# Patient Record
Sex: Male | Born: 1955
Health system: Southern US, Community
[De-identification: ages and names within clinical notes are randomized; demographics above are authoritative.]

## PROBLEM LIST (undated history)

## (undated) DIAGNOSIS — C679 Malignant neoplasm of bladder, unspecified: Secondary | ICD-10-CM

## (undated) DIAGNOSIS — Z87898 Personal history of other specified conditions: Secondary | ICD-10-CM

## (undated) DIAGNOSIS — I77810 Thoracic aortic ectasia: Secondary | ICD-10-CM

## (undated) DIAGNOSIS — I255 Ischemic cardiomyopathy: Secondary | ICD-10-CM

## (undated) DIAGNOSIS — I456 Pre-excitation syndrome: Secondary | ICD-10-CM

## (undated) DIAGNOSIS — T4145XA Adverse effect of unspecified anesthetic, initial encounter: Secondary | ICD-10-CM

## (undated) DIAGNOSIS — T7840XA Allergy, unspecified, initial encounter: Secondary | ICD-10-CM

## (undated) DIAGNOSIS — R972 Elevated prostate specific antigen [PSA]: Secondary | ICD-10-CM

## (undated) DIAGNOSIS — Z8709 Personal history of other diseases of the respiratory system: Secondary | ICD-10-CM

## (undated) DIAGNOSIS — R51 Headache: Secondary | ICD-10-CM

## (undated) DIAGNOSIS — N400 Enlarged prostate without lower urinary tract symptoms: Secondary | ICD-10-CM

## (undated) DIAGNOSIS — I219 Acute myocardial infarction, unspecified: Secondary | ICD-10-CM

## (undated) DIAGNOSIS — K219 Gastro-esophageal reflux disease without esophagitis: Secondary | ICD-10-CM

## (undated) DIAGNOSIS — E785 Hyperlipidemia, unspecified: Secondary | ICD-10-CM

## (undated) DIAGNOSIS — N390 Urinary tract infection, site not specified: Secondary | ICD-10-CM

## (undated) DIAGNOSIS — I2510 Atherosclerotic heart disease of native coronary artery without angina pectoris: Secondary | ICD-10-CM

## (undated) DIAGNOSIS — I82409 Acute embolism and thrombosis of unspecified deep veins of unspecified lower extremity: Secondary | ICD-10-CM

## (undated) DIAGNOSIS — E119 Type 2 diabetes mellitus without complications: Secondary | ICD-10-CM

## (undated) DIAGNOSIS — M1990 Unspecified osteoarthritis, unspecified site: Secondary | ICD-10-CM

## (undated) DIAGNOSIS — Z720 Tobacco use: Secondary | ICD-10-CM

## (undated) DIAGNOSIS — D696 Thrombocytopenia, unspecified: Secondary | ICD-10-CM

## (undated) DIAGNOSIS — I10 Essential (primary) hypertension: Secondary | ICD-10-CM

## (undated) HISTORY — PX: VASECTOMY: SHX75

## (undated) HISTORY — DX: Thrombocytopenia, unspecified: D69.6

## (undated) HISTORY — DX: Allergy, unspecified, initial encounter: T78.40XA

## (undated) HISTORY — PX: BLADDER TUMOR EXCISION: SHX238

## (undated) HISTORY — PX: TRANSTHORACIC ECHOCARDIOGRAM: SHX275

## (undated) HISTORY — PX: LUMBAR SPINE SURGERY: SHX701

## (undated) HISTORY — DX: Tobacco use: Z72.0

## (undated) HISTORY — PX: COLONOSCOPY: SHX174

## (undated) HISTORY — DX: Benign prostatic hyperplasia without lower urinary tract symptoms: N40.0

## (undated) HISTORY — DX: Elevated prostate specific antigen (PSA): R97.20

## (undated) HISTORY — DX: Pre-excitation syndrome: I45.6

## (undated) HISTORY — DX: Malignant neoplasm of bladder, unspecified: C67.9

## (undated) HISTORY — DX: Type 2 diabetes mellitus without complications: E11.9

## (undated) HISTORY — DX: Essential (primary) hypertension: I10

## (undated) HISTORY — DX: Acute embolism and thrombosis of unspecified deep veins of unspecified lower extremity: I82.409

## (undated) HISTORY — DX: Hyperlipidemia, unspecified: E78.5

## (undated) HISTORY — DX: Atherosclerotic heart disease of native coronary artery without angina pectoris: I25.10

---

## 1999-08-09 ENCOUNTER — Inpatient Hospital Stay (HOSPITAL_COMMUNITY): Admission: EM | Admit: 1999-08-09 | Discharge: 1999-08-10 | Payer: Self-pay | Admitting: Emergency Medicine

## 1999-08-09 ENCOUNTER — Encounter: Payer: Self-pay | Admitting: *Deleted

## 1999-08-10 ENCOUNTER — Encounter: Payer: Self-pay | Admitting: Cardiology

## 2002-04-02 ENCOUNTER — Encounter: Payer: Self-pay | Admitting: Orthopedic Surgery

## 2002-04-02 ENCOUNTER — Encounter: Admission: RE | Admit: 2002-04-02 | Discharge: 2002-04-02 | Payer: Self-pay | Admitting: Orthopedic Surgery

## 2003-07-26 HISTORY — PX: CARDIAC CATHETERIZATION: SHX172

## 2004-02-28 ENCOUNTER — Inpatient Hospital Stay (HOSPITAL_COMMUNITY): Admission: EM | Admit: 2004-02-28 | Discharge: 2004-03-01 | Payer: Self-pay | Admitting: Emergency Medicine

## 2004-06-02 ENCOUNTER — Ambulatory Visit: Payer: Self-pay | Admitting: Cardiology

## 2004-06-10 ENCOUNTER — Ambulatory Visit: Payer: Self-pay

## 2004-09-28 ENCOUNTER — Ambulatory Visit: Payer: Self-pay | Admitting: Cardiology

## 2005-02-24 ENCOUNTER — Ambulatory Visit: Payer: Self-pay | Admitting: Cardiology

## 2005-03-01 ENCOUNTER — Ambulatory Visit: Payer: Self-pay | Admitting: Cardiology

## 2005-03-08 ENCOUNTER — Ambulatory Visit: Payer: Self-pay

## 2005-05-11 ENCOUNTER — Ambulatory Visit: Payer: Self-pay | Admitting: Cardiology

## 2005-10-26 ENCOUNTER — Ambulatory Visit: Payer: Self-pay | Admitting: Cardiology

## 2005-11-04 ENCOUNTER — Ambulatory Visit: Payer: Self-pay

## 2006-08-21 ENCOUNTER — Ambulatory Visit: Payer: Self-pay | Admitting: Cardiology

## 2006-08-21 LAB — CONVERTED CEMR LAB
ALT: 34 units/L (ref 0–40)
AST: 26 units/L (ref 0–37)
Albumin: 4.3 g/dL (ref 3.5–5.2)
Alkaline Phosphatase: 47 units/L (ref 39–117)
BUN: 20 mg/dL (ref 6–23)
CO2: 27 meq/L (ref 19–32)
Calcium: 9.4 mg/dL (ref 8.4–10.5)
Chloride: 106 meq/L (ref 96–112)
Cholesterol: 106 mg/dL (ref 0–200)
Creatinine, Ser: 1.2 mg/dL (ref 0.4–1.5)
GFR calc Af Amer: 82 mL/min
GFR calc non Af Amer: 68 mL/min
Glucose, Bld: 129 mg/dL — ABNORMAL HIGH (ref 70–99)
HDL: 26.8 mg/dL — ABNORMAL LOW (ref >39.0)
LDL Cholesterol: 50 mg/dL (ref 0–99)
Potassium: 4.1 meq/L (ref 3.5–5.1)
Sodium: 138 meq/L (ref 135–145)
Total Bilirubin: 0.6 mg/dL (ref 0.3–1.2)
Total CHOL/HDL Ratio: 4
Total Protein: 7 g/dL (ref 6.0–8.3)
Triglycerides: 147 mg/dL (ref 0–149)
VLDL: 29 mg/dL (ref 0–40)

## 2007-07-26 HISTORY — PX: OTHER SURGICAL HISTORY: SHX169

## 2007-09-04 ENCOUNTER — Ambulatory Visit: Payer: Self-pay | Admitting: Cardiology

## 2007-09-12 ENCOUNTER — Ambulatory Visit: Payer: Self-pay

## 2007-09-12 LAB — CONVERTED CEMR LAB
ALT: 39 units/L (ref 0–53)
AST: 27 units/L (ref 0–37)
Albumin: 4.2 g/dL (ref 3.5–5.2)
Alkaline Phosphatase: 47 units/L (ref 39–117)
BUN: 16 mg/dL (ref 6–23)
Bilirubin, Direct: 0.2 mg/dL (ref 0.0–0.3)
CO2: 25 meq/L (ref 19–32)
Calcium: 9.7 mg/dL (ref 8.4–10.5)
Chloride: 103 meq/L (ref 96–112)
Cholesterol: 117 mg/dL (ref 0–200)
Creatinine, Ser: 1.1 mg/dL (ref 0.4–1.5)
GFR calc Af Amer: 90 mL/min
GFR calc non Af Amer: 75 mL/min
Glucose, Bld: 156 mg/dL — ABNORMAL HIGH (ref 70–99)
HDL: 25.8 mg/dL — ABNORMAL LOW (ref >39.0)
LDL Cholesterol: 52 mg/dL (ref 0–99)
Potassium: 4.2 meq/L (ref 3.5–5.1)
Sodium: 137 meq/L (ref 135–145)
Total Bilirubin: 1.7 mg/dL — ABNORMAL HIGH (ref 0.3–1.2)
Total CHOL/HDL Ratio: 4.5
Total Protein: 6.9 g/dL (ref 6.0–8.3)
Triglycerides: 196 mg/dL — ABNORMAL HIGH (ref 0–149)
VLDL: 39 mg/dL (ref 0–40)

## 2008-07-25 HISTORY — PX: COLONOSCOPY: SHX174

## 2008-09-08 ENCOUNTER — Ambulatory Visit: Payer: Self-pay | Admitting: Cardiology

## 2008-09-08 LAB — CONVERTED CEMR LAB
ALT: 22 units/L (ref 0–53)
AST: 17 units/L (ref 0–37)
Albumin: 4 g/dL (ref 3.5–5.2)
Alkaline Phosphatase: 46 units/L (ref 39–117)
BUN: 16 mg/dL (ref 6–23)
Bilirubin, Direct: 0.1 mg/dL (ref 0.0–0.3)
CO2: 26 meq/L (ref 19–32)
Calcium: 10 mg/dL (ref 8.4–10.5)
Chloride: 97 meq/L (ref 96–112)
Cholesterol: 113 mg/dL (ref 0–200)
Creatinine, Ser: 1 mg/dL (ref 0.4–1.5)
GFR calc Af Amer: 101 mL/min
GFR calc non Af Amer: 83 mL/min
Glucose, Bld: 293 mg/dL — ABNORMAL HIGH (ref 70–99)
HDL: 27.1 mg/dL — ABNORMAL LOW (ref >39.0)
LDL Cholesterol: 63 mg/dL (ref 0–99)
Potassium: 4.6 meq/L (ref 3.5–5.1)
Sodium: 135 meq/L (ref 135–145)
Total Bilirubin: 0.7 mg/dL (ref 0.3–1.2)
Total CHOL/HDL Ratio: 4.2
Total Protein: 7.3 g/dL (ref 6.0–8.3)
Triglycerides: 114 mg/dL (ref 0–149)
VLDL: 23 mg/dL (ref 0–40)

## 2008-09-11 DIAGNOSIS — I2510 Atherosclerotic heart disease of native coronary artery without angina pectoris: Secondary | ICD-10-CM | POA: Insufficient documentation

## 2008-09-11 DIAGNOSIS — E785 Hyperlipidemia, unspecified: Secondary | ICD-10-CM | POA: Insufficient documentation

## 2008-09-11 DIAGNOSIS — I10 Essential (primary) hypertension: Secondary | ICD-10-CM | POA: Insufficient documentation

## 2008-09-11 DIAGNOSIS — F17200 Nicotine dependence, unspecified, uncomplicated: Secondary | ICD-10-CM | POA: Insufficient documentation

## 2008-09-11 DIAGNOSIS — E7800 Pure hypercholesterolemia, unspecified: Secondary | ICD-10-CM | POA: Insufficient documentation

## 2009-07-21 ENCOUNTER — Encounter: Payer: Self-pay | Admitting: Cardiology

## 2009-10-07 ENCOUNTER — Ambulatory Visit: Payer: Self-pay | Admitting: Cardiology

## 2010-02-01 ENCOUNTER — Encounter (INDEPENDENT_AMBULATORY_CARE_PROVIDER_SITE_OTHER): Payer: Self-pay | Admitting: *Deleted

## 2010-02-15 ENCOUNTER — Telehealth: Payer: Self-pay | Admitting: Cardiology

## 2010-02-18 ENCOUNTER — Ambulatory Visit: Payer: Self-pay | Admitting: Cardiology

## 2010-02-19 ENCOUNTER — Encounter (INDEPENDENT_AMBULATORY_CARE_PROVIDER_SITE_OTHER): Payer: Self-pay | Admitting: *Deleted

## 2010-02-19 LAB — CONVERTED CEMR LAB
ALT: 26 units/L (ref 0–53)
AST: 23 units/L (ref 0–37)
Albumin: 4.4 g/dL (ref 3.5–5.2)
Alkaline Phosphatase: 53 units/L (ref 39–117)
BUN: 18 mg/dL (ref 6–23)
Bilirubin, Direct: 0.2 mg/dL (ref 0.0–0.3)
CO2: 23 meq/L (ref 19–32)
Calcium: 8.9 mg/dL (ref 8.4–10.5)
Chloride: 100 meq/L (ref 96–112)
Cholesterol: 139 mg/dL (ref 0–200)
Creatinine, Ser: 1 mg/dL (ref 0.4–1.5)
GFR calc non Af Amer: 81.6 mL/min (ref >60)
Glucose, Bld: 125 mg/dL — ABNORMAL HIGH (ref 70–99)
HDL: 28.9 mg/dL — ABNORMAL LOW (ref >39.00)
LDL Cholesterol: 78 mg/dL (ref 0–99)
Potassium: 4.2 meq/L (ref 3.5–5.1)
Sodium: 138 meq/L (ref 135–145)
Total Bilirubin: 1 mg/dL (ref 0.3–1.2)
Total CHOL/HDL Ratio: 5
Total Protein: 6.9 g/dL (ref 6.0–8.3)
Triglycerides: 160 mg/dL — ABNORMAL HIGH (ref 0.0–149.0)
VLDL: 32 mg/dL (ref 0.0–40.0)

## 2010-06-05 ENCOUNTER — Encounter: Payer: Self-pay | Admitting: Cardiology

## 2010-07-25 DIAGNOSIS — C679 Malignant neoplasm of bladder, unspecified: Secondary | ICD-10-CM

## 2010-07-25 HISTORY — DX: Malignant neoplasm of bladder, unspecified: C67.9

## 2010-07-25 HISTORY — PX: OTHER SURGICAL HISTORY: SHX169

## 2010-08-16 ENCOUNTER — Encounter: Payer: Self-pay | Admitting: Cardiology

## 2010-08-24 NOTE — Progress Notes (Signed)
Summary: calling regarding labs   Phone Note Call from Patient Call back at Work Phone [phone]   Caller: Patient Summary of Call: Pt calling regarding labs Initial call taken by: Judie Grieve,  February 15, 2010 11:18 AM  Follow-up for Phone Call        spoke with pt, he will have done this week Deliah Goody, RN  February 15, 2010 11:55 AM

## 2010-08-24 NOTE — Letter (Signed)
Summary: Custom - Lipid  [facility], Main Office  [address]   [place], [place] [address]   Phone: [phone]  Fax: [phone]     February 19, 2010 MRN: [id number]   Mark Blankenship [address] [place], [place]  [address]   Dear Mr. Howdeshell,  We have reviewed your cholesterol results.  They are as follows:     Total Cholesterol:    139 (Desirable: less than 200)       HDL  Cholesterol:     28.90  (Desirable: greater than 40 for men and 50 for women)       LDL Cholesterol:       78  (Desirable: less than 100 for low risk and less than 70 for moderate to high risk)       Triglycerides:       160.0  (Desirable: less than 150)  Our recommendations include:These numbers look good. Continue on the same medicine. Sodium, potassium, kidney and Liver function is normal. Take care, Dr. Darel Hong.THANK YOU SO MUCH FOR THE VEGETABLES, THEY WERE GREAT!    Call our office at the number listed above if you have any questions.  Lowering your LDL cholesterol is important, but it is only one of a large number of "risk factors" that may indicate that you are at risk for heart disease, stroke or other complications of hardening of the arteries.  Other risk factors include:   A.  Cigarette Smoking* B.  High Blood Pressure* C.  Obesity* D.   Low HDL Cholesterol (see yours above)* E.   Diabetes Mellitus (higher risk if your is uncontrolled) F.  Family history of premature heart disease G.  Previous history of stroke or cardiovascular disease    *These are risk factors YOU HAVE CONTROL OVER.  For more information, visit .  There is now evidence that lowering the TOTAL CHOLESTEROL AND LDL CHOLESTEROL can reduce the risk of heart disease.  The American Heart Association recommends the following guidelines for the treatment of elevated cholesterol:  1.  If there is now current heart disease and less than two risk factors, TOTAL CHOLESTEROL should be less than 200 and  LDL CHOLESTEROL should be less than 100. 2.  If there is current heart disease or two or more risk factors, TOTAL CHOLESTEROL should be less than 200 and LDL CHOLESTEROL should be less than 70.  A diet low in cholesterol, saturated fat, and calories is the cornerstone of treatment for elevated cholesterol.  Cessation of smoking and exercise are also important in the management of elevated cholesterol and preventing vascular disease.  Studies have shown that 30 to 60 minutes of physical activity most days can help lower blood pressure, lower cholesterol, and keep your weight at a healthy level.  Drug therapy is used when cholesterol levels do not respond to therapeutic lifestyle changes (smoking cessation, diet, and exercise) and remains unacceptably high.  If medication is started, it is important to have you levels checked periodically to evaluate the need for further treatment options.  Thank you,    [facility] Team

## 2010-08-24 NOTE — Letter (Signed)
Summary: Lab Test Reminder  [facility], Main Office  [address]   [place], [place] [address]   Phone: [phone]  Fax: [phone]     February 01, 2010 MRN: [id number]   Mark Blankenship [address] [place], [place]  [address]   Dear [name],  During your last appointment, Dr.  [name]  requested you have some follow-up blood work.  Our records indicate you have not had this done.  Please have this blood work done as soon as possible.  It is important that patients and their doctor work together in the management/treatment of their health care.   If you have already had your blood work drawn, please call and let me know so I can get the results.  Thanks, Debra,RN [facility]

## 2010-08-24 NOTE — Assessment & Plan Note (Signed)
Summary: yearly/sl  Medications Added METFORMIN HCL 1000 MG TABS (METFORMIN HCL) 1 tab by mouth two times a day TAMSULOSIN HCL 0.4 MG CAPS (TAMSULOSIN HCL) 1 tab op once daily SIMCOR 1000-20 MG XR24H-TAB (NIACIN-SIMVASTATIN) Take one tablet by mouth daily at bedtime SIMVASTATIN 40 MG TABS (SIMVASTATIN) Take one tablet by mouth daily at bedtime        CC:  yearly visit.  History of Present Illness: Mark Blankenship is a pleasant gentleman who has a history of coronary disease status post drug-eluting stent to the circumflex in August 2005. His last Myoview was performed on September 12, 2007.  The patient's ejection fraction was 52%.  There is mild inferior thinning, but no sign of scar or ischemia. I last saw him in Feb of 2010. Since then the patient denies any dyspnea on exertion, orthopnea, PND, pedal edema, palpitations, syncope or chest pain.   Current Medications (verified): 1)  Aspirin 81 Mg Tbec (Aspirin) .... Take One Tablet By Mouth Daily 2)  Lisinopril 20 Mg Tabs (Lisinopril) .... Once Daily 3)  Metformin Hcl 1000 Mg Tabs (Metformin Hcl) .Marland Kitchen.. 1 Tab By Mouth Two Times A Day 4)  Plavix 75 Mg Tabs (Clopidogrel Bisulfate) .... Once Daily 5)  Niaspan 750 Mg Cr-Tabs (Niacin (Antihyperlipidemic)) .... Once Daily 6)  Atenolol 50 Mg Tabs (Atenolol) .... Once Daily 7)  Tamsulosin Hcl 0.4 Mg Caps (Tamsulosin Hcl) .Marland Kitchen.. 1 Tab Op Once Daily 8)  Vytorin 10-40 Mg Tabs (Ezetimibe-Simvastatin) .Marland Kitchen.. 1 Tab By Mouth Once Daily  Past History:  Past Medical History: Reviewed history from 09/11/2008 and no changes required. CAD Diabetes Type 2 Hyperlipidemia Hypertension tobacco abuse arthritis H/O intolerance to lipitor  Past Surgical History: Reviewed history from 09/11/2008 and no changes required. Back surgery  Social History: Reviewed history from 09/11/2008 and no changes required. Tobacco Use  Alcohol Use - no  Review of Systems       Some arthralgias but no fevers or chills,  productive cough, hemoptysis, dysphasia, odynophagia, melena, hematochezia, dysuria, hematuria, rash, seizure activity, orthopnea, PND, pedal edema, claudication. Remaining systems are negative.   Vital Signs:  Patient profile:   55 year old male Height:      67 inches Weight:      190 pounds BMI:     29.87 Pulse rate:   62 / minute Resp:     12 per minute BP sitting:   115 / 78  (left arm)  Vitals Entered By: Kem Parkinson (October 07, 2009 9:11 AM)  Physical Exam  General:  Well-developed well-nourished in no acute distress.  Skin is warm and dry.  HEENT is normal.  Neck is supple. No thyromegaly.  Chest is clear to auscultation with normal expansion.  Cardiovascular exam is regular rate and rhythm.  Abdominal exam nontender or distended. No masses palpated. Extremities show no edema. neuro grossly intact    EKG  Procedure date:  10/07/2009  Findings:      Sinus rhythm at a rate of 62. No ST changes.  Impression & Recommendations:  Problem # 1:  TOBACCO ABUSE (ICD-305.1) Patient counseled on discontinuing between 3-10 minutes.  Problem # 2:  CAD, NATIVE VESSEL (ICD-414.01) Continue aspirin but discontinued Plavix. Continue ACE inhibitor, beta blocker and statin. The following medications were removed from the medication list:    Plavix 75 Mg Tabs (Clopidogrel bisulfate) ..... Once daily His updated medication list for this problem includes:    Aspirin 81 Mg Tbec (Aspirin) .Marland Kitchen... Take one tablet by mouth  daily    Lisinopril 20 Mg Tabs (Lisinopril) ..... Once daily    Atenolol 50 Mg Tabs (Atenolol) ..... Once daily  Problem # 3:  HYPERTENSION, BENIGN (ICD-401.1) Blood pressure controlled on present medications. Will continue. Check bmet. His updated medication list for this problem includes:    Aspirin 81 Mg Tbec (Aspirin) .Marland Kitchen... Take one tablet by mouth daily    Lisinopril 20 Mg Tabs (Lisinopril) ..... Once daily    Atenolol 50 Mg Tabs (Atenolol) ..... Once  daily  Problem # 4:  HYPERCHOLESTEROLEMIA, PURE (ICD-272.0) Discontinue Vytorin secondary to expense. Begin Zocor 40 mg p.o. daily. Check lipids and liver 6 weeks later. His updated medication list for this problem includes:    Niaspan 750 Mg Cr-tabs (Niacin (antihyperlipidemic)) ..... Once daily    Simvastatin 40 Mg Tabs (Simvastatin) .Marland Kitchen... Take one tablet by mouth daily at bedtime  His updated medication list for this problem includes:    Niaspan 750 Mg Cr-tabs (Niacin (antihyperlipidemic)) ..... Once daily simvastatin 40 mg p.o. daily  Problem # 5:  DIABETES MELLITUS, TYPE II, CONTROLLED (ICD-250.00)  His updated medication list for this problem includes:    Aspirin 81 Mg Tbec (Aspirin) .Marland Kitchen... Take one tablet by mouth daily    Lisinopril 20 Mg Tabs (Lisinopril) ..... Once daily    Metformin Hcl 1000 Mg Tabs (Metformin hcl) .Marland Kitchen... 1 tab by mouth two times a day  His updated medication list for this problem includes:    Aspirin 81 Mg Tbec (Aspirin) .Marland Kitchen... Take one tablet by mouth daily    Lisinopril 20 Mg Tabs (Lisinopril) ..... Once daily    Metformin Hcl 1000 Mg Tabs (Metformin hcl) .Marland Kitchen... 1 tab by mouth two times a day  Patient Instructions: 1)  Your physician recommends that you schedule a follow-up appointment in: ONE YEAR 2)  Your physician recommends that you return for lab work in: 6 WEEKS 3)  Your physician has recommended you make the following change in your medication: STOP PLAVIX 4)  START SIMVASTATIN 40MG  ONCE DAILY Prescriptions: SIMVASTATIN 40 MG TABS (SIMVASTATIN) Take one tablet by mouth daily at bedtime  #30 x 12   Entered by:   Deliah Goody, RN   Authorized by:   Ferman Hamming, MD, [facility]   Signed by:   Deliah Goody, RN on 10/07/2009   Method used:   Faxed to ...       [facility] (retail)       [address]       [place], [place]  [address]       Ph: [phone]       Fax: [phone]   RxID:   [id number] SIMCOR 1000-20 MG XR24H-TAB  (NIACIN-SIMVASTATIN) Take one tablet by mouth daily at bedtime  #30 x 12   Entered by:   Deliah Goody, RN   Authorized by:   Ferman Hamming, MD, [facility]   Signed by:   Deliah Goody, RN on 10/07/2009   Method used:   Faxed to ...       [facility] (retail)       [address]       [place], [place]  [address]       Ph: [phone]       Fax: [phone]   RxID:   [id number]

## 2010-08-25 ENCOUNTER — Encounter: Payer: Self-pay | Admitting: Cardiology

## 2010-08-25 ENCOUNTER — Ambulatory Visit (HOSPITAL_COMMUNITY)
Admission: RE | Admit: 2010-08-25 | Discharge: 2010-08-25 | Disposition: A | Discharge status: Home / Self Care | Payer: Managed Care, Other (non HMO) | Source: Ambulatory Visit | Attending: Urology | Admitting: Urology

## 2010-08-25 ENCOUNTER — Ambulatory Visit (HOSPITAL_COMMUNITY): Payer: Managed Care, Other (non HMO)

## 2010-08-25 ENCOUNTER — Other Ambulatory Visit: Payer: Self-pay | Admitting: Urology

## 2010-08-25 DIAGNOSIS — J984 Other disorders of lung: Secondary | ICD-10-CM | POA: Insufficient documentation

## 2010-08-25 DIAGNOSIS — J449 Chronic obstructive pulmonary disease, unspecified: Secondary | ICD-10-CM | POA: Insufficient documentation

## 2010-08-25 DIAGNOSIS — Z01818 Encounter for other preprocedural examination: Secondary | ICD-10-CM

## 2010-08-25 DIAGNOSIS — I10 Essential (primary) hypertension: Secondary | ICD-10-CM | POA: Insufficient documentation

## 2010-08-25 DIAGNOSIS — F17200 Nicotine dependence, unspecified, uncomplicated: Secondary | ICD-10-CM | POA: Insufficient documentation

## 2010-08-25 DIAGNOSIS — J4489 Other specified chronic obstructive pulmonary disease: Secondary | ICD-10-CM | POA: Insufficient documentation

## 2010-08-25 LAB — BASIC METABOLIC PANEL
CO2: 26 mEq/L (ref 19–32)
Chloride: 104 mEq/L (ref 96–112)
GFR calc Af Amer: >60 mL/min (ref >60)
Sodium: 138 mEq/L (ref 135–145)

## 2010-08-25 LAB — CBC
Hemoglobin: 17.5 g/dL — ABNORMAL HIGH (ref 13.0–17.0)
MCH: 31.6 pg (ref 26.0–34.0)
RBC: 5.54 MIL/uL (ref 4.22–5.81)

## 2010-08-25 LAB — SURGICAL PCR SCREEN: MRSA, PCR: NEGATIVE

## 2010-08-27 ENCOUNTER — Ambulatory Visit (HOSPITAL_COMMUNITY)
Admission: RE | Admit: 2010-08-27 | Discharge: 2010-08-27 | Disposition: A | Discharge status: Home / Self Care | Payer: Managed Care, Other (non HMO) | Attending: Urology | Admitting: Urology

## 2010-08-27 ENCOUNTER — Encounter: Payer: Self-pay | Admitting: Cardiology

## 2010-08-27 DIAGNOSIS — R319 Hematuria, unspecified: Secondary | ICD-10-CM | POA: Insufficient documentation

## 2010-09-03 ENCOUNTER — Encounter: Payer: Self-pay | Admitting: Cardiology

## 2010-09-03 ENCOUNTER — Ambulatory Visit (INDEPENDENT_AMBULATORY_CARE_PROVIDER_SITE_OTHER): Payer: Managed Care, Other (non HMO) | Admitting: Cardiology

## 2010-09-03 ENCOUNTER — Other Ambulatory Visit: Payer: Self-pay | Admitting: Cardiology

## 2010-09-03 DIAGNOSIS — E785 Hyperlipidemia, unspecified: Secondary | ICD-10-CM

## 2010-09-03 DIAGNOSIS — E7800 Pure hypercholesterolemia, unspecified: Secondary | ICD-10-CM

## 2010-09-03 DIAGNOSIS — I2510 Atherosclerotic heart disease of native coronary artery without angina pectoris: Secondary | ICD-10-CM

## 2010-09-03 DIAGNOSIS — I10 Essential (primary) hypertension: Secondary | ICD-10-CM

## 2010-09-03 DIAGNOSIS — Z01810 Encounter for preprocedural cardiovascular examination: Secondary | ICD-10-CM

## 2010-09-03 LAB — LIPID PANEL
HDL: 26.6 mg/dL — ABNORMAL LOW (ref >39.00)
Triglycerides: 231 mg/dL — ABNORMAL HIGH (ref 0.0–149.0)
VLDL: 46.2 mg/dL — ABNORMAL HIGH (ref 0.0–40.0)

## 2010-09-03 LAB — BASIC METABOLIC PANEL
CO2: 29 mEq/L (ref 19–32)
Glucose, Bld: 139 mg/dL — ABNORMAL HIGH (ref 70–99)
Potassium: 4.3 mEq/L (ref 3.5–5.1)
Sodium: 137 mEq/L (ref 135–145)

## 2010-09-03 LAB — HEPATIC FUNCTION PANEL
ALT: 32 U/L (ref 0–53)
Bilirubin, Direct: 0.2 mg/dL (ref 0.0–0.3)
Total Bilirubin: 1.1 mg/dL (ref 0.3–1.2)
Total Protein: 7.2 g/dL (ref 6.0–8.3)

## 2010-09-03 LAB — LDL CHOLESTEROL, DIRECT: Direct LDL: 87.2 mg/dL

## 2010-09-03 LAB — CK: Total CK: 64 U/L (ref 7–232)

## 2010-09-09 ENCOUNTER — Ambulatory Visit: Payer: Self-pay | Admitting: Cardiology

## 2010-09-09 NOTE — Assessment & Plan Note (Signed)
Summary: F1Y/DM  Medications Added DOXYCYCLINE HYCLATE 100 MG TABS (DOXYCYCLINE HYCLATE) 2 daily FLONASE 50 MCG/ACT SUSP (FLUTICASONE PROPIONATE) as needed        CC:  surgical clearnce.  History of Present Illness: Mark Blankenship is a pleasant gentleman who has a history of coronary disease status post drug-eluting stent to the circumflex in August 2005. His last Myoview was performed on September 12, 2007.  The patient's ejection fraction was 52%.  There is mild inferior thinning, but no sign of scar or ischemia. I last saw him in March of 2011. Since then the patient denies any dyspnea on exertion, orthopnea, PND, pedal edema, palpitations, syncope. The patient had brief pain in the lateral aspect of his chest approximately 15 minutes ago. The pain was described as throbbing. It resolved spontaneously. It did not radiate and there were no associated symptoms. He has not had chest pain since then and has not had exertional chest pain.Note, he is scheduled for urological surgery.  Current Medications (verified): 1)  Aspirin 81 Mg Tbec (Aspirin) .... Take One Tablet By Mouth Daily 2)  Lisinopril 20 Mg Tabs (Lisinopril) .... Once Daily 3)  Metformin Hcl 1000 Mg Tabs (Metformin Hcl) .Marland Kitchen.. 1 Tab By Mouth Two Times A Day 4)  Niaspan 750 Mg Cr-Tabs (Niacin (Antihyperlipidemic)) .... Once Daily 5)  Atenolol 50 Mg Tabs (Atenolol) .... Once Daily 6)  Tamsulosin Hcl 0.4 Mg Caps (Tamsulosin Hcl) .Marland Kitchen.. 1 Tab Op Once Daily 7)  Simvastatin 40 Mg Tabs (Simvastatin) .... Take One Tablet By Mouth Daily At Bedtime 8)  Doxycycline Hyclate 100 Mg Tabs (Doxycycline Hyclate) .... 2 Daily 9)  Flonase 50 Mcg/act Susp (Fluticasone Propionate) .... As Needed  Past History:  Past Medical History: CAD Diabetes Type 2 Hyperlipidemia Hypertension arthritis H/O intolerance to lipitor  Past Surgical History: Reviewed history from 09/11/2008 and no changes required. Back surgery  Social History: Reviewed history  from 10/07/2009 and no changes required. Tobacco Use yes Alcohol Use - no  Review of Systems       no fevers or chills, productive cough, hemoptysis, dysphasia, odynophagia, melena, hematochezia, dysuria, hematuria, rash, seizure activity, orthopnea, PND, pedal edema, claudication. Remaining systems are negative.   Vital Signs:  Patient profile:   55 year old male Height:      67 inches Weight:      192 pounds BMI:     30.18 Pulse rate:   68 / minute Resp:     12 per minute BP sitting:   140 / 80  (left arm)  Vitals Entered By: Kem Parkinson (September 03, 2010 11:03 AM)  Physical Exam  General:  Well-developed well-nourished in no acute distress.  Skin is warm and dry.  HEENT is normal.  Neck is supple. No thyromegaly.  Chest is clear to auscultation with normal expansion.  Cardiovascular exam is regular rate and rhythm.  Abdominal exam nontender or distended. No masses palpated. Extremities show no edema. neuro grossly intact    EKG  Procedure date:  08/25/2010  Findings:      Sus rhythm with no ST changes.  Impression & Recommendations:  Problem # 1:  PREOPERATIVE EXAMINATION (ICD-V72.84) Schedule Myoview for risk stratification.  Problem # 2:  TOBACCO ABUSE (ICD-305.1) Patient counseled on discontinuing.  Problem # 3:  DIABETES MELLITUS, TYPE II, CONTROLLED (ICD-250.00)  His updated medication list for this problem includes:    Aspirin 81 Mg Tbec (Aspirin) .Marland Kitchen... Take one tablet by mouth daily    Lisinopril 20 Mg  Tabs (Lisinopril) ..... Once daily    Metformin Hcl 1000 Mg Tabs (Metformin hcl) .Marland Kitchen... 1 tab by mouth two times a day  Problem # 4:  HYPERCHOLESTEROLEMIA, PURE (ICD-272.0) Continue present medications. Check lipids, liver and CK. His updated medication list for this problem includes:    Niaspan 750 Mg Cr-tabs (Niacin (antihyperlipidemic)) ..... Once daily    Simvastatin 40 Mg Tabs (Simvastatin) .Marland Kitchen... Take one tablet by mouth daily at  bedtime  Orders: TLB-Lipid Panel (80061-LIPID) TLB-Hepatic/Liver Function Pnl (80076-HEPATIC) TLB-CK Total Only(Creatine Kinase/CPK) (82550-CK)  Problem # 5:  HYPERTENSION, BENIGN (ICD-401.1)  Blood pressure controlled. Continue present medications. Check bmet. His updated medication list for this problem includes:    Aspirin 81 Mg Tbec (Aspirin) .Marland Kitchen... Take one tablet by mouth daily    Lisinopril 20 Mg Tabs (Lisinopril) ..... Once daily    Atenolol 50 Mg Tabs (Atenolol) ..... Once daily  Orders: TLB-BMP (Basic Metabolic Panel-BMET) (80048-METABOL)  His updated medication list for this problem includes:    Aspirin 81 Mg Tbec (Aspirin) .Marland Kitchen... Take one tablet by mouth daily    Lisinopril 20 Mg Tabs (Lisinopril) ..... Once daily    Atenolol 50 Mg Tabs (Atenolol) ..... Once daily  Problem # 6:  CAD, NATIVE VESSEL (ICD-414.01) Continue aspirin, ACE inhibitor, beta blocker and statin. His updated medication list for this problem includes:    Aspirin 81 Mg Tbec (Aspirin) .Marland Kitchen... Take one tablet by mouth daily    Lisinopril 20 Mg Tabs (Lisinopril) ..... Once daily    Atenolol 50 Mg Tabs (Atenolol) ..... Once daily  Orders: Nuclear Stress Test (Nuc Stress Test)  Patient Instructions: 1)  Your physician wants you to follow-up in: ONE YEAR  You will receive a reminder letter in the mail two months in advance. If you don't receive a letter, please call our office to schedule the follow-up appointment. 2)  Your physician has requested that you have an LEXISCAN stress myoview.  For further information please visit [email or web address]  Please follow instruction sheet, as given.

## 2010-09-13 ENCOUNTER — Telehealth (INDEPENDENT_AMBULATORY_CARE_PROVIDER_SITE_OTHER): Payer: Self-pay | Admitting: *Deleted

## 2010-09-14 ENCOUNTER — Ambulatory Visit (HOSPITAL_COMMUNITY): Payer: Managed Care, Other (non HMO) | Attending: Cardiology

## 2010-09-14 ENCOUNTER — Encounter (INDEPENDENT_AMBULATORY_CARE_PROVIDER_SITE_OTHER): Payer: Self-pay | Admitting: *Deleted

## 2010-09-14 ENCOUNTER — Encounter: Payer: Self-pay | Admitting: Cardiology

## 2010-09-14 ENCOUNTER — Encounter: Payer: Self-pay | Admitting: *Deleted

## 2010-09-14 DIAGNOSIS — R0602 Shortness of breath: Secondary | ICD-10-CM

## 2010-09-14 DIAGNOSIS — R0789 Other chest pain: Secondary | ICD-10-CM

## 2010-09-14 DIAGNOSIS — I2510 Atherosclerotic heart disease of native coronary artery without angina pectoris: Secondary | ICD-10-CM

## 2010-09-14 DIAGNOSIS — I10 Essential (primary) hypertension: Secondary | ICD-10-CM | POA: Insufficient documentation

## 2010-09-14 DIAGNOSIS — R079 Chest pain, unspecified: Secondary | ICD-10-CM

## 2010-09-14 DIAGNOSIS — E7800 Pure hypercholesterolemia, unspecified: Secondary | ICD-10-CM | POA: Insufficient documentation

## 2010-09-14 DIAGNOSIS — E119 Type 2 diabetes mellitus without complications: Secondary | ICD-10-CM

## 2010-09-15 ENCOUNTER — Encounter (INDEPENDENT_AMBULATORY_CARE_PROVIDER_SITE_OTHER): Payer: Self-pay | Admitting: *Deleted

## 2010-09-21 NOTE — Assessment & Plan Note (Signed)
Summary: Cardiology Nuclear Testing  Nuclear Med Background Indications for Stress Test: Evaluation for Ischemia, Surgical Clearance, Stent Patency  Indications Comments: Pending urological surgery;not scheduled with Dr. Laverle Patter  History: Heart Catheterization, Myocardial Infarction, Myocardial Perfusion Study, Stents  History Comments: '05 MI > CFx stent '09 MPS: (-) ischemia, EF= 52% Hx of WPW  Symptoms: Chest Pain, Chest Pressure, Chest Tightness, Dizziness, Fatigue  Symptoms Comments: Last episode of chest pressure,pain and tightness 3days ago   Nuclear Pre-Procedure Cardiac Risk Factors: Hypertension, Lipids, NIDDM, Smoker Caffeine/Decaff Intake: none NPO After: 10:00 PM Lungs: clear IV 0.9% NS with Angio Cath: 20g     IV Site: R Hand IV Started by: Cathlyn Parsons, RN Chest Size (in) 44     Height (in): 67 Weight (lb): 191 BMI: 30.02 Tech Comments: Atenolol held x 19hrs. BS at 0730 was 131 with no Metformin or insulin.  Nuclear Med Study 1 or 2 day study:  1 day     Stress Test Type:  Lexiscan Reading MD:  Cassell Clement, MD     Referring MD:  Ripley Fraise Resting Radionuclide:  Technetium 60m Tetrofosmin     Resting Radionuclide Dose:  11 mCi  Stress Radionuclide:  Technetium 60m Tetrofosmin     Stress Radionuclide Dose:  33 mCi   Stress Protocol      Max HR:  4 bpm Max Systolic BP: 122 mm HgRate Pressure Product:  488  Lexiscan: 0.4 mg   Stress Test Technologist:  Cathlyn Parsons, RN     Nuclear Technologist:  Doyne Keel, CNMT  Rest Procedure  Myocardial perfusion imaging was performed at rest 45 minutes following the intravenous administration of Technetium 66m Tetrofosmin.  Stress Procedure  The patient received IV Lexiscan 0.4 mg over 15-seconds.  Technetium 78m Tetrofosmin injected at 30-seconds. Patient had some SOB,nausea and fatigue.  Patient developed some upper back pain in recovery and only for . No ectopy noted. There were no  significant changes with infusion.  Quantitative spect images were obtained after a 45 minute delay.  QPS Raw Data Images:  Normal; no motion artifact; normal heart/lung ratio. Stress Images:  Normal homogeneous uptake in all areas of the myocardium. Rest Images:  Normal homogeneous uptake in all areas of the myocardium. Subtraction (SDS):  No evidence of ischemia. Transient Ischemic Dilatation:  1.05  (Normal <1.22)  Lung/Heart Ratio:  .36  (Normal <0.45)  Quantitative Gated Spect Images QGS EDV:  116 ml QGS ESV:  52 ml QGS EF:  55 % QGS cine images:  No wall motion abnormalities.  Findings Normal nuclear study      Overall Impression  Exercise Capacity: Lexiscan with no exercise. BP Response: Normal blood pressure response. Clinical Symptoms: No chest pain ECG Impression: No significant ST segment change suggestive of ischemia. Overall Impression: Normal stress nuclear study.  Appended Document: Cardiology Nuclear Testing ok  Appended Document: Cardiology Nuclear Testing pt aware of results    Appended Document: Cardiology Nuclear Testing pt aware of results

## 2010-09-21 NOTE — Progress Notes (Signed)
Summary: Nuclear Pre-Procedure  Phone Note Outgoing Call Call back at [facility] Phone [phone]   Call placed by: Stanton Kidney, EMT-P,  September 13, 2010 11:16 AM Call placed to: Patient Action Taken: Phone Call Completed Summary of Call: Reviewed information on Myoview Information Sheet (see scanned document for further details).  Spoke with the patient. Stanton Kidney, EMT-P  September 13, 2010 11:16 AM     Nuclear Med Background Indications for Stress Test: Evaluation for Ischemia, Surgical Clearance, Stent Patency  Indications Comments: Pending surgery  History: Heart Catheterization, Myocardial Infarction, Myocardial Perfusion Study, Stents  History Comments: '05 MI > CFx stent '09 MPS: (-) ischemia, EF= 52%     Nuclear Pre-Procedure Cardiac Risk Factors: Hypertension, Lipids, NIDDM, Smoker Height (in): 67

## 2010-09-21 NOTE — Letter (Signed)
Summary: Clearance Letter  [facility], Main Office  [address]   [place], [place] [address]   Phone: [phone]  Fax: [phone]    September 15, 2010  Re:     Mark Blankenship Address:   [address]     [place], [place]  [address] DOB:     1956/01/12 MRN:     [id number]   Dear Dr Laverle Patter,       Mr Maclellan is low risk cardiac wise for surgery. Please contact us with any questions or concerns.    Sincerely,  Deliah Goody, RN/Dr Olga Millers

## 2010-09-21 NOTE — Letter (Signed)
Summary: [facility]   [facility]   Imported By: Roderic Ovens 09/15/2010 12:30:16  _____________________________________________________________________  External Attachment:    Type:   Image     Comment:   External Document

## 2010-09-28 ENCOUNTER — Other Ambulatory Visit: Payer: Self-pay | Admitting: Urology

## 2010-09-28 ENCOUNTER — Encounter (HOSPITAL_COMMUNITY): Payer: Managed Care, Other (non HMO) | Attending: Urology

## 2010-09-28 DIAGNOSIS — Z01812 Encounter for preprocedural laboratory examination: Secondary | ICD-10-CM | POA: Insufficient documentation

## 2010-09-28 LAB — BASIC METABOLIC PANEL
Calcium: 9.9 mg/dL (ref 8.4–10.5)
GFR calc Af Amer: >60 mL/min (ref >60)
GFR calc non Af Amer: >60 mL/min (ref >60)
Glucose, Bld: 148 mg/dL — ABNORMAL HIGH (ref 70–99)
Potassium: 4.3 mEq/L (ref 3.5–5.1)
Sodium: 135 mEq/L (ref 135–145)

## 2010-09-28 LAB — CBC
HCT: 47.8 % (ref 39.0–52.0)
MCHC: 35.1 g/dL (ref 30.0–36.0)
RDW: 12.9 % (ref 11.5–15.5)
WBC: 7.5 10*3/uL (ref 4.0–10.5)

## 2010-09-28 LAB — SURGICAL PCR SCREEN
MRSA, PCR: NEGATIVE
Staphylococcus aureus: POSITIVE — AB

## 2010-10-04 ENCOUNTER — Other Ambulatory Visit: Payer: Self-pay | Admitting: Urology

## 2010-10-04 ENCOUNTER — Ambulatory Visit (HOSPITAL_COMMUNITY)
Admission: RE | Admit: 2010-10-04 | Discharge: 2010-10-04 | Disposition: A | Discharge status: Home / Self Care | Payer: Managed Care, Other (non HMO) | Source: Ambulatory Visit | Attending: Urology | Admitting: Urology

## 2010-10-04 DIAGNOSIS — I10 Essential (primary) hypertension: Secondary | ICD-10-CM | POA: Insufficient documentation

## 2010-10-04 DIAGNOSIS — D090 Carcinoma in situ of bladder: Secondary | ICD-10-CM | POA: Insufficient documentation

## 2010-10-04 DIAGNOSIS — Z87891 Personal history of nicotine dependence: Secondary | ICD-10-CM | POA: Insufficient documentation

## 2010-10-04 DIAGNOSIS — I2510 Atherosclerotic heart disease of native coronary artery without angina pectoris: Secondary | ICD-10-CM | POA: Insufficient documentation

## 2010-10-04 LAB — COMPREHENSIVE METABOLIC PANEL
AST: 27 U/L (ref 0–37)
Albumin: 3.8 g/dL (ref 3.5–5.2)
BUN: 17 mg/dL (ref 6–23)
CO2: 23 mEq/L (ref 19–32)
Calcium: 8.6 mg/dL (ref 8.4–10.5)
Creatinine, Ser: 1.2 mg/dL (ref 0.4–1.5)
GFR calc Af Amer: >60 mL/min (ref >60)
GFR calc non Af Amer: >60 mL/min (ref >60)

## 2010-10-04 LAB — BLOOD GAS, ARTERIAL
Drawn by: 101881
FIO2: 0.5 %
O2 Content: 10 L/min
pCO2 arterial: 47.3 mmHg — ABNORMAL HIGH (ref 35.0–45.0)
pH, Arterial: 7.28 — ABNORMAL LOW (ref 7.350–7.450)

## 2010-10-05 LAB — GLUCOSE, CAPILLARY: Glucose-Capillary: 135 mg/dL — ABNORMAL HIGH (ref 70–99)

## 2010-10-05 NOTE — Letter (Signed)
Summary: [facility] Office Visit Note   [facility] Office Visit Note   Imported By: Roderic Ovens 09/27/2010 15:27:02  _____________________________________________________________________  External Attachment:    Type:   Image     Comment:   External Document

## 2010-10-10 NOTE — Op Note (Signed)
NAME:  Mark Blankenship NO.:  [id number]  MEDICAL RECORD NO.:  [id number]           PATIENT TYPE:  O  LOCATION:  DAYL                         FACILITY:  [facility]  PHYSICIAN:  Heloise Purpura, MD      DATE OF BIRTH:  1955/10/14  DATE OF PROCEDURE: DATE OF DISCHARGE:                              OPERATIVE REPORT   PREOPERATIVE DIAGNOSIS:  Microscopic hematuria and bladder tumor.  POSTOPERATIVE DIAGNOSIS:  Microscopic hematuria and bladder tumor.  PROCEDURE: 1. Cystoscopy. 2. Transurethral resection of large bladder tumor (greater than 5 cm     in total). 3. Pelvic exam under anesthesia.  ANESTHESIA:  General.  COMPLICATIONS:  None.  ESTIMATED BLOOD LOSS:  Minimal.  INDICATIONS:  [name] is a 55 year old gentleman with a history of coronary artery disease, who presented with irritative voiding symptoms and microscopic hematuria.  He underwent further evaluation which revealed findings concerning for a possible bladder tumor versus carcinoma in situ.  He underwent office cystoscopy which also included bladder washing for cytology which was positive.  A CT scan of the abdomen and pelvis also revealed a questionable irregularity of the right femur as well as the eleventh rib on the left side which potentially will require further evaluation.  No evidence of regional lymphadenopathy or metastases or upper tract disease was identified. After discussing these findings with the patient, it was recommended that he proceed with cystoscopy and transurethral resection for further evaluation and diagnosis.  However, he developed chest pain prior to his planned procedure initially and therefore required a cardiac evaluation prior to undergoing further diagnostic evaluation for his urological condition.  After receiving cardiac clearance, he now presents today for further evaluation.  The potential risks, complications, and alternative treatment options associated with  the above procedure were discussed in detail and informed consent was obtained.  DESCRIPTION OF PROCEDURE:  The patient was taken to the operating room and a general anesthetic was administered.  He was given preoperative antibiotics, placed in the dorsal lithotomy position, and prepped and draped in the usual sterile fashion.  Next, a preoperative time-out was performed.  Cystourethroscopy was then performed which revealed a normal anterior and posterior urethra.  The prostatic urethra was noted be slightly friable but otherwise without abnormalities.  A systematic exam was then performed of the bladder with both the 12 and 70 degree lenses. His upper tracts had been previously found to be free of abnormalities on CT imaging.  Inspection of the bladder revealed a slightly raised but somewhat nodular tumor just beyond the right ureteral orifice as well as a flat sessile lesion that was erythematous just beyond the left ureteral orifice.  In addition, there was noted be an erythematous and edematous area all along the posterior bladder tumor.  Together, these areas were representative of approximately 50% of the bladder urothelium and were in excess of 5 cm in total.  The patient was administered a paralytic agent by anesthesia and subsequently underwent transurethral resection after a 28-French resectoscope was placed into the bladder. He did require urethral dilation with Sissy Hoff sounds up to 30-French. Using loop resection, the previously mentioned abnormalities  were then carefully resected.  Although the left and right bladder tumors were close to the ureteral orifice but did not involve the ureteral orifice and he did not require ureteral stent placement.  Once the visible tumor was resected, additional deeper biopsies were taken from the base of the bladder tumors at the right, left, and posterior sites with cold cup biopsy forceps and sent as separate specimens.  Hemostasis was  then achieved with the loop cautery.  The bladder was then emptied and reinspected and hemostasis appeared to be excellent.  All bladder tumor tissue had been removed from the bladder and the initial resections were sent as were labeled as bladder tumor with again the deep biopsies of the base of the tumor sent separately.  Therefore, the patient's bladder was emptied, and a pelvic exam under anesthesia was performed with no 3- dimensional pelvic masses noted.  The patient's prostate was noted to be smooth without nodularity or induration.  An 18-French catheter was left indwelling due to the large amount of urothelium the require resection and B & O suppository was administered.  The patient tolerated the procedure well without complications.  He was able to be awakened and transferred to recovery unit in satisfactory condition.     Heloise Purpura, MD     LB/MEDQ  D:  10/04/2010  T:  10/04/2010  Job:  [id number]  Electronically Signed by Heloise Purpura MD on 10/10/2010 04:41:43 PM

## 2010-10-11 ENCOUNTER — Ambulatory Visit: Payer: Self-pay | Admitting: Cardiology

## 2010-10-12 NOTE — Letter (Signed)
Summary: [facility] Surgical Clearance   [facility] Surgical Clearance   Imported By: Roderic Ovens 10/04/2010 10:29:08  _____________________________________________________________________  External Attachment:    Type:   Image     Comment:   External Document

## 2010-10-20 ENCOUNTER — Other Ambulatory Visit (HOSPITAL_COMMUNITY): Payer: Self-pay | Admitting: Urology

## 2010-10-20 DIAGNOSIS — C679 Malignant neoplasm of bladder, unspecified: Secondary | ICD-10-CM

## 2010-10-22 ENCOUNTER — Encounter (HOSPITAL_COMMUNITY): Payer: Self-pay

## 2010-10-22 ENCOUNTER — Ambulatory Visit (HOSPITAL_COMMUNITY)
Admission: RE | Admit: 2010-10-22 | Discharge: 2010-10-22 | Disposition: A | Discharge status: Home / Self Care | Payer: Managed Care, Other (non HMO) | Source: Ambulatory Visit | Attending: Urology | Admitting: Urology

## 2010-10-22 ENCOUNTER — Telehealth: Payer: Self-pay | Admitting: Cardiology

## 2010-10-22 ENCOUNTER — Encounter (HOSPITAL_COMMUNITY)
Admission: RE | Admit: 2010-10-22 | Discharge: 2010-10-22 | Disposition: A | Discharge status: Home / Self Care | Payer: Managed Care, Other (non HMO) | Source: Ambulatory Visit | Attending: Urology | Admitting: Urology

## 2010-10-22 DIAGNOSIS — C679 Malignant neoplasm of bladder, unspecified: Secondary | ICD-10-CM | POA: Insufficient documentation

## 2010-10-22 DIAGNOSIS — M1710 Unilateral primary osteoarthritis, unspecified knee: Secondary | ICD-10-CM | POA: Insufficient documentation

## 2010-10-22 MED ORDER — TECHNETIUM TC 99M MEDRONATE IV KIT
25.0000 | PACK | Freq: Once | INTRAVENOUS | Status: AC | PRN
  Administered 2010-10-22: 25 via INTRAVENOUS

## 2010-10-22 NOTE — Telephone Encounter (Signed)
Percell Belt faxed to [facility] @ [facility] @[phone] 10/22/10/KM

## 2010-10-26 ENCOUNTER — Other Ambulatory Visit: Payer: Self-pay | Admitting: Urology

## 2010-10-26 ENCOUNTER — Other Ambulatory Visit (HOSPITAL_COMMUNITY): Payer: Managed Care, Other (non HMO)

## 2010-10-26 ENCOUNTER — Encounter (HOSPITAL_COMMUNITY): Payer: Managed Care, Other (non HMO) | Attending: Urology

## 2010-10-26 DIAGNOSIS — I252 Old myocardial infarction: Secondary | ICD-10-CM | POA: Insufficient documentation

## 2010-10-26 DIAGNOSIS — Z79899 Other long term (current) drug therapy: Secondary | ICD-10-CM | POA: Insufficient documentation

## 2010-10-26 DIAGNOSIS — Z01812 Encounter for preprocedural laboratory examination: Secondary | ICD-10-CM | POA: Insufficient documentation

## 2010-10-26 DIAGNOSIS — C679 Malignant neoplasm of bladder, unspecified: Secondary | ICD-10-CM | POA: Insufficient documentation

## 2010-10-26 DIAGNOSIS — E119 Type 2 diabetes mellitus without complications: Secondary | ICD-10-CM | POA: Insufficient documentation

## 2010-10-26 DIAGNOSIS — E7800 Pure hypercholesterolemia, unspecified: Secondary | ICD-10-CM | POA: Insufficient documentation

## 2010-10-26 DIAGNOSIS — I10 Essential (primary) hypertension: Secondary | ICD-10-CM | POA: Insufficient documentation

## 2010-10-26 DIAGNOSIS — Z7982 Long term (current) use of aspirin: Secondary | ICD-10-CM | POA: Insufficient documentation

## 2010-10-26 LAB — CBC
MCV: 89.8 fL (ref 78.0–100.0)
Platelets: 165 10*3/uL (ref 150–400)
RBC: 5.12 MIL/uL (ref 4.22–5.81)
WBC: 8.2 10*3/uL (ref 4.0–10.5)

## 2010-10-26 LAB — BASIC METABOLIC PANEL
BUN: 15 mg/dL (ref 6–23)
Chloride: 106 mEq/L (ref 96–112)
Potassium: 4.6 mEq/L (ref 3.5–5.1)
Sodium: 139 mEq/L (ref 135–145)

## 2010-10-26 LAB — SURGICAL PCR SCREEN
MRSA, PCR: NEGATIVE
Staphylococcus aureus: NEGATIVE

## 2010-11-08 ENCOUNTER — Other Ambulatory Visit: Payer: Self-pay | Admitting: Urology

## 2010-11-08 ENCOUNTER — Ambulatory Visit (HOSPITAL_COMMUNITY)
Admission: RE | Admit: 2010-11-08 | Discharge: 2010-11-08 | Disposition: A | Discharge status: Home / Self Care | Payer: Managed Care, Other (non HMO) | Source: Ambulatory Visit | Attending: Urology | Admitting: Urology

## 2010-11-08 DIAGNOSIS — Z7982 Long term (current) use of aspirin: Secondary | ICD-10-CM | POA: Insufficient documentation

## 2010-11-08 DIAGNOSIS — I252 Old myocardial infarction: Secondary | ICD-10-CM | POA: Insufficient documentation

## 2010-11-08 DIAGNOSIS — E7800 Pure hypercholesterolemia, unspecified: Secondary | ICD-10-CM | POA: Insufficient documentation

## 2010-11-08 DIAGNOSIS — C679 Malignant neoplasm of bladder, unspecified: Secondary | ICD-10-CM | POA: Insufficient documentation

## 2010-11-08 DIAGNOSIS — I10 Essential (primary) hypertension: Secondary | ICD-10-CM | POA: Insufficient documentation

## 2010-11-08 DIAGNOSIS — Z79899 Other long term (current) drug therapy: Secondary | ICD-10-CM | POA: Insufficient documentation

## 2010-11-08 DIAGNOSIS — E119 Type 2 diabetes mellitus without complications: Secondary | ICD-10-CM | POA: Insufficient documentation

## 2010-11-08 LAB — GLUCOSE, CAPILLARY: Glucose-Capillary: 140 mg/dL — ABNORMAL HIGH (ref 70–99)

## 2010-11-09 LAB — GLUCOSE, CAPILLARY: Glucose-Capillary: 160 mg/dL — ABNORMAL HIGH (ref 70–99)

## 2010-11-11 NOTE — Op Note (Signed)
  NAME:  Mark Blankenship, Mark Blankenship NO.:  [id number]  MEDICAL RECORD NO.:  [id number]           PATIENT TYPE:  O  LOCATION:  DAYL                         FACILITY:  [facility]  PHYSICIAN:  Heloise Purpura, MD      DATE OF BIRTH:  [birth year]  DATE OF PROCEDURE:  11/08/2010 DATE OF DISCHARGE:                              OPERATIVE REPORT   PREOPERATIVE DIAGNOSIS:  Urothelial carcinoma of the bladder.  POSTOPERATIVE DIAGNOSIS:  Urothelial carcinoma of the bladder.  PROCEDURES: 1. Cystoscopy. 2. Transurethral resection of bladder tumor.  SURGEON:  Heloise Purpura, MD  ANESTHESIA:  General.  COMPLICATIONS:  None.  ESTIMATED BLOOD LOSS:  Minimal.  SPECIMEN:  Bladder tumor.  DISPOSITION OF SPECIMEN:  To pathology.  INDICATIONS:  [name] is a 55 year old gentleman who recently underwent transurethral resection of bladder tumor which revealed a high- grade T1 urothelial carcinoma of the bladder.  He returns today for a restaging transurethral resection.  The potential risks, complications, and alternative treatment options have been discussed in detail and informed consent obtained.  DESCRIPTION OF PROCEDURE:  The patient was taken to the operating room and a general anesthetic was administered.  He was given preoperative antibiotics, placed in the dorsal lithotomy position, and prepped and draped in the usual sterile fashion.  Next, preoperative time-out was performed.  Cystourethroscopy was then performed, which revealed a normal anterior and posterior urethra.  Inspection of the bladder revealed evidence of necrotic healing bladder mucosa consistent with his prior resection.  No definite tumor recurrence was noted.  Surrounding mucosal edema was identified.  The ureteral orifices were identified in their normal anatomic position and appeared to be effluxing clear urine. The cystoscope was then removed after a full examination with both 12- and 7-degree lenses.  The  28-French resectoscope sheath was unable to be passed into the distal urethra and therefore [employer] sounds were used to serially dilate the urethra from 20-French to 30-French.  A 28-French resectoscope sheath was then placed without difficulty.  The prior resection sites were then examined and representative cold cup biopsies were taken of the deep tissue including muscularis propria.  The previous resection sites were then resected using loupe cutting resection and these areas were then fulgurated to achieve hemostasis. The bladder was emptied and then reinspected and again hemostasis was ensured.  It was not felt that a Foley catheter was necessary at the end of the procedure.  The patient tolerated the procedure well without complications.  He was able to be awakened and transferred to the recovery unit in satisfactory condition.     Heloise Purpura, MD     LB/MEDQ  D:  11/08/2010  T:  11/08/2010  Job:  [id number]  Electronically Signed by Heloise Purpura MD on 11/11/2010 06:05:39 AM

## 2010-12-07 NOTE — Assessment & Plan Note (Signed)
[facility]                            CARDIOLOGY OFFICE NOTE   ELIH, MOONEY                   MRN:          [id number]  DATE:09/08/2008                            DOB:          1955/11/04    [name] is a pleasant gentleman who has a history of coronary  disease status post drug-eluting stent to the circumflex in August 2005.  His last Myoview was performed on September 12, 2007.  The patient's  ejection fraction was 52%.  There is mild inferior thinning, but no sign  of scar or ischemia.  Since that time, he denies any increased dyspnea,  chest pain, palpitations, or syncope.  There is no pedal edema.  He has  discontinued his tobacco use for approximately 3 months.   MEDICATIONS:  1. Plavix 75 mg p.o. daily.  2. Aspirin 81 mg p.o. daily.  3. Lisinopril 20 mg p.o. daily.  4. Vytorin 10/40 nightly.  5. Metformin 500 mg p.o. b.i.d.  6. Atenolol 50 mg p.o. daily.  7. Augmentin.  8. Niaspan 750 mg p.o. daily.   PHYSICAL EXAMINATION:  VITAL SIGNS:  Blood pressure of 120/80 and his  pulse is 72.  He weighs 201 pounds.  HEENT:  Normal.  NECK:  Supple with no bruits.  CHEST:  Clear.  CARDIOVASCULAR:  Regular rate and rhythm.  ABDOMINAL:  No tenderness.  EXTREMITIES:  No edema.   His electrocardiogram shows a sinus rhythm at a rate of 72.  There are  no ST changes noted.   DIAGNOSES:  1. Coronary artery disease - he will continue with his aspirin,      Plavix, statin, ACE inhibitor, and beta-blocker.  His most recent      Myoview was normal.  2. Hypertension - his blood pressure is adequately controlled on his      present medications.  We will check with BMET and follow potassium      and renal function.  3. Diabetes mellitus.  4. Tobacco abuse - he has discontinued this for the past 3 months.  5. Hyperlipidemia - he will continue on his Vytorin.  We will check      lipids and liver and adjust as indicated.  6. History of  arthritis.  7. History of intolerance to Lipitor.   I will see him back in 12 months.     [name] [name], MD, [facility]  Electronically Signed    BSC/MedQ  DD: 09/08/2008  DT: 09/09/2008  Job #: [id number]

## 2010-12-07 NOTE — Assessment & Plan Note (Signed)
[facility]                            CARDIOLOGY OFFICE NOTE   ZERICK, PREVETTE                   MRN:          [id number]  DATE:09/04/2007                            DOB:          1956-03-02    HISTORY OF PRESENT ILLNESS:  Mr. Bougie is a pleasant gentleman who  has a history of coronary disease status post drug-eluting stent to the  circumflex in August 2005.  Since I last saw him, he is doing reasonable  well.  He was having dyspnea on exertion, but he discontinued his  tobacco use 6 weeks ago and states this is much improved.  There is no  orthopnea, PND, pedal edema, palpitations or syncope.  He does  occasionally have pain in his chest.  However, this typically occurs at  rest and lasts for seconds.  He also occasionally has a pain in his back  which he wonders whether it may be similar to the pain he had with his  infarct.  He does not have exertional chest pain.  There is no  associated nausea, vomiting, shortness of breath or diaphoresis.  Note,  he is not exercising routinely, nor does he appear to be following a  strict diet.  He discontinued his tobacco use 6 weeks ago.   MEDICATIONS:  1. Plavix 75 mg daily.  2. Aspirin 81 mg daily.  3. Niaspan 750 mg daily.  4. Lisinopril 20 mg daily.  5. Toprol 50 mg daily.  6. Vytorin 10/40 day.  7. Metformin 500 mg p.o. b.i.d.   PHYSICAL EXAMINATION:  VITAL SIGNS:  Blood pressure 128/91.  His pulse  is 66.  HEENT:  Normal.  NECK:  Supple with no bruits.  CHEST:  Clear.  CARDIOVASCULAR:  Regular rate.  ABDOMEN:  Shows no tenderness.  There is no pulsatile masses.  No  bruits.  EXTREMITIES:  Show no edema.   STUDIES:  Electrocardiogram shows sinus rhythm at a rate of 66.  There  are no ST changes noted.   DIAGNOSES:  1. Chest pain - his symptoms are somewhat atypical.  However, it has      been almost two years since his previous Myoview.  We will risk      stratify with stress  Myoview.  If it shows no ischemia, then we      will continue with his medical therapy.  2. Coronary disease - we will continue his aspirin, Plavix, statin,      Niaspan, ACE inhibitor and beta blocker.  3. Hypertension - his blood pressure is mildly elevated today.      However, he states it runs in the 120/80 range at home.  He will      continue with his medications, and I will check a B-met to follow      his potassium and renal function.  I will also change his Toprol to      atenolol if there is a problem at the plant in getting the Toprol.  4. Diabetes mellitus - per his primary care physician.  5. Tobacco abuse - I encouraged him to stay off  of cigarettes.  6. History of intolerance to Lipitor.  7. History of arthritis.  8. Hyperlipidemia - check lipids and liver and adjust with goal LDL      less than 70.   We discussed importance of diet, exercise.  I will see him back in 12  months.     [name] [name], MD, [facility]  Electronically Signed    BSC/MedQ  DD: 09/04/2007  DT: 09/06/2007  Job #: [id number]

## 2010-12-10 NOTE — Cardiovascular Report (Signed)
NAME:  Mark Blankenship, Mark Blankenship                      ACCOUNT NO.:  [id number]   MEDICAL RECORD NO.:  [id number]                   PATIENT TYPE:  INP   LOCATION:  2023                                 FACILITY:  [facility]   PHYSICIAN:  Charlies Constable, M.D. [facility]              DATE OF BIRTH:  July 26, 1955   DATE OF PROCEDURE:  02/28/2004  DATE OF DISCHARGE:                              CARDIAC CATHETERIZATION   CLINICAL HISTORY:  Mr. Tomas is 55 years old and has documented  nonobstructive coronary disease at cath in 2001.  He also has diabetes,  hypertension, hyperlipidemia.  He developed chest pain last night, came to  the emergency room today with continued chest pain, but normal ECG.  His CK-  MBs were positive and he had persistent pain.  He was seen by Dr. [name]  and because of consistent pain arrangements were made for him to be taken to  the cath lab urgently.   PROCEDURE:  The procedure was performed via the right femoral artery using  arterial sheath and 6 French preformed coronary catheters. A front wall  arterial puncture was performed and Omnipaque contrast was used.  After  completion of diagnostic study we made a decision to proceed with  intervention on the totally occluded circumflex artery.   The patient was given additional heparin to perform an ACT of greater than  200 seconds and was on an integrilin infusion on arrival to the lab.  We  used a CLS-4 6 [place] guiding catheter with side holes and a soft Asahi  wire.  We crossed the totally obstructed occlusion of the mid circumflex  with the wire without too much difficulty.  We first treated the lesion with  the export catheter performing two aspirations.  This established TIMI-3  flow.  We then dilated into two distal branches with a 2.0 x 20-mm Maverick  performing one inflation of 8 atmospheres for 30 seconds in each branch.  We  then elected to stent the circumflex before the bifurcation into the two  posterior lateral  branches.  We used a 2.5 x 18-mm Cypher stent and deployed  this with one inflation of 12 atmospheres for 30 seconds.  The proximal  portion of the stent was undersized for the proximal reference vessel so we  went back in with a 3.25 x 15-mm Quantum Maverick performing one inflation  up to 16 atmospheres for 30 seconds.  Repeat diagnostic study was then  performed through the guiding catheter.  The patient tolerated the procedure  well and left the laboratory in satisfactory condition.   RESULTS:  The aortic pressure was 101/71 with a mean of 86 and left  ventricular pressure was 101/20.   Left main coronary artery:  The left main coronary was free of significant  disease.   Left anterior descending artery:  The left anterior descending artery gave  rise to a large diagonal branch, two septal perforators and a  second  diagonal branch.  The LAD was diffusely irregular and there was 30 and 50%  narrowing in the mid portion.   Circumflex artery:  The circumflex artery gave rise to a very small ramus  branch, atrial branch and marginal branch and then was completely occluded  at its mid portion.  There were no collaterals seen distally.   Right coronary artery:  The right coronary artery was a moderately large  vessel that gave rise to a conus branch, two right ventricular branches ,  posterior descending branch, and a posterior lateral branch.  There were  irregularities throughout the right coronary artery and there was 70%  narrowing in the distal right coronary artery after the posterior descending  branch.   LEFT VENTRICULOGRAM:  The left ventriculogram performed in the RAO  projection showed good wall motion with no areas of hypokinesis.  The  estimated ejection fraction was 60%.   The left ventriculogram performed in the LAO projection showed good wall  motion with no areas of hypokinesis.   Following stenting of the lesion in the mid circumflex artery, the stenosis   improved from 100% to 0% and the flow improved from TIMI-0 to TIMI-3 flow.   CONCLUSIONS:  1. Acute non-ST-elevation myocardial infarction with persistent pain with     total occlusion of the mid circumflex artery, 50% narrowing in the mid     left anterior descending, 70% narrowing in the distal right coronary     artery and normal left ventricular function.  2. Successful stenting of the lesion in the mid circumflex artery with a     drug-eluting stent with improvement in stent renarrowing from 100% to 0%     and improvement in flow from TIMI-0 to TIMI-3 flow.   DISPOSITION:  The patient was returned to the postangioplasty unit for  further observation.                                               Charlies Constable, M.D. [facility]    BB/MEDQ  D:  02/28/2004  T:  02/29/2004  Job:  [id number]   cc:   Teena Irani. Arlyce Dice, M.D.  [address]  [place]  [place] [address]  Fax: [phone]

## 2010-12-10 NOTE — Discharge Summary (Signed)
NAME:  Mark Blankenship, Mark Blankenship                      ACCOUNT NO.:  [id number]   MEDICAL RECORD NO.:  [id number]                   PATIENT TYPE:  INP   LOCATION:  2023                                 FACILITY:  [facility]   PHYSICIAN:  Olga Millers, M.D. [facility]            DATE OF BIRTH:  1955-11-11   DATE OF ADMISSION:  02/28/2004  DATE OF DISCHARGE:  03/01/2004                                 DISCHARGE SUMMARY   BRIEF HISTORY:  This is a 55 year old male with a history of diabetes  mellitus, hypertension and hyperlipidemia, who presented to [facility] on February 28, 2004 for evaluation of chest pain.  The patient had a  previous cardiac catheterization in 2001 which showed noncritical coronary  disease.  The patient was admitted for further evaluation.   PAST MEDICAL HISTORY:  As noted, the patient has a history of:  1. Diabetes mellitus, diet-controlled.  2. Hypertension.  3. Hyperlipidemia.  4. Previous back surgery.  5. He had a catheterization in 2001 that showed noncritical coronary artery     disease.   ALLERGIES:  No known drug allergies.   MEDICATIONS PRIOR TO ADMISSION:  Medications prior to admission included  verapamil, hydrochlorothiazide, Zestril, Mobic and Zetia.   HOSPITAL COURSE:  As noted, this patient was admitted to [facility]  for further evaluation of chest pain.  He was taken to the cath lab on the  day of admission by Dr. Charlies Constable.  He was found to have a 30% proximal  LAD, a 50% mid LAD, totally occluded mid-circumflex.  The RCA had a 70%  distal lesion.  The left ventricle was normal.  The circumflex lesion was  stented from 100% occlusion to 0%.  TIMI flow went from 0 to 3.  The left  ventricle was normal with an ejection fraction of 60%.   The patient ruled in for a non-ST-elevation MI.  He was monitored in the  hospital for several days, his medications were adjusted and arrangements  were made to discharge the patient in stable and  improved condition on  March 01, 2004.   LABORATORY DATA:  On the day prior to discharge, hemoglobin was 14.8,  hematocrit 41.9, WBC was 8.700, platelets 147,000.  A chemistry profile on  the 7th revealed BUN of 14, creatinine 1.2, potassium 3.8, glucose 89.  His  enzymes peaked on the 7th with a CK of 1110, MB 183; troponin was 32.8.  A  cholesterol level was performed on February 29, 2004; it was 121; a complete  panel was not performed.  TSH was 0.515.  A BNP was 32.   A chest x-ray shows low lung volumes but no active disease.   DISCHARGE MEDICATIONS:  1. Plavix 75 mg daily.  2. Coated aspirin 81 mg daily.  3. Toprol-XL 25 mg daily.  4. Zocor 40 mg daily.  5. Nitroglycerin p.r.n. for chest pain.  6. He was to take  Tylenol as needed for pain.   The patient had been intolerant to Lipitor in the past.  He was told not to  take his previous home medications but to bring all of his medications to  his next office visit.   ACTIVITY:  The patient was told to avoid any strenuous activity or driving  for at least 1 week.   DIET:  He was to be on a low-salt, low-fat diet with no sweets.   SPECIAL DISCHARGE INSTRUCTIONS:  He was told to call the office if he had  any increased pain, swelling or bleeding from his groin.   FOLLOWUP:  He was to follow up with Dr. Lewayne Bunting physician assistant  on March 15, 2004 at 3 p.m.  He was to follow up with [facility]; he was told to call for an appointment.  The patient should  probably have a lipid profile and liver function tests performed in  approximately 6-8 weeks.  As noted, he had previously been on Zetia; he had  been intolerant to Lipitor in the past.   PROBLEM LIST AT TIME OF DISCHARGE:  1. Myocardial infarction, non-ST-elevation.  2. Cardiac catheterization, February 28, 2004, with stenting of the circumflex.  3. Normal ejection fraction at time of catheterization.  4. History of diabetes, diet-controlled.  5.  History of hypertension.  6. History of hyperlipidemia, intolerant to Lipitor previously, treated with     Zetia, switched to Zocor, this admission.  7. History of back surgery.  8. Strong family history of coronary artery disease.  9. Remote tobacco.      Delton See, P.A. [facility]                  Olga Millers, M.D. [facility]    DR/MEDQ  D:  03/01/2004  T:  03/01/2004  Job:  [id number]   cc:   [facility]

## 2010-12-10 NOTE — Discharge Summary (Signed)
[place]. [facility]  Patient:    Mark Blankenship                    MRN: [id number] Adm. Date:  [id number] Disc. Date: 08/10/99 Attending:  Learta Codding Dictator:   Leonides Cave, P.A. CC:         Dr. Arlyce Dice, family physician, at [facility]             Lewayne Bunting, M.D. [facility], [facility] Cardiology office in [facility]                  Referring Physician Discharge [place]  DATE OF BIRTH:  Apr 26, 1956  DISCHARGE DIAGNOSES: 1. Noncritical coronary artery disease. 2. Hypercholesterolemia. 3. Diabetes. 4. Ongoing tobacco abuse. 5. Hypertension. 6. Positive family history for early coronary artery disease.  HISTORY:  A 55 year old white male with no prior cardiac disease was seen in Dr. Nita Sells office for left arm pain and chest pain, and general malaise. Patient was referred to the cardiologist for admission and possible catheterization due to cardiac risk factors and symptoms.  Patient had a brief episode of left arm pain three days prior to admission.  On the day of admission, had left arm pain and chest pressure and left lower extremity pain, as well.  Patient did have associated shortness of breath but denies nausea, vomiting, and diaphoresis.  Patient was admitted to [facility] for cardiac catheterization.  HOSPITAL COURSE:  Patient was taken to the catheterization laboratory on August 10, 1999 by Dr. Charlton Haws.  Please see catheterization report for full details. In Dr. Ricki Miller progress notes, patient had three-vessel mild coronary artery disease.  Patient did not have any lesions of greater than 50%.  Those 50% lesions were in the patients first obtuse marginal branch.  The patient had a normal ejection fraction of 60%.  Patient was discharged home on the same evening as his catheterization, August 10, 1999.  MEDICATIONS:  Patient will continue the same medications he came in on: 1. Glucotrol XL mg q.d. 2. Mavik  q.d. 3. He was given a prescription for Baycol 0.4 mg q.h.s. 4. He was instructed to take a coated aspirin q.d.  ACTIVITY:  He was instructed to undergo no heavy lifting, driving, or sexual activity for two days.  DIET:  He was told to adhere to a strict diabetic diet.  SITE CARE:  Patient was told that if bleeding or swelling occurred at the catheterization site, he should call the [facility] Cardiology office immediately.  SPECIAL INSTRUCTIONS:  He was strongly urged to discontinue smoking.  FOLLOW-UP:  He can follow up with his primary care physician, Dr. Arlyce Dice, in two to three weeks, and he will need to call for that appointment.  He can follow up with Dr. Andee Lineman in the [facility] Cardiology team on a p.r.n. basis. DD:  08/10/99 TD:  08/10/99 Job: [id number]

## 2010-12-10 NOTE — Cardiovascular Report (Signed)
[place]. [facility]  Patient:    Mark Blankenship                    MRN: [id number] Proc. Date: 08/10/99 Adm. Date:  [id number] Attending:  Learta Codding CC:         Teena Irani. Arlyce Dice, M.D.             EP Lab                        Cardiac Catheterization  PROCEDURE:  Coronary arteriography.  INDICATIONS:  Multiple coronary risk factors with chest pain.  FINDINGS: 1. Left Main Coronary Artery:  Had a 20% discrete stenosis. 2. Left Anterior Descending Artery:  A medium-sized vessel. The proximal portion    had 20% multiple, discrete lesions.  The mid portion had multiple 20%    discrete lesions.  The distal vessel was normal sized, disease.  The first    diagonal branch had 20% multiple, discrete lesions. 3. Circumflex Coronary Artery:  Also a medium-sized vessel. There was a large    intermediate branch with 20% multiple discrete lesions.  The proximal circumflex    had 30% multiple, discrete lesions.   The first obtuse marginal branch was    small, with a 50% tubular and a 50% mid vessel lesion.  The distal circumflex    had 30% multiple, discrete lesions. 4. Right Coronary Artery:  Dominant.  The distal right coronary artery had 20%    multiple, discrete lesions.  The posterolateral branch had a 40% discrete    lesion.  VENTRICULOGRAPHY:  RAO ventriculography was normal.  Ejection fraction was in the 60% range.  There was no gradient across the aortic valve and no mitral regurgitation.  HEMODYNAMIC DATA: 1. Aortic pressure:  125/68. 2. Left ventricular pressure:  125/5.  IMPRESSION:  Mr. Brining does not have any critical disease.  He does need continued risk factor modification.  The obtuse marginal branch is small and only has 50% lesions.  Apparently he is allergic to a cholesterol medication, which I believe to be Lipitor.  He will be discharged on Pravachol 20 mg a day, as well as diabetes medication.   He will follow up with Dr.  Arlyce Dice (who I spoke with) in two weeks.  The patient would benefit from yearly Cardiolite study to monitor progression of his disease as well as hemoglobin A1C monitoring.  The right femoral artery was closed using the Perclose device at the end of the  case, with excellent hemostasis.  Because the patient is a diabetic, he was given a gram of Ancef.DD:  08/10/99 TD:  08/10/99 Job: [id number]

## 2010-12-10 NOTE — H&P (Signed)
NAME:  Mark Blankenship, Mark Blankenship NO.:  [id number]   MEDICAL RECORD NO.:  [id number]                   PATIENT TYPE:  EMS   LOCATION:  [facility]                                 FACILITY:  [facility]   PHYSICIAN:  Olga Millers, M.D. [facility]            DATE OF BIRTH:  1956-01-03   DATE OF ADMISSION:  02/28/2004  DATE OF DISCHARGE:                                HISTORY & PHYSICAL   The patient is a 55 year old male with a past medical history of diabetes  mellitus, hypertension, and hyperlipidemia who presents with chest pain.  The patient has had a previous cardiac catheterization in 2001.  At that  time, he was found to have noncritical coronary disease.  The patient  developed subscapular back pain at approximately 3 p.m. yesterday.  It  radiated to the chest and left upper extremity.  It was described as a  pressure.  There was associated nausea but there was no shortness of  breath or diaphoresis.  The pain has been persistent since that time.  He  was seen at the [facility] today and sent to the emergency  room.  His cardiac markers were found to be elevated and cardiology was  asked to evaluate.  Of note, the patient still complains of pain at the time  of this evaluation but it is improving.   PAST MEDICAL HISTORY:  1. Diabetes mellitus, diet-controlled.  2. Hypertension.  3. Hyperlipidemia.  4. He has had previous back surgery.   FAMILY HISTORY:  Strongly positive for coronary artery disease and he had a  brother who died of a myocardial infarction at age 12.   SOCIAL HISTORY:  He does have a history of tobacco use but denies any  ongoing tobacco use.  He does not consume alcohol.   MEDICATIONS AT HOME:  Verapamil, HCTZ, Zestril, Mobic, and Zetia.   ALLERGIES:  He has no known drug allergies.   REVIEW OF SYSTEMS:  He denies any headaches, fevers or chills.  There is no  productive cough or hemoptysis.  There is no dysphagia or odynophagia,  melena, hematochezia.  There is no dysuria, hematuria.  There is no rash or  seizure activity.  There is no orthopnea, PND, or pedal edema.  There is no  claudication.  He does have some pain in his knees when he ambulates.  The  remaining systems are negative.   PHYSICAL EXAMINATION:  VITAL SIGNS:  Show a blood pressure of 95/60 and his  pulse was 52.  GENERAL:  He is well developed and well nourished, no acute distress.  He  does not appear to be depressed.  SKIN:  Warm and dry.  There is no peripheral clubbing.  HEENT:  Unremarkable with no mileage.  NECK:  Supple with a normal motion bilaterally and there are no bruits  noted.  There is no jugular venous distention and no thyromegaly noted.  CHEST:  Clear to auscultation.  __________  .  CARDIOVASCULAR:  Bradycardic rate but a regular rhythm.  There are no  murmurs, rubs or gallops noted.  ABDOMEN:  No tenderness.  Positive bowel sounds.  No hepatosplenomegaly.  No  masses appreciated.  There is no abdominal bruit.  He has 2+ femoral pulses  bilaterally.  No bruits.  EXTREMITIES:  Show no edema that I can palpate.  No cords.  He has 2+  dorsalis pedis pulses bilaterally.  NEUROLOGIC:  Grossly intact.   His electrocardiogram shows a normal sinus rhythm with no acute ST changes.   His initial enzymes show a CK-MB of 31.7 and a troponin I was 1.09.  His  initial BUN and creatinine are 21 and 1.4 with a hemoglobin of 16.3 and a  hematocrit of 48.  The remaining laboratories and chest x-ray are pending at  the time of this dictation.   DIAGNOSES:  1. Subendocardial myocardial infarction.  2. Diet-controlled diabetes mellitus.  3. Hypertension.  4. Hyperlipidemia.  5. Positive family history.   PLAN:  Mark Blankenship presents with chest pain and positive cardiac markers.  We will treat him with aspirin, heparin, Integrilin, and Lipitor.  I will  also add low-dose beta-blockade as tolerated by blood pressure and pulse.  The patient  does need a cardiac catheterization on Monday or sooner if we  are unable to make him pain free.  The risks and benefits have been  discussed with the patient, and he agrees to proceed.  We will admit him to  the CCU for monitoring.  He will also need risk factor modification.  We  will check lipids tomorrow morning.                                                Olga Millers, M.D. [facility]    BC/MEDQ  D:  02/28/2004  T:  02/28/2004  Job:  [id number]

## 2010-12-10 NOTE — Assessment & Plan Note (Signed)
[facility]                            CARDIOLOGY OFFICE NOTE   Mark Blankenship, Mark Blankenship                   MRN:          [id number]  DATE:08/21/2006                            DOB:          28-May-1956    Mark Blankenship is a pleasant gentleman who has a history of coronary  disease and drug-eluting stents of the circumflex.  His most recent  nuclear study was performed in April 2007.  His ejection fraction at  that time was 53%.  There was inferior thinning consistent with possible  scar and soft tissue attenuation but no significant ischemia.  Since I  last saw him, he denies any dyspnea, pedal edema, or syncope.  He has  not had any exertional chest pain.  He did state about a week ago he had  some pain in his neck and a brief, sharp pain in his chest that did not  sound cardiac.  His medications include:  1. Plavix 75 mg p.o. daily.  2. Aspirin 81 mg p.o. daily.  3. Niaspan 750 mg p.o. nightly.  4. Lisinopril 20 mg p.o. daily.  5. Toprol 50 mg p.o. daily.  6. Vytorin 10/40 nightly.  7. Metformin.   PHYSICAL EXAM:  Blood pressure 122/82 and his pulse was 64.  He weighs  205 pounds.  His neck is supple and there are no bruits.  CHEST:  Clear.  CARDIOVASCULAR:  Regular rate and rhythm.  EXTREMITIES:  No edema.  ABDOMEN:  No pulsatile masses and no bruits.   DIAGNOSES:  1. Coronary artery disease.  2. History of Wolfe-Parkinson-White.  3. Diabetes mellitus.  4. Hypertension.  5. Tobacco abuse.  6. History of intolerance to Lipitor.  7. History of arthritis.   PLAN:  Mark Blankenship is doing well from a symptomatic standpoint.  We  will continue with his present medications.  We have discussed risk  factor modification and I, again, urged him to discontinue his tobacco  use.  We also discussed diet and exercise.  I will check a CMET and  lipids today.  We will see him back in 1 year.     [name] [name], MD, [facility]  Electronically Signed   BSC/MedQ  DD: 08/21/2006  DT: 08/21/2006  Job #: [id number]

## 2011-08-14 ENCOUNTER — Encounter: Payer: Self-pay | Admitting: Family Medicine

## 2011-08-15 ENCOUNTER — Encounter: Payer: Self-pay | Admitting: Family Medicine

## 2011-08-15 ENCOUNTER — Telehealth: Payer: Self-pay | Admitting: Family Medicine

## 2011-08-15 ENCOUNTER — Ambulatory Visit (INDEPENDENT_AMBULATORY_CARE_PROVIDER_SITE_OTHER): Payer: Managed Care, Other (non HMO) | Admitting: Family Medicine

## 2011-08-15 DIAGNOSIS — I10 Essential (primary) hypertension: Secondary | ICD-10-CM

## 2011-08-15 DIAGNOSIS — I2510 Atherosclerotic heart disease of native coronary artery without angina pectoris: Secondary | ICD-10-CM

## 2011-08-15 DIAGNOSIS — E119 Type 2 diabetes mellitus without complications: Secondary | ICD-10-CM

## 2011-08-15 DIAGNOSIS — Z8551 Personal history of malignant neoplasm of bladder: Secondary | ICD-10-CM

## 2011-08-15 DIAGNOSIS — F17200 Nicotine dependence, unspecified, uncomplicated: Secondary | ICD-10-CM

## 2011-08-15 NOTE — Assessment & Plan Note (Signed)
Problem stable.  Continue current medications and diet appropriate for this condition.  We have reviewed our general long term plan for this problem and also reviewed symptoms and signs that should prompt the patient to call or return to the office. Keep f/u with Dr. [name].  Pt says Dr. [name] is monitoring/managing his hyperlipidemia.

## 2011-08-15 NOTE — Assessment & Plan Note (Signed)
Ongoing routine f/u with Dr. [name].  Continue current meds. Stress testing normal 2012 prior to urologic surgery.

## 2011-08-15 NOTE — Assessment & Plan Note (Addendum)
Pt recalls last HbA1c "about 7", and says labs done by Mark Blankenship about 04/2011. Will continue current meds and wait on any further labs/HbA1c until next f/u in 22mo for CPE. He admits his winter diet is not very good, also admits to poor glucose monitoring at home.  Encouraged him to improve in both areas. He'll try to get diab rtpthy screen ASAP but money is the issue here, so we'll see. Obtain old primary care records.

## 2011-08-15 NOTE — Progress Notes (Signed)
Office Note 08/15/2011  CC:  Chief Complaint  Patient presents with  . Establish Care    new patient    HPI:  Mark Blankenship is a 56 y.o. White male who is here to establish care. Patient's most recent primary MD: Silas Sacramento, FNP. Old records in [facility] were reviewed prior to or during today's visit.  Here to establish b/c prior [facility] practitioner was within a different health system than his specialists (heart and urology).  Denies acute complaint. Long time smoker, cutting back and is down to 2-3 cigs/day, says he has no chronic lung issues. No cough.   Routine cardiology f/u with Dr. [name] coming up in a month or two.  Pt stopped his niaspan b/c of financial issues, says "something had to give".  Has spent a lot of time and money in the last year or so on treatment for bladder cancer, so he has fallen behind on some things like diabetic retinopathy screening exams.   Says he rarely checks his glucose and has no glucose numbers to offer me today.  Says he monitors bp at home and finds it usually below 140/90.  Past Medical History  Diagnosis Date  . History of bladder cancer 2012    Mass removed + gets ongoing BCG bladder infusion treatments with urologist Laverle Patter).  Marland Kitchen CAD (coronary artery disease)      '05 MI > DES to circumflex  . HTN (hypertension)   . Hyperlipidemia   . Diabetes mellitus type 2, controlled   . Wolf-Parkinson-White syndrome   . Tobacco abuse     Past Surgical History  Procedure Date  . Bladder tumor excision 11/08/10    uroepithelial carcinoma (Dr. Laverle Patter).  . Myocardial perfusion study 2009    Neg ischemia, EF 52%  . Lexiscan 2012    Normal/negative  . Lumbar spine surgery 1989 & 1991    Neurosurgeon in [place] ([facility])-no trouble since last surgery  . Colonoscopy w/ polypectomy 2008    ?hyperplastic per pt report repeat 10 yrs (need old records)    Family History  Problem Relation Age of Onset  . Hypertension Mother   . Cancer Father     colon and prostate, mets to lungs.  . Hypertension Father   . Heart disease Brother     d. 42 MI  . Multiple sclerosis Brother   . Diabetes Brother   . Heart disease Paternal Uncle     History   Social History  . Marital Status: Married    Spouse Name: N/A    Number of Children: N/A  . Years of Education: N/A   Occupational History  . Not on file.   Social History Main Topics  . Smoking status: Current Everyday Smoker -- 30 years    Types: Cigarettes  . Smokeless tobacco: Never Used   Comment: 2-3 cigs a day  . Alcohol Use: No  . Drug Use: No  . Sexually Active: Not on file   Other Topics Concern  . 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    Outpatient Encounter Prescriptions as of 08/15/2011  Medication Sig Dispense Refill  . acetaminophen (TYLENOL) 500 MG tablet Take 1,000 mg by  mouth as needed.      Marland Kitchen aspirin 81 MG tablet Take 160 mg by mouth daily.      Marland Kitchen atenolol (TENORMIN) 50 MG tablet Take 50 mg by mouth daily.      . fluticasone (FLONASE) 50 MCG/ACT nasal spray Place 2 sprays into the nose daily.      Marland Kitchen glipiZIDE (GLUCOTROL XL) 2.5 MG 24 hr tablet Take 2.5 mg by mouth daily.      Marland Kitchen lisinopril (PRINIVIL,ZESTRIL) 20 MG tablet Take 20 mg by mouth daily.      . metFORMIN (GLUCOPHAGE) 500 MG tablet Take 500 mg by mouth 2 (two) times daily with a meal.      . phenazopyridine (PYRIDIUM) 200 MG tablet Take 200 mg by mouth every 8 (eight) hours as needed.      . simvastatin (ZOCOR) 40 MG tablet Take 40 mg by mouth every evening.      . Tamsulosin HCl (FLOMAX) 0.4 MG CAPS Take 0.4 mg by mouth daily.      . traMADol (ULTRAM) 50 MG tablet Take 50 mg  by mouth every 6 (six) hours as needed.        No Known Allergies  ROS Review of Systems  Constitutional: Negative for fever, chills, appetite change and fatigue.  HENT: Positive for dental problem (all teeth in disrepair, many teeth missing). Negative for ear pain, congestion, sore throat and neck stiffness.   Eyes: Negative for discharge, redness and visual disturbance.  Respiratory: Negative for cough, chest tightness, shortness of breath and wheezing.   Cardiovascular: Negative for chest pain, palpitations and leg swelling.  Gastrointestinal: Negative for nausea, vomiting, abdominal pain, diarrhea and blood in stool.  Genitourinary: Negative for dysuria, urgency, frequency, hematuria, flank pain and difficulty urinating.  Musculoskeletal: Negative for myalgias, back pain, joint swelling and arthralgias.  Skin: Negative for pallor and rash.  Neurological: Negative for dizziness, speech difficulty, weakness and headaches.  Hematological: Negative for adenopathy. Does not bruise/bleed easily.  Psychiatric/Behavioral: Negative for confusion and sleep disturbance. The patient is not nervous/anxious.     PE; Blood pressure 130/80, pulse 64, temperature 98.1 F (36.7 C), temperature source Temporal, height 5\' 7"  (1.702 m), weight 197 lb 6.4 oz (89.54 kg), SpO2 95.00%. Gen: Alert, well appearing.  Patient is oriented to person, place, time, and situation. ENT: Ears: EACs clear, normal epithelium.  TMs with good light reflex and landmarks bilaterally.  Eyes: no injection, icteris, swelling, or exudate.  EOMI, PERRLA. Nose: no drainage or turbinate edema/swelling.  No injection or focal lesion.  Mouth: lips without lesion/swelling.  Oral mucosa pink and moist.  Dentition in extensive disrepair, many teeth missing or chipped, no gingival swelling.  Oropharynx without erythema, exudate, or swelling.  Neck - No masses or thyromegaly or limitation in range of motion CV: RRR, no m/r/g.   LUNGS: CTA  bilat, nonlabored resps, good aeration in all lung fields. EXT: no clubbing, cyanosis, or edema.   Pertinent labs:  None today  ASSESSMENT AND PLAN:   New patient: obtain old records  DIABETES MELLITUS, TYPE II, CONTROLLED Pt recalls last HbA1c "about 7", and says labs done by Silas Sacramento about 04/2011. Will continue current meds and wait on any further labs/HbA1c until next f/u in 57mo for CPE. He admits his winter diet is not very good, also admits to poor glucose monitoring at home.  Encouraged him to improve in both areas. He'll try to get diab rtpthy screen ASAP but money is the issue here,  so we'll see. Obtain old primary care records.  HYPERTENSION, BENIGN Problem stable.  Continue current medications and diet appropriate for this condition.  We have reviewed our general long term plan for this problem and also reviewed symptoms and signs that should prompt the patient to call or return to the office. Keep f/u with Dr. [name].  Pt says Dr. [name] is monitoring/managing his hyperlipidemia.  TOBACCO ABUSE Encouraged complete cessation. Briefly discussed options to help him quit but he is aware of these and siimply says "I need to decide to just do it myself".  History of bladder cancer Continue appropriate f/u with urologist, Dr. Laverle Patter. Pt reports that Dr. Laverle Patter also follows his PSA's. Obtain records.  CAD, NATIVE VESSEL Ongoing routine f/u with Dr. [name].  Continue current meds. Stress testing normal 2012 prior to urologic surgery.      Return in about 3 months (around 11/13/2011) for CPE.

## 2011-08-15 NOTE — Assessment & Plan Note (Signed)
Encouraged complete cessation. Briefly discussed options to help him quit but he is aware of these and siimply says "I need to decide to just do it myself".

## 2011-08-15 NOTE — Telephone Encounter (Signed)
Faxed request

## 2011-08-15 NOTE — Assessment & Plan Note (Signed)
Continue appropriate f/u with urologist, Dr. Laverle Patter. Pt reports that Dr. Laverle Patter also follows his PSA's. Obtain records.

## 2011-08-15 NOTE — Telephone Encounter (Signed)
Pls request records from Dr. Laverle Patter with [facility] and from Silas Sacramento, FNP --thx.

## 2011-08-17 ENCOUNTER — Encounter: Payer: Self-pay | Admitting: Family Medicine

## 2011-08-26 ENCOUNTER — Other Ambulatory Visit: Payer: Self-pay | Admitting: *Deleted

## 2011-08-26 MED ORDER — ATENOLOL 50 MG PO TABS: 50.0000 mg | ORAL_TABLET | Freq: Every day | ORAL | Status: DC

## 2011-08-26 NOTE — Telephone Encounter (Signed)
Last seen 08/15/11, CPE needed in 10/2011. 90 day supply sent. 

## 2011-08-30 ENCOUNTER — Other Ambulatory Visit: Payer: Self-pay | Admitting: Family Medicine

## 2011-08-31 MED ORDER — GLIPIZIDE ER 2.5 MG PO TB24: 2.5000 mg | ORAL_TABLET | Freq: Every day | ORAL | Status: DC

## 2011-08-31 MED ORDER — SIMVASTATIN 40 MG PO TABS: 40.0000 mg | ORAL_TABLET | Freq: Every evening | ORAL | Status: DC

## 2011-08-31 NOTE — Telephone Encounter (Signed)
Last seen 08/15/11, CPE needed in 10/2011. 90 day supply sent.

## 2011-09-23 ENCOUNTER — Other Ambulatory Visit: Payer: Self-pay | Admitting: Family Medicine

## 2011-09-23 DIAGNOSIS — R972 Elevated prostate specific antigen [PSA]: Secondary | ICD-10-CM

## 2011-09-23 HISTORY — DX: Elevated prostate specific antigen (PSA): R97.20

## 2011-09-23 MED ORDER — METFORMIN HCL 500 MG PO TABS: 500.0000 mg | ORAL_TABLET | Freq: Two times a day (BID) | ORAL | Status: DC

## 2011-09-23 MED ORDER — LISINOPRIL 20 MG PO TABS: 20.0000 mg | ORAL_TABLET | Freq: Every day | ORAL | Status: DC

## 2011-09-23 NOTE — Telephone Encounter (Signed)
Requests 90 day refill.  Last seen 08/15/11, follow up 11/14/11 (CPE).  90 day RX's sent to pharmacy.

## 2011-09-26 ENCOUNTER — Ambulatory Visit (INDEPENDENT_AMBULATORY_CARE_PROVIDER_SITE_OTHER): Payer: Managed Care, Other (non HMO) | Admitting: Cardiology

## 2011-09-26 ENCOUNTER — Encounter: Payer: Self-pay | Admitting: Cardiology

## 2011-09-26 VITALS — BP 116/77 | HR 68 | Wt 195.0 lb

## 2011-09-26 DIAGNOSIS — E7800 Pure hypercholesterolemia, unspecified: Secondary | ICD-10-CM

## 2011-09-26 LAB — BASIC METABOLIC PANEL
BUN: 16 mg/dL (ref 6–23)
Calcium: 9.4 mg/dL (ref 8.4–10.5)
Creatinine, Ser: 1.1 mg/dL (ref 0.4–1.5)
GFR: 73.52 mL/min (ref >60.00)
Glucose, Bld: 175 mg/dL — ABNORMAL HIGH (ref 70–99)

## 2011-09-26 LAB — LIPID PANEL
Cholesterol: 137 mg/dL (ref 0–200)
VLDL: 29.6 mg/dL (ref 0.0–40.0)

## 2011-09-26 LAB — HEPATIC FUNCTION PANEL
ALT: 27 U/L (ref 0–53)
AST: 24 U/L (ref 0–37)
Alkaline Phosphatase: 60 U/L (ref 39–117)
Bilirubin, Direct: 0.1 mg/dL (ref 0.0–0.3)
Total Bilirubin: 0.9 mg/dL (ref 0.3–1.2)
Total Protein: 7.3 g/dL (ref 6.0–8.3)

## 2011-09-26 NOTE — Assessment & Plan Note (Signed)
Patient discontinued his tobacco abuse.

## 2011-09-26 NOTE — Assessment & Plan Note (Signed)
Continue statin. Check lipids and liver. 

## 2011-09-26 NOTE — Assessment & Plan Note (Signed)
Continue aspirin and statin. Last Myoview was normal. Continue risk factor modification.

## 2011-09-26 NOTE — Progress Notes (Signed)
Addended by: Tonita Phoenix on: 09/26/2011 09:46 AM   Modules accepted: Orders

## 2011-09-26 NOTE — Patient Instructions (Signed)
Your physician recommends that you have lab today lipids,liver,bmet

## 2011-09-26 NOTE — Progress Notes (Signed)
HPI: Mr. Colley is a pleasant gentleman who has a history of coronary disease status post drug-eluting stent to the circumflex in August 2005. His last Myoview was performed in Feb 2012.  The patient's ejection fraction was 55%.  There was no sign of scar or ischemia. I last saw him in March of 2012. Since then the patient denies any dyspnea on exertion, orthopnea, PND, pedal edema, palpitations, syncope or chest pain.   Current Outpatient Prescriptions  Medication Sig Dispense Refill  . acetaminophen (TYLENOL) 500 MG tablet Take 1,000 mg by mouth as needed.      Marland Kitchen aspirin 81 MG tablet Take 81 mg by mouth daily.       Marland Kitchen atenolol (TENORMIN) 50 MG tablet Take 1 tablet (50 mg total) by mouth daily.  90 tablet  0  . fluticasone (FLONASE) 50 MCG/ACT nasal spray Place 2 sprays into the nose daily.      Marland Kitchen glipiZIDE (GLUCOTROL XL) 2.5 MG 24 hr tablet Take 1 tablet (2.5 mg total) by mouth daily.  90 tablet  0  . lisinopril (PRINIVIL,ZESTRIL) 20 MG tablet Take 1 tablet (20 mg total) by mouth daily.  90 tablet  0  . metFORMIN (GLUCOPHAGE) 500 MG tablet Take 1 tablet (500 mg total) by mouth 2 (two) times daily with a meal.  180 tablet  0  . phenazopyridine (PYRIDIUM) 200 MG tablet Take 200 mg by mouth every 8 (eight) hours as needed.      . simvastatin (ZOCOR) 40 MG tablet Take 1 tablet (40 mg total) by mouth every evening.  90 tablet  0  . Tamsulosin HCl (FLOMAX) 0.4 MG CAPS Take 0.4 mg by mouth daily.      . traMADol (ULTRAM) 50 MG tablet Take 50 mg by mouth every 6 (six) hours as needed.         Past Medical History  Diagnosis Date  . History of bladder cancer 2012    Mass removed + gets ongoing BCG bladder infusion treatments with urologist Laverle Patter).  Marland Kitchen CAD (coronary artery disease)      '05 MI > DES to circumflex  . HTN (hypertension)   . Hyperlipidemia   . Diabetes mellitus type 2, controlled   . Wolf-Parkinson-White syndrome   . Tobacco abuse   . BPH (benign prostatic hyperplasia)    with hx of acute prostatitis    Past Surgical History  Procedure Date  . Bladder tumor excision 11/08/10    urothelial carcinoma (Dr. Laverle Patter).  . Myocardial perfusion study 2009    Neg ischemia, EF 52%  . Lexiscan 2012    Normal/negative  . Lumbar spine surgery 1989 & 1991    Neurosurgeon in [place] ([facility])-no trouble since last surgery  . Colonoscopy w/ polypectomy 2008    ?hyperplastic per pt report repeat 10 yrs (need old records)    History   Social History  . Marital Status: Married    Spouse Name: N/A    Number of Children: N/A  . Years of Education: N/A   Occupational History  . Not on file.   Social History Main Topics  . Smoking status: Current Everyday Smoker -- 30 years    Types: Cigarettes  . Smokeless tobacco: Never Used   Comment: 2-3 cigs a day  . Alcohol Use: No  . Drug Use: No  . Sexually Active: Not on file   Other Topics Concern  . 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    ROS: Recent URI/congestion but no fevers or chills,  hemoptysis, dysphasia, odynophagia, melena, hematochezia, dysuria, hematuria, rash, seizure activity, orthopnea, PND, pedal edema, claudication. Remaining systems are negative.  Physical Exam: Well-developed well-nourished in no acute distress.  Skin is warm and dry.  HEENT is normal.  Neck is supple. No thyromegaly.  Chest is clear to auscultation with normal expansion.  Cardiovascular exam is regular rate and rhythm.  Abdominal exam nontender or distended. No masses palpated. Extremities show no edema. neuro grossly intact  ECG sinus rhythm at a rate of 68. No ST changes.

## 2011-09-26 NOTE — Assessment & Plan Note (Signed)
Blood pressure controlled. Continue present medications. Check potassium and renal function. 

## 2011-11-14 ENCOUNTER — Encounter: Payer: Managed Care, Other (non HMO) | Admitting: Family Medicine

## 2011-11-23 ENCOUNTER — Other Ambulatory Visit: Payer: Self-pay

## 2011-11-23 MED ORDER — ATENOLOL 50 MG PO TABS: 50.0000 mg | ORAL_TABLET | Freq: Every day | ORAL | Status: DC

## 2011-11-28 ENCOUNTER — Other Ambulatory Visit: Payer: Self-pay | Admitting: Family Medicine

## 2011-11-28 ENCOUNTER — Ambulatory Visit (INDEPENDENT_AMBULATORY_CARE_PROVIDER_SITE_OTHER): Payer: Managed Care, Other (non HMO) | Admitting: Family Medicine

## 2011-11-28 ENCOUNTER — Encounter: Payer: Self-pay | Admitting: Family Medicine

## 2011-11-28 ENCOUNTER — Other Ambulatory Visit: Payer: Self-pay | Admitting: *Deleted

## 2011-11-28 VITALS — BP 108/71 | HR 62 | Temp 97.4°F | Ht 66.5 in | Wt 190.0 lb

## 2011-11-28 DIAGNOSIS — Z87891 Personal history of nicotine dependence: Secondary | ICD-10-CM

## 2011-11-28 DIAGNOSIS — I10 Essential (primary) hypertension: Secondary | ICD-10-CM

## 2011-11-28 DIAGNOSIS — N402 Nodular prostate without lower urinary tract symptoms: Secondary | ICD-10-CM | POA: Insufficient documentation

## 2011-11-28 DIAGNOSIS — Z0000 Encounter for general adult medical examination without abnormal findings: Secondary | ICD-10-CM | POA: Insufficient documentation

## 2011-11-28 DIAGNOSIS — E119 Type 2 diabetes mellitus without complications: Secondary | ICD-10-CM

## 2011-11-28 DIAGNOSIS — R972 Elevated prostate specific antigen [PSA]: Secondary | ICD-10-CM

## 2011-11-28 DIAGNOSIS — F17201 Nicotine dependence, unspecified, in remission: Secondary | ICD-10-CM | POA: Insufficient documentation

## 2011-11-28 DIAGNOSIS — Z8551 Personal history of malignant neoplasm of bladder: Secondary | ICD-10-CM

## 2011-11-28 LAB — MICROALBUMIN / CREATININE URINE RATIO
Creatinine,U: 195 mg/dL
Microalb Creat Ratio: 2.9 mg/g (ref 0.0–30.0)

## 2011-11-28 LAB — HEMOGLOBIN A1C: Hgb A1c MFr Bld: 7.3 % — ABNORMAL HIGH (ref 4.6–6.5)

## 2011-11-28 MED ORDER — SIMVASTATIN 40 MG PO TABS: 40.0000 mg | ORAL_TABLET | Freq: Every evening | ORAL | Status: DC

## 2011-11-28 MED ORDER — METFORMIN HCL 1000 MG PO TABS: 1000.0000 mg | ORAL_TABLET | Freq: Two times a day (BID) | ORAL | Status: DC

## 2011-11-28 NOTE — Progress Notes (Signed)
Office Note 11/28/2011  CC:  Chief Complaint  Patient presents with  . Annual Exam    no complaints    HPI:  Mark Blankenship is a 56 y.o. White male who is here for annual CPE. He recently saw his cardiologist and this report was good:  FLP, CMET normal.  No med changes or testing recommended. His urologist checks his PSA's and does DREs, last one done 09/2011 showed new prostate nodule and his PSA had risen from 0.71 07/2010 to 4.6.  Biopsy was done and pt reports this was "all clear".  No record available yet.   He is still not monitoring his glucoses at home, says he is trying to eat better. Denies pain, numbness, or tingling in extremities. He has still not made appt for his annual diab retinopthy screen. Has not made appt with dentist yet.  Past Medical History  Diagnosis Date  . History of bladder cancer 2012    Mass removed + gets ongoing BCG bladder infusion treatments with urologist Laverle Patter).  Marland Kitchen CAD (coronary artery disease)      '05 MI > DES to circumflex  . HTN (hypertension)   . Hyperlipidemia   . Diabetes mellitus type 2, controlled   . Wolf-Parkinson-White syndrome   . Tobacco abuse   . BPH (benign prostatic hyperplasia)     with hx of acute prostatitis    Past Surgical History  Procedure Date  . Bladder tumor excision 11/08/10    urothelial carcinoma (Dr. Laverle Patter).  . Myocardial perfusion study 2009    Neg ischemia, EF 52%  . Lexiscan 2012    Normal/negative  . Lumbar spine surgery 1989 & 1991    Neurosurgeon in [place] ([facility])-no trouble since last surgery  . Colonoscopy w/ polypectomy 2008    ?hyperplastic per pt report repeat 10 yrs (need old records)    Family History  Problem Relation Age of Onset  . Hypertension Mother   . Cancer Father     colon and prostate, mets to lungs.  . Hypertension Father   . Heart disease Brother     d. 61 MI  . Multiple sclerosis Brother   . Diabetes Brother   . Heart disease Paternal Uncle     History    Social History  . Marital Status: Married    Spouse Name: N/A    Number of Children: N/A  . Years of Education: N/A   Occupational History  . Not on file.   Social History Main Topics  . Smoking status: Former Smoker -- 30 years    Types: Cigarettes  . Smokeless tobacco: Never Used   Comment: 2-3 cigs a day  . Alcohol Use: No  . Drug Use: No  . Sexually Active: Not on file   Other Topics Concern  . 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    Outpatient Prescriptions Prior to Visit  Medication Sig Dispense Refill  . acetaminophen (TYLENOL) 500 MG tablet Take 1,000 mg by mouth as needed.      Marland Kitchen aspirin 81 MG tablet Take 81 mg by mouth daily.       Marland Kitchen atenolol (TENORMIN) 50 MG tablet Take 1 tablet (50 mg total) by  mouth daily.  90 tablet  0  . fluticasone (FLONASE) 50 MCG/ACT nasal spray Place 2 sprays into the nose daily.      Marland Kitchen glipiZIDE (GLUCOTROL XL) 2.5 MG 24 hr tablet Take 1 tablet (2.5 mg total) by mouth daily.  90 tablet  0  . lisinopril (PRINIVIL,ZESTRIL) 20 MG tablet Take 1 tablet (20 mg total) by mouth daily.  90 tablet  0  . metFORMIN (GLUCOPHAGE) 500 MG tablet Take 1 tablet (500 mg total) by mouth 2 (two) times daily with a meal.  180 tablet  0  . phenazopyridine (PYRIDIUM) 200 MG tablet Take 200 mg by mouth every 8 (eight) hours as needed.      . simvastatin (ZOCOR) 40 MG tablet Take 1 tablet (40 mg total) by mouth every evening.  90 tablet  0  . Tamsulosin HCl (FLOMAX) 0.4 MG CAPS Take 0.4 mg by mouth daily.      . traMADol (ULTRAM) 50 MG tablet Take 50 mg by mouth every 6 (six) hours as needed.        No Known Allergies  ROS Review of Systems  Constitutional:  Negative for fever, chills, appetite change and fatigue.  HENT: Negative for ear pain, congestion, sore throat, neck stiffness and dental problem.   Eyes: Negative for discharge, redness and visual disturbance.  Respiratory: Negative for cough, chest tightness, shortness of breath and wheezing.   Cardiovascular: Negative for chest pain, palpitations and leg swelling.  Gastrointestinal: Negative for nausea, vomiting, abdominal pain, diarrhea and blood in stool.  Genitourinary: Negative for dysuria, urgency, frequency, hematuria, flank pain and difficulty urinating.  Musculoskeletal: Negative for myalgias, back pain, joint swelling and arthralgias.  Skin: Negative for pallor and rash.  Neurological: Negative for dizziness, speech difficulty, weakness and headaches.  Hematological: Negative for adenopathy. Does not bruise/bleed easily.  Psychiatric/Behavioral: Negative for confusion and sleep disturbance. The patient is not nervous/anxious.     PE; Blood pressure 108/71, pulse 62, temperature 97.4 F (36.3 C), height 5' 6.5" (1.689 m), weight 190 lb (86.183 kg). Gen: Alert, well appearing.  Patient is oriented to person, place, time, and situation. ENT: Ears: EACs clear, normal epithelium.  TMs with good light reflex and landmarks bilaterally.  Eyes: no injection, icteris, swelling, or exudate.  EOMI, PERRLA. Nose: no drainage or turbinate edema/swelling.  No injection or focal lesion.  Mouth: lips without lesion/swelling.  Oral mucosa pink and moist.  Dentition in extensive disrepair/chipped/eroded/discolored, many missing. Oropharynx without erythema, exudate, or swelling.  Neck: supple/nontender.  No LAD, mass, or TM.  Carotid pulses 2+ bilaterally, without bruits. CV: RRR, no m/r/g.   LUNGS: CTA bilat, nonlabored resps, good aeration in all lung fields. ABD: soft, NT, ND, BS normal.  No hepatospenomegaly or mass.  No bruits. EXT: no clubbing, cyanosis, or edema.   Full diabetic foot exam: no  skin breakdown or calluses, color pink, cap refill brisk, DP and PT pulses 2+, sensation intact in all areas with monofilament testing today.   Skin - no sores or suspicious lesions or rashes or color changes.  Left posterior shoulder region with small pink papule (barely palpable) with no tenderness and no surrounding erythema, no streaking.   Pertinent labs:  None today  ASSESSMENT AND PLAN:   Health maintenance examination Reviewed age and gender appropriate health maintenance issues (prudent diet, regular exercise, health risks of tobacco and excessive alcohol, use of seatbelts, fire alarms in home, use of sunscreen).  Also reviewed age and gender appropriate health screening  as well as vaccine recommendations. Encouraged pt to make dental health a priority: needs all teeth removed.  Tobacco dependence in remission Congratulated pt on this accomplishment.  DIABETES MELLITUS, TYPE II, CONTROLLED Noncompliant with monitoring.  Compliant with meds, fairly compliant with dieting.  Exercise is minimal. Check HbA1c today as well as urine microal/cr.   Reminded pt to keep annual diab retinopthy eye screening on his list of medical priorities, although he admittedly has a lot going on lately and it has been tough to fit this in.   Elevated PSA With prostate nodule: recent prostate biopsy negative per pt report, need to obtain records.  He reports that their plan is to follow up PSA at the end of this month.  History of bladder cancer Most recent urine cytology negative (2013). Plans are in place, per pt, to get surveillance CT of kidneys and ureters plus repeat cystoscopy in the next couple of months.  He'll then restart BCG bladder treatments.     FOLLOW UP:  Return in about 4 months (around 03/30/2012) for f/u DM 2.

## 2011-11-28 NOTE — Assessment & Plan Note (Signed)
Congratulated pt on this accomplishment.

## 2011-11-28 NOTE — Assessment & Plan Note (Addendum)
Reviewed age and gender appropriate health maintenance issues (prudent diet, regular exercise, health risks of tobacco and excessive alcohol, use of seatbelts, fire alarms in home, use of sunscreen).  Also reviewed age and gender appropriate health screening as well as vaccine recommendations. Encouraged pt to make dental health a priority: needs all teeth removed.

## 2011-11-28 NOTE — Patient Instructions (Signed)
Health Maintenance, Males A healthy lifestyle and preventative care can promote health and wellness.  Maintain regular health, dental, and eye exams.   Eat a healthy diet. Foods like vegetables, fruits, whole grains, low-fat dairy products, and lean protein foods contain the nutrients you need without too many calories. Decrease your intake of foods high in solid fats, added sugars, and salt. Get information about a proper diet from your caregiver, if necessary.   Regular physical exercise is one of the most important things you can do for your health. Most adults should get at least 150 minutes of moderate-intensity exercise (any activity that increases your heart rate and causes you to sweat) each week. In addition, most adults need muscle-strengthening exercises on 2 or more days a week.    Maintain a healthy weight. The body mass index (BMI) is a screening tool to identify possible weight problems. It provides an estimate of body fat based on height and weight. Your caregiver can help determine your BMI, and can help you achieve or maintain a healthy weight. For adults 20 years and older:   A BMI below 18.5 is considered underweight.   A BMI of 18.5 to 24.9 is normal.   A BMI of 25 to 29.9 is considered overweight.   A BMI of 30 and above is considered obese.   Maintain normal blood lipids and cholesterol by exercising and minimizing your intake of saturated fat. Eat a balanced diet with plenty of fruits and vegetables. Blood tests for lipids and cholesterol should begin at age 20 and be repeated every 5 years. If your lipid or cholesterol levels are high, you are over 50, or you are a high risk for heart disease, you may need your cholesterol levels checked more frequently.Ongoing high lipid and cholesterol levels should be treated with medicines, if diet and exercise are not effective.   If you smoke, find out from your caregiver how to quit. If you do not use tobacco, do not start.    If you choose to drink alcohol, do not exceed 2 drinks per day. One drink is considered to be 12 ounces (355 mL) of beer, 5 ounces (148 mL) of wine, or 1.5 ounces (44 mL) of liquor.   Avoid use of street drugs. Do not share needles with anyone. Ask for help if you need support or instructions about stopping the use of drugs.   High blood pressure causes heart disease and increases the risk of stroke. Blood pressure should be checked at least every 1 to 2 years. Ongoing high blood pressure should be treated with medicines if weight loss and exercise are not effective.   If you are 45 to 57 years old, ask your caregiver if you should take aspirin to prevent heart disease.   Diabetes screening involves taking a blood sample to check your fasting blood sugar level. This should be done once every 3 years, after age 45, if you are within normal weight and without risk factors for diabetes. Testing should be considered at a younger age or be carried out more frequently if you are overweight and have at least 1 risk factor for diabetes.   Colorectal cancer can be detected and often prevented. Most routine colorectal cancer screening begins at the age of 50 and continues through age 75. However, your caregiver may recommend screening at an earlier age if you have risk factors for colon cancer. On a yearly basis, your caregiver may provide home test kits to check for hidden   blood in the stool. Use of a small camera at the end of a tube, to directly examine the colon (sigmoidoscopy or colonoscopy), can detect the earliest forms of colorectal cancer. Talk to your caregiver about this at age 50, when routine screening begins. Direct examination of the colon should be repeated every 5 to 10 years through age 75, unless early forms of pre-cancerous polyps or small growths are found.   Hepatitis C blood testing is recommended for all people born from 1945 through 1965 and any individual with known risks for  hepatitis C.   Healthy men should no longer receive prostate-specific antigen (PSA) blood tests as part of routine cancer screening. Consult with your caregiver about prostate cancer screening.   Testicular cancer screening is not recommended for adolescents or adult males who have no symptoms. Screening includes self-exam, caregiver exam, and other screening tests. Consult with your caregiver about any symptoms you have or any concerns you have about testicular cancer.   Practice safe sex. Use condoms and avoid high-risk sexual practices to reduce the spread of sexually transmitted infections (STIs).   Use sunscreen with a sun protection factor (SPF) of 30 or greater. Apply sunscreen liberally and repeatedly throughout the day. You should seek shade when your shadow is shorter than you. Protect yourself by wearing long sleeves, pants, a wide-brimmed hat, and sunglasses year round, whenever you are outdoors.   Notify your caregiver of new moles or changes in moles, especially if there is a change in shape or color. Also notify your caregiver if a mole is larger than the size of a pencil eraser.   A one-time screening for abdominal aortic aneurysm (AAA) and surgical repair of large AAAs by sound wave imaging (ultrasonography) is recommended for ages 65 to 75 years who are current or former smokers.   Stay current with your immunizations.  Document Released: 01/07/2008 Document Revised: 06/30/2011 Document Reviewed: 12/06/2010 [employer] Patient Information 2012 [employer]. 

## 2011-11-28 NOTE — Telephone Encounter (Signed)
Faxed refill request received from pharmacy for Simvastatin Last filled by MD on 08/31/11 Last seen on 11/28/11 Follow up 03/2012 RX sent.  Faxed refill request received from pharmacy for Glipizide Last filled by MD on 08/31/11 Last seen on 11/28/11 Follow up 03/2012 Labs drawn today, will await results to fill in case dose changes needed.

## 2011-11-28 NOTE — Assessment & Plan Note (Signed)
With prostate nodule: recent prostate biopsy negative per pt report, need to obtain records.  He reports that their plan is to follow up PSA at the end of this month.

## 2011-11-28 NOTE — Assessment & Plan Note (Signed)
Noncompliant with monitoring.  Compliant with meds, fairly compliant with dieting.  Exercise is minimal. Check HbA1c today as well as urine microal/cr.   Reminded pt to keep annual diab retinopthy eye screening on his list of medical priorities, although he admittedly has a lot going on lately and it has been tough to fit this in.

## 2011-11-28 NOTE — Assessment & Plan Note (Signed)
Most recent urine cytology negative (2013). Plans are in place, per pt, to get surveillance CT of kidneys and ureters plus repeat cystoscopy in the next couple of months.  He'll then restart BCG bladder treatments.

## 2011-11-29 MED ORDER — GLIPIZIDE ER 2.5 MG PO TB24: 2.5000 mg | ORAL_TABLET | Freq: Every day | ORAL | Status: DC

## 2011-11-29 NOTE — Telephone Encounter (Signed)
Labs returned.  No changes to glipizide RX.  RX sent.

## 2011-12-23 ENCOUNTER — Other Ambulatory Visit: Payer: Self-pay | Admitting: *Deleted

## 2011-12-23 MED ORDER — LISINOPRIL 20 MG PO TABS: 20.0000 mg | ORAL_TABLET | Freq: Every day | ORAL | Status: DC

## 2011-12-23 NOTE — Telephone Encounter (Signed)
Faxed refill request received from pharmacy for lisinopril Last filled by MD on 09/23/11, #90 x 0 Last seen on 11/28/11 Follow up 03/2012 RX sent.

## 2012-01-03 NOTE — Progress Notes (Signed)
Addended by: Reine Just on: 01/03/2012 12:21 PM   Modules accepted: Orders

## 2012-01-16 ENCOUNTER — Encounter: Payer: Self-pay | Admitting: Family Medicine

## 2012-01-16 ENCOUNTER — Ambulatory Visit (INDEPENDENT_AMBULATORY_CARE_PROVIDER_SITE_OTHER): Payer: Managed Care, Other (non HMO) | Admitting: Family Medicine

## 2012-01-16 VITALS — BP 146/92 | HR 71 | Ht 66.5 in | Wt 188.0 lb

## 2012-01-16 DIAGNOSIS — K089 Disorder of teeth and supporting structures, unspecified: Secondary | ICD-10-CM

## 2012-01-16 DIAGNOSIS — K0889 Other specified disorders of teeth and supporting structures: Secondary | ICD-10-CM | POA: Insufficient documentation

## 2012-01-16 DIAGNOSIS — J019 Acute sinusitis, unspecified: Secondary | ICD-10-CM | POA: Insufficient documentation

## 2012-01-16 MED ORDER — CEFTRIAXONE SODIUM 1 G IJ SOLR
1.0000 g | Freq: Once | INTRAMUSCULAR | Status: AC
  Administered 2012-01-16: 1 g via INTRAMUSCULAR

## 2012-01-16 MED ORDER — CEFDINIR 300 MG PO CAPS: 300.0000 mg | ORAL_CAPSULE | Freq: Two times a day (BID) | ORAL | Status: AC

## 2012-01-16 NOTE — Assessment & Plan Note (Addendum)
With the state of his dentition, I worry about significant tooth abscess/orofacial space infection (canine space) in the same region--central incisor region. I'll treat agressively with rocephin 1 g IM today and start augmentin 875/125 bid x 10d tonight.  Since he has no sign of systemic illness will not do any imaging, blood testing, or referral at this time.   F/u in office in 3 days.

## 2012-01-16 NOTE — Progress Notes (Addendum)
OFFICE NOTE  01/16/2012  CC:  Chief Complaint  Patient presents with  . Facial Swelling    sinuses and behind nose     HPI: Patient is a 56 y.o. Caucasian male who is here for "sinuses". Pt presents complaining of respiratory symptoms for 3-4 days.  Primary symptoms are: nasal congestion, PND, sneezing, mild ST.  Worst symptoms seems to be the pressure and pain behind nasal area and upper lip region that improved this AM when lots of blood tinged drainage came out.  Lately the symptoms seem to be improving (today since this morning).    Pertinent negatives: No fevers, no wheezing, and no SOB.  No significant HA.  ST mild at most.   Symptoms made worse by air conditioning.  Symptoms improved by release of pressure (by use of flonase the last 2d?). Smoker? quit Recent sick contact? unknown Muscle or joint aches? no  Additional ROS: no n/v/d or abdominal pain.  No rash.  No neck stiffness.   +Mild fatigue.  +Mild appetite loss. His glucoses were improving up until he ran out of test strips, so he doesn't know what they have been during this illness.  Of note, he is set to get maintenance BCG bladder infusion tomorrow at his urologist's office for his history of bladder cancer.   Pertinent PMH:  Past Medical History  Diagnosis Date  . History of bladder cancer 2012    Mass removed, plus gets ongoing BCG bladder infusions by urologist (Dr. Laverle Patter).  Marland Kitchen CAD (coronary artery disease)      '05 MI > DES to circumflex  . HTN (hypertension)   . Hyperlipidemia   . Diabetes mellitus type 2, controlled     HbA1c 02/2011 was 6.6%  . Wolf-Parkinson-White syndrome   . Tobacco abuse   . BPH (benign prostatic hyperplasia)     with hx of acute prostatitis  . Elevated PSA 09/2011    with prostate nodule.  Prostate bx ALL BENIGN 10/10/11 (Dr. Laverle Patter)    MEDS:  Outpatient Prescriptions Prior to Visit  Medication Sig Dispense Refill  . acetaminophen (TYLENOL) 500 MG tablet Take 1,000 mg by mouth  as needed.      Marland Kitchen aspirin 81 MG tablet Take 81 mg by mouth daily.       Marland Kitchen atenolol (TENORMIN) 50 MG tablet Take 1 tablet (50 mg total) by mouth daily.  90 tablet  0  . fluticasone (FLONASE) 50 MCG/ACT nasal spray Place 2 sprays into the nose daily.      Marland Kitchen glipiZIDE (GLUCOTROL XL) 2.5 MG 24 hr tablet Take 1 tablet (2.5 mg total) by mouth daily.  90 tablet  1  . lisinopril (PRINIVIL,ZESTRIL) 20 MG tablet Take 1 tablet (20 mg total) by mouth daily.  90 tablet  1  . metFORMIN (GLUCOPHAGE) 1000 MG tablet Take 1 tablet (1,000 mg total) by mouth 2 (two) times daily with a meal.  60 tablet  4  . phenazopyridine (PYRIDIUM) 200 MG tablet Take 200 mg by mouth every 8 (eight) hours as needed.      . simvastatin (ZOCOR) 40 MG tablet Take 1 tablet (40 mg total) by mouth every evening.  90 tablet  1  . Tamsulosin HCl (FLOMAX) 0.4 MG CAPS Take 0.4 mg by mouth daily.      . traMADol (ULTRAM) 50 MG tablet Take 50 mg by mouth every 6 (six) hours as needed.       No facility-administered medications prior to visit.  PE: Blood pressure 146/92, pulse 71, height 5' 6.5" (1.689 m), weight 188 lb (85.276 kg). Gen: Alert, well appearing.  Patient is oriented to person, place, time, and situation. ENT: Ears: EACs clear, normal epithelium.  TMs with good light reflex and landmarks bilaterally.  Eyes: no injection, icteris, swelling, or exudate.  EOMI, PERRLA. Nose: no drainage but mild turbinate edema/swelling/injection is noted in both nares.  No blood or focal lesion in nose.  The paranasal sinus regions are mildly tender, without swelling.  He has more prominent tenderness and mild swelling in the philtrum region.  On the underside of the upper lip I can see a hematoma in the gingiva and a bit of fresh blood seeping from this.  His hard and soft palate and pharynx all appear normal.  His dentition is very bad: he has many missing teeth and the teeth he does have are extensively discolored and eroded.     LAB:   Lab  Results  Component Value Date   HGBA1C 7.3* 11/28/2011    IMPRESSION AND PLAN:  Sinusitis acute With the state of his dentition, I worry about significant tooth abscess/orofacial space infection (canine space) in the same region--central incisor region. I'll treat agressively with rocephin 1 g IM today and start augmentin 875/125 bid x 10d tonight.  Since he has no sign of systemic illness will not do any imaging, blood testing, or referral at this time.   F/u in office in 3 days.  Continue flonase, add saline nasal spray several times per day, and apply ice pack to upper lip region for 20 min several times per day.   FOLLOW UP: 3d

## 2012-01-19 ENCOUNTER — Encounter: Payer: Self-pay | Admitting: Family Medicine

## 2012-01-19 ENCOUNTER — Ambulatory Visit (INDEPENDENT_AMBULATORY_CARE_PROVIDER_SITE_OTHER): Payer: Managed Care, Other (non HMO) | Admitting: Family Medicine

## 2012-01-19 VITALS — BP 130/90 | HR 68 | Ht 66.5 in | Wt 187.0 lb

## 2012-01-19 DIAGNOSIS — J019 Acute sinusitis, unspecified: Secondary | ICD-10-CM

## 2012-01-19 NOTE — Progress Notes (Signed)
OFFICE NOTE  01/19/2012  CC:  Chief Complaint  Patient presents with  . Follow-up    sinus and dental infection; feeling better; swelling decreased     HPI: Patient is a 55 y.o. Caucasian male who is here for 3 day f/u sinusitis/dental abscess. Feeling 75% better.  Pain in nasal region and upper lip have gone away.  No fevers.   Pertinent PMH:  Poor dentition DM 2 MEDS:  Outpatient Prescriptions Prior to Visit  Medication Sig Dispense Refill  . acetaminophen (TYLENOL) 500 MG tablet Take 1,000 mg by mouth as needed.      Marland Kitchen aspirin 81 MG tablet Take 81 mg by mouth daily.       Marland Kitchen atenolol (TENORMIN) 50 MG tablet Take 1 tablet (50 mg total) by mouth daily.  90 tablet  0  . cefdinir (OMNICEF) 300 MG capsule Take 1 capsule (300 mg total) by mouth 2 (two) times daily.  20 capsule  0  . fluticasone (FLONASE) 50 MCG/ACT nasal spray Place 2 sprays into the nose daily.      Marland Kitchen glipiZIDE (GLUCOTROL XL) 2.5 MG 24 hr tablet Take 1 tablet (2.5 mg total) by mouth daily.  90 tablet  1  . lisinopril (PRINIVIL,ZESTRIL) 20 MG tablet Take 1 tablet (20 mg total) by mouth daily.  90 tablet  1  . metFORMIN (GLUCOPHAGE) 1000 MG tablet Take 1 tablet (1,000 mg total) by mouth 2 (two) times daily with a meal.  60 tablet  4  . phenazopyridine (PYRIDIUM) 200 MG tablet Take 200 mg by mouth every 8 (eight) hours as needed.      . simvastatin (ZOCOR) 40 MG tablet Take 1 tablet (40 mg total) by mouth every evening.  90 tablet  1  . Tamsulosin HCl (FLOMAX) 0.4 MG CAPS Take 0.4 mg by mouth daily.      . traMADol (ULTRAM) 50 MG tablet Take 50 mg by mouth every 6 (six) hours as needed.        PE: Blood pressure 130/90, pulse 68, height 5' 6.5" (1.689 m), weight 187 lb (84.823 kg). Gen: Alert, well appearing.  Patient is oriented to person, place, time, and situation. ENT: nose with mild turbinate edema, no active drainage.  No paranasal sinus tenderness or swelling.  Philtrum is not swollen or tender.  Under the  upper lip there is no longer swelling or a hematoma. Extensive poor/eroded dentition unchanged.  IMPRESSION AND PLAN:  Sinusitis acute Greatly improved. Continue/finish augmentin. Go to dentist as soon as financially able.     FOLLOW UP: prn

## 2012-01-19 NOTE — Assessment & Plan Note (Signed)
Greatly improved. Continue/finish augmentin. Go to dentist as soon as financially able.

## 2012-02-22 ENCOUNTER — Other Ambulatory Visit: Payer: Self-pay | Admitting: *Deleted

## 2012-02-22 MED ORDER — ATENOLOL 50 MG PO TABS: 50.0000 mg | ORAL_TABLET | Freq: Every day | ORAL | Status: DC

## 2012-02-22 NOTE — Telephone Encounter (Signed)
Faxed refill request received from pharmacy for ATENOLOL Last filled by MD on 11/23/11, #90 X 0 Last seen on 11/28/11 Follow up IN 4 MONTHS RX SENT.

## 2012-04-03 ENCOUNTER — Encounter: Payer: Self-pay | Admitting: Family Medicine

## 2012-04-03 ENCOUNTER — Ambulatory Visit (INDEPENDENT_AMBULATORY_CARE_PROVIDER_SITE_OTHER): Payer: Managed Care, Other (non HMO) | Admitting: Family Medicine

## 2012-04-03 VITALS — BP 127/84 | HR 65 | Ht 66.5 in | Wt 188.0 lb

## 2012-04-03 DIAGNOSIS — E119 Type 2 diabetes mellitus without complications: Secondary | ICD-10-CM

## 2012-04-03 DIAGNOSIS — E7800 Pure hypercholesterolemia, unspecified: Secondary | ICD-10-CM

## 2012-04-03 DIAGNOSIS — J309 Allergic rhinitis, unspecified: Secondary | ICD-10-CM

## 2012-04-03 DIAGNOSIS — I10 Essential (primary) hypertension: Secondary | ICD-10-CM

## 2012-04-03 LAB — BASIC METABOLIC PANEL
CO2: 25 mEq/L (ref 19–32)
Chloride: 104 mEq/L (ref 96–112)
Glucose, Bld: 133 mg/dL — ABNORMAL HIGH (ref 70–99)
Potassium: 4.5 mEq/L (ref 3.5–5.1)
Sodium: 138 mEq/L (ref 135–145)

## 2012-04-03 MED ORDER — METFORMIN HCL 1000 MG PO TABS: 1000.0000 mg | ORAL_TABLET | Freq: Two times a day (BID) | ORAL | Status: DC

## 2012-04-03 NOTE — Assessment & Plan Note (Signed)
Lab Results  Component Value Date   CHOL 137 09/26/2011   HDL 32.60* 09/26/2011   LDLCALC 75 09/26/2011   LDLDIRECT 87.2 09/03/2010   TRIG 148.0 09/26/2011   CHOLHDL 4 09/26/2011   Problem stable.  Continue current medications and diet appropriate for this condition.  We have reviewed our general long term plan for this problem and also reviewed symptoms and signs that should prompt the patient to call or return to the office.

## 2012-04-03 NOTE — Assessment & Plan Note (Signed)
HOme monitoring good lately, diet improving. Check HbA1c today and continue current meds for now. Check cr/lytes today.

## 2012-04-03 NOTE — Assessment & Plan Note (Signed)
No sign of acute sinusitis today. Continue flonase qd, add OTC nonsedating antihistamine prn.

## 2012-04-03 NOTE — Progress Notes (Signed)
OFFICE NOTE  04/03/2012  CC:  Chief Complaint  Patient presents with  . Follow-up    DM, needs 90 day metformin RX     HPI: Patient is a 56 y.o. Caucasian male who is here for routine 4 mo f/u DM 2, HTN, hyperlipidemia.   Hasn't made it to eye MD or dentist yet. He reports morning glucoses 110-130 --he made some dietary changes prior to this glucose pattern.   Feeling pretty good lately except hay fever lately, uses flonase and it helps.  Was on zyrtec for years and stopped it b/c everything had resolved for a while.  Some coughing.  Lots of watery eyes and nasal congestion in mornings, sinus pressure/HA on and off.   Pertinent PMH:  Past Medical History  Diagnosis Date  . History of bladder cancer 2012    Mass removed, plus gets ongoing BCG bladder infusions by urologist (Dr. Laverle Patter).  Marland Kitchen CAD (coronary artery disease)      '05 MI > DES to circumflex  . HTN (hypertension)   . Hyperlipidemia   . Diabetes mellitus type 2, controlled     HbA1c 02/2011 was 6.6%  . Wolf-Parkinson-White syndrome   . Tobacco abuse   . BPH (benign prostatic hyperplasia)     with hx of acute prostatitis  . Elevated PSA 09/2011    with prostate nodule.  Prostate bx ALL BENIGN 10/10/11 (Dr. Laverle Patter)    MEDS:  Outpatient Prescriptions Prior to Visit  Medication Sig Dispense Refill  . acetaminophen (TYLENOL) 500 MG tablet Take 1,000 mg by mouth as needed.      Marland Kitchen aspirin 81 MG tablet Take 81 mg by mouth daily.       Marland Kitchen atenolol (TENORMIN) 50 MG tablet Take 1 tablet (50 mg total) by mouth daily.  90 tablet  1  . fluticasone (FLONASE) 50 MCG/ACT nasal spray Place 2 sprays into the nose daily.      Marland Kitchen glipiZIDE (GLUCOTROL XL) 2.5 MG 24 hr tablet Take 1 tablet (2.5 mg total) by mouth daily.  90 tablet  1  . lisinopril (PRINIVIL,ZESTRIL) 20 MG tablet Take 1 tablet (20 mg total) by mouth daily.  90 tablet  1  . metFORMIN (GLUCOPHAGE) 1000 MG tablet Take 1 tablet (1,000 mg total) by mouth 2 (two) times daily with a  meal.  60 tablet  4  . simvastatin (ZOCOR) 40 MG tablet Take 1 tablet (40 mg total) by mouth every evening.  90 tablet  1  . Tamsulosin HCl (FLOMAX) 0.4 MG CAPS Take 0.4 mg by mouth daily.      . traMADol (ULTRAM) 50 MG tablet Take 50 mg by mouth every 6 (six) hours as needed.      . phenazopyridine (PYRIDIUM) 200 MG tablet Take 200 mg by mouth every 8 (eight) hours as needed.        PE: Blood pressure 127/84, pulse 65, height 5' 6.5" (1.689 m), weight 188 lb (85.276 kg). ENT: Ears: EACs clear, normal epithelium.  TMs with good light reflex and landmarks bilaterally.  Eyes: no injection, icteris, swelling, or exudate.  EOMI, PERRLA. Nose: no drainage.  Mild edema and injection of nasal mucosa/turbinates. No injection or focal lesion.  Mouth: lips without lesion/swelling.  Oral mucosa pink and moist.   Oropharynx without erythema, exudate, or swelling.    IMPRESSION AND PLAN:  DIABETES MELLITUS, TYPE II, CONTROLLED HOme monitoring good lately, diet improving. Check HbA1c today and continue current meds for now. Check cr/lytes today.  HYPERTENSION, BENIGN Problem stable.  Continue current medications and diet appropriate for this condition.  We have reviewed our general long term plan for this problem and also reviewed symptoms and signs that should prompt the patient to call or return to the office.   HYPERCHOLESTEROLEMIA, PURE Lab Results  Component Value Date   CHOL 137 09/26/2011   HDL 32.60* 09/26/2011   LDLCALC 75 09/26/2011   LDLDIRECT 87.2 09/03/2010   TRIG 148.0 09/26/2011   CHOLHDL 4 09/26/2011   Problem stable.  Continue current medications and diet appropriate for this condition.  We have reviewed our general long term plan for this problem and also reviewed symptoms and signs that should prompt the patient to call or return to the office.   Allergic rhinitis No sign of acute sinusitis today. Continue flonase qd, add OTC nonsedating antihistamine prn.   Reminded him of  importance of getting in to see the dentist and the eye MD.  FOLLOW UP: 95mo

## 2012-04-03 NOTE — Assessment & Plan Note (Signed)
Problem stable.  Continue current medications and diet appropriate for this condition.  We have reviewed our general long term plan for this problem and also reviewed symptoms and signs that should prompt the patient to call or return to the office.  

## 2012-05-07 ENCOUNTER — Ambulatory Visit (INDEPENDENT_AMBULATORY_CARE_PROVIDER_SITE_OTHER): Payer: Managed Care, Other (non HMO)

## 2012-05-07 DIAGNOSIS — Z23 Encounter for immunization: Secondary | ICD-10-CM

## 2012-06-01 ENCOUNTER — Other Ambulatory Visit: Payer: Self-pay | Admitting: Family Medicine

## 2012-06-01 DIAGNOSIS — E119 Type 2 diabetes mellitus without complications: Secondary | ICD-10-CM

## 2012-06-01 MED ORDER — TAMSULOSIN HCL 0.4 MG PO CAPS: 0.4000 mg | ORAL_CAPSULE | Freq: Every day | ORAL | Status: DC

## 2012-06-01 MED ORDER — GLIPIZIDE ER 2.5 MG PO TB24: 2.5000 mg | ORAL_TABLET | Freq: Every day | ORAL | Status: DC

## 2012-06-01 MED ORDER — LISINOPRIL 20 MG PO TABS: 20.0000 mg | ORAL_TABLET | Freq: Every day | ORAL | Status: DC

## 2012-06-01 MED ORDER — SIMVASTATIN 40 MG PO TABS: 40.0000 mg | ORAL_TABLET | Freq: Every evening | ORAL | Status: DC

## 2012-06-01 MED ORDER — FLUTICASONE PROPIONATE 50 MCG/ACT NA SUSP: 2.0000 | Freq: Every day | NASAL | Status: DC

## 2012-06-01 MED ORDER — ATENOLOL 50 MG PO TABS: 50.0000 mg | ORAL_TABLET | Freq: Every day | ORAL | Status: DC

## 2012-06-01 MED ORDER — METFORMIN HCL 1000 MG PO TABS: 1000.0000 mg | ORAL_TABLET | Freq: Two times a day (BID) | ORAL | Status: DC

## 2012-06-01 NOTE — Telephone Encounter (Signed)
Patient needs a refill on Glipizide and simvastatin sent to [facility]

## 2012-06-01 NOTE — Addendum Note (Signed)
Addended by: Luisa Dago on: 06/01/2012 04:43 PM   Modules accepted: Orders

## 2012-06-01 NOTE — Telephone Encounter (Signed)
Refill request for GLIPIZIDE Last filled- 11/29/11, #90 X 1  Refill request for SIMVASTATIN Last filled- 11/28/11, #90 X 1 Last seen- 04/03/12 Follow up - 4 MONTHS, 08/02/12 RX SENT

## 2012-06-01 NOTE — Telephone Encounter (Signed)
Faxed refill request received from pharmacy for LISINIOPRIL Last filled- 11/28/11, #90 X 1  Last seen- 04/03/12  Follow up - 4 MONTHS, 08/02/12  RX SENT

## 2012-06-01 NOTE — Addendum Note (Signed)
Addended by: Francee Piccolo C on: 06/01/2012 03:00 PM   Modules accepted: Orders

## 2012-07-14 ENCOUNTER — Other Ambulatory Visit: Payer: Self-pay | Admitting: Family Medicine

## 2012-07-23 IMAGING — CR DG CHEST 2V
2 series · 2 of 2 positions shown · non-contrast
Comparison: Portable chest x-ray 02/28/2004.

CLINICAL DATA: Preoperative respiratory evaluation prior to a
urologic procedure.  Smoker with history of hypertension and
diabetes.

CHEST - 2 VIEW 08/25/2010:

[view not recorded (1 of 2)]
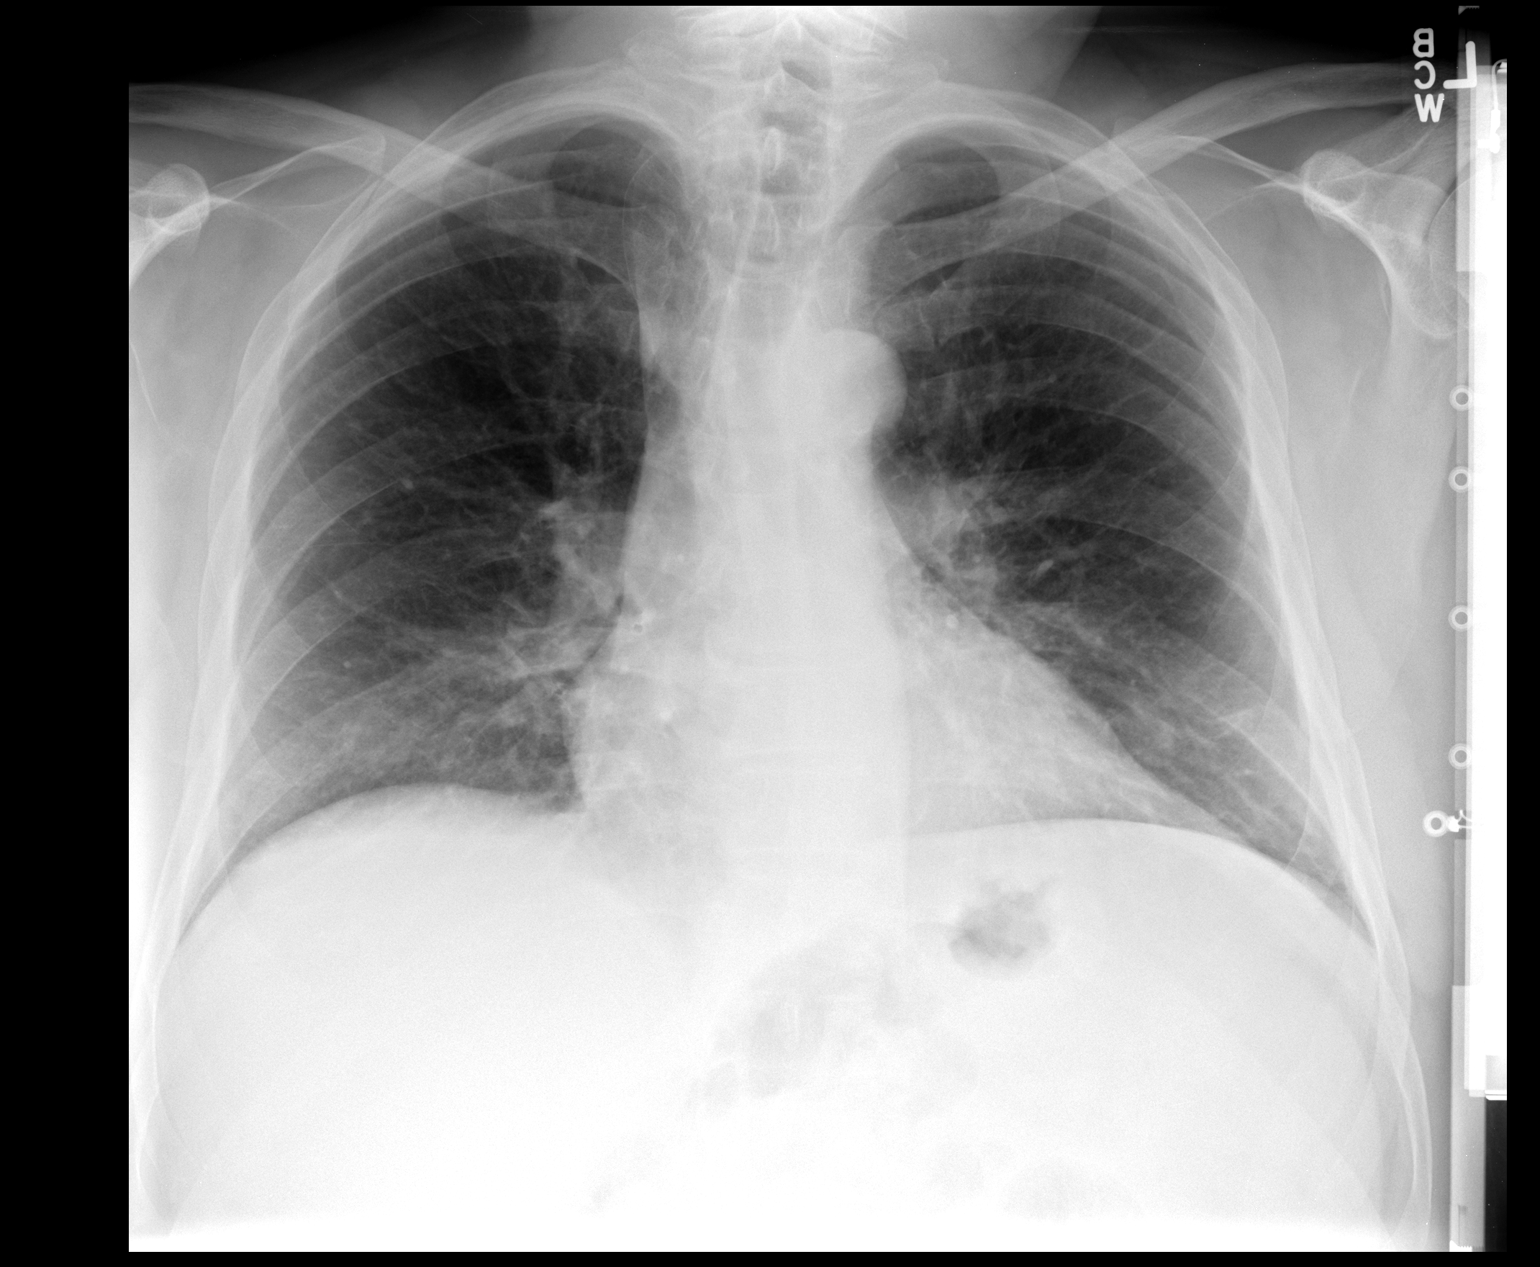

[view not recorded (2 of 2)]
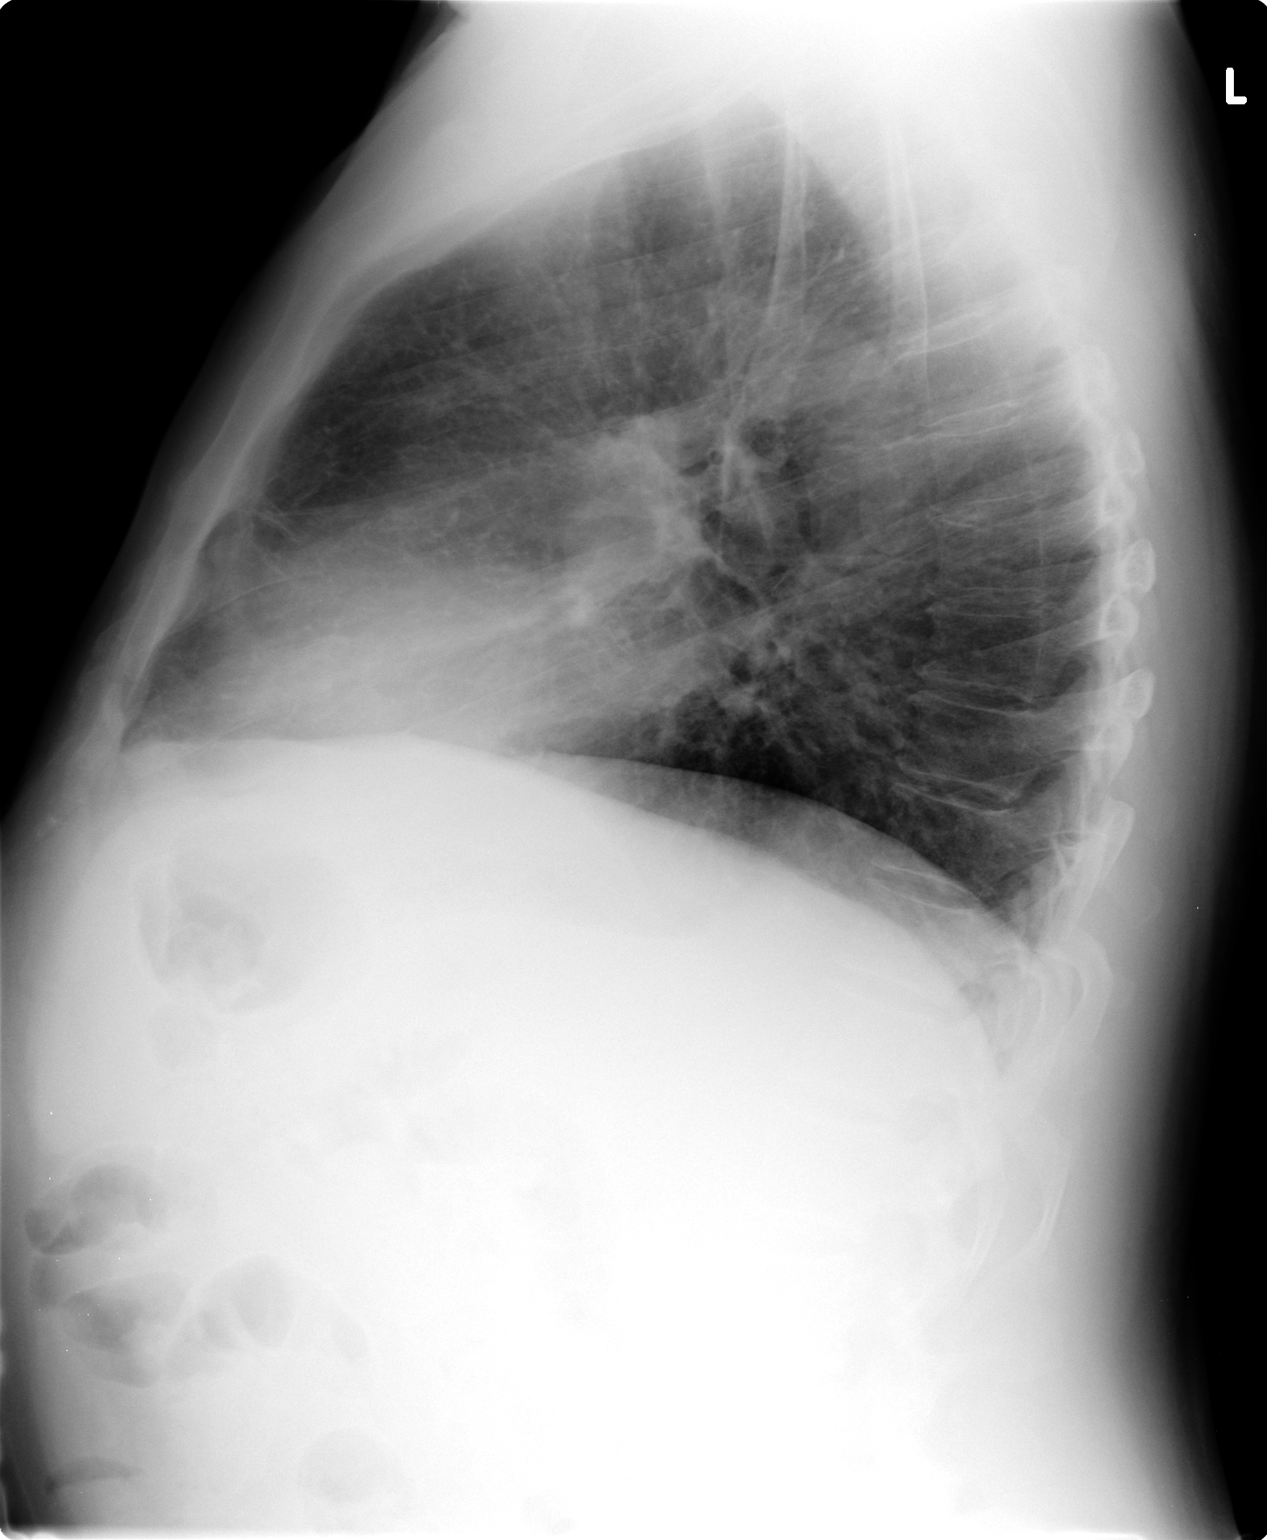

[2 of 2 positions shown; findings below may reference images not displayed]

FINDINGS: Cardiac silhouette upper normal in size, unchanged.
Stable linear scarring in the lingula.  Suboptimal inspiration
accounts for crowded bronchovascular markings at the bases; taking
this into account, lungs clear.  Prominent bronchovascular markings
diffusely and mild central peribronchial thickening, unchanged.
Thoracic aorta mildly tortuous, unchanged.  Hilar and mediastinal
contours otherwise unremarkable.  No pleural effusions.  Mild
degenerative changes involving the thoracic spine.
IMPRESSION: COPD (chronic bronchitis).  Linear scarring in the lingula.
Suboptimal inspiration.  No acute cardiopulmonary disease.

## 2012-08-02 ENCOUNTER — Ambulatory Visit (INDEPENDENT_AMBULATORY_CARE_PROVIDER_SITE_OTHER): Payer: Managed Care, Other (non HMO) | Admitting: Family Medicine

## 2012-08-02 ENCOUNTER — Encounter: Payer: Self-pay | Admitting: Family Medicine

## 2012-08-02 VITALS — BP 142/78 | HR 68 | Resp 16 | Ht 66.5 in | Wt 194.0 lb

## 2012-08-02 DIAGNOSIS — E119 Type 2 diabetes mellitus without complications: Secondary | ICD-10-CM

## 2012-08-02 DIAGNOSIS — I10 Essential (primary) hypertension: Secondary | ICD-10-CM

## 2012-08-02 NOTE — Assessment & Plan Note (Signed)
Problem stable.  Continue current medications and diet appropriate for this condition.  We have reviewed our general long term plan for this problem and also reviewed symptoms and signs that should prompt the patient to call or return to the office. Encouraged at least periodic home bp monitoring, reviewed goal bp of <130/80.

## 2012-08-02 NOTE — Assessment & Plan Note (Signed)
Historically his control has been fine. He'll get back on better diet/exercise routine now that holidays are over. No med changes today. HbA1c next f/u in 4 mo.

## 2012-08-02 NOTE — Progress Notes (Signed)
OFFICE NOTE  08/02/2012  CC:  Chief Complaint  Patient presents with  . Follow-up    DM, HTN, Lipids; doing well     HPI: Patient is a 57 y.o. Caucasian male who is here for 4 mo f/u DM 2, HTN, hyperlipidemia.   Doing well, no complaints.  Monitoring glucoses bid usually.  Fasting avg 140-150, 2H PP "up to 200 at times". Cites poor eating over the holidays as reason for poor control lately (plus relative inactivity due to cold weather and holiday activities).  No home bPs to report.  Denies CP, SOB, palpitations, dizzines, or HAs.  Pertinent PMH:  Past Medical History  Diagnosis Date  . History of bladder cancer 2012    Mass removed, plus gets ongoing BCG bladder infusions by urologist (Dr. Laverle Patter).  Marland Kitchen CAD (coronary artery disease)      '05 MI > DES to circumflex  . HTN (hypertension)   . Hyperlipidemia   . Diabetes mellitus type 2, controlled     HbA1c 02/2011 was 6.6%  . Wolf-Parkinson-White syndrome   . Tobacco abuse   . BPH (benign prostatic hyperplasia)     with hx of acute prostatitis  . Elevated PSA 09/2011    with prostate nodule.  Prostate bx ALL BENIGN 10/10/11 (Dr. Laverle Patter)    MEDS:  Outpatient Prescriptions Prior to Visit  Medication Sig Dispense Refill  . acetaminophen (TYLENOL) 500 MG tablet Take 1,000 mg by mouth as needed.      Marland Kitchen aspirin 81 MG tablet Take 81 mg by mouth daily.       Marland Kitchen atenolol (TENORMIN) 50 MG tablet Take 1 tablet (50 mg total) by mouth daily.  90 tablet  1  . fluticasone (FLONASE) 50 MCG/ACT nasal spray Place 2 sprays into the nose daily.  48 g  1  . glipiZIDE (GLUCOTROL XL) 2.5 MG 24 hr tablet Take 1 tablet (2.5 mg total) by mouth daily.  90 tablet  1  . lisinopril (PRINIVIL,ZESTRIL) 20 MG tablet Take 1 tablet (20 mg total) by mouth daily.  90 tablet  1  . metFORMIN (GLUCOPHAGE) 1000 MG tablet Take 1 tablet (1,000 mg total) by mouth 2 (two) times daily with a meal.  180 tablet  1  . phenazopyridine (PYRIDIUM) 200 MG tablet Take 200 mg by  mouth every 8 (eight) hours as needed.      . simvastatin (ZOCOR) 40 MG tablet Take 1 tablet (40 mg total) by mouth every evening.  90 tablet  1  . Tamsulosin HCl (FLOMAX) 0.4 MG CAPS Take 1 capsule (0.4 mg total) by mouth daily.  90 capsule  1  . traMADol (ULTRAM) 50 MG tablet Take 50 mg by mouth every 6 (six) hours as needed.      . [DISCONTINUED] lisinopril (PRINIVIL,ZESTRIL) 20 MG tablet TAKE 1 TABLET (20 MG TOTAL) BY MOUTH DAILY.  90 tablet  1   Last reviewed on 08/02/2012  8:44 AM by Jeoffrey Massed, MD  PE: Blood pressure 142/78, pulse 68, resp. rate 16, height 5' 6.5" (1.689 m), weight 194 lb (87.998 kg). Gen: Alert, well appearing.  Patient is oriented to person, place, time, and situation. CV: RRR, no m/r/g.   LUNGS: CTA bilat, nonlabored resps, good aeration in all lung fields. Waist circumference (measured for insurance form from his employer): 41 inches  IMPRESSION AND PLAN:  DIABETES MELLITUS, TYPE II, CONTROLLED Historically his control has been fine. He'll get back on better diet/exercise routine now that holidays are over.  No med changes today. HbA1c next f/u in 4 mo.  HYPERTENSION, BENIGN Problem stable.  Continue current medications and diet appropriate for this condition.  We have reviewed our general long term plan for this problem and also reviewed symptoms and signs that should prompt the patient to call or return to the office. Encouraged at least periodic home bp monitoring, reviewed goal bp of <130/80.     An After Visit Summary was printed and given to the patient.  FOLLOW UP: 51mo

## 2012-08-17 ENCOUNTER — Other Ambulatory Visit: Payer: Self-pay | Admitting: Family Medicine

## 2012-08-17 NOTE — Telephone Encounter (Signed)
eScribe request for refill on ATENOLOL Last filled - 02/22/12, #90 X 1 Last seen on - 08/02/12 RX sent per protocol

## 2012-10-10 ENCOUNTER — Encounter: Payer: Self-pay | Admitting: Cardiology

## 2012-10-10 ENCOUNTER — Ambulatory Visit (INDEPENDENT_AMBULATORY_CARE_PROVIDER_SITE_OTHER): Payer: Managed Care, Other (non HMO) | Admitting: Cardiology

## 2012-10-10 VITALS — BP 119/76 | HR 68 | Ht 67.0 in | Wt 192.0 lb

## 2012-10-10 DIAGNOSIS — Z720 Tobacco use: Secondary | ICD-10-CM | POA: Insufficient documentation

## 2012-10-10 DIAGNOSIS — F17200 Nicotine dependence, unspecified, uncomplicated: Secondary | ICD-10-CM

## 2012-10-10 DIAGNOSIS — I2510 Atherosclerotic heart disease of native coronary artery without angina pectoris: Secondary | ICD-10-CM

## 2012-10-10 LAB — BASIC METABOLIC PANEL
BUN: 18 mg/dL (ref 6–23)
CO2: 24 mEq/L (ref 19–32)
Chloride: 105 mEq/L (ref 96–112)
Creatinine, Ser: 1.1 mg/dL (ref 0.4–1.5)
Glucose, Bld: 140 mg/dL — ABNORMAL HIGH (ref 70–99)

## 2012-10-10 LAB — LIPID PANEL
Cholesterol: 115 mg/dL (ref 0–200)
LDL Cholesterol: 66 mg/dL (ref 0–99)
Total CHOL/HDL Ratio: 4

## 2012-10-10 LAB — HEPATIC FUNCTION PANEL
Alkaline Phosphatase: 54 U/L (ref 39–117)
Bilirubin, Direct: 0.2 mg/dL (ref 0.0–0.3)
Total Bilirubin: 1 mg/dL (ref 0.3–1.2)

## 2012-10-10 NOTE — Assessment & Plan Note (Signed)
Blood pressure controlled. Continue present medications. Check potassium and renal function. 

## 2012-10-10 NOTE — Progress Notes (Signed)
HPI: [name] is a pleasant gentleman who has a history of coronary disease status post drug-eluting stent to the circumflex in August 2005. His last Myoview was performed in Feb 2012. The patient's ejection fraction was 55%. There was no sign of scar or ischemia. I last saw him in March of 2013. Since then the patient denies any dyspnea on exertion, orthopnea, PND, pedal edema, palpitations, syncope or chest pain.   Current Outpatient Prescriptions  Medication Sig Dispense Refill  . acetaminophen (TYLENOL) 500 MG tablet Take 1,000 mg by mouth as needed.      Marland Kitchen aspirin 81 MG tablet Take 81 mg by mouth daily.       Marland Kitchen atenolol (TENORMIN) 50 MG tablet Take 1 tablet (50 mg total) by mouth daily.  90 tablet  1  . fluticasone (FLONASE) 50 MCG/ACT nasal spray Place 2 sprays into the nose daily.  48 g  1  . glipiZIDE (GLUCOTROL XL) 2.5 MG 24 hr tablet Take 1 tablet (2.5 mg total) by mouth daily.  90 tablet  1  . lisinopril (PRINIVIL,ZESTRIL) 20 MG tablet Take 1 tablet (20 mg total) by mouth daily.  90 tablet  1  . metFORMIN (GLUCOPHAGE) 1000 MG tablet Take 1 tablet (1,000 mg total) by mouth 2 (two) times daily with a meal.  180 tablet  1  . phenazopyridine (PYRIDIUM) 200 MG tablet Take 200 mg by mouth every 8 (eight) hours as needed.      . simvastatin (ZOCOR) 40 MG tablet Take 1 tablet (40 mg total) by mouth every evening.  90 tablet  1  . Tamsulosin HCl (FLOMAX) 0.4 MG CAPS Take 1 capsule (0.4 mg total) by mouth daily.  90 capsule  1  . traMADol (ULTRAM) 50 MG tablet Take 50 mg by mouth every 6 (six) hours as needed.       No current facility-administered medications for this visit.     Past Medical History  Diagnosis Date  . History of bladder cancer 2012    Mass removed, plus gets ongoing BCG bladder infusions by urologist (Dr. Laverle Patter).  Marland Kitchen CAD (coronary artery disease)      '05 MI > DES to circumflex  . HTN (hypertension)   . Hyperlipidemia   . Diabetes mellitus type 2, controlled       HbA1c 02/2011 was 6.6%  . Wolf-Parkinson-White syndrome   . Tobacco abuse   . BPH (benign prostatic hyperplasia)     with hx of acute prostatitis  . Elevated PSA 09/2011    with prostate nodule.  Prostate bx ALL BENIGN 10/10/11 (Dr. Laverle Patter)    Past Surgical History  Procedure Laterality Date  . Bladder tumor excision  11/08/10    urothelial carcinoma (Dr. Laverle Patter).  . Myocardial perfusion study  2009    Neg ischemia, EF 52%  . Lexiscan  2012    Normal/negative  . Lumbar spine surgery  1989 & 1991    Neurosurgeon in [place] ([facility])-no trouble since last surgery  . Colonoscopy  2010    Dr. Gloriajean Dell, repeat 2020.  Patient wants to go to different GI when time for repeat.  . Vasectomy      History   Social History  . Marital Status: Married    Spouse Name: N/A    Number of Children: N/A  . Years of Education: N/A   Occupational History  . Not on file.   Social History Main Topics  . Smoking status: Former Smoker -- 30 years  Types: Cigarettes  . Smokeless tobacco: Never Used     Comment: 2-3 cigs a day  . Alcohol Use: No  . Drug Use: No  . Sexually Active: Not on file   Other Topics Concern  . Not on file   Social History Narrative   Married, 2 children, 4 grandchildren--[place].   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   Tob: 30 pack-yr hx, quit 08/2011.   No alcohol or drug use.   Active on farm but no formal exercise regimen.    ROS: no fevers or chills, productive cough, hemoptysis, dysphasia, odynophagia, melena, hematochezia, dysuria, hematuria, rash, seizure activity, orthopnea, PND, pedal edema, claudication. Remaining systems are negative.  Physical Exam: Well-developed well-nourished in no acute distress.  Skin is warm and dry.  HEENT is normal.  Neck is supple.  Chest is clear to auscultation with normal expansion.  Cardiovascular  exam is regular rate and rhythm.  Abdominal exam nontender or distended. No masses palpated. Extremities show no edema. neuro grossly intact  ECG normal sinus rhythm at a rate of 68. No ST changes.

## 2012-10-10 NOTE — Assessment & Plan Note (Signed)
Patient counseled on discontinuing. 

## 2012-10-10 NOTE — Patient Instructions (Addendum)
Your physician wants you to follow-up in: ONE YEAR WITH DR CRENSHAW You will receive a reminder letter in the mail two months in advance. If you don't receive a letter, please call our office to schedule the follow-up appointment.   Your physician recommends that you HAVE LAB WORK TODAY 

## 2012-10-10 NOTE — Assessment & Plan Note (Signed)
Continue statin. Check lipids and liver. 

## 2012-10-10 NOTE — Assessment & Plan Note (Signed)
Continue aspirin and statin. 

## 2012-11-21 ENCOUNTER — Telehealth: Payer: Self-pay | Admitting: *Deleted

## 2012-11-21 ENCOUNTER — Other Ambulatory Visit: Payer: Self-pay | Admitting: *Deleted

## 2012-11-21 MED ORDER — SIMVASTATIN 40 MG PO TABS: 40.0000 mg | ORAL_TABLET | Freq: Every evening | ORAL | Status: DC

## 2012-11-21 MED ORDER — GLIPIZIDE ER 2.5 MG PO TB24: 2.5000 mg | ORAL_TABLET | Freq: Every day | ORAL | Status: DC

## 2012-11-21 NOTE — Telephone Encounter (Signed)
Rx request to pharmacy/SLS  

## 2012-11-27 ENCOUNTER — Other Ambulatory Visit: Payer: Self-pay | Admitting: *Deleted

## 2012-11-27 MED ORDER — SIMVASTATIN 40 MG PO TABS: 40.0000 mg | ORAL_TABLET | Freq: Every evening | ORAL | Status: DC

## 2012-11-27 MED ORDER — GLIPIZIDE ER 2.5 MG PO TB24: 2.5000 mg | ORAL_TABLET | Freq: Every day | ORAL | Status: DC

## 2012-11-27 NOTE — Progress Notes (Signed)
Reorder to new pharmacy per fax request/SLS

## 2012-12-03 ENCOUNTER — Ambulatory Visit (INDEPENDENT_AMBULATORY_CARE_PROVIDER_SITE_OTHER): Payer: Managed Care, Other (non HMO) | Admitting: Family Medicine

## 2012-12-03 ENCOUNTER — Encounter: Payer: Self-pay | Admitting: Family Medicine

## 2012-12-03 VITALS — BP 125/84 | HR 66 | Temp 98.2°F | Resp 16 | Wt 190.5 lb

## 2012-12-03 DIAGNOSIS — IMO0001 Reserved for inherently not codable concepts without codable children: Secondary | ICD-10-CM

## 2012-12-03 DIAGNOSIS — J069 Acute upper respiratory infection, unspecified: Secondary | ICD-10-CM

## 2012-12-03 LAB — MICROALBUMIN / CREATININE URINE RATIO
Creatinine,U: 171.4 mg/dL
Microalb Creat Ratio: 4.6 mg/g (ref 0.0–30.0)
Microalb, Ur: 7.9 mg/dL — ABNORMAL HIGH (ref 0.0–1.9)

## 2012-12-03 NOTE — Progress Notes (Signed)
OFFICE NOTE  12/03/2012  CC:  Chief Complaint  Patient presents with  . Follow-up    4-mth. [DM; HTN; Hyperlipid]     HPI: Patient is a 57 y.o. Caucasian male who is here for 4 mo f/u DM 2, HTN, hyperlipidemia.   Says he's compliant with meds, fasting gluc avg 120-130, 2H PP 140 or less. No longer having nocturia.  Recent cardiology f/u went well, labs wnl, has to f/u again in 1 yr.  Had onset of stuffy nose, some PND, and slight cough last night.  Restarted flonase last night. No fever, no face pain, no ST.  Mild HA.  No malaise or body aches.  Pertinent PMH: 0-130 Past Medical History  Diagnosis Date  . History of bladder cancer 2012    Mass removed, plus gets ongoing BCG bladder infusions by urologist (Dr. Laverle Patter).  Marland Kitchen CAD (coronary artery disease)      '05 MI > DES to circumflex  . HTN (hypertension)   . Hyperlipidemia   . Diabetes mellitus type 2, controlled     HbA1c 02/2011 was 6.6%  . Wolf-Parkinson-White syndrome   . Tobacco abuse   . BPH (benign prostatic hyperplasia)     with hx of acute prostatitis  . Elevated PSA 09/2011    with prostate nodule.  Prostate bx ALL BENIGN 10/10/11 (Dr. Laverle Patter)   Past surgical, social, and family history reviewed and no changes noted since last office visit.  MEDS:  Outpatient Prescriptions Prior to Visit  Medication Sig Dispense Refill  . acetaminophen (TYLENOL) 500 MG tablet Take 1,000 mg by mouth as needed.      Marland Kitchen aspirin 81 MG tablet Take 81 mg by mouth daily.       Marland Kitchen atenolol (TENORMIN) 50 MG tablet Take 1 tablet (50 mg total) by mouth daily.  90 tablet  1  . fluticasone (FLONASE) 50 MCG/ACT nasal spray Place 2 sprays into the nose daily.  48 g  1  . glipiZIDE (GLUCOTROL XL) 2.5 MG 24 hr tablet Take 1 tablet (2.5 mg total) by mouth daily.  90 tablet  1  . lisinopril (PRINIVIL,ZESTRIL) 20 MG tablet Take 1 tablet (20 mg total) by mouth daily.  90 tablet  1  . metFORMIN (GLUCOPHAGE) 1000 MG tablet Take 1 tablet (1,000 mg  total) by mouth 2 (two) times daily with a meal.  180 tablet  1  . phenazopyridine (PYRIDIUM) 200 MG tablet Take 200 mg by mouth every 8 (eight) hours as needed.      . simvastatin (ZOCOR) 40 MG tablet Take 1 tablet (40 mg total) by mouth every evening.  90 tablet  1  . Tamsulosin HCl (FLOMAX) 0.4 MG CAPS Take 1 capsule (0.4 mg total) by mouth daily.  90 capsule  1  . traMADol (ULTRAM) 50 MG tablet Take 50 mg by mouth every 6 (six) hours as needed.       No facility-administered medications prior to visit.    PE: Blood pressure 125/84, pulse 66, temperature 98.2 F (36.8 C), temperature source Oral, resp. rate 16, weight 190 lb 8 oz (86.41 kg), SpO2 97.00%. generally ENT: Ears: EACs clear, normal epithelium.  TMs with good light reflex and landmarks bilaterally.  Eyes: no injection, icteris, swelling, or exudate.  EOMI, PERRLA. Nose: no drainage but there is mild diffuse mucosal edema.  No injection or focal lesion.  Mouth: lips without lesion/swelling.  Oral mucosa pink and moist.  Dentition in disrepair.  Oropharynx without erythema, exudate, or  swelling.  CV: RRR, no m/r/g.   LUNGS: CTA bilat, nonlabored resps, good aeration in all lung fields. EXT: no clubbing, cyanosis, or edema.   LABS:  Lab Results  Component Value Date   CHOL 115 10/10/2012   HDL 26.80* 10/10/2012   LDLCALC 66 10/10/2012   LDLDIRECT 87.2 09/03/2010   TRIG 109.0 10/10/2012   CHOLHDL 4 10/10/2012     Chemistry      Component Value Date/Time   NA 138 10/10/2012 0926   K 4.2 10/10/2012 0926   CL 105 10/10/2012 0926   CO2 24 10/10/2012 0926   BUN 18 10/10/2012 0926   CREATININE 1.1 10/10/2012 0926      Component Value Date/Time   CALCIUM 9.3 10/10/2012 0926   ALKPHOS 54 10/10/2012 0926   AST 24 10/10/2012 0926   ALT 29 10/10/2012 0926   BILITOT 1.0 10/10/2012 0926      IMPRESSION AND PLAN: 1) DM 2, decent control as per home monitoring.  Check HbA1c today.  Also check urine microalb/cr today, and at next f/u visit  I'll do his foot exam.  He is aware that he is overdue for diab retpthy screening exam and says "I'm working on it".  Has lots of "catch-up" health issues on his plate.  2) HTN.  STable.  Continue current atenolol and lisinopril.  3) Hyperlipidemia: continue statin.  Recheck lipids 09/2013.  4) Viral URI vs allergic rhinitis.   Symptomatic care discussed. Signs/symptoms to call or return for were reviewed and pt expressed understanding.  An After Visit Summary was printed and given to the patient.  FOLLOW UP: 4 mo

## 2012-12-04 ENCOUNTER — Telehealth: Payer: Self-pay | Admitting: *Deleted

## 2012-12-04 NOTE — Telephone Encounter (Signed)
Message copied by Regis Bill on Tue Dec 04, 2012  1:14 PM ------      Message from: Jeoffrey Massed      Created: Tue Dec 04, 2012  8:19 AM       Pls notify pt that I want him to increase his glucotrol XL to 5mg  qAM.  His HbA1c was 7.2%, which is close to his goal of <7%, but I think this slight increase in med dosing will be just right for him.      His urine testing was normal. -thx ------

## 2012-12-04 NOTE — Telephone Encounter (Signed)
LMOM with contact name and number for return call RE: and further provider instructions/SLS

## 2012-12-05 ENCOUNTER — Telehealth: Payer: Self-pay | Admitting: Family Medicine

## 2012-12-05 NOTE — Telephone Encounter (Signed)
New telephone note opened 05.14.14 by Blain Pais:  Results [in visit info] 'I spoke with patient and gave him the results of his labs. He would like the results and your instructions released to him in my chart." Provider noted results released.

## 2012-12-05 NOTE — Telephone Encounter (Signed)
OK.  Results released.

## 2012-12-20 ENCOUNTER — Other Ambulatory Visit: Payer: Self-pay | Admitting: *Deleted

## 2012-12-20 DIAGNOSIS — E119 Type 2 diabetes mellitus without complications: Secondary | ICD-10-CM

## 2012-12-20 MED ORDER — LISINOPRIL 20 MG PO TABS: 20.0000 mg | ORAL_TABLET | Freq: Every day | ORAL | Status: DC

## 2012-12-20 MED ORDER — METFORMIN HCL 1000 MG PO TABS: 1000.0000 mg | ORAL_TABLET | Freq: Two times a day (BID) | ORAL | Status: DC

## 2012-12-20 NOTE — Telephone Encounter (Signed)
Rx request to pharmacy/SLS  

## 2013-01-09 ENCOUNTER — Telehealth: Payer: Self-pay | Admitting: Family Medicine

## 2013-01-09 MED ORDER — GLIPIZIDE ER 10 MG PO TB24: 10.0000 mg | ORAL_TABLET | Freq: Every day | ORAL | Status: DC

## 2013-01-09 MED ORDER — GLIPIZIDE ER 5 MG PO TB24: 5.0000 mg | ORAL_TABLET | Freq: Every day | ORAL | Status: DC

## 2013-01-09 NOTE — Telephone Encounter (Signed)
New rx for glipizide XL 5mg  sent to pharmacy.

## 2013-01-09 NOTE — Telephone Encounter (Signed)
According to notes, you want pt to take 5mg  tabs but he only was prescribed 2.5mg  tabs.  Pharmacy requesting new prescription.  Please advise.  Thanks.

## 2013-01-31 ENCOUNTER — Other Ambulatory Visit: Payer: Self-pay

## 2013-02-15 ENCOUNTER — Telehealth: Payer: Self-pay | Admitting: Emergency Medicine

## 2013-02-15 MED ORDER — GLIPIZIDE ER 5 MG PO TB24: 5.0000 mg | ORAL_TABLET | Freq: Every day | ORAL | Status: DC

## 2013-02-15 NOTE — Telephone Encounter (Signed)
Patient needs 90 day supply refill sent to [facility] pharmacy.

## 2013-02-21 ENCOUNTER — Other Ambulatory Visit: Payer: Self-pay | Admitting: Family Medicine

## 2013-02-21 MED ORDER — TAMSULOSIN HCL 0.4 MG PO CAPS: 0.4000 mg | ORAL_CAPSULE | Freq: Every day | ORAL | Status: DC

## 2013-04-02 ENCOUNTER — Ambulatory Visit (INDEPENDENT_AMBULATORY_CARE_PROVIDER_SITE_OTHER): Payer: Managed Care, Other (non HMO) | Admitting: Family Medicine

## 2013-04-02 ENCOUNTER — Encounter: Payer: Self-pay | Admitting: Family Medicine

## 2013-04-02 VITALS — BP 121/77 | HR 60 | Temp 97.9°F | Resp 16 | Ht 66.5 in | Wt 188.0 lb

## 2013-04-02 DIAGNOSIS — I10 Essential (primary) hypertension: Secondary | ICD-10-CM

## 2013-04-02 DIAGNOSIS — E7800 Pure hypercholesterolemia, unspecified: Secondary | ICD-10-CM

## 2013-04-02 DIAGNOSIS — IMO0001 Reserved for inherently not codable concepts without codable children: Secondary | ICD-10-CM

## 2013-04-02 DIAGNOSIS — Z23 Encounter for immunization: Secondary | ICD-10-CM

## 2013-04-02 LAB — BASIC METABOLIC PANEL
BUN: 12 mg/dL (ref 6–23)
CO2: 27 mEq/L (ref 19–32)
Calcium: 9.5 mg/dL (ref 8.4–10.5)
Chloride: 105 mEq/L (ref 96–112)
Creatinine, Ser: 1 mg/dL (ref 0.4–1.5)
Glucose, Bld: 116 mg/dL — ABNORMAL HIGH (ref 70–99)

## 2013-04-02 LAB — HEMOGLOBIN A1C: Hgb A1c MFr Bld: 7 % — ABNORMAL HIGH (ref 4.6–6.5)

## 2013-04-02 NOTE — Progress Notes (Addendum)
OFFICE NOTE  04/02/2013  CC:  Chief Complaint  Patient presents with  . Diabetes    follow up     HPI: Patient is a 57 y.o. Caucasian male who is here for 4 mo f/u DM 2. Has been feeling real good but admits to being too busy to check glucoses or eat diabetic diet. 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  Denies any persistent burning, tingling, numbness or other feet complaints.  Pertinent PMH:  Past Medical History  Diagnosis Date  . History of bladder cancer 2012    Mass removed, plus gets ongoing BCG bladder infusions by urologist (Dr. Laverle Patter).  Marland Kitchen CAD (coronary artery disease)      '05 MI > DES to circumflex  . HTN (hypertension)   . Hyperlipidemia   . Diabetes mellitus type 2, controlled     HbA1c 02/2011 was 6.6%  . Wolf-Parkinson-White syndrome   . Tobacco abuse   . BPH (benign prostatic hyperplasia)     with hx of acute prostatitis  . Elevated PSA 09/2011    with prostate nodule.  Prostate bx ALL BENIGN 10/10/11 (Dr. Laverle Patter)   Past surgical, social, and family history reviewed and no changes noted since last office visit.  MEDS:  Outpatient Prescriptions Prior to Visit  Medication Sig Dispense Refill  . acetaminophen (TYLENOL) 500 MG tablet Take 1,000 mg by mouth as needed.      Marland Kitchen aspirin 81 MG tablet Take 81 mg by mouth daily.       Marland Kitchen atenolol (TENORMIN) 50 MG tablet Take 1 tablet (50 mg total) by mouth daily.  90 tablet  1  . glipiZIDE (GLUCOTROL XL) 5 MG 24 hr tablet Take 1 tablet (5 mg total) by mouth daily.  90 tablet  1  . lisinopril (PRINIVIL,ZESTRIL) 20 MG tablet Take 1 tablet (20 mg total) by mouth daily.  90 tablet  1  . metFORMIN (GLUCOPHAGE) 1000 MG tablet Take 1 tablet (1,000 mg total) by mouth 2 (two) times daily with a meal.  180 tablet  1  . simvastatin (ZOCOR) 40 MG tablet Take 1 tablet (40 mg total) by mouth every evening.  90 tablet  1  . tamsulosin (FLOMAX) 0.4 MG CAPS Take 1 capsule (0.4 mg total) by mouth daily.  90 capsule  0  .  fluticasone (FLONASE) 50 MCG/ACT nasal spray Place 2 sprays into the nose daily.  48 g  1  . phenazopyridine (PYRIDIUM) 200 MG tablet Take 200 mg by mouth every 8 (eight) hours as needed.      . traMADol (ULTRAM) 50 MG tablet Take 50 mg by mouth every 6 (six) hours as needed.       No facility-administered medications prior to visit.    PE: Blood pressure 121/77, pulse 60, temperature 97.9 F (36.6 C), temperature source Temporal, resp. rate 16, height 5' 6.5" (1.689 m), weight 188 lb (85.276 kg), SpO2 97.00%. Gen: Alert, well appearing.  Patient is oriented to person, place, time, and situation. AFFECT: pleasant, lucid thought and speech. Foot exam - bilateral normal; no swelling, tenderness or skin or vascular lesions. Color and temperature is normal. Sensation is intact. Peripheral pulses are palpable. Toenails are normal.   IMPRESSION AND PLAN:  Type II or unspecified type diabetes mellitus without mention of complication, uncontrolled Feeling well but noncompliant with diet and home gluc monitoring. Recheck HbA1c today. He is working on getting an eye exam-has been 5 yrs since last one. Feet: exam normal today.  HYPERTENSION, BENIGN Problem stable.  Continue current medications and diet appropriate for this condition.  We have reviewed our general long term plan for this problem and also reviewed symptoms and signs that should prompt the patient to call or return to the office. BMET today.  HYPERCHOLESTEROLEMIA, PURE Lab Results  Component Value Date   CHOL 115 10/10/2012   HDL 26.80* 10/10/2012   LDLCALC 66 10/10/2012   LDLDIRECT 87.2 09/03/2010   TRIG 109.0 10/10/2012   CHOLHDL 4 10/10/2012   Tolerating zocor, continue this.  Encouraged pt to try to follow a lower fat/lower chol/lower carb diet.   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   Flu vaccine IM today.  An After Visit Summary was printed and given to the  patient.  FOLLOW UP: 4 mo --CPE (fasting)

## 2013-04-02 NOTE — Assessment & Plan Note (Signed)
Problem stable.  Continue current medications and diet appropriate for this condition.  We have reviewed our general long term plan for this problem and also reviewed symptoms and signs that should prompt the patient to call or return to the office. BMET today.

## 2013-04-02 NOTE — Addendum Note (Signed)
Addended by: Jeoffrey Massed on: 04/02/2013 08:33 AM   Modules accepted: Orders

## 2013-04-02 NOTE — Assessment & Plan Note (Signed)
Lab Results  Component Value Date   CHOL 115 10/10/2012   HDL 26.80* 10/10/2012   LDLCALC 66 10/10/2012   LDLDIRECT 87.2 09/03/2010   TRIG 109.0 10/10/2012   CHOLHDL 4 10/10/2012   Tolerating zocor, continue this.  Encouraged pt to try to follow a lower fat/lower chol/lower carb diet.   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

## 2013-04-02 NOTE — Assessment & Plan Note (Signed)
Feeling well but noncompliant with diet and home gluc monitoring. Recheck HbA1c today. He is working on getting an eye exam-has been 5 yrs since last one. Feet: exam normal today.

## 2013-04-12 ENCOUNTER — Other Ambulatory Visit: Payer: Self-pay | Admitting: Family Medicine

## 2013-04-12 DIAGNOSIS — E119 Type 2 diabetes mellitus without complications: Secondary | ICD-10-CM

## 2013-04-12 MED ORDER — METFORMIN HCL 1000 MG PO TABS: 1000.0000 mg | ORAL_TABLET | Freq: Two times a day (BID) | ORAL | Status: DC

## 2013-05-30 ENCOUNTER — Other Ambulatory Visit: Payer: Self-pay | Admitting: Urology

## 2013-05-30 ENCOUNTER — Encounter (HOSPITAL_COMMUNITY): Payer: Self-pay | Admitting: Pharmacy Technician

## 2013-05-30 ENCOUNTER — Other Ambulatory Visit (HOSPITAL_COMMUNITY): Payer: Self-pay | Admitting: *Deleted

## 2013-05-31 ENCOUNTER — Ambulatory Visit (HOSPITAL_COMMUNITY)
Admission: RE | Admit: 2013-05-31 | Discharge: 2013-05-31 | Disposition: A | Discharge status: Home / Self Care | Payer: Managed Care, Other (non HMO) | Source: Ambulatory Visit | Attending: Urology | Admitting: Urology

## 2013-05-31 ENCOUNTER — Encounter (HOSPITAL_COMMUNITY)
Admission: RE | Admit: 2013-05-31 | Discharge: 2013-05-31 | Disposition: A | Discharge status: Home / Self Care | Payer: Managed Care, Other (non HMO) | Source: Ambulatory Visit | Attending: Urology | Admitting: Urology

## 2013-05-31 ENCOUNTER — Encounter (HOSPITAL_COMMUNITY): Payer: Self-pay

## 2013-05-31 DIAGNOSIS — D494 Neoplasm of unspecified behavior of bladder: Secondary | ICD-10-CM | POA: Insufficient documentation

## 2013-05-31 DIAGNOSIS — Z01812 Encounter for preprocedural laboratory examination: Secondary | ICD-10-CM | POA: Insufficient documentation

## 2013-05-31 DIAGNOSIS — Z01818 Encounter for other preprocedural examination: Secondary | ICD-10-CM | POA: Insufficient documentation

## 2013-05-31 HISTORY — DX: Headache: R51

## 2013-05-31 HISTORY — DX: Unspecified osteoarthritis, unspecified site: M19.90

## 2013-05-31 HISTORY — DX: Gastro-esophageal reflux disease without esophagitis: K21.9

## 2013-05-31 HISTORY — DX: Adverse effect of unspecified anesthetic, initial encounter: T41.45XA

## 2013-05-31 HISTORY — DX: Acute myocardial infarction, unspecified: I21.9

## 2013-05-31 LAB — BASIC METABOLIC PANEL
BUN: 14 mg/dL (ref 6–23)
Chloride: 102 mEq/L (ref 96–112)
Creatinine, Ser: 1.08 mg/dL (ref 0.50–1.35)
GFR calc Af Amer: 86 mL/min — ABNORMAL LOW (ref >90)
GFR calc non Af Amer: 74 mL/min — ABNORMAL LOW (ref >90)
Glucose, Bld: 210 mg/dL — ABNORMAL HIGH (ref 70–99)

## 2013-05-31 LAB — CBC
Hemoglobin: 16.3 g/dL (ref 13.0–17.0)
MCV: 87.4 fL (ref 78.0–100.0)
Platelets: 145 10*3/uL — ABNORMAL LOW (ref 150–400)
RDW: 13.3 % (ref 11.5–15.5)
WBC: 7.1 10*3/uL (ref 4.0–10.5)

## 2013-05-31 NOTE — Progress Notes (Signed)
EKG 10/10/12 on EPIC

## 2013-05-31 NOTE — Progress Notes (Signed)
05/31/13 1009  OBSTRUCTIVE SLEEP APNEA  Have you ever been diagnosed with sleep apnea through a sleep study? No  Do you snore loudly (loud enough to be heard through closed doors)?  1  Do you often feel tired, fatigued, or sleepy during the daytime? 0  Has anyone observed you stop breathing during your sleep? 0  Do you have, or are you being treated for high blood pressure? 1  BMI more than 35 kg/m2? 0  Age over 58 years old? 1  Neck circumference greater than 40 cm/18 inches? 0  Gender: 1  Obstructive Sleep Apnea Score 4  Score 4 or greater  Results sent to PCP

## 2013-05-31 NOTE — Patient Instructions (Addendum)
20 Mark Blankenship  05/31/2013   Your procedure is scheduled on: 06/06/13  Report to [facility] at 6:15 AM.  Call this number if you have problems the morning of surgery [phone]   Remember:   Do not eat food or drink liquids After Midnight.   Do not wear jewelry, make-up or nail polish.  Do not wear lotions, powders, or perfumes. You may wear deodorant.  Do not shave 48 hours prior to surgery. Men may shave face and neck.  Do not bring valuables to the hospital    Patients discharged the day of surgery will not be allowed to drive home.  Name and phone number of your driver: Zella Ball [phone]   Birdie Sons, RN  pre op nurse call if needed [phone]    FAILURE TO FOLLOW THESE INSTRUCTIONS MAY RESULT IN CANCELLATION OF YOUR SURGERY   Patient Signature: ___________________________________________

## 2013-06-05 NOTE — H&P (Signed)
History of Present Illness     Mark Blankenship is a 57 year old with the following urologic history:  1) BPH/LUTS: He presented in November 2010 with symptoms including dysuria, new unconscious incontinence, frequency, urgency, and nocturia. He was felt to most likely have acute prostatitis and was treated with ciprofloxicin and tamsulosin. His symptoms mostly resolved after antibiotic treatment except for mild nocturia and frequency improved on alpha blocker therapy. Current treatement: Tamsulosin 0.4 mg  2) Urothelial carcinoma of the bladder: He presented with voiding symptoms and microscopic hematuria in January 2012 and was found to have a bladder tumor on cystoscopy.  TURBT was performed in March 2012 (delayed due to acute chest pain and cardiac evaluation) revealed high grade T1 urothelial carcinoma with concomitant CIS of the bladder. Repeat staging TUR in April 2012 did not demonstrate further disease.  Mar 2012: TUR - High grade, T1 Apr 2012: Restaging TUR - no residual tumor May-June 2012: Induction BCG Sept-Oct 2012: Maintenance BCG Dec 2012-Jan 2013: Maintenance BCG Jun-Jul 2013: Maintenance BCG  3) Elevated PSA/ prostate nodule:  His PSA increased to 4.6 in March 2013 and he was found to have a new right mid prostate nodule prompting a prostate biopsy which was benign. Family history: Positive (father)  Mar 2013 (12 core): Benign, Vol 31 cc  Interval history:  He follows up today for further evaluation of his bladder cancer and history of an elevated PSA.  He has denied any recent gross hematuria.  His lower urinary tract symptoms remain stable on tamsulosin.     Past Medical History  Problems  1. History of Acute Myocardial Infarction (V12.59) 2. History of Arthritis (V13.4)     History of diabetes mellitus (250.00) (Z86.39)     History of hypercholesterolemia (272.0) (Z86.39)     History of hypertension (401.9) (Z86.79)   Surgical History Problems  1. History of  Back Surgery 2. History of Cystoscopy With Fulguration Large Lesion (Over 5cm) 3. History of Cystoscopy With Fulguration Small Lesion (5-71mm) 4. History of Surgery Of Male Genitalia Vasectomy  Current Meds 1. Adult Aspirin Low Strength 81 MG Oral Tablet Dispersible;  Therapy: (Recorded:12Nov2010) to Recorded 2. Atenolol TABS;  Therapy: (Recorded:12Nov2010) to Recorded 3. Fluticasone Propionate 50 MCG/ACT Nasal Suspension;  Therapy: 08May2012 to Recorded 4. GlipiZIDE XL 2.5 MG Oral Tablet Extended Release 24 Hour;  Therapy: 14May2012 to Recorded 5. Lisinopril TABS;  Therapy: (Recorded:12Nov2010) to Recorded 6. MetFORMIN HCl - 1000 MG Oral Tablet;  Therapy: (Recorded:05Jun2013) to Recorded 7. Pyridium 200 MG Oral Tablet; TAKE 1 TABLET Every 8 hours PRN;  Therapy: 28Mar2012 to (Last Rx:21Mar2014) Ordered 8. Simvastatin 10 MG Oral Tablet;  Therapy: (Recorded:28Mar2012) to Recorded 9. Tamsulosin HCl - 0.4 MG Oral Capsule; TAKE ONE CAPSULE BY MOUTH EVERY DAY;  Therapy: 12Nov2010 to (Evaluate:01May2014)  Requested for: 06May2013; Last  Rx:06May2013 Ordered  Allergies Medication  1. No Known Drug Allergies  Family History Problems  1. Family history of Diabetes Mellitus (V18.0) : Mother 2. Family history of Lung Cancer (V16.1) : Father 3. Family history of Prostate Cancer (B14.78) : Father  Social History Problems  1. Denied: History of Alcohol Use 2. Current every day smoker (305.1) 3. Marital History - Currently Married 4. Occupation:   farmer 5. Tobacco Use (V15.82)   1/2 ppdx 30 years  Review of Systems  Genitourinary: no hematuria.  Cardiovascular: no chest pain and no leg swelling.    Vitals Vital Signs [Data Includes: Last 1 Day]  Recorded: 05Nov2014 09:05AM  Blood  Pressure: 126 / 81 Temperature: 97.7 F Heart Rate: 67  Physical Exam Constitutional: Well nourished and well developed . No acute distress.  ENT:. The ears and nose are normal in appearance.   Neck: The appearance of the neck is normal and no neck mass is present.  Pulmonary: No respiratory distress, normal respiratory rhythm and effort and clear bilateral breath sounds.  Cardiovascular: Heart rate and rhythm are normal . No peripheral edema.  Abdomen: The abdomen is soft and nontender. No masses are palpated. No CVA tenderness. No hernias are palpable. No hepatosplenomegaly noted.  Rectal: Prostate size is estimated to be 45 g. The prostate has no nodularity and is not indurated.  Genitourinary: Examination of the penis demonstrates no lesions and a normal meatus.  Skin: Normal skin turgor, no visible rash and no visible skin lesions.  Neuro/Psych:. Mood and affect are appropriate.    Results/Data Urine [Data Includes: Last 1 Day]   05Nov2014  COLOR YELLOW   APPEARANCE CLEAR   SPECIFIC GRAVITY 1.020   pH 5.5   GLUCOSE 250 mg/dL  BILIRUBIN NEG   KETONE NEG mg/dL  BLOOD NEG   PROTEIN NEG mg/dL  UROBILINOGEN 0.2 mg/dL  NITRITE NEG   LEUKOCYTE ESTERASE NEG   Selected Results  PSA REFLEX TO FREE 29Oct2014 09:15AM Mark Blankenship  SPECIMEN TYPE: BLOOD   Test Name Result Flag Reference  PSA 3.07 ng/mL  <=4.00  RESULT REPEATED AND VERIFIED. TEST METHODOLOGY: ECLIA PSA (ELECTROCHEMILUMINESCENCE IMMUNOASSAY)   PSA Flowsheet [ZOXW Includes: All]   29Oct2014 23Jun2014 16Apr2014 02Oct2013 01Mar2013 10Jan2012 07Jan2011  PSA 3.07 ng/mL 4.72 ng/mL 5.69 ng/mL 3.91 ng/mL 4.60 ng/mL 0.71 ng/mL 0.84 ng/mL  Free PSA  0.58 ng/mL 0.71 ng/mL  0.68 ng/mL    % Free PSA  12 % 12 %  15 %      PVR: 76 cc  IPSS 7.  Procedure  Procedure: Cystoscopy  Chaperone Present: Herbert Seta S.  Indication: History of Urothelial Carcinoma.  Informed Consent: Risks, benefits, and potential adverse events were discussed and informed consent was obtained from the patient.  Prep: The patient was prepped with betadine.  Anesthesia:. Local anesthesia was administered intraurethrally with 2% lidocaine jelly.   Antibiotic prophylaxis: Ciprofloxacin.  Procedure Note:  Urethral meatus:. No abnormalities.  Anterior urethra: No abnormalities.  Prostatic urethra:. The lateral prostatic lobes were enlarged.  Bladder: Visulization was clear. The ureteral orifices were in the normal anatomic position bilaterally and had clear efflux of urine. Systematic examination of the bladder revealed the ureteral orifices to be in their expected anatomic location. No obvious bladder tumors were identified. However, the patient did have 2 suspicious areas for the posterior aspect of the bladder in the midline. These areas were both raised from the mucosa and were whitish in appearance. They did not appear to represent obvious malignant tumors although this could not be completely ruled out. The largest of these areas measured approximately 2.5 centimeters. A saline bladder washing was obtained and sent for cytologic analysis. The patient tolerated the procedure well.  Complications: None.    Assessment Assessed  1. Bladder Cancer 188.9 2. Benign Localized Prostatic Hyperplasia Without Urinary Obstruction With Other Lower  Urinary Tract Symptoms 600.20 3. PSA,Elevated 790.93  Discussion/Summary  1.  Bladder cancer: We discussed his cystoscopic findings today are not terribly suspicious raised enough concern that I have recommended he proceed with a biopsy and resection of these areas especially considering his high-risk disease.  He will be scheduled for cystoscopy, bladder biopsy, possible  transurethral resection of bladder tumors, and bilateral retrograde pyelography.  The potential risks, complications, and alternative options associated with this procedure have been reviewed in detail.  Informed consent was obtained.  2.  Elevated PSA: We reviewed his PSA which actually had decreased.  This will continue to be monitored.  3.  BPH/LUTS: He will continue current therapy with tamsulosin.  CC: [place], [facility]      Verified Results URINE CYTOLOGY W/REFLEX FISH1 05Nov2014 09:43AM1 Lyda Perone  SPECIMEN TYPE: OTHER   Test Name Result Flag Reference  FINAL DIAGNOSIS:1     - NO MALIGNANT CELLS IDENTIFIED. - BENIGN REACTIVE UROTHELIAL CELLS ARE PRESENT.  NUMBER OF SLIDES1     1 Container Submitted  SOURCE:1 Bladder Washing1    50CC IN CUP1, 60CC IN CUP 2 OF LIGHT YELLOW BLW RECEIVED 1 SLIDE PREPARED [id number] LW  Relevant Clinical Info1 BLADDER CANCER1    CYTOTECHNOLOGIST:1     EW, BA CT(ASCP)  PATHOLOGIST:1     REVIEWED BY VALERIE J. FIELDS, MD, FCAP (ELECTRONIC SIGNATURE ON FILE)     1. Amended By: Mark Blankenship; May 30 2013 7:27 PM EST  Signatures Electronically signed by : Mark Blankenship, M.D.; May 30 2013  7:27PM EST

## 2013-06-06 ENCOUNTER — Ambulatory Visit (HOSPITAL_COMMUNITY): Payer: Managed Care, Other (non HMO) | Admitting: Anesthesiology

## 2013-06-06 ENCOUNTER — Encounter (HOSPITAL_COMMUNITY): Payer: Self-pay | Admitting: *Deleted

## 2013-06-06 ENCOUNTER — Encounter (HOSPITAL_COMMUNITY)
Admission: RE | Disposition: A | Discharge status: Home / Self Care | Payer: Self-pay | Source: Ambulatory Visit | Attending: Urology

## 2013-06-06 ENCOUNTER — Ambulatory Visit (HOSPITAL_COMMUNITY)
Admission: RE | Admit: 2013-06-06 | Discharge: 2013-06-06 | Disposition: A | Discharge status: Home / Self Care | Payer: Managed Care, Other (non HMO) | Source: Ambulatory Visit | Attending: Urology | Admitting: Urology

## 2013-06-06 ENCOUNTER — Encounter (HOSPITAL_COMMUNITY): Payer: Managed Care, Other (non HMO) | Admitting: Anesthesiology

## 2013-06-06 DIAGNOSIS — L851 Acquired keratosis [keratoderma] palmaris et plantaris: Secondary | ICD-10-CM | POA: Insufficient documentation

## 2013-06-06 DIAGNOSIS — N402 Nodular prostate without lower urinary tract symptoms: Secondary | ICD-10-CM | POA: Insufficient documentation

## 2013-06-06 DIAGNOSIS — N3020 Other chronic cystitis without hematuria: Secondary | ICD-10-CM | POA: Insufficient documentation

## 2013-06-06 DIAGNOSIS — N400 Enlarged prostate without lower urinary tract symptoms: Secondary | ICD-10-CM | POA: Insufficient documentation

## 2013-06-06 DIAGNOSIS — Z7982 Long term (current) use of aspirin: Secondary | ICD-10-CM | POA: Insufficient documentation

## 2013-06-06 DIAGNOSIS — Z79899 Other long term (current) drug therapy: Secondary | ICD-10-CM | POA: Insufficient documentation

## 2013-06-06 DIAGNOSIS — E7800 Pure hypercholesterolemia, unspecified: Secondary | ICD-10-CM | POA: Insufficient documentation

## 2013-06-06 DIAGNOSIS — E119 Type 2 diabetes mellitus without complications: Secondary | ICD-10-CM | POA: Insufficient documentation

## 2013-06-06 DIAGNOSIS — R972 Elevated prostate specific antigen [PSA]: Secondary | ICD-10-CM | POA: Insufficient documentation

## 2013-06-06 DIAGNOSIS — N3289 Other specified disorders of bladder: Secondary | ICD-10-CM | POA: Insufficient documentation

## 2013-06-06 DIAGNOSIS — C679 Malignant neoplasm of bladder, unspecified: Secondary | ICD-10-CM | POA: Insufficient documentation

## 2013-06-06 DIAGNOSIS — K219 Gastro-esophageal reflux disease without esophagitis: Secondary | ICD-10-CM | POA: Insufficient documentation

## 2013-06-06 DIAGNOSIS — I252 Old myocardial infarction: Secondary | ICD-10-CM | POA: Insufficient documentation

## 2013-06-06 DIAGNOSIS — I10 Essential (primary) hypertension: Secondary | ICD-10-CM | POA: Insufficient documentation

## 2013-06-06 DIAGNOSIS — Z8042 Family history of malignant neoplasm of prostate: Secondary | ICD-10-CM | POA: Insufficient documentation

## 2013-06-06 HISTORY — PX: CYSTOSCOPY WITH RETROGRADE PYELOGRAM, URETEROSCOPY AND STENT PLACEMENT: SHX5789

## 2013-06-06 HISTORY — PX: TRANSURETHRAL RESECTION OF BLADDER TUMOR: SHX2575

## 2013-06-06 LAB — GLUCOSE, CAPILLARY
Glucose-Capillary: 145 mg/dL — ABNORMAL HIGH (ref 70–99)
Glucose-Capillary: 156 mg/dL — ABNORMAL HIGH (ref 70–99)
Glucose-Capillary: 156 mg/dL — ABNORMAL HIGH (ref 70–99)

## 2013-06-06 SURGERY — TURBT (TRANSURETHRAL RESECTION OF BLADDER TUMOR)
Anesthesia: General | Wound class: Clean Contaminated

## 2013-06-06 MED ORDER — 0.9 % SODIUM CHLORIDE (POUR BTL) OPTIME
TOPICAL | Status: DC | PRN
  Administered 2013-06-06: 1000 mL

## 2013-06-06 MED ORDER — PROMETHAZINE HCL 25 MG/ML IJ SOLN: 6.2500 mg | INTRAMUSCULAR | Status: DC | PRN

## 2013-06-06 MED ORDER — CIPROFLOXACIN IN D5W 400 MG/200ML IV SOLN
400.0000 mg | INTRAVENOUS | Status: AC
  Administered 2013-06-06: 400 mg via INTRAVENOUS

## 2013-06-06 MED ORDER — FENTANYL CITRATE 0.05 MG/ML IJ SOLN: 25.0000 ug | INTRAMUSCULAR | Status: DC | PRN

## 2013-06-06 MED ORDER — LIDOCAINE HCL 1 % IJ SOLN
INTRAMUSCULAR | Status: DC | PRN
  Administered 2013-06-06: 60 mg via INTRADERMAL

## 2013-06-06 MED ORDER — HYDROCODONE-ACETAMINOPHEN 5-325 MG PO TABS: 1.0000 | ORAL_TABLET | Freq: Four times a day (QID) | ORAL | Status: DC | PRN

## 2013-06-06 MED ORDER — PROPOFOL 10 MG/ML IV BOLUS
INTRAVENOUS | Status: DC | PRN
  Administered 2013-06-06: 200 mg via INTRAVENOUS

## 2013-06-06 MED ORDER — LIDOCAINE HCL 2 % EX GEL
CUTANEOUS | Status: AC
  Filled 2013-06-06: qty 10

## 2013-06-06 MED ORDER — ONDANSETRON HCL 4 MG/2ML IJ SOLN
INTRAMUSCULAR | Status: DC | PRN
  Administered 2013-06-06: 4 mg via INTRAVENOUS

## 2013-06-06 MED ORDER — IOHEXOL 300 MG/ML  SOLN
INTRAMUSCULAR | Status: DC | PRN
  Administered 2013-06-06: 10 mL

## 2013-06-06 MED ORDER — FENTANYL CITRATE 0.05 MG/ML IJ SOLN
INTRAMUSCULAR | Status: DC | PRN
  Administered 2013-06-06: 25 ug via INTRAVENOUS
  Administered 2013-06-06: 50 ug via INTRAVENOUS
  Administered 2013-06-06 (×2): 25 ug via INTRAVENOUS

## 2013-06-06 MED ORDER — METHYLENE BLUE 1 % INJ SOLN
INTRAMUSCULAR | Status: AC
  Filled 2013-06-06: qty 10

## 2013-06-06 MED ORDER — CIPROFLOXACIN HCL 250 MG PO TABS: 250.0000 mg | ORAL_TABLET | Freq: Two times a day (BID) | ORAL | Status: DC

## 2013-06-06 MED ORDER — SODIUM CHLORIDE 0.9 % IR SOLN
Status: DC | PRN
  Administered 2013-06-06: 4000 mL

## 2013-06-06 MED ORDER — CIPROFLOXACIN IN D5W 400 MG/200ML IV SOLN
INTRAVENOUS | Status: AC
  Filled 2013-06-06: qty 200

## 2013-06-06 MED ORDER — LACTATED RINGERS IV SOLN
INTRAVENOUS | Status: DC
  Administered 2013-06-06: 1000 mL via INTRAVENOUS

## 2013-06-06 SURGICAL SUPPLY — 21 items
BAG URINE DRAINAGE (UROLOGICAL SUPPLIES) IMPLANT
BAG URO CATCHER STRL LF (DRAPE) ×2 IMPLANT
BASKET ZERO TIP NITINOL 2.4FR (BASKET) IMPLANT
BSKT STON RTRVL ZERO TP 2.4FR (BASKET)
CATH INTERMIT  6FR 70CM (CATHETERS) ×2 IMPLANT
CLOTH BEACON ORANGE TIMEOUT ST (SAFETY) ×2 IMPLANT
DRAPE CAMERA CLOSED 9X96 (DRAPES) ×2 IMPLANT
ELECT LOOP MED HF 24F 12D CBL (CLIP) ×2 IMPLANT
ELECT REM PT RETURN 9FT ADLT (ELECTROSURGICAL)
ELECTRODE REM PT RTRN 9FT ADLT (ELECTROSURGICAL) IMPLANT
EVACUATOR MICROVAS BLADDER (UROLOGICAL SUPPLIES) IMPLANT
GLOVE BIOGEL M STRL SZ7.5 (GLOVE) ×2 IMPLANT
GOWN PREVENTION PLUS LG XLONG (DISPOSABLE) ×4 IMPLANT
GUIDEWIRE ANG ZIPWIRE 038X150 (WIRE) IMPLANT
GUIDEWIRE STR DUAL SENSOR (WIRE) IMPLANT
KIT ASPIRATION TUBING (SET/KITS/TRAYS/PACK) IMPLANT
LOOPS RESECTOSCOPE DISP (ELECTROSURGICAL) IMPLANT
MANIFOLD NEPTUNE II (INSTRUMENTS) ×2 IMPLANT
PACK CYSTO (CUSTOM PROCEDURE TRAY) ×2 IMPLANT
SYRINGE IRR TOOMEY STRL 70CC (SYRINGE) IMPLANT
TUBING CONNECTING 10 (TUBING) ×2 IMPLANT

## 2013-06-06 NOTE — Anesthesia Preprocedure Evaluation (Addendum)
Anesthesia Evaluation  Patient identified by MRN, date of birth, ID band Patient awake    Reviewed: Allergy & Precautions, H&P , NPO status , Patient's Chart, lab work & pertinent test results  Airway Mallampati: II TM Distance: >3 FB Neck ROM: Full    Dental  (+) Dental Advisory Given and Poor Dentition Very poor dentition. All upper teeth are broken at the gumline, except one sliver of a tooth.  He was told this tooth might break today.:   Pulmonary former smoker,  breath sounds clear to auscultation  Pulmonary exam normal       Cardiovascular hypertension, Pt. on medications and Pt. on home beta blockers + CAD and + Past MI + dysrhythmias Rhythm:Regular Rate:Normal     Neuro/Psych  Headaches, negative psych ROS   GI/Hepatic Neg liver ROS, GERD-  ,  Endo/Other  diabetes, Type 2, Oral Hypoglycemic Agents  Renal/GU negative Renal ROS  negative genitourinary   Musculoskeletal negative musculoskeletal ROS (+)   Abdominal   Peds negative pediatric ROS (+)  Hematology negative hematology ROS (+)   Anesthesia Other Findings   Reproductive/Obstetrics negative OB ROS                         Anesthesia Physical Anesthesia Plan  ASA: III  Anesthesia Plan: General   Post-op Pain Management:    Induction: Intravenous  Airway Management Planned: LMA  Additional Equipment:   Intra-op Plan:   Post-operative Plan: Extubation in OR  Informed Consent: I have reviewed the patients History and Physical, chart, labs and discussed the procedure including the risks, benefits and alternatives for the proposed anesthesia with the patient or authorized representative who has indicated his/her understanding and acceptance.   Dental advisory given  Plan Discussed with: CRNA  Anesthesia Plan Comments:         Anesthesia Quick Evaluation

## 2013-06-06 NOTE — Preoperative (Signed)
Beta Blockers   Reason not to administer Beta Blockers:Not Applicable 

## 2013-06-06 NOTE — Op Note (Signed)
Preoperative diagnosis: 1. Bladder tumor (2.5 cm)  Postoperative diagnosis:  1. Bladder tumor (2.5 cm)  Procedure:  1. Cystoscopy 2. Transurethral resection of bladder tumor (2.5 cm) 3. Bilateral retrograde pyelography with interpretation  Surgeon: Moody Bruins. M.D.  Anesthesia: General  Complications: None  Intraoperative findings:  1. Bladder tumor: He was noted to have a 2.5 cm necrotic, whitish appearing mass on the posterior aspect of the bladder which was raised. 2. Retrograde pyelography: Bilateral retrograde pyelography demonstrated normal caliber ureters without filling defects and without filling defects of the renal pelvis.  EBL: Minimal  Specimens: 1. Bladder tumor  Disposition of specimens: Pathology  Indication: Mark Blankenship is a patient who was found to have a bladder tumor and a history of high risk, non-muscle invasive bladder cancer. After reviewing the management options for treatment, he elected to proceed with the above surgical procedure(s). We have discussed the potential benefits and risks of the procedure, side effects of the proposed treatment, the likelihood of the patient achieving the goals of the procedure, and any potential problems that might occur during the procedure or recuperation. Informed consent has been obtained.  Description of procedure:  The patient was taken to the operating room and general anesthesia was induced.  The patient was placed in the dorsal lithotomy position, prepped and draped in the usual sterile fashion, and preoperative antibiotics were administered. A preoperative time-out was performed.   Cystourethroscopy was performed.  The patient's urethra was examined and was normal.   The bladder was then systematically examined in its entirety. There was a raised whitish appearing 2.5 cm lesion along the posterior aspect of the bladder without other abnormalities noted.  Attention then turned to the right  ureteral orifice and a ureteral catheter was used to intubate the ureteral orifice.  Omnipaque contrast was injected through the ureteral catheter and a retrograde pyelogram was performed with findings as dictated above.  Attention then turned to the left ureteral orifice and a ureteral catheter was used to intubate the ureteral orifice.  Omnipaque contrast was injected through the ureteral catheter and a retrograde pyelogram was performed with findings as dictated above.  The bladder was then re-examined after the resectoscope was placed.  The bladder tumor was 2.5 cm.  It was located posteriorly and appeared whitish and raised without evidence of obvious malignant features. Using bipolar loop cautery resection, the entire tumor was resected and removed for permanent pathologic analysis.   Hemostasis was then achieved with the loop cautery and the bladder was emptied and reinspected with no further bleeding noted at the end of the procedure.    The bladder was then emptied and the procedure ended.  The patient appeared to tolerate the procedure well and without complications.  The patient was able to be awakened and transferred to the recovery unit in satisfactory condition.    Moody Bruins MD

## 2013-06-06 NOTE — Transfer of Care (Signed)
Immediate Anesthesia Transfer of Care Note  Patient: Mark Blankenship  Procedure(s) Performed: Procedure(s): TRANSURETHRAL RESECTION OF BLADDER TUMOR (TURBT) (N/A) CYSTOSCOPY WITH RETROGRADE PYELOGRAM (N/A)  Patient Location: PACU  Anesthesia Type:General  Level of Consciousness: awake, alert  and oriented  Airway & Oxygen Therapy: Patient Spontanous Breathing and Patient connected to face mask oxygen  Post-op Assessment: Report given to PACU RN, Post -op Vital signs reviewed and stable and Patient moving all extremities X 4  Post vital signs: Reviewed and stable  Complications: No apparent anesthesia complications

## 2013-06-06 NOTE — Interval H&P Note (Signed)
History and Physical Interval Note:  06/06/2013 8:18 AM  Mark Blankenship  has presented today for surgery, with the diagnosis of Bladder Tumor  The various methods of treatment have been discussed with the patient and family. After consideration of risks, benefits and other options for treatment, the patient has consented to  Procedure(s): TRANSURETHRAL RESECTION OF BLADDER TUMOR (TURBT) (N/A) CYSTOSCOPY WITH RETROGRADE PYELOGRAM, BLADDER BIOPSY (N/A) as a surgical intervention .  The patient's history has been reviewed, patient examined, no change in status, stable for surgery.  I have reviewed the patient's chart and labs.  Questions were answered to the patient's satisfaction.     Treshaun Carrico,LES

## 2013-06-06 NOTE — Anesthesia Postprocedure Evaluation (Signed)
  Anesthesia Post-op Note  Patient: Mark Blankenship  Procedure(s) Performed: Procedure(s) (LRB): TRANSURETHRAL RESECTION OF BLADDER TUMOR (TURBT) (N/A) CYSTOSCOPY WITH RETROGRADE PYELOGRAM (N/A)  Patient Location: PACU  Anesthesia Type: General  Level of Consciousness: awake and alert   Airway and Oxygen Therapy: Patient Spontanous Breathing  Post-op Pain: mild  Post-op Assessment: Post-op Vital signs reviewed, Patient's Cardiovascular Status Stable, Respiratory Function Stable, Patent Airway and No signs of Nausea or vomiting  Last Vitals:  Filed Vitals:   06/06/13 1041  BP: 104/75  Pulse: 59  Temp: 36.3 C  Resp: 16    Post-op Vital Signs: stable   Complications: No apparent anesthesia complications

## 2013-06-07 ENCOUNTER — Encounter (HOSPITAL_COMMUNITY): Payer: Self-pay | Admitting: Urology

## 2013-06-11 ENCOUNTER — Encounter: Payer: Self-pay | Admitting: Family Medicine

## 2013-06-25 ENCOUNTER — Other Ambulatory Visit: Payer: Self-pay | Admitting: Family Medicine

## 2013-06-25 MED ORDER — LISINOPRIL 20 MG PO TABS: 20.0000 mg | ORAL_TABLET | Freq: Every day | ORAL | Status: DC

## 2013-07-26 ENCOUNTER — Other Ambulatory Visit (INDEPENDENT_AMBULATORY_CARE_PROVIDER_SITE_OTHER): Payer: Managed Care, Other (non HMO)

## 2013-07-26 DIAGNOSIS — Z0000 Encounter for general adult medical examination without abnormal findings: Secondary | ICD-10-CM

## 2013-07-26 LAB — LIPID PANEL
CHOLESTEROL: 136 mg/dL (ref 0–200)
HDL: 30 mg/dL — ABNORMAL LOW (ref >39.00)
LDL Cholesterol: 70 mg/dL (ref 0–99)
Total CHOL/HDL Ratio: 5
Triglycerides: 178 mg/dL — ABNORMAL HIGH (ref 0.0–149.0)
VLDL: 35.6 mg/dL (ref 0.0–40.0)

## 2013-07-26 LAB — HEMOGLOBIN A1C: Hgb A1c MFr Bld: 6.9 % — ABNORMAL HIGH (ref 4.6–6.5)

## 2013-07-29 ENCOUNTER — Telehealth: Payer: Self-pay | Admitting: Cardiology

## 2013-07-29 NOTE — Telephone Encounter (Signed)
New Problem:  Pt's daughter, Shirlean Mylar, states her dad had blood work at his [facility] on 07/26/13 and she wants to know if that blood work is good enough for his April 2015 annual appt with Dr. Stanford Breed. Or does her father need more labs.

## 2013-07-29 NOTE — Telephone Encounter (Signed)
Spoke with pt dtr, the pt had cholesterol checked and hga1c. Aware he will not need labs prior to appt.

## 2013-08-01 ENCOUNTER — Encounter: Payer: Self-pay | Admitting: Family Medicine

## 2013-08-01 ENCOUNTER — Ambulatory Visit (INDEPENDENT_AMBULATORY_CARE_PROVIDER_SITE_OTHER): Payer: Managed Care, Other (non HMO) | Admitting: Family Medicine

## 2013-08-01 VITALS — BP 120/77 | HR 62 | Temp 97.1°F | Resp 18 | Ht 66.5 in | Wt 194.0 lb

## 2013-08-01 DIAGNOSIS — R972 Elevated prostate specific antigen [PSA]: Secondary | ICD-10-CM

## 2013-08-01 DIAGNOSIS — Z0000 Encounter for general adult medical examination without abnormal findings: Secondary | ICD-10-CM

## 2013-08-01 DIAGNOSIS — E119 Type 2 diabetes mellitus without complications: Secondary | ICD-10-CM

## 2013-08-01 NOTE — Patient Instructions (Signed)
Get OTC ear wax dissolving drops (Debrox) and use daily for several weeks then return and let me take a look.

## 2013-08-01 NOTE — Assessment & Plan Note (Signed)
Recent A1c 6.9%. UTD on DM monitoring.

## 2013-08-01 NOTE — Assessment & Plan Note (Signed)
Most recent PSA 05/2013 normal per pt (via urologist).

## 2013-08-01 NOTE — Assessment & Plan Note (Addendum)
Reviewed age and gender appropriate health maintenance issues (prudent diet, regular exercise, health risks of tobacco and excessive alcohol, use of seatbelts, fire alarms in home, use of sunscreen).  Also reviewed age and gender appropriate health screening as well as vaccine recommendations. Prostate cancer and cancer screening UTD. He has a deep, complete left EAC cerumen impaction.  I gave the option of ENT referral but he declined today, opting instead for daily OTC debrox drops and f/u in office in a few weeks for recheck.

## 2013-08-01 NOTE — Progress Notes (Signed)
Pre visit review using our clinic review tool, if applicable. No additional management support is needed unless otherwise documented below in the visit note. 

## 2013-08-01 NOTE — Progress Notes (Signed)
Office Note 08/01/2013  CC:  Chief Complaint  Patient presents with  . Annual Exam    HPI:  Mark Blankenship is a 58 y.o. White male who is here for CPE. Feeling well except some intermittent left ear ache lately.  No fever.  No signif URI sx's or cough. No ear drainage.   Past Medical History  Diagnosis Date  . History of bladder cancer 2012    Mass removed, plus gets ongoing BCG bladder infusions by urologist (Dr. Alinda Money).  Marland Kitchen CAD (coronary artery disease)      '05 MI > DES to circumflex  . HTN (hypertension)   . Hyperlipidemia   . Diabetes mellitus type 2, controlled     HbA1c 02/2011 was 6.6%  . Wolf-Parkinson-White syndrome   . Tobacco abuse   . BPH (benign prostatic hyperplasia)     with hx of acute prostatitis  . Elevated PSA 09/2011    with prostate nodule.  Prostate bx ALL BENIGN 10/10/11 (Dr. Alinda Money)  . Myocardial infarction 2005  . GERD (gastroesophageal reflux disease)     occasional  . Headache(784.0)     cluster headaches a long time ago  . Arthritis   . Complication of anesthesia     "tried to fight when waking up"    Past Surgical History  Procedure Laterality Date  . Bladder tumor excision  11/08/10    urothelial carcinoma (Dr. Alinda Money).  . Myocardial perfusion study  2009    Neg ischemia, EF 52%  . Lexiscan  2012    Normal/negative  . Lumbar spine surgery  [facility] in [place] ([facility])-no trouble since last surgery  . Colonoscopy  2010    Dr. Isa Rankin, repeat 2020.  Patient wants to go to different GI when time for repeat.  . Vasectomy    . Cardiac catheterization  2005    with stent placement  . Transurethral resection of bladder tumor N/A 06/06/2013    Procedure: TRANSURETHRAL RESECTION OF BLADDER TUMOR (TURBT);  Surgeon: Dutch Gray, MD;  Location: [facility];  Service: Urology;  Laterality: N/A;.  PATH: squamous metaplasia, fibrosis with chronic inflammation, no malignancy.  . Cystoscopy with retrograde pyelogram,  ureteroscopy and stent placement N/A 06/06/2013    Procedure: CYSTOSCOPY WITH RETROGRADE PYELOGRAM;  Surgeon: Dutch Gray, MD;  Location: [facility];  Service: Urology;  Laterality: N/A;    Family History  Problem Relation Age of Onset  . Hypertension Mother   . Cancer Father     colon and prostate, mets to lungs.  . Hypertension Father   . Heart disease Brother     d. 101 MI  . Multiple sclerosis Brother   . Diabetes Brother   . Heart disease Paternal Uncle     History   Social History  . Marital Status: Married    Spouse Name: N/A    Number of Children: N/A  . Years of Education: N/A   Occupational History  . Not on file.   Social History Main Topics  . Smoking status: Former Smoker -- 30 years    Types: Cigarettes    Quit date: 07/26/2011  . Smokeless tobacco: Never Used  . Alcohol Use: No  . Drug Use: No  . Sexual Activity: Not on file   Other Topics Concern  . Not on file   Social History Narrative   Married, 2 children, 4 grandchildren--[place].   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   Tob: 30 pack-yr hx, quit 08/2011.   No alcohol or drug use.   Active on farm but no formal exercise regimen.    Outpatient Prescriptions Prior to Visit  Medication Sig Dispense Refill  . acetaminophen (TYLENOL) 500 MG tablet Take 1,000 mg by mouth every 6 (six) hours as needed for mild pain or headache.       Marland Kitchen aspirin 81 MG tablet Take 81 mg by mouth daily.       Marland Kitchen atenolol (TENORMIN) 50 MG tablet Take 50 mg by mouth daily with lunch.      . fluticasone (FLONASE) 50 MCG/ACT nasal spray Place 1 spray into both nostrils daily.      Marland Kitchen glipiZIDE (GLUCOTROL XL) 5 MG 24 hr tablet Take 5 mg by mouth daily with breakfast.      . HYDROcodone-acetaminophen (NORCO/VICODIN) 5-325 MG per tablet Take 1-2 tablets by mouth every 6 (six) hours as needed.  25 tablet  0  . lisinopril  (PRINIVIL,ZESTRIL) 20 MG tablet Take 1 tablet (20 mg total) by mouth daily with lunch.  90 tablet  0  . metFORMIN (GLUCOPHAGE) 1000 MG tablet Take 1,000 mg by mouth 2 (two) times daily with a meal.      . simvastatin (ZOCOR) 40 MG tablet Take 40 mg by mouth every evening.      . tamsulosin (FLOMAX) 0.4 MG CAPS capsule Take 0.4 mg by mouth daily after supper.      . ciprofloxacin (CIPRO) 250 MG tablet Take 1 tablet (250 mg total) by mouth 2 (two) times daily.  4 tablet  0   No facility-administered medications prior to visit.    No Known Allergies  ROS Review of Systems   PE; Blood pressure 120/77, pulse 62, temperature 97.1 F (36.2 C), temperature source Temporal, resp. rate 18, height 5' 6.5" (1.689 m), weight 194 lb (87.998 kg), SpO2 97.00%. Gen: Alert, well appearing.  Patient is oriented to person, place, time, and situation. AFFECT: pleasant, lucid thought and speech. ENT: Ears: EACs clear, normal epithelium.  TMs with good light reflex and landmarks bilaterally.  Eyes: no injection, icteris, swelling, or exudate.  EOMI, PERRLA. Nose: no drainage or turbinate edema/swelling.  No injection or focal lesion.  Mouth: lips without lesion/swelling.  Oral mucosa pink and moist.  Dentition intact and without obvious caries or gingival swelling.  Oropharynx without erythema, exudate, or swelling.  Neck: supple/nontender.  No LAD, mass, or TM.  Carotid pulses 2+ bilaterally, without bruits. CV: RRR, no m/r/g.   LUNGS: CTA bilat, nonlabored resps, good aeration in all lung fields. ABD: soft, NT, ND, BS normal.  No hepatospenomegaly or mass.  No bruits. EXT: no clubbing, cyanosis, or edema.  Musculoskeletal: no joint swelling, erythema, warmth, or tenderness.  ROM of all joints intact. Skin - no sores or suspicious lesions or rashes or color changes   Pertinent labs:  Lab Results  Component Value Date   CHOL 136 07/26/2013   HDL 30.00* 07/26/2013   LDLCALC 70 07/26/2013   LDLDIRECT 87.2  09/03/2010   TRIG 178.0* 07/26/2013   CHOLHDL 5 07/26/2013   Lab Results  Component Value Date   HGBA1C 6.9* 07/26/2013     Chemistry      Component Value Date/Time   NA 134* 05/31/2013 1045   K 4.8 05/31/2013 1045   CL 102 05/31/2013 1045   CO2 23 05/31/2013 1045  BUN 14 05/31/2013 1045   CREATININE 1.08 05/31/2013 1045      Component Value Date/Time   CALCIUM 9.9 05/31/2013 1045   ALKPHOS 54 10/10/2012 0926   AST 24 10/10/2012 0926   ALT 29 10/10/2012 0926   BILITOT 1.0 10/10/2012 0926     Lab Results  Component Value Date   WBC 7.1 05/31/2013   HGB 16.3 05/31/2013   HCT 46.3 05/31/2013   MCV 87.4 05/31/2013   PLT 145* 05/31/2013     ASSESSMENT AND PLAN:   Health maintenance examination Reviewed age and gender appropriate health maintenance issues (prudent diet, regular exercise, health risks of tobacco and excessive alcohol, use of seatbelts, fire alarms in home, use of sunscreen).  Also reviewed age and gender appropriate health screening as well as vaccine recommendations. Prostate cancer and cancer screening UTD. He has a deep, complete left EAC cerumen impaction.  I gave the option of ENT referral but he declined today, opting instead for daily OTC debrox drops and f/u in office in a few weeks for recheck.   Elevated PSA Most recent PSA 05/2013 normal per pt (via urologist).  Type II or unspecified type diabetes mellitus without mention of complication, not stated as uncontrolled Recent A1c 6.9%. UTD on DM monitoring.  Prevnar 37 after age 55 (per his insurer).  Second pneumovax after age 63.  An After Visit Summary was printed and given to the patient.  FOLLOW UP:  Return for 6 mo f/u routine DM f/u; a few weeks for recheck left ear cerumen impaction.

## 2013-08-02 ENCOUNTER — Telehealth: Payer: Self-pay

## 2013-08-02 NOTE — Telephone Encounter (Signed)
Relevant patient education assigned to patient using Emmi. ° °

## 2013-08-13 ENCOUNTER — Encounter: Payer: Self-pay | Admitting: Nurse Practitioner

## 2013-08-13 ENCOUNTER — Ambulatory Visit (INDEPENDENT_AMBULATORY_CARE_PROVIDER_SITE_OTHER): Payer: Managed Care, Other (non HMO) | Admitting: Nurse Practitioner

## 2013-08-13 ENCOUNTER — Telehealth: Payer: Self-pay

## 2013-08-13 VITALS — BP 120/78 | HR 82 | Temp 98.0°F | Ht 66.5 in | Wt 191.0 lb

## 2013-08-13 DIAGNOSIS — H6092 Unspecified otitis externa, left ear: Secondary | ICD-10-CM

## 2013-08-13 DIAGNOSIS — H60399 Other infective otitis externa, unspecified ear: Secondary | ICD-10-CM

## 2013-08-13 DIAGNOSIS — H6120 Impacted cerumen, unspecified ear: Secondary | ICD-10-CM

## 2013-08-13 DIAGNOSIS — J111 Influenza due to unidentified influenza virus with other respiratory manifestations: Secondary | ICD-10-CM

## 2013-08-13 DIAGNOSIS — H6122 Impacted cerumen, left ear: Secondary | ICD-10-CM

## 2013-08-13 MED ORDER — NEOMYCIN-POLYMYXIN-HC 1 % OT SOLN: 3.0000 [drp] | Freq: Four times a day (QID) | OTIC | Status: DC

## 2013-08-13 MED ORDER — OSELTAMIVIR PHOSPHATE 75 MG PO CAPS: 75.0000 mg | ORAL_CAPSULE | Freq: Two times a day (BID) | ORAL | Status: DC

## 2013-08-13 NOTE — Patient Instructions (Addendum)
You likely have a viral illness, it may be flu. The average duration is 5-10 days. Treatment is largely symptom management. Start tamiflu to shorten the illness. For sinus congestion, start daily sinus rinses (neilmed Sinus Rinse).  For aches & fever alternate tylenol & ibuprophen every 4-6 hours. Sip fluids every hour. Rest. If you are not feeling better in 1 week or develop fever or chest pain, call us for re-evaluation. For Left ear, use drops 4 times daily for 7 days. Keep ear dry with cotton ball when getting into shower. In one week, if ear is well-no pain, flush ears with hydrogen & warm water 2-3 times week when in the shower to prevent wax build up. Feel better!    Influenza A (H1N1) H1N1 formerly called "swine flu" is a new influenza virus causing sickness in people. The H1N1 virus is different from seasonal influenza viruses. However, the H1N1 symptoms are similar to seasonal influenza and it is spread from person to person. You may be at higher risk for serious problems if you have underlying serious medical conditions. The CDC and the [facility] are following reported cases around the world. CAUSES   The flu is thought to spread mainly person-to-person through coughing or sneezing of infected people.  A person may become infected by touching something with the virus on it and then touching their mouth or nose. SYMPTOMS   Fever.  Headache.  Tiredness.  Cough.  Sore throat.  Runny or stuffy nose.  Body aches.  Diarrhea and vomiting These symptoms are referred to as "flu-like symptoms." A lot of different illnesses, including the common cold, may have similar symptoms. DIAGNOSIS   There are tests that can tell if you have the H1N1 virus.  Confirmed cases of H1N1 will be reported to the state or local health department.  A doctor's exam may be needed to tell whether you have an infection that is a complication of the flu. HOME CARE INSTRUCTIONS   Stay  informed. Visit the [facility] website for current recommendations. Visit [facility]. You may also call 1-800-CDC-INFO (520)580-9790).  Get help early if you develop any of the above symptoms.  If you are at high risk from complications of the flu, talk to your caregiver as soon as you develop flu-like symptoms. Those at higher risk for complications include:  People 65 years or older.  People with chronic medical conditions.  Pregnant women.  Young children.  Your caregiver may recommend antiviral medicine to help treat the flu.  If you get the flu, get plenty of rest, drink enough water and fluids to keep your urine clear or pale yellow, and avoid using alcohol or tobacco.  You may take over-the-counter medicine to relieve the symptoms of the flu if your caregiver approves. (Never give aspirin to children or teenagers who have flu-like symptoms, particularly fever). TREATMENT  If you do get sick, antiviral drugs are available. These drugs can make your illness milder and make you feel better faster. Treatment should start soon after illness starts. It is only effective if taken within the first day of becoming ill. Only your caregiver can prescribe antiviral medication.  PREVENTION   Cover your nose and mouth with a tissue or your arm when you cough or sneeze. Throw the tissue away.  Wash your hands often with soap and warm water, especially after you cough or sneeze. Alcohol-based cleaners are also effective against germs.  Avoid touching your eyes, nose or mouth. This is one way germs  spread.  Try to avoid contact with sick people. Follow public health advice regarding school closures. Avoid crowds.  Stay home if you get sick. Limit contact with others to keep from infecting them. People infected with the H1N1 virus may be able to infect others anywhere from 1 day before feeling sick to 5-7 days after getting flu symptoms.  An H1N1 vaccine is available to help protect  against the virus. In addition to the H1N1 vaccine, you will need to be vaccinated for seasonal influenza. The H1N1 and seasonal vaccines may be given on the same day. The CDC especially recommends the H1N1 vaccine for:  Pregnant women.  People who live with or care for children younger than 30 months of age.  Health care and emergency services personnel.  Persons between the ages of 30 months through 58 years of age.  People from ages 39 through 10 years who are at higher risk for H1N1 because of chronic health disorders or immune system problems. FACEMASKS In community and home settings, the use of facemasks and N95 respirators are not normally recommended. In certain circumstances, a facemask or N95 respirator may be used for persons at increased risk of severe illness from influenza. Your caregiver can give additional recommendations for facemask use. IN CHILDREN, EMERGENCY WARNING SIGNS THAT NEED URGENT MEDICAL CARE:  Fast breathing or trouble breathing.  Bluish skin color.  Not drinking enough fluids.  Not waking up or not interacting normally.  Being so fussy that the child does not want to be held.  Your child has an oral temperature above 102 F (38.9 C), not controlled by medicine.  Your baby is older than 3 months with a rectal temperature of 102 F (38.9 C) or higher.  Your baby is 57 months old or younger with a rectal temperature of 100.4 F (38 C) or higher.  Flu-like symptoms improve but then return with fever and worse cough. IN ADULTS, EMERGENCY WARNING SIGNS THAT NEED URGENT MEDICAL CARE:  Difficulty breathing or shortness of breath.  Pain or pressure in the chest or abdomen.  Sudden dizziness.  Confusion.  Severe or persistent vomiting.  Bluish color.  You have a oral temperature above 102 F (38.9 C), not controlled by medicine.  Flu-like symptoms improve but return with fever and worse cough. SEEK IMMEDIATE MEDICAL CARE IF:  You or someone you  know is experiencing any of the above symptoms. When you arrive at the emergency center, report that you think you have the flu. You may be asked to wear a mask and/or sit in a secluded area to protect others from getting sick. MAKE SURE YOU:   Understand these instructions.  Will watch your condition.  Will get help right away if you are not doing well or get worse. Some of this information courtesy of the CDC.  Document Released: 12/28/2007 Document Revised: 10/03/2011 Document Reviewed: 12/28/2007 [facility] Patient Information 2014 [place], [place].  Otitis Externa Otitis externa is a bacterial or fungal infection of the outer ear canal. This is the area from the eardrum to the outside of the ear. Otitis externa is sometimes called "swimmer's ear." CAUSES  Possible causes of infection include:  Swimming in dirty water.  Moisture remaining in the ear after swimming or bathing.  Mild injury (trauma) to the ear.  Objects stuck in the ear (foreign body).  Cuts or scrapes (abrasions) on the outside of the ear. SYMPTOMS  The first symptom of infection is often itching in the ear canal. Later  signs and symptoms may include swelling and redness of the ear canal, ear pain, and yellowish-white fluid (pus) coming from the ear. The ear pain may be worse when pulling on the earlobe. DIAGNOSIS  Your caregiver will perform a physical exam. A sample of fluid may be taken from the ear and examined for bacteria or fungi. TREATMENT  Antibiotic ear drops are often given for 10 to 14 days. Treatment may also include pain medicine or corticosteroids to reduce itching and swelling. PREVENTION   Keep your ear dry. Use the corner of a towel to absorb water out of the ear canal after swimming or bathing.  Avoid scratching or putting objects inside your ear. This can damage the ear canal or remove the protective wax that lines the canal. This makes it easier for bacteria and fungi to grow.  Avoid  swimming in lakes, polluted water, or poorly chlorinated pools.  You may use ear drops made of rubbing alcohol and vinegar after swimming. Combine equal parts of white vinegar and alcohol in a bottle. Put 3 or 4 drops into each ear after swimming. HOME CARE INSTRUCTIONS   Apply antibiotic ear drops to the ear canal as prescribed by your caregiver.  Only take over-the-counter or prescription medicines for pain, discomfort, or fever as directed by your caregiver.  If you have diabetes, follow any additional treatment instructions from your caregiver.  Keep all follow-up appointments as directed by your caregiver. SEEK MEDICAL CARE IF:   You have a fever.  Your ear is still red, swollen, painful, or draining pus after 3 days.  Your redness, swelling, or pain gets worse.  You have a severe headache.  You have redness, swelling, pain, or tenderness in the area behind your ear. MAKE SURE YOU:   Understand these instructions.  Will watch your condition.  Will get help right away if you are not doing well or get worse. Document Released: 07/11/2005 Document Revised: 10/03/2011 Document Reviewed: 07/28/2011 [facility] Patient Information 2014 [place].

## 2013-08-13 NOTE — Progress Notes (Signed)
   Subjective:    Patient ID: Mark Blankenship, male    DOB: 1955-11-27, 58 y.o.   MRN: [id number]  Otalgia  There is pain in the left ear. This is a new problem. The current episode started yesterday. The problem occurs constantly. The problem has been gradually worsening. Maximum temperature: pt reports low grade fever yesterday. The fever has been present for less than 1 day. The pain is moderate. Associated symptoms include coughing and headaches. Pertinent negatives include no abdominal pain or sore throat. Associated symptoms comments: Nasal drainage. Ceruminosis found at routine visit last week, l ear.. He has tried acetaminophen for the symptoms. The treatment provided mild relief.      Review of Systems  Constitutional: Positive for fever, chills and fatigue.  HENT: Positive for congestion (nasal) and ear pain (Left). Negative for sore throat.   Respiratory: Positive for cough. Negative for chest tightness, shortness of breath and wheezing.   Cardiovascular: Negative for chest pain.  Gastrointestinal: Negative for abdominal pain.  Musculoskeletal: Negative for back pain.  Neurological: Positive for headaches.       Objective:   Physical Exam  Vitals reviewed. Constitutional: He is oriented to person, place, and time. He appears well-developed and well-nourished. No distress.  HENT:  Head: Normocephalic and atraumatic.  Right Ear: External ear normal.  Left Ear: External ear normal.  Mouth/Throat: Oropharynx is clear and moist. No oropharyngeal exudate.  R TM nml, L TM partially occluded by cerumen. L canal swollen & macerated. Wax has wet appearance-pt states he has been using debrox.  Eyes: Conjunctivae are normal. Right eye exhibits no discharge. Left eye exhibits no discharge.  Neck: Normal range of motion. Neck supple. No thyromegaly present.  Cardiovascular: Normal rate, regular rhythm and normal heart sounds.   No murmur heard. Pulmonary/Chest: Effort normal and  breath sounds normal. No respiratory distress. He has no wheezes.  Lymphadenopathy:    He has cervical adenopathy.  Neurological: He is alert and oriented to person, place, and time.  Skin: Skin is warm and dry.  Psychiatric: He has a normal mood and affect. His behavior is normal. Thought content normal.          Assessment & Plan:  1. Viral illness DD: flu, viral URI - oseltamivir (TAMIFLU) 75 MG capsule; Take 1 capsule (75 mg total) by mouth 2 (two) times daily.  Dispense: 10 capsule; Refill: 0 Asked pt to hold med if no fever today. 2. Otitis externa of left ear likely due to cerumen impaction - NEOMYCIN-POLYMYXIN-HYDROCORTISONE (CORTISPORIN) 1 % SOLN otic solution; Place 3 drops into the left ear 4 (four) times daily.  Dispense: 10 mL; Refill: 0  3. Excessive cerumen in left ear canal L ear irrigated with warm water. Scant remaining wax post irrigation, canal macerated, moderately swollen, TM intact.

## 2013-08-13 NOTE — Telephone Encounter (Signed)
Called pharmacy back and spoke to pharmacist. Pharmacy wanted clarification on patient instructions.

## 2013-08-13 NOTE — Progress Notes (Signed)
Pre-visit discussion using our clinic review tool. No additional management support is needed unless otherwise documented below in the visit note.  

## 2013-08-13 NOTE — Telephone Encounter (Signed)
The pharmacy called and needs clarification on medication.   Pharmacy - [facility] , name Sharee Pimple , callback [phone]  Thanks!

## 2013-08-20 ENCOUNTER — Other Ambulatory Visit: Payer: Self-pay | Admitting: Family Medicine

## 2013-08-20 ENCOUNTER — Other Ambulatory Visit: Payer: Self-pay | Admitting: *Deleted

## 2013-08-20 MED ORDER — ATENOLOL 50 MG PO TABS: 50.0000 mg | ORAL_TABLET | Freq: Every day | ORAL | Status: DC

## 2013-08-20 NOTE — Telephone Encounter (Signed)
Patient's pharmacy called stating they need a 90 day supply rx for attenolol.  Medication sent to pharmacy.

## 2013-08-22 ENCOUNTER — Encounter: Payer: Self-pay | Admitting: Family Medicine

## 2013-08-22 ENCOUNTER — Ambulatory Visit (INDEPENDENT_AMBULATORY_CARE_PROVIDER_SITE_OTHER): Payer: Managed Care, Other (non HMO) | Admitting: Family Medicine

## 2013-08-22 VITALS — BP 123/81 | HR 64 | Temp 98.0°F | Resp 18 | Ht 66.5 in | Wt 192.0 lb

## 2013-08-22 DIAGNOSIS — H6120 Impacted cerumen, unspecified ear: Secondary | ICD-10-CM

## 2013-08-22 NOTE — Progress Notes (Signed)
Pre visit review using our clinic review tool, if applicable. No additional management support is needed unless otherwise documented below in the visit note. 

## 2013-08-22 NOTE — Progress Notes (Signed)
OFFICE NOTE  08/22/2013  CC:  Chief Complaint  Patient presents with  . Follow-up     HPI: Patient is a 58 y.o. Caucasian male who is here for recheck of ears, hx of cerumen impaction. Has been using OTC debrox drops the last week after being seen here for flu -like illness. No ear pain, no c/o hearing deficit currently.   Pertinent PMH:  Past Medical History  Diagnosis Date  . History of bladder cancer 2012    Mass removed, plus gets ongoing BCG bladder infusions by urologist (Dr. Alinda Money).  Marland Kitchen CAD (coronary artery disease)      '05 MI > DES to circumflex  . HTN (hypertension)   . Hyperlipidemia   . Diabetes mellitus type 2, controlled     HbA1c 02/2011 was 6.6%  . Wolf-Parkinson-White syndrome   . Tobacco abuse   . BPH (benign prostatic hyperplasia)     with hx of acute prostatitis  . Elevated PSA 09/2011    with prostate nodule.  Prostate bx ALL BENIGN 10/10/11 (Dr. Alinda Money)  . Myocardial infarction 2005  . GERD (gastroesophageal reflux disease)     occasional  . Headache(784.0)     cluster headaches a long time ago  . Arthritis   . Complication of anesthesia     "tried to fight when waking up"    MEDS:  Outpatient Prescriptions Prior to Visit  Medication Sig Dispense Refill  . acetaminophen (TYLENOL) 500 MG tablet Take 1,000 mg by mouth every 6 (six) hours as needed for mild pain or headache.       Marland Kitchen aspirin 81 MG tablet Take 81 mg by mouth daily.       Marland Kitchen atenolol (TENORMIN) 50 MG tablet Take 1 tablet (50 mg total) by mouth daily with lunch.  90 tablet  0  . fluticasone (FLONASE) 50 MCG/ACT nasal spray Place 1 spray into both nostrils daily.      Marland Kitchen glipiZIDE (GLUCOTROL XL) 5 MG 24 hr tablet Take 5 mg by mouth daily with breakfast.      . HYDROcodone-acetaminophen (NORCO/VICODIN) 5-325 MG per tablet Take 1-2 tablets by mouth every 6 (six) hours as needed.  25 tablet  0  . lisinopril (PRINIVIL,ZESTRIL) 20 MG tablet Take 1 tablet (20 mg total) by mouth daily with lunch.   90 tablet  0  . metFORMIN (GLUCOPHAGE) 1000 MG tablet Take 1,000 mg by mouth 2 (two) times daily with a meal.      . NEOMYCIN-POLYMYXIN-HYDROCORTISONE (CORTISPORIN) 1 % SOLN otic solution Place 3 drops into the left ear 4 (four) times daily.  10 mL  0  . simvastatin (ZOCOR) 40 MG tablet Take 40 mg by mouth every evening.      . tamsulosin (FLOMAX) 0.4 MG CAPS capsule Take 0.4 mg by mouth daily after supper.      Marland Kitchen oseltamivir (TAMIFLU) 75 MG capsule Take 1 capsule (75 mg total) by mouth 2 (two) times daily.  10 capsule  0   No facility-administered medications prior to visit.    PE: Blood pressure 123/81, pulse 64, temperature 98 F (36.7 C), temperature source Temporal, resp. rate 18, height 5' 6.5" (1.689 m), weight 192 lb (87.091 kg), SpO2 98.00%. Gen: Alert, well appearing.  Patient is oriented to person, place, time, and situation. Right EAC clear, TM normal. Left EAC with sparse cerumen throughout the length of the canal, EAC and TM appear moist but are otherwise normal.    IMPRESSION AND PLAN: Cerumen impaction; resolved.  Has recurrent probs with left ear collecting cerumen/not emptying water after showers like the other ear. He'll try sporadic use of OTC swimmers ear drops.  No further recommendations at this time.  FOLLOW UP: prn

## 2013-09-09 ENCOUNTER — Other Ambulatory Visit: Payer: Self-pay | Admitting: Family Medicine

## 2013-09-09 MED ORDER — TAMSULOSIN HCL 0.4 MG PO CAPS: 0.4000 mg | ORAL_CAPSULE | Freq: Every day | ORAL | Status: DC

## 2013-09-23 ENCOUNTER — Other Ambulatory Visit: Payer: Self-pay

## 2013-09-23 MED ORDER — LISINOPRIL 20 MG PO TABS: 20.0000 mg | ORAL_TABLET | Freq: Every day | ORAL | Status: DC

## 2013-10-10 ENCOUNTER — Telehealth: Payer: Self-pay

## 2013-10-10 ENCOUNTER — Other Ambulatory Visit: Payer: Self-pay | Admitting: Family Medicine

## 2013-10-10 MED ORDER — METFORMIN HCL 1000 MG PO TABS: 1000.0000 mg | ORAL_TABLET | Freq: Two times a day (BID) | ORAL | Status: DC

## 2013-10-10 MED ORDER — FLUTICASONE PROPIONATE 50 MCG/ACT NA SUSP: 1.0000 | Freq: Every day | NASAL | Status: DC

## 2013-10-10 NOTE — Telephone Encounter (Signed)
Pharmacy called and needs a 90 day supply on Metformin so his insurance will pay for it. I will re submit a 90 day supply.

## 2013-11-12 ENCOUNTER — Encounter: Payer: Self-pay | Admitting: *Deleted

## 2013-11-12 ENCOUNTER — Encounter: Payer: Self-pay | Admitting: Cardiology

## 2013-11-12 ENCOUNTER — Ambulatory Visit (INDEPENDENT_AMBULATORY_CARE_PROVIDER_SITE_OTHER): Payer: Managed Care, Other (non HMO) | Admitting: Cardiology

## 2013-11-12 VITALS — BP 126/74 | HR 54 | Ht 66.5 in | Wt 193.0 lb

## 2013-11-12 DIAGNOSIS — E7800 Pure hypercholesterolemia, unspecified: Secondary | ICD-10-CM

## 2013-11-12 DIAGNOSIS — I2510 Atherosclerotic heart disease of native coronary artery without angina pectoris: Secondary | ICD-10-CM

## 2013-11-12 DIAGNOSIS — I10 Essential (primary) hypertension: Secondary | ICD-10-CM

## 2013-11-12 NOTE — Progress Notes (Signed)
HPI: Mark Blankenship is a pleasant gentleman who has a history of coronary disease status post drug-eluting stent to the circumflex in August 2005. His last Myoview was performed in Feb 2012. The patient's ejection fraction was 55%. There was no sign of scar or ischemia. I last saw him in March of 2014. Since then the patient denies any dyspnea on exertion, orthopnea, PND, pedal edema, palpitations, syncope or chest pain.   Current Outpatient Prescriptions  Medication Sig Dispense Refill  . acetaminophen (TYLENOL) 500 MG tablet Take 1,000 mg by mouth every 6 (six) hours as needed for mild pain or headache.       Marland Kitchen aspirin 81 MG tablet Take 81 mg by mouth daily.       Marland Kitchen atenolol (TENORMIN) 50 MG tablet Take 1 tablet (50 mg total) by mouth daily with lunch.  90 tablet  0  . fluticasone (FLONASE) 50 MCG/ACT nasal spray Place 1 spray into both nostrils daily.  16 g  3  . glipiZIDE (GLUCOTROL XL) 5 MG 24 hr tablet Take 5 mg by mouth daily with breakfast.      . HYDROcodone-acetaminophen (NORCO/VICODIN) 5-325 MG per tablet Take 1-2 tablets by mouth every 6 (six) hours as needed.  25 tablet  0  . lisinopril (PRINIVIL,ZESTRIL) 20 MG tablet Take 1 tablet (20 mg total) by mouth daily with lunch.  90 tablet  0  . metFORMIN (GLUCOPHAGE) 1000 MG tablet Take 1 tablet (1,000 mg total) by mouth 2 (two) times daily with a meal.  180 tablet  0  . NEOMYCIN-POLYMYXIN-HYDROCORTISONE (CORTISPORIN) 1 % SOLN otic solution Place 3 drops into the left ear 4 (four) times daily.  10 mL  0  . simvastatin (ZOCOR) 40 MG tablet Take 40 mg by mouth every evening.      . tamsulosin (FLOMAX) 0.4 MG CAPS capsule Take 1 capsule (0.4 mg total) by mouth daily after supper.  90 capsule  1   No current facility-administered medications for this visit.     Past Medical History  Diagnosis Date  . History of bladder cancer 2012    Mass removed, plus gets ongoing BCG bladder infusions by urologist (Dr. Alinda Money).  Marland Kitchen CAD (coronary  artery disease)      '05 MI > DES to circumflex  . HTN (hypertension)   . Hyperlipidemia   . Diabetes mellitus type 2, controlled     HbA1c 02/2011 was 6.6%  . Wolf-Parkinson-White syndrome   . Tobacco abuse   . BPH (benign prostatic hyperplasia)     with hx of acute prostatitis  . Elevated PSA 09/2011    with prostate nodule.  Prostate bx ALL BENIGN 10/10/11 (Dr. Alinda Money)  . Myocardial infarction 2005  . GERD (gastroesophageal reflux disease)     occasional  . Headache(784.0)     cluster headaches a long time ago  . Arthritis   . Complication of anesthesia     "tried to fight when waking up"    Past Surgical History  Procedure Laterality Date  . Bladder tumor excision  11/08/10    urothelial carcinoma (Dr. Alinda Money).  . Myocardial perfusion study  2009    Neg ischemia, EF 52%  . Lexiscan  2012    Normal/negative  . Lumbar spine surgery  [facility] in [place] ([facility])-no trouble since last surgery  . Colonoscopy  2010    Dr. Isa Rankin, repeat 2020.  Patient wants to go to different GI when  time for repeat.  . Vasectomy    . Cardiac catheterization  2005    with stent placement  . Transurethral resection of bladder tumor N/A 06/06/2013    Procedure: TRANSURETHRAL RESECTION OF BLADDER TUMOR (TURBT);  Surgeon: Dutch Gray, MD;  Location: [facility];  Service: Urology;  Laterality: N/A;.  PATH: squamous metaplasia, fibrosis with chronic inflammation, no malignancy.  . Cystoscopy with retrograde pyelogram, ureteroscopy and stent placement N/A 06/06/2013    Procedure: CYSTOSCOPY WITH RETROGRADE PYELOGRAM;  Surgeon: Dutch Gray, MD;  Location: [facility];  Service: Urology;  Laterality: N/A;    History   Social History  . Marital Status: Married    Spouse Name: N/A    Number of Children: N/A  . Years of Education: N/A   Occupational History  . Not on file.   Social History Main Topics  . Smoking status: Former Smoker -- 30 years    Types: Cigarettes    Quit date:  07/26/2011  . Smokeless tobacco: Never Used  . Alcohol Use: No  . Drug Use: No  . Sexual Activity: Not on file   Other Topics Concern  . Not on file   Social History Narrative   Married, 2 children, 4 grandchildren--[place].   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   Tob: 30 pack-yr hx, quit 08/2011.   No alcohol or drug use.   Active on farm but no formal exercise regimen.    ROS: no fevers or chills, productive cough, hemoptysis, dysphasia, odynophagia, melena, hematochezia, dysuria, hematuria, rash, seizure activity, orthopnea, PND, pedal edema, claudication. Remaining systems are negative.  Physical Exam: Well-developed well-nourished in no acute distress.  Skin is warm and dry.  HEENT is normal.  Neck is supple.  Chest is clear to auscultation with normal expansion.  Cardiovascular exam is regular rate and rhythm.  Abdominal exam nontender or distended. No masses palpated. Extremities show no edema. neuro grossly intact  ECG Sinus rhythm at a rate of 54. Left ventricular hypertrophy. Inferior infarct.

## 2013-11-12 NOTE — Assessment & Plan Note (Signed)
Continue statin. Check lipids and liver. 

## 2013-11-12 NOTE — Assessment & Plan Note (Signed)
Blood pressure controlled. Continue present medications. Check potassium and renal function. 

## 2013-11-12 NOTE — Patient Instructions (Signed)
Your physician wants you to follow-up in: [place] will receive a reminder letter in the mail two months in advance. If you don't receive a letter, please call our office to schedule the follow-up appointment.   Your physician has requested that you have en exercise stress myoview. For further information please visit [email or web address]. Please follow instruction sheet, as given.   Your physician recommends that you return for lab work WITH STRESS TEST

## 2013-11-12 NOTE — Assessment & Plan Note (Signed)
Continue aspirin and statin. ECG suggested inferior infarct not present on prior. Schedule nuclear study for risk stratification. No chest pain.

## 2013-11-14 ENCOUNTER — Other Ambulatory Visit: Payer: Self-pay

## 2013-11-21 ENCOUNTER — Other Ambulatory Visit: Payer: Self-pay | Admitting: Family Medicine

## 2013-11-21 MED ORDER — ATENOLOL 50 MG PO TABS: 50.0000 mg | ORAL_TABLET | Freq: Every day | ORAL | Status: DC

## 2013-11-21 MED ORDER — GLIPIZIDE ER 5 MG PO TB24: 5.0000 mg | ORAL_TABLET | Freq: Every day | ORAL | Status: DC

## 2013-11-22 ENCOUNTER — Other Ambulatory Visit: Payer: Self-pay | Admitting: Family Medicine

## 2013-11-22 MED ORDER — SIMVASTATIN 40 MG PO TABS: 40.0000 mg | ORAL_TABLET | Freq: Every evening | ORAL | Status: DC

## 2013-11-25 ENCOUNTER — Encounter: Payer: Self-pay | Admitting: Cardiology

## 2013-11-27 ENCOUNTER — Ambulatory Visit (HOSPITAL_COMMUNITY): Payer: Managed Care, Other (non HMO) | Attending: Cardiology | Admitting: Radiology

## 2013-11-27 ENCOUNTER — Other Ambulatory Visit (INDEPENDENT_AMBULATORY_CARE_PROVIDER_SITE_OTHER): Payer: Managed Care, Other (non HMO)

## 2013-11-27 VITALS — BP 99/67 | Ht 68.0 in | Wt 187.0 lb

## 2013-11-27 DIAGNOSIS — I2510 Atherosclerotic heart disease of native coronary artery without angina pectoris: Secondary | ICD-10-CM

## 2013-11-27 LAB — HEPATIC FUNCTION PANEL
ALBUMIN: 4.1 g/dL (ref 3.5–5.2)
ALT: 26 U/L (ref 0–53)
AST: 27 U/L (ref 0–37)
Alkaline Phosphatase: 44 U/L (ref 39–117)
Bilirubin, Direct: 0.1 mg/dL (ref 0.0–0.3)
TOTAL PROTEIN: 6.8 g/dL (ref 6.0–8.3)
Total Bilirubin: 0.6 mg/dL (ref 0.2–1.2)

## 2013-11-27 LAB — BASIC METABOLIC PANEL
BUN: 15 mg/dL (ref 6–23)
CHLORIDE: 106 meq/L (ref 96–112)
CO2: 28 mEq/L (ref 19–32)
Calcium: 9.7 mg/dL (ref 8.4–10.5)
Creatinine, Ser: 1.2 mg/dL (ref 0.4–1.5)
GFR: 69.31 mL/min (ref >60.00)
Glucose, Bld: 136 mg/dL — ABNORMAL HIGH (ref 70–99)
Potassium: 4.4 mEq/L (ref 3.5–5.1)
Sodium: 139 mEq/L (ref 135–145)

## 2013-11-27 LAB — LIPID PANEL
CHOLESTEROL: 103 mg/dL (ref 0–200)
HDL: 27.8 mg/dL — ABNORMAL LOW (ref >39.00)
LDL Cholesterol: 58 mg/dL (ref 0–99)
Total CHOL/HDL Ratio: 4
Triglycerides: 86 mg/dL (ref 0.0–149.0)
VLDL: 17.2 mg/dL (ref 0.0–40.0)

## 2013-11-27 MED ORDER — TECHNETIUM TC 99M SESTAMIBI GENERIC - CARDIOLITE
30.0000 | Freq: Once | INTRAVENOUS | Status: AC | PRN
  Administered 2013-11-27: 30 via INTRAVENOUS

## 2013-11-27 MED ORDER — REGADENOSON 0.4 MG/5ML IV SOLN
0.4000 mg | Freq: Once | INTRAVENOUS | Status: AC
  Administered 2013-11-27: 0.4 mg via INTRAVENOUS

## 2013-11-27 MED ORDER — TECHNETIUM TC 99M SESTAMIBI GENERIC - CARDIOLITE
10.0000 | Freq: Once | INTRAVENOUS | Status: AC | PRN
  Administered 2013-11-27: 10 via INTRAVENOUS

## 2013-11-27 NOTE — Progress Notes (Addendum)
[facility] SITE 3 NUCLEAR MED [place], [place] [address] [phone]    Cardiology Nuclear Med Study  Mark Blankenship is a 58 y.o. male     MRN : [id number]     DOB: Sep 19, 1955  Procedure Date: 11/27/2013  Nuclear Med Background Indication for Stress Test:  Evaluation for Ischemia and Stent Patency History:  '12 MPI: EF: 55% CAD-MI-CATH-Stent-CFX-'05 Cardiac Risk Factors: Premature Family History - CAD, History of Smoking, Hypertension, Lipids and NIDDM  Symptoms:  DOE and Palpitations   Nuclear Pre-Procedure Caffeine/Decaff Intake:  None > 12 hrs NPO After: 9:00pm   Lungs:  clear O2 Sat: 98% on room air. IV 0.9% NS with Angio Cath:  22g  IV Site: R Antecubital x 1, tolerated well IV Started by:  Irven Baltimore, RN  Chest Size (in):  44 Cup Size: n/a  Height: 5\' 8"  (1.727 m)  Weight:  187 lb (84.823 kg)  BMI:  Body mass index is 28.44 kg/(m^2). Tech Comments:  Held Atenolol x 48 hrs. No medications (Metformin, and Glipizide) this am. Irven Baltimore, RN.    Nuclear Med Study 1 or 2 day study: 1 day  Stress Test Type:  Treadmill/Lexiscan  Reading MD: N/A  Order Authorizing Provider:  Kirk Ruths, MD  Resting Radionuclide: Technetium 76m Sestamibi  Resting Radionuclide Dose: 11.0 mCi   Stress Radionuclide:  Technetium 2m Sestamibi  Stress Radionuclide Dose: 33.0 mCi           Stress Protocol Rest HR: 54 Stress HR: 112  Rest BP: 99/67 Stress BP: 166/82  Exercise Time (min): 7:22 METS: 7.0   Predicted Max HR: 162 bpm % Max HR: 69.14 bpm Rate Pressure Product: 18592   Dose of Adenosine (mg):  n/a Dose of Lexiscan: 0.4 mg  Dose of Atropine (mg): n/a Dose of Dobutamine: n/a mcg/kg/min (at max HR)  Stress Test Technologist: Perrin Maltese, EMT-P  Nuclear Technologist:  Charlton Amor, CNMT     Rest Procedure:  Myocardial perfusion imaging was performed at rest 45 minutes following the intravenous administration of Technetium 32m  Sestamibi. Rest ECG: NSR - Normal EKG  Stress Procedure:  The patient exercised on the treadmill utilizing the Bruce Protocol for 7:22 minutes. The patient stopped due to fatigue, unable to reach target HR, and denied any chest pain.  Technetium 1m Sestamibi was injected at peak exercise and myocardial perfusion imaging was performed after a brief delay. Stress ECG: No significant change from baseline ECG  QPS Raw Data Images:  Normal; no motion artifact; normal heart/lung ratio. Stress Images:  Normal homogeneous uptake in all areas of the myocardium. Rest Images:  There is decreased uptake in the basal inferior wall. Subtraction (SDS):  No evidence of ischemia. Transient Ischemic Dilatation (Normal <1.22):  1.14 Lung/Heart Ratio (Normal <0.45):  0.38  Quantitative Gated Spect Images QGS EDV:  121 ml QGS ESV:  61 ml  Impression Exercise Capacity:  Poor exercise capacity. BP Response:  Normal blood pressure response. Clinical Symptoms:  fatigue ECG Impression:  No significant ST segment change suggestive of ischemia  Comparison with Prior Nuclear Study: No significant change from previous study  Overall Impression:  Normal nuclear stress test  LV Ejection Fraction: 50%.  LV Wall Motion:  Low normal LVF   Signed: Fransico Him, MD [facility]

## 2013-12-20 ENCOUNTER — Other Ambulatory Visit: Payer: Self-pay

## 2013-12-20 MED ORDER — LISINOPRIL 20 MG PO TABS
20.0000 mg | ORAL_TABLET | Freq: Every day | ORAL | Status: DC
Start: 2013-12-20 — End: 2014-04-01

## 2014-01-03 ENCOUNTER — Other Ambulatory Visit: Payer: Self-pay | Admitting: Family Medicine

## 2014-01-03 MED ORDER — METFORMIN HCL 1000 MG PO TABS: 1000.0000 mg | ORAL_TABLET | Freq: Two times a day (BID) | ORAL | Status: DC

## 2014-01-03 NOTE — Telephone Encounter (Signed)
Sent in Rx for metformin.  Patient needs to keep his appointment on 02/18/14 to get anymore refills.

## 2014-02-18 ENCOUNTER — Ambulatory Visit (INDEPENDENT_AMBULATORY_CARE_PROVIDER_SITE_OTHER): Payer: Managed Care, Other (non HMO) | Admitting: Family Medicine

## 2014-02-18 ENCOUNTER — Encounter: Payer: Self-pay | Admitting: Family Medicine

## 2014-02-18 VITALS — BP 117/79 | HR 61 | Temp 98.4°F | Resp 18 | Ht 66.5 in | Wt 184.0 lb

## 2014-02-18 DIAGNOSIS — E119 Type 2 diabetes mellitus without complications: Secondary | ICD-10-CM

## 2014-02-18 LAB — MICROALBUMIN / CREATININE URINE RATIO
Creatinine,U: 245.3 mg/dL
Microalb Creat Ratio: 3 mg/g (ref 0.0–30.0)
Microalb, Ur: 7.3 mg/dL — ABNORMAL HIGH (ref 0.0–1.9)

## 2014-02-18 LAB — HEMOGLOBIN A1C: Hgb A1c MFr Bld: 6.6 % — ABNORMAL HIGH (ref 4.6–6.5)

## 2014-02-18 MED ORDER — GLIPIZIDE ER 5 MG PO TB24: 5.0000 mg | ORAL_TABLET | Freq: Every day | ORAL | Status: DC

## 2014-02-18 MED ORDER — ATENOLOL 50 MG PO TABS: 50.0000 mg | ORAL_TABLET | Freq: Every day | ORAL | Status: DC

## 2014-02-18 MED ORDER — SIMVASTATIN 40 MG PO TABS: 40.0000 mg | ORAL_TABLET | Freq: Every evening | ORAL | Status: DC

## 2014-02-18 NOTE — Progress Notes (Signed)
OFFICE NOTE  02/18/2014  CC:  Chief Complaint  Patient presents with  . Follow-up    6 month   HPI: Patient is a 58 y.o. Caucasian male who is here for 6 mo f/u DM 2, HTN, hyperlipidemia. Compliant with meds. No glucoses to report but has glucometer in case he needs to monitor glucoses.  No home bp monitoring.  No acute complaints.  Pertinent PMH:  Past medical, surgical, social, and family history reviewed and no changes are noted since last office visit.  MEDS:  Outpatient Prescriptions Prior to Visit  Medication Sig Dispense Refill  . acetaminophen (TYLENOL) 500 MG tablet Take 1,000 mg by mouth every 6 (six) hours as needed for mild pain or headache.       Marland Kitchen aspirin 81 MG tablet Take 81 mg by mouth daily.       Marland Kitchen lisinopril (PRINIVIL,ZESTRIL) 20 MG tablet Take 1 tablet (20 mg total) by mouth daily with lunch.  90 tablet  0  . metFORMIN (GLUCOPHAGE) 1000 MG tablet Take 1 tablet (1,000 mg total) by mouth 2 (two) times daily with a meal.  180 tablet  0  . tamsulosin (FLOMAX) 0.4 MG CAPS capsule Take 1 capsule (0.4 mg total) by mouth daily after supper.  90 capsule  1  . atenolol (TENORMIN) 50 MG tablet Take 1 tablet (50 mg total) by mouth daily with lunch.  90 tablet  1  . glipiZIDE (GLUCOTROL XL) 5 MG 24 hr tablet Take 1 tablet (5 mg total) by mouth daily with breakfast.  90 tablet  0  . NEOMYCIN-POLYMYXIN-HYDROCORTISONE (CORTISPORIN) 1 % SOLN otic solution Place 3 drops into the left ear 4 (four) times daily.  10 mL  0  . simvastatin (ZOCOR) 40 MG tablet Take 1 tablet (40 mg total) by mouth every evening.  90 tablet  1  . fluticasone (FLONASE) 50 MCG/ACT nasal spray Place 1 spray into both nostrils daily.  16 g  3  . HYDROcodone-acetaminophen (NORCO/VICODIN) 5-325 MG per tablet Take 1-2 tablets by mouth every 6 (six) hours as needed.  25 tablet  0   No facility-administered medications prior to visit.    PE: Blood pressure 117/79, pulse 61, temperature 98.4 F (36.9 C),  temperature source Temporal, resp. rate 18, height 5' 6.5" (1.689 m), weight 184 lb (83.462 kg), SpO2 96.00%. Gen: Alert, well appearing.  Patient is oriented to person, place, time, and situation. No further exam today.  LAB:    Chemistry      Component Value Date/Time   NA 139 11/27/2013 0750   K 4.4 11/27/2013 0750   CL 106 11/27/2013 0750   CO2 28 11/27/2013 0750   BUN 15 11/27/2013 0750   CREATININE 1.2 11/27/2013 0750      Component Value Date/Time   CALCIUM 9.7 11/27/2013 0750   ALKPHOS 44 11/27/2013 0750   AST 27 11/27/2013 0750   ALT 26 11/27/2013 0750   BILITOT 0.6 11/27/2013 0750     Lab Results  Component Value Date   CHOL 103 11/27/2013   HDL 27.80* 11/27/2013   LDLCALC 58 11/27/2013   LDLDIRECT 87.2 09/03/2010   TRIG 86.0 11/27/2013   CHOLHDL 4 11/27/2013   Lab Results  Component Value Date   HGBA1C 6.9* 07/26/2013   IMPRESSION AND PLAN:  1) DM 2, not compliant with home glucose monitoring. HbA1c and urine microalb/cr today.  2) HTN; The current medical regimen is effective;  continue present plan and medications.  3) Hyperlipidemia: The  current medical regimen is effective;  continue present plan and medications.  An After Visit Summary was printed and given to the patient.  FOLLOW UP: 6 mo for fasting CPE

## 2014-02-18 NOTE — Progress Notes (Signed)
Pre visit review using our clinic review tool, if applicable. No additional management support is needed unless otherwise documented below in the visit note. 

## 2014-03-04 ENCOUNTER — Other Ambulatory Visit: Payer: Self-pay | Admitting: Family Medicine

## 2014-04-01 ENCOUNTER — Other Ambulatory Visit: Payer: Self-pay | Admitting: Family Medicine

## 2014-05-06 ENCOUNTER — Ambulatory Visit: Payer: Managed Care, Other (non HMO)

## 2014-05-20 ENCOUNTER — Other Ambulatory Visit: Payer: Self-pay | Admitting: Family Medicine

## 2014-05-20 MED ORDER — SIMVASTATIN 40 MG PO TABS: 40.0000 mg | ORAL_TABLET | Freq: Every evening | ORAL | Status: DC

## 2014-07-14 ENCOUNTER — Other Ambulatory Visit: Payer: Self-pay | Admitting: Family Medicine

## 2014-08-20 ENCOUNTER — Ambulatory Visit (INDEPENDENT_AMBULATORY_CARE_PROVIDER_SITE_OTHER): Payer: BLUE CROSS/BLUE SHIELD | Admitting: Family Medicine

## 2014-08-20 ENCOUNTER — Encounter: Payer: Self-pay | Admitting: Family Medicine

## 2014-08-20 VITALS — BP 108/71 | HR 61 | Temp 97.8°F | Resp 18 | Ht 66.5 in | Wt 188.0 lb

## 2014-08-20 DIAGNOSIS — Z0000 Encounter for general adult medical examination without abnormal findings: Secondary | ICD-10-CM

## 2014-08-20 DIAGNOSIS — E119 Type 2 diabetes mellitus without complications: Secondary | ICD-10-CM

## 2014-08-20 DIAGNOSIS — Z23 Encounter for immunization: Secondary | ICD-10-CM

## 2014-08-20 LAB — LIPID PANEL
Cholesterol: 107 mg/dL (ref 0–200)
HDL: 27.5 mg/dL — ABNORMAL LOW (ref >39.00)
LDL Cholesterol: 63 mg/dL (ref 0–99)
NONHDL: 79.5
TRIGLYCERIDES: 82 mg/dL (ref 0.0–149.0)
Total CHOL/HDL Ratio: 4
VLDL: 16.4 mg/dL (ref 0.0–40.0)

## 2014-08-20 LAB — CBC WITH DIFFERENTIAL/PLATELET
BASOS PCT: 0.1 % (ref 0.0–3.0)
Basophils Absolute: 0 10*3/uL (ref 0.0–0.1)
EOS ABS: 0.1 10*3/uL (ref 0.0–0.7)
Eosinophils Relative: 2 % (ref 0.0–5.0)
HCT: 46.4 % (ref 39.0–52.0)
Hemoglobin: 16.2 g/dL (ref 13.0–17.0)
LYMPHS ABS: 1.2 10*3/uL (ref 0.7–4.0)
Lymphocytes Relative: 17.8 % (ref 12.0–46.0)
MCHC: 34.9 g/dL (ref 30.0–36.0)
MCV: 89.8 fl (ref 78.0–100.0)
Monocytes Absolute: 0.7 10*3/uL (ref 0.1–1.0)
Monocytes Relative: 10.6 % (ref 3.0–12.0)
Neutro Abs: 4.7 10*3/uL (ref 1.4–7.7)
Neutrophils Relative %: 69.5 % (ref 43.0–77.0)
Platelets: 118 10*3/uL — ABNORMAL LOW (ref 150.0–400.0)
RBC: 5.17 Mil/uL (ref 4.22–5.81)
RDW: 14 % (ref 11.5–15.5)
WBC: 6.8 10*3/uL (ref 4.0–10.5)

## 2014-08-20 LAB — HEMOGLOBIN A1C: Hgb A1c MFr Bld: 7.1 % — ABNORMAL HIGH (ref 4.6–6.5)

## 2014-08-20 LAB — COMPREHENSIVE METABOLIC PANEL
ALBUMIN: 4.3 g/dL (ref 3.5–5.2)
ALT: 19 U/L (ref 0–53)
AST: 23 U/L (ref 0–37)
Alkaline Phosphatase: 55 U/L (ref 39–117)
BUN: 14 mg/dL (ref 6–23)
CO2: 29 mEq/L (ref 19–32)
CREATININE: 1.12 mg/dL (ref 0.40–1.50)
Calcium: 9.2 mg/dL (ref 8.4–10.5)
Chloride: 105 mEq/L (ref 96–112)
GFR: 71.27 mL/min (ref >60.00)
GLUCOSE: 159 mg/dL — AB (ref 70–99)
POTASSIUM: 4.4 meq/L (ref 3.5–5.1)
SODIUM: 138 meq/L (ref 135–145)
TOTAL PROTEIN: 6.4 g/dL (ref 6.0–8.3)
Total Bilirubin: 0.6 mg/dL (ref 0.2–1.2)

## 2014-08-20 LAB — TSH: TSH: 1.34 u[IU]/mL (ref 0.35–4.50)

## 2014-08-20 NOTE — Assessment & Plan Note (Signed)
Reviewed age and gender appropriate health maintenance issues (prudent diet, regular exercise, health risks of tobacco and excessive alcohol, use of seatbelts, fire alarms in home, use of sunscreen).  Also reviewed age and gender appropriate health screening as well as vaccine recommendations. Pneumovax 23 given today (long smoking hx, DM 2, CAD). HP labs drawn fasting today.  Will complete his insurer's wellness program form when Tchol is back. Colon ca screening UTD: next colonoscopy due 2020. He gets regular DREs/PSAs via his urologist due to hx of mildly elevated PSA (neg bx in past), most recent 05/2014 per pt report. Will check HbA1c today for DM f/u. Will defer foot exam to next f/u in 55mo.

## 2014-08-20 NOTE — Progress Notes (Signed)
Office Note 08/20/2014  CC:  Chief Complaint  Patient presents with  . Annual Exam    fasting   HPI:  Mark Blankenship is a 59 y.o. White male who is here for CPE. Feeling good other than a couple of days of nasal congestion after snow plowing in open cab tractor. Not checking glucoses at home, denies feeling any hypoglycemic episodes.  He says he cannot afford dental care. He has plans for a routine eye exam soon, no acute eye complaints.   Past Medical History  Diagnosis Date  . History of bladder cancer 2012    Mass removed, plus gets ongoing BCG bladder infusions by urologist (Dr. Alinda Money).  Marland Kitchen CAD (coronary artery disease)      '05 MI > DES to circumflex  . HTN (hypertension)   . Hyperlipidemia   . Diabetes mellitus type 2, controlled     HbA1c 02/2011 was 6.6%  . Wolf-Parkinson-White syndrome   . Tobacco abuse   . BPH (benign prostatic hyperplasia)     with hx of acute prostatitis  . Elevated PSA 09/2011    with prostate nodule.  Prostate bx ALL BENIGN 10/10/11 (Dr. Alinda Money)  . Myocardial infarction 2005  . GERD (gastroesophageal reflux disease)     occasional  . Headache(784.0)     cluster headaches a long time ago  . Arthritis   . Complication of anesthesia     "tried to fight when waking up"    Past Surgical History  Procedure Laterality Date  . Bladder tumor excision  11/08/10    urothelial carcinoma (Dr. Alinda Money).  . Myocardial perfusion study  2009    Neg ischemia, EF 52%  . Lexiscan  2012    Normal/negative  . Lumbar spine surgery  [place] in [place] (Kritzer)-no trouble since last surgery  . Colonoscopy  2010    Dr. Isa Rankin, repeat 2020.  Patient wants to go to different GI when time for repeat.  . Vasectomy    . Cardiac catheterization  2005    with stent placement  . Transurethral resection of bladder tumor N/A 06/06/2013    Procedure: TRANSURETHRAL RESECTION OF BLADDER TUMOR (TURBT);  Surgeon: Dutch Gray, MD;  Location:  [facility];  Service: Urology;  Laterality: N/A;.  PATH: squamous metaplasia, fibrosis with chronic inflammation, no malignancy.  . Cystoscopy with retrograde pyelogram, ureteroscopy and stent placement N/A 06/06/2013    Procedure: CYSTOSCOPY WITH RETROGRADE PYELOGRAM;  Surgeon: Dutch Gray, MD;  Location: [facility];  Service: Urology;  Laterality: N/A;    Family History  Problem Relation Age of Onset  . Hypertension Mother   . Cancer Father     colon and prostate, mets to lungs.  . Hypertension Father   . Heart disease Brother     d. 16 MI  . Multiple sclerosis Brother   . Diabetes Brother   . Heart disease Paternal Uncle     History   Social History  . Marital Status: Married    Spouse Name: N/A    Number of Children: N/A  . Years of Education: N/A   Occupational History  . Not on file.   Social History Main Topics  . Smoking status: Former Smoker -- 30 years    Types: Cigarettes    Quit date: 07/26/2011  . Smokeless tobacco: Never Used  . Alcohol Use: No  . Drug Use: No  . Sexual Activity: Not on file   Other Topics Concern  .  Not on file   Social History Narrative   Married, 2 children, 4 grandchildren--[place].   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   Tob: 30 pack-yr hx, quit 08/2011.   No alcohol or drug use.   Active on farm but no formal exercise regimen.    Outpatient Prescriptions Prior to Visit  Medication Sig Dispense Refill  . acetaminophen (TYLENOL) 500 MG tablet Take 1,000 mg by mouth every 6 (six) hours as needed for mild pain or headache.     Marland Kitchen aspirin 81 MG tablet Take 81 mg by mouth daily.     Marland Kitchen atenolol (TENORMIN) 50 MG tablet Take 1 tablet (50 mg total) by mouth daily with lunch. 90 tablet 1  . fluticasone (FLONASE) 50 MCG/ACT nasal spray Place 1 spray into both nostrils daily. 16 g 3  . glipiZIDE (GLUCOTROL XL) 5 MG 24 hr tablet Take  1 tablet (5 mg total) by mouth daily with breakfast. 90 tablet 1  . lisinopril (PRINIVIL,ZESTRIL) 20 MG tablet TAKE ONE TABLET BY MOUTH EVERY DAY WITH LUNCH 90 tablet 1  . metFORMIN (GLUCOPHAGE) 1000 MG tablet TAKE ONE TABLET BY MOUTH TWICE DAILY WITH A MEAL 180 tablet 1  . simvastatin (ZOCOR) 40 MG tablet Take 1 tablet (40 mg total) by mouth every evening. 90 tablet 1  . tamsulosin (FLOMAX) 0.4 MG CAPS capsule TAKE 1 CAPSULE BY MOUTH DAILY AFTER SUPPER 90 capsule 1  . HYDROcodone-acetaminophen (NORCO/VICODIN) 5-325 MG per tablet Take 1-2 tablets by mouth every 6 (six) hours as needed. (Patient not taking: Reported on 08/20/2014) 25 tablet 0   No facility-administered medications prior to visit.    No Known Allergies  ROS Review of Systems  Constitutional: Negative for fever, chills, appetite change and fatigue.  HENT: Negative for congestion, dental problem, ear pain and sore throat.   Eyes: Negative for discharge, redness and visual disturbance.  Respiratory: Negative for cough, chest tightness, shortness of breath and wheezing.   Cardiovascular: Negative for chest pain, palpitations and leg swelling.  Gastrointestinal: Negative for nausea, vomiting, abdominal pain, diarrhea and blood in stool.  Genitourinary: Negative for dysuria, urgency, frequency, hematuria, flank pain and difficulty urinating.  Musculoskeletal: Negative for myalgias, back pain, joint swelling, arthralgias and neck stiffness.  Skin: Negative for pallor and rash.  Neurological: Negative for dizziness, speech difficulty, weakness and headaches.  Hematological: Negative for adenopathy. Does not bruise/bleed easily.  Psychiatric/Behavioral: Negative for confusion and sleep disturbance. The patient is not nervous/anxious.     PE; Blood pressure 108/71, pulse 61, temperature 97.8 F (36.6 C), temperature source Temporal, resp. rate 18, height 5' 6.5" (1.689 m), weight 188 lb (85.276 kg), SpO2 98 %. Gen: Alert, well  appearing.  Patient is oriented to person, place, time, and situation. AFFECT: pleasant, lucid thought and speech. ENT: Ears: EACs clear, normal epithelium.  TMs with good light reflex and landmarks bilaterally.  Eyes: no injection, icteris, swelling, or exudate.  EOMI, PERRLA. Nose: no drainage or turbinate edema/swelling.  No injection or focal lesion.  Mouth: lips without lesion/swelling.  Oral mucosa pink and moist.  Dentition in great disrepair--nubs of teeth in most spots.  Oropharynx without erythema, exudate, or swelling.  Neck: supple/nontender.  No LAD, mass, or TM.  Carotid pulses 2+ bilaterally, without bruits. CV: RRR, no m/r/g.   LUNGS: CTA bilat, nonlabored resps, good aeration in all lung fields. ABD:  soft, NT, ND, BS normal.  No hepatospenomegaly or mass.  No bruits. EXT: no clubbing, cyanosis, or edema.  Musculoskeletal: no joint swelling, erythema, warmth, or tenderness.  ROM of all joints intact. Skin - no sores or suspicious lesions or rashes or color changes Rectal: deferred (see a/p)  Pertinent labs:  None today RECENT: No results found for: TSH Lab Results  Component Value Date   WBC 7.1 05/31/2013   HGB 16.3 05/31/2013   HCT 46.3 05/31/2013   MCV 87.4 05/31/2013   PLT 145* 05/31/2013   Lab Results  Component Value Date   CREATININE 1.2 11/27/2013   BUN 15 11/27/2013   NA 139 11/27/2013   K 4.4 11/27/2013   CL 106 11/27/2013   CO2 28 11/27/2013   Lab Results  Component Value Date   ALT 26 11/27/2013   AST 27 11/27/2013   ALKPHOS 44 11/27/2013   BILITOT 0.6 11/27/2013   Lab Results  Component Value Date   CHOL 103 11/27/2013   Lab Results  Component Value Date   HDL 27.80* 11/27/2013   Lab Results  Component Value Date   LDLCALC 58 11/27/2013   Lab Results  Component Value Date   TRIG 86.0 11/27/2013   Lab Results  Component Value Date   CHOLHDL 4 11/27/2013   No results found for: PSA Lab Results  Component Value Date   HGBA1C  6.6* 02/18/2014    ASSESSMENT AND PLAN:   Health maintenance examination Reviewed age and gender appropriate health maintenance issues (prudent diet, regular exercise, health risks of tobacco and excessive alcohol, use of seatbelts, fire alarms in home, use of sunscreen).  Also reviewed age and gender appropriate health screening as well as vaccine recommendations. Pneumovax 23 given today (long smoking hx, DM 2, CAD). HP labs drawn fasting today.  Will complete his insurer's wellness program form when Tchol is back. Colon ca screening UTD: next colonoscopy due 2020. He gets regular DREs/PSAs via his urologist due to hx of mildly elevated PSA (neg bx in past), most recent 05/2014 per pt report. Will check HbA1c today for DM f/u. Will defer foot exam to next f/u in 48mo.   An After Visit Summary was printed and given to the patient.  FOLLOW UP:  Return in about 4 months (around 12/19/2014) for routine chronic illness f/u.

## 2014-08-21 ENCOUNTER — Encounter: Payer: Self-pay | Admitting: Family Medicine

## 2014-08-22 ENCOUNTER — Other Ambulatory Visit: Payer: Self-pay | Admitting: *Deleted

## 2014-08-22 MED ORDER — GLIPIZIDE ER 10 MG PO TB24: 10.0000 mg | ORAL_TABLET | Freq: Every day | ORAL | Status: DC

## 2014-09-01 ENCOUNTER — Other Ambulatory Visit: Payer: Self-pay | Admitting: Family Medicine

## 2014-09-17 ENCOUNTER — Encounter: Payer: Self-pay | Admitting: Family Medicine

## 2014-09-18 ENCOUNTER — Other Ambulatory Visit: Payer: Self-pay | Admitting: Family Medicine

## 2014-09-18 MED ORDER — GLIPIZIDE ER 10 MG PO TB24: 10.0000 mg | ORAL_TABLET | Freq: Every day | ORAL | Status: DC

## 2014-09-22 ENCOUNTER — Other Ambulatory Visit: Payer: Self-pay | Admitting: Family Medicine

## 2014-11-19 ENCOUNTER — Other Ambulatory Visit: Payer: Self-pay | Admitting: Family Medicine

## 2014-12-19 ENCOUNTER — Ambulatory Visit: Payer: BLUE CROSS/BLUE SHIELD | Admitting: Family Medicine

## 2014-12-22 NOTE — Progress Notes (Signed)
HPI: [name] is a pleasant gentleman who has a history of coronary disease status post drug-eluting stent to the circumflex in August 2005. His last Myoview was performed in May 2015. The patient's ejection fraction was 50%. There was no sign of scar or ischemia. Since I last saw him, the patient denies any dyspnea on exertion, orthopnea, PND, pedal edema, palpitations, syncope or chest pain.   Current Outpatient Prescriptions  Medication Sig Dispense Refill  . acetaminophen (TYLENOL) 500 MG tablet Take 1,000 mg by mouth every 6 (six) hours as needed for mild pain or headache.     Marland Kitchen aspirin 81 MG tablet Take 81 mg by mouth daily.     Marland Kitchen atenolol (TENORMIN) 50 MG tablet Take 1 tablet (50 mg total) by mouth daily with lunch. 90 tablet 1  . fluticasone (FLONASE) 50 MCG/ACT nasal spray Place 1 spray into both nostrils daily. 16 g 3  . glipiZIDE (GLUCOTROL XL) 10 MG 24 hr tablet Take 1 tablet (10 mg total) by mouth daily with breakfast. 90 tablet 1  . HYDROcodone-acetaminophen (NORCO/VICODIN) 5-325 MG per tablet Take 1-2 tablets by mouth every 6 (six) hours as needed. 25 tablet 0  . lisinopril (PRINIVIL,ZESTRIL) 20 MG tablet TAKE ONE TABLET BY MOUTH EVERY DAY WITH LUNCH 90 tablet 1  . metFORMIN (GLUCOPHAGE) 1000 MG tablet TAKE ONE TABLET BY MOUTH TWICE DAILY WITH A MEAL 180 tablet 1  . simvastatin (ZOCOR) 40 MG tablet Take 1 tablet (40 mg total) by mouth every evening. 90 tablet 1  . tamsulosin (FLOMAX) 0.4 MG CAPS capsule TAKE 1 CAPSULE BY MOUTH EVERY DAY AFTER SUPPER 90 capsule 1   No current facility-administered medications for this visit.     Past Medical History  Diagnosis Date  . History of bladder cancer 2012    Mass removed, plus gets ongoing BCG bladder infusions by urologist (Dr. Alinda Money).  Marland Kitchen CAD (coronary artery disease)      '05 MI > DES to circumflex  . HTN (hypertension)   . Hyperlipidemia   . Diabetes mellitus type 2, controlled     HbA1c 02/2011 was 6.6%  .  Wolf-Parkinson-White syndrome   . Tobacco abuse   . BPH (benign prostatic hyperplasia)     with hx of acute prostatitis  . Elevated PSA 09/2011    with prostate nodule.  Prostate bx ALL BENIGN 10/10/11 (Dr. Alinda Money)  . Myocardial infarction 2005  . GERD (gastroesophageal reflux disease)     occasional  . Headache(784.0)     cluster headaches a long time ago  . Arthritis   . Complication of anesthesia     "tried to fight when waking up"  . Thrombocytopenia     Stable 2012-2016    Past Surgical History  Procedure Laterality Date  . Bladder tumor excision  11/08/10    urothelial carcinoma (Dr. Alinda Money).  . Myocardial perfusion study  2009    Neg ischemia, EF 52%  . Lexiscan  2012    Normal/negative  . Lumbar spine surgery  [place] in [place] (Kritzer)-no trouble since last surgery  . Colonoscopy  2010    Dr. Isa Rankin, repeat 2020.  Patient wants to go to different GI when time for repeat.  . Vasectomy    . Cardiac catheterization  2005    with stent placement  . Transurethral resection of bladder tumor N/A 06/06/2013    Procedure: TRANSURETHRAL RESECTION OF BLADDER TUMOR (TURBT);  Surgeon: Romilda Joy  Alinda Money, MD;  Location: [facility];  Service: Urology;  Laterality: N/A;.  PATH: squamous metaplasia, fibrosis with chronic inflammation, no malignancy.  . Cystoscopy with retrograde pyelogram, ureteroscopy and stent placement N/A 06/06/2013    Procedure: CYSTOSCOPY WITH RETROGRADE PYELOGRAM;  Surgeon: Dutch Gray, MD;  Location: [facility];  Service: Urology;  Laterality: N/A;    History   Social History  . Marital Status: Married    Spouse Name: N/A  . Number of Children: N/A  . Years of Education: N/A   Occupational History  . Not on file.   Social History Main Topics  . Smoking status: Former Smoker -- 30 years    Types: Cigarettes    Quit date: 07/26/2011  . Smokeless tobacco: Never Used  . Alcohol Use: No  . Drug Use: No  . Sexual Activity: Not on file   Other  Topics Concern  . Not on file   Social History Narrative   Married, 2 children, 4 grandchildren--[place] the [place] farm now.   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   Tob: 30 pack-yr hx, quit 08/2011.   No alcohol or drug use.   Active on farm but no formal exercise regimen.    ROS: no fevers or chills, productive cough, hemoptysis, dysphasia, odynophagia, melena, hematochezia, dysuria, hematuria, rash, seizure activity, orthopnea, PND, pedal edema, claudication. Remaining systems are negative.  Physical Exam: Well-developed well-nourished in no acute distress.  Skin is warm and dry.  HEENT is normal.  Neck is supple.  Chest is clear to auscultation with normal expansion.  Cardiovascular exam is regular rate and rhythm.  Abdominal exam nontender or distended. No masses palpated. Extremities show no edema. neuro grossly intact  ECG sinus rhythm at a rate of 60. No ST changes.

## 2014-12-23 ENCOUNTER — Encounter: Payer: Self-pay | Admitting: Cardiology

## 2014-12-23 ENCOUNTER — Ambulatory Visit (INDEPENDENT_AMBULATORY_CARE_PROVIDER_SITE_OTHER): Payer: BLUE CROSS/BLUE SHIELD | Admitting: Cardiology

## 2014-12-23 VITALS — BP 110/76 | HR 60 | Ht 66.0 in | Wt 186.0 lb

## 2014-12-23 DIAGNOSIS — E7800 Pure hypercholesterolemia, unspecified: Secondary | ICD-10-CM

## 2014-12-23 DIAGNOSIS — I10 Essential (primary) hypertension: Secondary | ICD-10-CM | POA: Diagnosis not present

## 2014-12-23 DIAGNOSIS — I2510 Atherosclerotic heart disease of native coronary artery without angina pectoris: Secondary | ICD-10-CM

## 2014-12-23 NOTE — Assessment & Plan Note (Signed)
Continue aspirin and statin. 

## 2014-12-23 NOTE — Assessment & Plan Note (Signed)
Blood pressure controlled. Continue present medications. 

## 2014-12-23 NOTE — Patient Instructions (Signed)
Your physician wants you to follow-up in: ONE YEAR WITH DR CRENSHAW You will receive a reminder letter in the mail two months in advance. If you don't receive a letter, please call our office to schedule the follow-up appointment.  

## 2014-12-23 NOTE — Assessment & Plan Note (Signed)
Continue statin.lipids and liver checked recently by primary care.

## 2015-01-05 ENCOUNTER — Other Ambulatory Visit: Payer: Self-pay | Admitting: Family Medicine

## 2015-01-06 NOTE — Telephone Encounter (Signed)
LOV: 08/20/14, no up coming ov, last written: 07/14/14 #180 w/ 1RF.

## 2015-02-23 ENCOUNTER — Other Ambulatory Visit: Payer: Self-pay | Admitting: Family Medicine

## 2015-02-23 NOTE — Telephone Encounter (Signed)
RF request for tamsulosin LOV: 08/20/14 Next ov: 02/25/15 Last written: 09/01/14 #90 w/ 1RF

## 2015-02-25 ENCOUNTER — Encounter: Payer: Self-pay | Admitting: Family Medicine

## 2015-02-25 ENCOUNTER — Ambulatory Visit (INDEPENDENT_AMBULATORY_CARE_PROVIDER_SITE_OTHER): Payer: BLUE CROSS/BLUE SHIELD | Admitting: Family Medicine

## 2015-02-25 VITALS — BP 109/74 | HR 57 | Temp 97.7°F | Resp 16 | Ht 66.0 in | Wt 183.0 lb

## 2015-02-25 DIAGNOSIS — E119 Type 2 diabetes mellitus without complications: Secondary | ICD-10-CM | POA: Diagnosis not present

## 2015-02-25 DIAGNOSIS — I10 Essential (primary) hypertension: Secondary | ICD-10-CM

## 2015-02-25 DIAGNOSIS — E785 Hyperlipidemia, unspecified: Secondary | ICD-10-CM | POA: Diagnosis not present

## 2015-02-25 LAB — MICROALBUMIN / CREATININE URINE RATIO
Creatinine,U: 166.6 mg/dL
MICROALB UR: 7.1 mg/dL — AB (ref 0.0–1.9)
Microalb Creat Ratio: 4.3 mg/g (ref 0.0–30.0)

## 2015-02-25 LAB — HEMOGLOBIN A1C: Hgb A1c MFr Bld: 6.5 % (ref 4.6–6.5)

## 2015-02-25 NOTE — Progress Notes (Signed)
Pre visit review using our clinic review tool, if applicable. No additional management support is needed unless otherwise documented below in the visit note. 

## 2015-02-25 NOTE — Progress Notes (Signed)
OFFICE NOTE  02/25/2015  CC:  Chief Complaint  Patient presents with  . Follow-up    Pt is fasting.      HPI: Patient is a 59 y.o. Caucasian male who is here for 6 mo f/u DM 2, hypertension, and hyperlipidemia. Cardiology f/u about 2 mo ago: all stable, no changes in anything, no new testing.  FEET: no burning, tingling, or numbness except chronic R great toe numbness since last back surgery. Most recent eye exam was 07/2013.  No glucose monitoring.  Compliant with meds. No home bp monitoring.  Compliant with meds.  Pertinent PMH:  Past medical, surgical, social, and family history reviewed and no changes are noted since last office visit.  MEDS:  Outpatient Prescriptions Prior to Visit  Medication Sig Dispense Refill  . acetaminophen (TYLENOL) 500 MG tablet Take 1,000 mg by mouth every 6 (six) hours as needed for mild pain or headache.     Marland Kitchen aspirin 81 MG tablet Take 81 mg by mouth daily.     Marland Kitchen atenolol (TENORMIN) 50 MG tablet Take 1 tablet (50 mg total) by mouth daily with lunch. 90 tablet 1  . fluticasone (FLONASE) 50 MCG/ACT nasal spray Place 1 spray into both nostrils daily. 16 g 3  . glipiZIDE (GLUCOTROL XL) 10 MG 24 hr tablet Take 1 tablet (10 mg total) by mouth daily with breakfast. 90 tablet 1  . HYDROcodone-acetaminophen (NORCO/VICODIN) 5-325 MG per tablet Take 1-2 tablets by mouth every 6 (six) hours as needed. 25 tablet 0  . lisinopril (PRINIVIL,ZESTRIL) 20 MG tablet TAKE ONE TABLET BY MOUTH EVERY DAY WITH LUNCH 90 tablet 1  . metFORMIN (GLUCOPHAGE) 1000 MG tablet TAKE ONE TABLET BY MOUTH TWICE DAILY WITH A MEAL 180 tablet 0  . simvastatin (ZOCOR) 40 MG tablet Take 1 tablet (40 mg total) by mouth every evening. 90 tablet 1  . tamsulosin (FLOMAX) 0.4 MG CAPS capsule TAKE 1 CAPSULE BY MOUTH EVERY DAY AFTER SUPPER 90 capsule 0   No facility-administered medications prior to visit.    PE: Blood pressure 109/74, pulse 57, temperature 97.7 F (36.5 C), temperature  source Oral, resp. rate 16, height 5\' 6"  (1.676 m), weight 183 lb (83.008 kg), SpO2 97 %. Gen: Alert, well appearing.  Patient is oriented to person, place, time, and situation. Foot exam - bilateral normal; no swelling, tenderness or skin or vascular lesions. Color and temperature is normal. Sensation is intact. Peripheral pulses are palpable. Toenails are normal.  LABS: Lab Results  Component Value Date   HGBA1C 7.1* 08/20/2014   Lab Results  Component Value Date   TSH 1.34 08/20/2014   Lab Results  Component Value Date   WBC 6.8 08/20/2014   HGB 16.2 08/20/2014   HCT 46.4 08/20/2014   MCV 89.8 08/20/2014   PLT 118.0* 08/20/2014   Lab Results  Component Value Date   CREATININE 1.12 08/20/2014   BUN 14 08/20/2014   NA 138 08/20/2014   K 4.4 08/20/2014   CL 105 08/20/2014   CO2 29 08/20/2014   Lab Results  Component Value Date   ALT 19 08/20/2014   AST 23 08/20/2014   ALKPHOS 55 08/20/2014   BILITOT 0.6 08/20/2014   Lab Results  Component Value Date   CHOL 107 08/20/2014   Lab Results  Component Value Date   HDL 27.50* 08/20/2014   Lab Results  Component Value Date   LDLCALC 63 08/20/2014   Lab Results  Component Value Date   TRIG 82.0  08/20/2014   Lab Results  Component Value Date   CHOLHDL 4 08/20/2014   IMPRESSION AND PLAN:  1) DM 2, historically well controlled. Needs BMET and A1c, urine microalb/cr,  eye exam (he is checking into new eye MD).  He is fasting. Feet exam normal today.  2) HTN: The current medical regimen is effective;  continue present plan and medications.  3) Hyperlipidemia: tolerating statin.  FLP 6 mo ago good, AST/ALT good at that time as well. Repeat FLP 6 mo.  An After Visit Summary was printed and given to the patient.  FOLLOW UP: 3 mo

## 2015-02-26 LAB — BASIC METABOLIC PANEL WITH GFR
BUN: 17 mg/dL (ref 6–23)
CO2: 26 meq/L (ref 19–32)
Calcium: 9.9 mg/dL (ref 8.4–10.5)
Chloride: 103 meq/L (ref 96–112)
Creatinine, Ser: 1.13 mg/dL (ref 0.40–1.50)
GFR: 70.42 mL/min
Glucose, Bld: 145 mg/dL — ABNORMAL HIGH (ref 70–99)
Potassium: 4.6 meq/L (ref 3.5–5.1)
Sodium: 137 meq/L (ref 135–145)

## 2015-03-23 ENCOUNTER — Other Ambulatory Visit: Payer: Self-pay | Admitting: Family Medicine

## 2015-03-23 NOTE — Telephone Encounter (Signed)
LOV: 02/25/15 NOV: 05/28/15  RF request for lisinopril Last written: 09/22/14 #90 w/ 1RF  RF request for glipizide Last written: 09/18/14 #90 w/ 1RF

## 2015-04-07 ENCOUNTER — Other Ambulatory Visit: Payer: Self-pay | Admitting: Family Medicine

## 2015-04-07 NOTE — Telephone Encounter (Signed)
RF request for metformin LOV: 02/25/15 Next ov: 05/28/15 Last written: 01/06/15 #180 w/ 0RF

## 2015-05-11 ENCOUNTER — Ambulatory Visit (INDEPENDENT_AMBULATORY_CARE_PROVIDER_SITE_OTHER): Payer: BLUE CROSS/BLUE SHIELD

## 2015-05-11 DIAGNOSIS — Z23 Encounter for immunization: Secondary | ICD-10-CM

## 2015-05-19 ENCOUNTER — Other Ambulatory Visit: Payer: Self-pay | Admitting: Urology

## 2015-05-20 ENCOUNTER — Other Ambulatory Visit: Payer: Self-pay | Admitting: *Deleted

## 2015-05-20 MED ORDER — ATENOLOL 50 MG PO TABS: 50.0000 mg | ORAL_TABLET | Freq: Every day | ORAL | Status: DC

## 2015-05-20 NOTE — Telephone Encounter (Signed)
RF request for atenolol LOV: 02/25/15 Next ov: 05/28/15 Last written: 02/18/14 #90 w/ 1RF

## 2015-05-22 ENCOUNTER — Other Ambulatory Visit: Payer: Self-pay | Admitting: *Deleted

## 2015-05-22 MED ORDER — SIMVASTATIN 40 MG PO TABS: 40.0000 mg | ORAL_TABLET | Freq: Every evening | ORAL | Status: DC

## 2015-05-22 NOTE — Telephone Encounter (Signed)
RF request for simvastatin LOV: 02/25/15 Next ov: 05/28/15 Last written: 05/20/14 #90 w/ 1RF

## 2015-05-28 ENCOUNTER — Encounter: Payer: Self-pay | Admitting: Family Medicine

## 2015-05-28 ENCOUNTER — Ambulatory Visit (INDEPENDENT_AMBULATORY_CARE_PROVIDER_SITE_OTHER): Payer: BLUE CROSS/BLUE SHIELD | Admitting: Family Medicine

## 2015-05-28 VITALS — BP 107/72 | HR 75 | Temp 97.5°F | Resp 16 | Ht 66.0 in | Wt 190.0 lb

## 2015-05-28 DIAGNOSIS — E119 Type 2 diabetes mellitus without complications: Secondary | ICD-10-CM

## 2015-05-28 LAB — POCT GLYCOSYLATED HEMOGLOBIN (HGB A1C): Hemoglobin A1C: 6.6

## 2015-05-28 MED ORDER — TAMSULOSIN HCL 0.4 MG PO CAPS: ORAL_CAPSULE | ORAL | Status: DC

## 2015-05-28 NOTE — Progress Notes (Signed)
Pre visit review using our clinic review tool, if applicable. No additional management support is needed unless otherwise documented below in the visit note. 

## 2015-05-28 NOTE — Progress Notes (Signed)
OFFICE VISIT  05/28/2015   CC:  Chief Complaint  Patient presents with  . Follow-up    DM, HTN and HCL. Pt is fasting.    HPI:    Patient is a 59 y.o. Caucasian male who presents for 3 mo f/u DM 2, HTN, hyperlipidemia. Says he has not had time to monitor glucoses or BP's.  Says he has felt well.  Tries to eat a diabetic diet. 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 Taking all chronic meds as prescribed, no adverse side effects.  He is going to get a biopsy of a place in his bladder by Dr. Alinda Money at the end of this month.  Past Medical History  Diagnosis Date  . History of bladder cancer 2012    Mass removed, plus gets ongoing BCG bladder infusions by urologist (Dr. Alinda Money).  Marland Kitchen CAD (coronary artery disease)      '05 MI > DES to circumflex  . HTN (hypertension)   . Hyperlipidemia   . Diabetes mellitus type 2, controlled ([place])     HbA1c 02/2011 was 6.6%  . Wolf-Parkinson-White syndrome   . Tobacco abuse     Quit  . BPH (benign prostatic hyperplasia)     with hx of acute prostatitis  . Elevated PSA 09/2011    with prostate nodule.  Prostate bx ALL BENIGN 10/10/11 (Dr. Alinda Money)  . Myocardial infarction [facility]) 2005    followed by Dr. Stanford Breed  . GERD (gastroesophageal reflux disease)     occasional  . Headache(784.0)     cluster headaches a long time ago  . Arthritis   . Thrombocytopenia ([place])     Stable 2012-2016    Past Surgical History  Procedure Laterality Date  . Bladder tumor excision  11/08/10    urothelial carcinoma (Dr. Alinda Money).  . Myocardial perfusion study  2009    Neg ischemia, EF 52%  . Lexiscan  2012    Normal/negative  . Lumbar spine surgery  [place] in [place] ([place])-no trouble since last surgery  . Colonoscopy  2010    Dr. Isa Rankin, repeat 2020.  Patient wants to go to different GI when time for repeat.  . Vasectomy    . Cardiac catheterization  2005    with stent placement  . Transurethral resection of  bladder tumor N/A 06/06/2013    Procedure: TRANSURETHRAL RESECTION OF BLADDER TUMOR (TURBT);  Surgeon: Dutch Gray, MD;  Location: [facility];  Service: Urology;  Laterality: N/A;.  PATH: squamous metaplasia, fibrosis with chronic inflammation, no malignancy.  . Cystoscopy with retrograde pyelogram, ureteroscopy and stent placement N/A 06/06/2013    Procedure: CYSTOSCOPY WITH RETROGRADE PYELOGRAM;  Surgeon: Dutch Gray, MD;  Location: [facility];  Service: Urology;  Laterality: N/A;    Outpatient Prescriptions Prior to Visit  Medication Sig Dispense Refill  . acetaminophen (TYLENOL) 500 MG tablet Take 1,000 mg by mouth every 6 (six) hours as needed for mild pain or headache.     Marland Kitchen aspirin 81 MG tablet Take 81 mg by mouth daily.     Marland Kitchen atenolol (TENORMIN) 50 MG tablet Take 1 tablet (50 mg total) by mouth daily with lunch. 90 tablet 3  . fluticasone (FLONASE) 50 MCG/ACT nasal spray Place 1 spray into both nostrils daily. 16 g 3  . glipiZIDE (GLUCOTROL XL) 10 MG 24 hr tablet TAKE ONE TABLET BY MOUTH EVERY DAY WITH BREAKFAST 90 tablet 1  . HYDROcodone-acetaminophen (NORCO/VICODIN) 5-325 MG per tablet Take  1-2 tablets by mouth every 6 (six) hours as needed. 25 tablet 0  . lisinopril (PRINIVIL,ZESTRIL) 20 MG tablet TAKE ONE TABLET BY MOUTH EVERY DAY WITH LUNCH 90 tablet 1  . metFORMIN (GLUCOPHAGE) 1000 MG tablet TAKE ONE TABLET BY MOUTH TWICE DAILY WITH A MEAL (NEED OFFICE VISIT FOR MORE REFILLS) 180 tablet 1  . simvastatin (ZOCOR) 40 MG tablet Take 1 tablet (40 mg total) by mouth every evening. 90 tablet 3  . tamsulosin (FLOMAX) 0.4 MG CAPS capsule TAKE 1 CAPSULE BY MOUTH EVERY DAY AFTER SUPPER 90 capsule 0   No facility-administered medications prior to visit.    No Known Allergies  ROS As per HPI  PE: Blood pressure 107/72, pulse 75, temperature 97.5 F (36.4 C), temperature source Oral, resp. rate 16, height 5\' 6"  (1.676 m), weight 190 lb (86.183 kg), SpO2 97 %. Gen: Alert, well appearing.  Patient  is oriented to person, place, time, and situation. No further exam today.  LABS:  Lab Results  Component Value Date   TSH 1.34 08/20/2014   Lab Results  Component Value Date   WBC 6.8 08/20/2014   HGB 16.2 08/20/2014   HCT 46.4 08/20/2014   MCV 89.8 08/20/2014   PLT 118.0* 08/20/2014   Lab Results  Component Value Date   CREATININE 1.13 02/25/2015   BUN 17 02/25/2015   NA 137 02/25/2015   K 4.6 02/25/2015   CL 103 02/25/2015   CO2 26 02/25/2015   Lab Results  Component Value Date   ALT 19 08/20/2014   AST 23 08/20/2014   ALKPHOS 55 08/20/2014   BILITOT 0.6 08/20/2014   Lab Results  Component Value Date   CHOL 107 08/20/2014   Lab Results  Component Value Date   HDL 27.50* 08/20/2014   Lab Results  Component Value Date   LDLCALC 63 08/20/2014   Lab Results  Component Value Date   TRIG 82.0 08/20/2014   Lab Results  Component Value Date   CHOLHDL 4 08/20/2014   Lab Results  Component Value Date   HGBA1C 6.6 05/28/2015    IMPRESSION AND PLAN:  1) DM 2, no home glucose testing being done. POC HbA1c today: 6.6%--same as last check. The current medical regimen is effective;  continue present plan and medications.   2) HTN; The current medical regimen is effective;  continue present plan and medications. Recheck lytes/cr at next f/u in 3 mo.  3) Hyperlipidemia: The current medical regimen is effective;  continue present plan and medications. Repeat FLP and AST/ALT in 3 mo.  An After Visit Summary was printed and given to the patient.  Plan repeat FLP, CMET, CBC, HbA1c, and TSH at next visit in 3 mo for his annual CPE.  FOLLOW UP: Return in about 3 months (around 08/28/2015) for annual CPE (fasting).

## 2015-06-16 ENCOUNTER — Encounter (HOSPITAL_COMMUNITY): Payer: Self-pay

## 2015-06-16 ENCOUNTER — Encounter (HOSPITAL_COMMUNITY)
Admission: RE | Admit: 2015-06-16 | Discharge: 2015-06-16 | Disposition: A | Discharge status: Home / Self Care | Payer: BLUE CROSS/BLUE SHIELD | Source: Ambulatory Visit | Attending: Urology | Admitting: Urology

## 2015-06-16 ENCOUNTER — Ambulatory Visit (HOSPITAL_COMMUNITY)
Admission: RE | Admit: 2015-06-16 | Discharge: 2015-06-16 | Disposition: A | Discharge status: Home / Self Care | Payer: BLUE CROSS/BLUE SHIELD | Source: Ambulatory Visit | Attending: Anesthesiology | Admitting: Anesthesiology

## 2015-06-16 DIAGNOSIS — Z01812 Encounter for preprocedural laboratory examination: Secondary | ICD-10-CM | POA: Diagnosis not present

## 2015-06-16 DIAGNOSIS — Z01810 Encounter for preprocedural cardiovascular examination: Secondary | ICD-10-CM | POA: Diagnosis not present

## 2015-06-16 HISTORY — DX: Urinary tract infection, site not specified: N39.0

## 2015-06-16 HISTORY — DX: Personal history of other specified conditions: Z87.898

## 2015-06-16 HISTORY — DX: Personal history of other diseases of the respiratory system: Z87.09

## 2015-06-16 LAB — CBC
HEMATOCRIT: 46.4 % (ref 39.0–52.0)
Hemoglobin: 16.4 g/dL (ref 13.0–17.0)
MCH: 31.1 pg (ref 26.0–34.0)
MCHC: 35.3 g/dL (ref 30.0–36.0)
MCV: 88 fL (ref 78.0–100.0)
Platelets: 126 10*3/uL — ABNORMAL LOW (ref 150–400)
RBC: 5.27 MIL/uL (ref 4.22–5.81)
RDW: 13.4 % (ref 11.5–15.5)
WBC: 8.2 10*3/uL (ref 4.0–10.5)

## 2015-06-16 LAB — BASIC METABOLIC PANEL
Anion gap: 9 (ref 5–15)
BUN: 15 mg/dL (ref 6–20)
CO2: 25 mmol/L (ref 22–32)
Calcium: 9.4 mg/dL (ref 8.9–10.3)
Chloride: 104 mmol/L (ref 101–111)
Creatinine, Ser: 1.19 mg/dL (ref 0.61–1.24)
GFR calc Af Amer: >60 mL/min (ref >60)
GLUCOSE: 116 mg/dL — AB (ref 65–99)
POTASSIUM: 4.3 mmol/L (ref 3.5–5.1)
Sodium: 138 mmol/L (ref 135–145)

## 2015-06-16 NOTE — Patient Instructions (Signed)
Mark Blankenship  06/16/2015   Your procedure is scheduled on: Monday June 22, 2015   Report to [facility] Main  Entrance take [place]  elevators to 3rd floor to  [place] at 2:45 PM.  Call this number if you have problems the morning of surgery [phone]   Remember: ONLY 1 PERSON MAY GO WITH YOU TO SHORT STAY TO GET  READY MORNING OF Mark Blankenship.  Do not eat food After Midnight but may take clear liquids till 11:15 am day of surgery then nothing by mouth.      Take these medicines the morning of surgery with A SIP OF WATER: Atenolol; May use flonase if needed DO NOT TAKE ANY DIABETIC MEDICATIONS DAY OF YOUR SURGERY                               You may not have any metal on your body including hair pins and              piercings  Do not wear jewelry, lotions, powders or colognes, deodorant                           Men may shave face and neck.   Do not bring valuables to the hospital. [place].  Contacts, dentures or bridgework may not be worn into surgery.  Leave suitcase in the car. After surgery it may be brought to your room.    Patients discharged the day of surgery will not be allowed to drive home.  Name and phone number of your driver:Mark Blankenship (wife)   Special Instructions: OKAY TO CONTINUE ASPIRIN FOR PROCEDURE PER DR CRENSHAW             _____________________________________________________________________             [facility] - Preparing for Surgery Before surgery, you can play an important role.  Because skin is not sterile, your skin needs to be as free of germs as possible.  You can reduce the number of germs on your skin by washing with CHG (chlorahexidine gluconate) soap before surgery.  CHG is an antiseptic cleaner which kills germs and bonds with the skin to continue killing germs even after washing. Please DO NOT use if you have an allergy to CHG or  antibacterial soaps.  If your skin becomes reddened/irritated stop using the CHG and inform your nurse when you arrive at Short Stay. Do not shave (including legs and underarms) for at least 48 hours prior to the first CHG shower.  You may shave your face/neck. Please follow these instructions carefully:  1.  Shower with CHG Soap the night before surgery and the  morning of Surgery.  2.  If you choose to wash your hair, wash your hair first as usual with your  normal  shampoo.  3.  After you shampoo, rinse your hair and body thoroughly to remove the  shampoo.                           4.  Use CHG as you would any other liquid soap.  You can apply chg directly  to  the skin and wash                       Gently with a scrungie or clean washcloth.  5.  Apply the CHG Soap to your body ONLY FROM THE NECK DOWN.   Do not use on face/ open                           Wound or open sores. Avoid contact with eyes, ears mouth and genitals (private parts).                       Wash face,  Genitals (private parts) with your normal soap.             6.  Wash thoroughly, paying special attention to the area where your surgery  will be performed.  7.  Thoroughly rinse your body with warm water from the neck down.  8.  DO NOT shower/wash with your normal soap after using and rinsing off  the CHG Soap.                9.  Pat yourself dry with a clean towel.            10.  Wear clean pajamas.            11.  Place clean sheets on your bed the night of your first shower and do not  sleep with pets. Day of Surgery : Do not apply any lotions/deodorants the morning of surgery.  Please wear clean clothes to the hospital/surgery center.  FAILURE TO FOLLOW THESE INSTRUCTIONS MAY RESULT IN THE CANCELLATION OF YOUR SURGERY PATIENT SIGNATURE_________________________________  NURSE SIGNATURE__________________________________  ________________________________________________________________________    CLEAR LIQUID  DIET   Foods Allowed                                                                     Foods Excluded  Coffee and tea, regular and decaf                             liquids that you cannot  Plain Jell-O in any flavor                                             see through such as: Fruit ices (not with fruit pulp)                                     milk, soups, orange juice  Iced Popsicles                                    All solid food Carbonated beverages, regular and diet  Cranberry, grape and apple juices Sports drinks like Gatorade Lightly seasoned clear broth or consume(fat free) Sugar, honey syrup  Sample Menu Breakfast                                Lunch                                     Supper Cranberry juice                    Beef broth                            Chicken broth Jell-O                                     Grape juice                           Apple juice Coffee or tea                        Jell-O                                      Popsicle                                                Coffee or tea                        Coffee or tea  _____________________________________________________________________

## 2015-06-16 NOTE — Progress Notes (Signed)
CBC results in epic per PAT visit 06/16/2015 sent to Dr Alinda Money

## 2015-06-16 NOTE — Progress Notes (Signed)
LOV note per Dr McGowen/epic 05/28/2015 A1C results discussed per 05/28/2015 LOV note per Dr Crenshaw/cardiology / epic 12/23/2014 EKG/epic 12/23/2014 Stress test epic 11/27/2013

## 2015-06-17 ENCOUNTER — Ambulatory Visit (INDEPENDENT_AMBULATORY_CARE_PROVIDER_SITE_OTHER): Payer: BLUE CROSS/BLUE SHIELD | Admitting: Family Medicine

## 2015-06-17 ENCOUNTER — Encounter: Payer: Self-pay | Admitting: Family Medicine

## 2015-06-17 VITALS — BP 155/80 | HR 62 | Temp 97.8°F | Resp 20 | Wt 195.2 lb

## 2015-06-17 DIAGNOSIS — H6092 Unspecified otitis externa, left ear: Secondary | ICD-10-CM | POA: Diagnosis not present

## 2015-06-17 DIAGNOSIS — H6122 Impacted cerumen, left ear: Secondary | ICD-10-CM

## 2015-06-17 DIAGNOSIS — H918X2 Other specified hearing loss, left ear: Secondary | ICD-10-CM

## 2015-06-17 DIAGNOSIS — H6090 Unspecified otitis externa, unspecified ear: Secondary | ICD-10-CM | POA: Insufficient documentation

## 2015-06-17 MED ORDER — CIPROFLOXACIN-DEXAMETHASONE 0.3-0.1 % OT SUSP: 4.0000 [drp] | Freq: Two times a day (BID) | OTIC | Status: DC

## 2015-06-17 NOTE — Patient Instructions (Signed)

## 2015-06-17 NOTE — Progress Notes (Signed)
   Subjective:    Patient ID: Mark Blankenship, male    DOB: 10/07/1955, 59 y.o.   MRN: [id number]  HPI  Left Ear pain: Pt reports he woke up this morning with left ear pain. He has had ear pain off and on in the past and it has been secondary to wax build up. He tried OTC wax removal and the fluid would not go down in his ear. It states that he is going in for a bladder bx surgery Monday and he wants this  cleared up before then. He denies fever, chills, hearing loss, ringing in the ears. He is Eating and drinking ok. He does endorse increase ear pain and drainage in the left ear only.  Everyday smoker  Past Medical History  Diagnosis Date  . History of bladder cancer 2012    Mass removed, plus gets ongoing BCG bladder infusions by urologist (Dr. Alinda Money).  Marland Kitchen CAD (coronary artery disease)      '05 MI > DES to circumflex  . HTN (hypertension)   . Hyperlipidemia   . Diabetes mellitus type 2, controlled ([place])     HbA1c 02/2011 was 6.6%  . Wolf-Parkinson-White syndrome   . Tobacco abuse     Quit  . BPH (benign prostatic hyperplasia)     with hx of acute prostatitis  . Elevated PSA 09/2011    with prostate nodule.  Prostate bx ALL BENIGN 10/10/11 (Dr. Alinda Money)  . Myocardial infarction [facility]) 2005    followed by Dr. Stanford Breed  . GERD (gastroesophageal reflux disease)     occasional  . Headache(784.0)     cluster headaches a long time ago  . Arthritis   . Thrombocytopenia ([place])     Stable 2012-2016  . Complication of anesthesia     agitation   . History of bronchitis   . History of vertigo   . Urinary tract infection    No Known Allergies   Review of Systems Negative, with the exception of above mentioned in HPI     Objective:   Physical Exam BP 155/80 mmHg  Pulse 62  Temp(Src) 97.8 F (36.6 C) (Oral)  Resp 20  Wt 195 lb 4 oz (88.565 kg)  SpO2 93% Gen: Afebrile. No acute distress. Nontoxic in appearance.Well developed, well nourished male.  HENT: AT. [place]. Bilateral TM  visualized, right TM without erythema or bulging, Rt EAC normal. Left TM unble to be visualized, EAC swelling, white exudates and cerumen present.. MMM. Bilateral nares without erythema or swelling. Throat without erythema or exudates.  Eyes:Pupils Equal Round Reactive to light, Extraocular movements intact,  Conjunctiva without redness, discharge or icterus. Neck/lymp/endocrine: Supple, mild left ant cervical  CV: RRR     Assessment & Plan:  1. Hearing loss of left ear due to cerumen impaction - With left cerumen impaction, cleared after lavage. - Hearing return to normal.  2. External otitis, left - Inspection after lavage consistent with left externa otitis. Patient prescribed Ciprodex.  -Follow-up in 2 weeks if no improvement - ciprofloxacin-dexamethasone (CIPRODEX) otic suspension; Place 4 drops into the left ear 2 (two) times daily.  Dispense: 7.5 mL; Refill: 0

## 2015-06-17 NOTE — H&P (Signed)
History of Present Illness Mark Blankenship is a 59 year old with the following urologic history:    1) BPH/LUTS: Mark Blankenship presented in November 2010 with symptoms including dysuria, new unconscious incontinence, frequency, urgency, and nocturia. Mark Blankenship was felt to most likely have acute prostatitis and was treated with ciprofloxacin and tamsulosin. His symptoms mostly resolved after antibiotic treatment except for mild nocturia and frequency improved on alpha blocker therapy.  Current treatment: Tamsulosin 0.4 mg    2) Urothelial carcinoma of the bladder: Mark Blankenship presented with voiding symptoms and microscopic hematuria in January 2012 and was found to have a bladder tumor on cystoscopy. TURBT was performed in March 2012 (delayed due to acute chest pain and cardiac evaluation) revealed high grade T1 urothelial carcinoma with concomitant CIS of the bladder. Repeat staging TUR in April 2012 did not demonstrate further disease.    Mar 2012: TUR - High grade, T1  Apr 2012: Restaging TUR - no residual tumor  May-June 2012: Induction BCG  Sept-Oct 2012: Maintenance BCG  Dec 2012-Jan 2013: Maintenance BCG  Jun-Jul 2013: Maintenance BCG  Nov 2014: Biopsy of bladder - squamous metaplasia (no malignancy)    3) Elevated PSA/ prostate nodule: His PSA increased to 4.6 in March 2013 and Mark Blankenship was found to have a new right mid prostate nodule prompting a prostate biopsy which was benign.  Family history: Positive (father)    Mar 2013 (12 core): Benign, Vol 31 cc    4) Erectile dysfunction: Mark Blankenship initially complained of erectile dysfunction in 2014. His risk factors include a history of diabetes and smoking.  Current treatment: Trial of Stendra    Interval history:   Mark Blankenship was recently noted to have a recurrent lesion in the bladder during office cystoscopy.     Past Medical History Problems  1. History of Acute Myocardial Infarction 2. History of Arthritis 3. History of diabetes mellitus  (Z86.39) 4. History of hypercholesterolemia (Z86.39) 5. History of hypertension (Z86.79)  Surgical History Problems  1. History of Back Surgery 2. History of Cath Stent 1 Type Drug-Eluting 3. History of Cystoscopy With Fulguration Large Lesion (Over 5cm) 4. History of Cystoscopy With Fulguration Medium Lesion (2-5cm) 5. History of Cystoscopy With Fulguration Small Lesion (5-92mm) 6. History of Surgery Vas Deferens Vasectomy  Current Meds 1. Adult Aspirin Low Strength 81 MG TBDP;  Therapy: (Recorded:12Nov2010) to Recorded 2. Atenolol TABS;  Therapy: (Recorded:12Nov2010) to Recorded 3. Fluticasone Propionate 50 MCG/ACT Nasal Suspension;  Therapy: [id number] to Recorded 4. GlipiZIDE XL 5 MG Oral Tablet Extended Release 24 Hour;  Therapy: [id number] to Recorded 5. Lisinopril TABS;  Therapy: (Recorded:12Nov2010) to Recorded 6. MetFORMIN HCl - 1000 MG Oral Tablet;  Therapy: (Recorded:05Jun2013) to Recorded 7. Phenazopyridine HCl - 200 MG Oral Tablet; TAKE 1 TABLET Every 8 hours PRN;  Therapy: [id number] to (Last Rx:02Dec2014) Ordered 8. Simvastatin 10 MG Oral Tablet;  Therapy: (Recorded:28Mar2012) to Recorded 9. Tamsulosin HCl - 0.4 MG Oral Capsule; TAKE ONE CAPSULE BY MOUTH EVERY DAY;  Therapy: [id number] to (Evaluate:01May2014)  Requested for: [id number]; Last  [id number] Ordered  Allergies Medication  1. No Known Drug Allergies  Family History Problems  1. Family history of Diabetes Mellitus : Mother 2. Family history of Lung Cancer : Father 3. Family history of Prostate Cancer : Father  Social History Problems  1. Denied: History of Alcohol Use (History) 2. Current every day smoker (F17.200) 3. Marital History - Currently Married 4. Occupation:   farmer 5. Tobacco Use   1/2 ppdx 30 years  Review  of Systems  Genitourinary: no hematuria.  Constitutional: no night sweats and no recent weight loss.  Hematologic/Lymphatic: no tendency to easily bruise and no swollen  glands.  Cardiovascular: no leg swelling.     Height: 5 ft 7 in Weight: 190 lb  BMI Calculated: 29.76 BSA Calculated: 1.98  Physical Exam Constitutional: Well nourished and well developed . No acute distress.  ENT:. The ears and nose are normal in appearance.  Neck: The appearance of the neck is normal and no neck mass is present.  Pulmonary: No respiratory distress, normal respiratory rhythm and effort and clear bilateral breath sounds.  Cardiovascular: Heart rate and rhythm are normal . No peripheral edema.  Abdomen: The abdomen is soft and nontender. No masses are palpated. No CVA tenderness. No hernias are palpable. No hepatosplenomegaly noted.   Assessment Assessed  1. Malignant neoplasm of overlapping sites of bladder (C67.8)    Discussion/Summary 1. High-risk non-muscle invasive bladder cancer:  I recommended proceeding with cystoscopy and biopsy/transurethral resection for further evaluation. Considering the fact that Mark Blankenship has not undergone any recent upper tract imaging, we will also perform bilateral retrograde pyelography at that time.  Mark Blankenship has been instructed on potential risks/side effects/complications. We also discussed the expected recovery process.

## 2015-06-22 ENCOUNTER — Ambulatory Visit (HOSPITAL_COMMUNITY): Payer: BLUE CROSS/BLUE SHIELD | Admitting: Registered Nurse

## 2015-06-22 ENCOUNTER — Ambulatory Visit (HOSPITAL_COMMUNITY)
Admission: RE | Admit: 2015-06-22 | Discharge: 2015-06-22 | Disposition: A | Discharge status: Home / Self Care | Payer: BLUE CROSS/BLUE SHIELD | Source: Ambulatory Visit | Attending: Urology | Admitting: Urology

## 2015-06-22 ENCOUNTER — Encounter (HOSPITAL_COMMUNITY)
Admission: RE | Disposition: A | Discharge status: Home / Self Care | Payer: Self-pay | Source: Ambulatory Visit | Attending: Urology

## 2015-06-22 ENCOUNTER — Encounter (HOSPITAL_COMMUNITY): Payer: Self-pay | Admitting: *Deleted

## 2015-06-22 DIAGNOSIS — N401 Enlarged prostate with lower urinary tract symptoms: Secondary | ICD-10-CM | POA: Diagnosis not present

## 2015-06-22 DIAGNOSIS — E785 Hyperlipidemia, unspecified: Secondary | ICD-10-CM | POA: Diagnosis not present

## 2015-06-22 DIAGNOSIS — D090 Carcinoma in situ of bladder: Secondary | ICD-10-CM | POA: Diagnosis not present

## 2015-06-22 DIAGNOSIS — I2510 Atherosclerotic heart disease of native coronary artery without angina pectoris: Secondary | ICD-10-CM | POA: Insufficient documentation

## 2015-06-22 DIAGNOSIS — I252 Old myocardial infarction: Secondary | ICD-10-CM | POA: Insufficient documentation

## 2015-06-22 DIAGNOSIS — Z6829 Body mass index (BMI) 29.0-29.9, adult: Secondary | ICD-10-CM | POA: Insufficient documentation

## 2015-06-22 DIAGNOSIS — N403 Nodular prostate with lower urinary tract symptoms: Secondary | ICD-10-CM | POA: Insufficient documentation

## 2015-06-22 DIAGNOSIS — Z7984 Long term (current) use of oral hypoglycemic drugs: Secondary | ICD-10-CM | POA: Insufficient documentation

## 2015-06-22 DIAGNOSIS — R972 Elevated prostate specific antigen [PSA]: Secondary | ICD-10-CM | POA: Insufficient documentation

## 2015-06-22 DIAGNOSIS — Z79899 Other long term (current) drug therapy: Secondary | ICD-10-CM | POA: Diagnosis not present

## 2015-06-22 DIAGNOSIS — D696 Thrombocytopenia, unspecified: Secondary | ICD-10-CM | POA: Diagnosis not present

## 2015-06-22 DIAGNOSIS — Z7982 Long term (current) use of aspirin: Secondary | ICD-10-CM | POA: Diagnosis not present

## 2015-06-22 DIAGNOSIS — M1990 Unspecified osteoarthritis, unspecified site: Secondary | ICD-10-CM | POA: Diagnosis not present

## 2015-06-22 DIAGNOSIS — I456 Pre-excitation syndrome: Secondary | ICD-10-CM | POA: Insufficient documentation

## 2015-06-22 DIAGNOSIS — E7800 Pure hypercholesterolemia, unspecified: Secondary | ICD-10-CM | POA: Insufficient documentation

## 2015-06-22 DIAGNOSIS — K219 Gastro-esophageal reflux disease without esophagitis: Secondary | ICD-10-CM | POA: Diagnosis not present

## 2015-06-22 DIAGNOSIS — E669 Obesity, unspecified: Secondary | ICD-10-CM | POA: Insufficient documentation

## 2015-06-22 DIAGNOSIS — I10 Essential (primary) hypertension: Secondary | ICD-10-CM | POA: Diagnosis not present

## 2015-06-22 DIAGNOSIS — E119 Type 2 diabetes mellitus without complications: Secondary | ICD-10-CM | POA: Insufficient documentation

## 2015-06-22 DIAGNOSIS — C678 Malignant neoplasm of overlapping sites of bladder: Secondary | ICD-10-CM | POA: Diagnosis present

## 2015-06-22 DIAGNOSIS — N529 Male erectile dysfunction, unspecified: Secondary | ICD-10-CM | POA: Insufficient documentation

## 2015-06-22 HISTORY — PX: CYSTOSCOPY W/ RETROGRADES: SHX1426

## 2015-06-22 HISTORY — PX: TRANSURETHRAL RESECTION OF BLADDER TUMOR: SHX2575

## 2015-06-22 LAB — GLUCOSE, CAPILLARY: Glucose-Capillary: 135 mg/dL — ABNORMAL HIGH (ref 65–99)

## 2015-06-22 SURGERY — CYSTOSCOPY, WITH RETROGRADE PYELOGRAM
Anesthesia: General

## 2015-06-22 MED ORDER — PHENAZOPYRIDINE HCL 200 MG PO TABS: 200.0000 mg | ORAL_TABLET | Freq: Three times a day (TID) | ORAL | Status: DC | PRN

## 2015-06-22 MED ORDER — IOHEXOL 300 MG/ML  SOLN
INTRAMUSCULAR | Status: DC | PRN
  Administered 2015-06-22: 30 mL

## 2015-06-22 MED ORDER — CIPROFLOXACIN IN D5W 400 MG/200ML IV SOLN
400.0000 mg | INTRAVENOUS | Status: AC
  Administered 2015-06-22: 400 mg via INTRAVENOUS

## 2015-06-22 MED ORDER — FENTANYL CITRATE (PF) 100 MCG/2ML IJ SOLN
INTRAMUSCULAR | Status: DC | PRN
  Administered 2015-06-22: 100 ug via INTRAVENOUS

## 2015-06-22 MED ORDER — PROMETHAZINE HCL 25 MG/ML IJ SOLN: 6.2500 mg | INTRAMUSCULAR | Status: DC | PRN

## 2015-06-22 MED ORDER — LACTATED RINGERS IV SOLN
INTRAVENOUS | Status: DC | PRN
  Administered 2015-06-22 (×2): via INTRAVENOUS

## 2015-06-22 MED ORDER — PROPOFOL 10 MG/ML IV BOLUS
INTRAVENOUS | Status: DC | PRN
  Administered 2015-06-22: 200 mg via INTRAVENOUS

## 2015-06-22 MED ORDER — LACTATED RINGERS IV SOLN
INTRAVENOUS | Status: DC
  Administered 2015-06-22: 1000 mL via INTRAVENOUS

## 2015-06-22 MED ORDER — CIPROFLOXACIN IN D5W 400 MG/200ML IV SOLN
INTRAVENOUS | Status: AC
Start: 2015-06-22 — End: 2015-06-22
  Filled 2015-06-22: qty 200

## 2015-06-22 MED ORDER — DEXAMETHASONE SODIUM PHOSPHATE 10 MG/ML IJ SOLN
INTRAMUSCULAR | Status: AC
  Filled 2015-06-22: qty 1

## 2015-06-22 MED ORDER — HYDROCODONE-ACETAMINOPHEN 5-325 MG PO TABS: 1.0000 | ORAL_TABLET | Freq: Four times a day (QID) | ORAL | Status: DC | PRN

## 2015-06-22 MED ORDER — MIDAZOLAM HCL 2 MG/2ML IJ SOLN
INTRAMUSCULAR | Status: AC
  Filled 2015-06-22: qty 2

## 2015-06-22 MED ORDER — FENTANYL CITRATE (PF) 100 MCG/2ML IJ SOLN
INTRAMUSCULAR | Status: AC
  Filled 2015-06-22: qty 2

## 2015-06-22 MED ORDER — STERILE WATER FOR IRRIGATION IR SOLN
Status: DC | PRN
  Administered 2015-06-22: 3000 mL

## 2015-06-22 MED ORDER — PROPOFOL 10 MG/ML IV BOLUS
INTRAVENOUS | Status: AC
  Filled 2015-06-22: qty 20

## 2015-06-22 MED ORDER — SODIUM CHLORIDE 0.9 % IR SOLN
Status: DC | PRN
  Administered 2015-06-22: 3000 mL

## 2015-06-22 MED ORDER — LIDOCAINE HCL (CARDIAC) 20 MG/ML IV SOLN
INTRAVENOUS | Status: AC
  Filled 2015-06-22: qty 10

## 2015-06-22 MED ORDER — ONDANSETRON HCL 4 MG/2ML IJ SOLN
INTRAMUSCULAR | Status: AC
  Filled 2015-06-22: qty 2

## 2015-06-22 MED ORDER — PHENYLEPHRINE HCL 10 MG/ML IJ SOLN
INTRAMUSCULAR | Status: DC | PRN
  Administered 2015-06-22 (×2): 80 ug via INTRAVENOUS

## 2015-06-22 MED ORDER — ONDANSETRON HCL 4 MG/2ML IJ SOLN
INTRAMUSCULAR | Status: DC | PRN
  Administered 2015-06-22: 4 mg via INTRAVENOUS

## 2015-06-22 MED ORDER — FENTANYL CITRATE (PF) 100 MCG/2ML IJ SOLN: 25.0000 ug | INTRAMUSCULAR | Status: DC | PRN

## 2015-06-22 MED ORDER — LIDOCAINE HCL (CARDIAC) 10 MG/ML IV SOLN
INTRAVENOUS | Status: DC | PRN
Start: 2015-06-22 — End: 2015-06-22
  Administered 2015-06-22: 100 mg via INTRAVENOUS

## 2015-06-22 SURGICAL SUPPLY — 15 items
BAG URINE DRAINAGE (UROLOGICAL SUPPLIES) IMPLANT
BAG URO CATCHER STRL LF (DRAPE) ×3 IMPLANT
CATH URET 5FR 28IN CONE TIP (BALLOONS) ×1
CATH URET 5FR 70CM CONE TIP (BALLOONS) ×2 IMPLANT
ELECT REM PT RETURN 9FT ADLT (ELECTROSURGICAL) ×3
ELECTRODE REM PT RTRN 9FT ADLT (ELECTROSURGICAL) ×2 IMPLANT
EVACUATOR MICROVAS BLADDER (UROLOGICAL SUPPLIES) IMPLANT
GLOVE BIOGEL M STRL SZ7.5 (GLOVE) ×3 IMPLANT
GOWN STRL REUS W/TWL LRG LVL3 (GOWN DISPOSABLE) ×3 IMPLANT
KIT ASPIRATION TUBING (SET/KITS/TRAYS/PACK) IMPLANT
LOOP CUT BIPOLAR 24F LRG (ELECTROSURGICAL) IMPLANT
MANIFOLD NEPTUNE II (INSTRUMENTS) ×3 IMPLANT
PACK CYSTO (CUSTOM PROCEDURE TRAY) ×3 IMPLANT
SYRINGE IRR TOOMEY STRL 70CC (SYRINGE) IMPLANT
TUBING CONNECTING 10 (TUBING) ×3 IMPLANT

## 2015-06-22 NOTE — Anesthesia Postprocedure Evaluation (Signed)
Anesthesia Post Note  Patient: Mark Blankenship  Procedure(s) Performed: Procedure(s) (LRB): CYSTOSCOPY WITH RETROGRADE PYELOGRAM (Bilateral) TRANSURETHRAL RESECTION OF BLADDER TUMOR (TURBT) (N/A)  Patient location during evaluation: PACU Anesthesia Type: General Level of consciousness: sedated Pain management: pain level controlled Vital Signs Assessment: post-procedure vital signs reviewed and stable Respiratory status: spontaneous breathing and respiratory function stable Cardiovascular status: stable Anesthetic complications: no    Last Vitals:  Filed Vitals:   06/22/15 1815 06/22/15 1817  BP:  116/78  Pulse: 50   Temp:  35.8 C  Resp: 12     Last Pain: There were no vitals filed for this visit.               [name]

## 2015-06-22 NOTE — Anesthesia Procedure Notes (Signed)
Procedure Name: LMA Insertion Date/Time: 06/22/2015 5:03 PM Performed by: Enrigue Catena E Pre-anesthesia Checklist: Patient identified, Emergency Drugs available, Suction available and Patient being monitored Patient Re-evaluated:Patient Re-evaluated prior to inductionOxygen Delivery Method: Circle System Utilized Preoxygenation: Pre-oxygenation with 100% oxygen Intubation Type: IV induction Ventilation: Mask ventilation without difficulty LMA: LMA inserted LMA Size: 5.0 Tube type: Oral Number of attempts: 1 Airway Equipment and Method: Stylet and Oral airway Placement Confirmation: positive ETCO2 Tube secured with: Tape Dental Injury: Teeth and Oropharynx as per pre-operative assessment

## 2015-06-22 NOTE — Anesthesia Preprocedure Evaluation (Addendum)
Anesthesia Evaluation  Patient identified by MRN, date of birth, ID band Patient awake  General Assessment Comment:Past Medical History Diagnosis Date . History of bladder cancer 2012   Mass removed, plus gets ongoing BCG bladder infusions by urologist (Dr. Alinda Money). Marland Kitchen CAD (coronary artery disease)    '05 MI > DES to circumflex . HTN (hypertension)  . Hyperlipidemia  . Diabetes mellitus type 2, controlled    HbA1c 02/2011 was 6.6% . Wolf-Parkinson-White syndrome  . Tobacco abuse  . BPH (benign prostatic hyperplasia)    with hx of acute prostatitis . Elevated PSA 09/2011   with prostate nodule. Prostate bx ALL BENIGN 10/10/11 (Dr. Alinda Money) . Myocardial infarction 2005 . GERD (gastroesophageal reflux disease)    occasional . Headache(784.0)    cluster headaches a long time ago . Arthritis  . Complication of anesthesia    "tried to fight when waking up" . Thrombocytopenia    Stable 2012-2016       Reviewed: Allergy & Precautions, NPO status , Patient's Chart, lab work & pertinent test results  History of Anesthesia Complications (+) Emergence Delirium and history of anesthetic complications  Airway Mallampati: II  TM Distance: >3 FB Neck ROM: Full    Dental no notable dental hx. (+) Dental Advisory Given   Pulmonary former smoker,    Pulmonary exam normal        Cardiovascular Exercise Tolerance: Good hypertension, Pt. on medications and Pt. on home beta blockers + CAD, + Past MI and + Cardiac Stents  Normal cardiovascular exam     Neuro/Psych  Headaches, negative psych ROS   GI/Hepatic Neg liver ROS, GERD  ,  Endo/Other  diabetes, Type 2, Oral Hypoglycemic Agents  Renal/GU negative Renal ROS  negative genitourinary   Musculoskeletal  (+) Arthritis ,   Abdominal (+) + obese,   Peds negative pediatric ROS (+)   Hematology negative hematology ROS (+)   Anesthesia Other Findings   Reproductive/Obstetrics negative OB ROS                           Anesthesia Physical Anesthesia Plan  ASA: III  Anesthesia Plan: General   Post-op Pain Management:    Induction: Intravenous  Airway Management Planned: LMA  Additional Equipment:   Intra-op Plan:   Post-operative Plan: Extubation in OR  Informed Consent: I have reviewed the patients History and Physical, chart, labs and discussed the procedure including the risks, benefits and alternatives for the proposed anesthesia with the patient or authorized representative who has indicated his/her understanding and acceptance.   Dental advisory given  Plan Discussed with: CRNA  Anesthesia Plan Comments:         Anesthesia Quick Evaluation

## 2015-06-22 NOTE — Op Note (Signed)
Operative Note:  Preoperative Diagnosis: Bladder mass  Postoperative Diagnosis:  Same  Procedure(s) Performed:  1. Cystourethroscopy 2. Bladder biopsies and fulguration 3. Right retrograde pyelogram 4. Left retrograde pyelogram 5. Intra-operative fluoroscopy with interpretation <1 hour  Surgeon:  Dutch Gray, MD  Resident Surgeon:  E. Harvel Ricks, MD  Assistant(s):  None  Anesthesia:  General via LMA  Fluids:  See anesthesia record  Estimated blood loss:  5 mL  Specimens:  Left posterior wall bladder biopsies  Cultures:  None  Drains:  None  Complications:  None  Indications: See prior notes  Findings:  Small erythematous raised area on the posterior left bladder wall which was biopsied three times and the base was fulgurated. Thorough cystoscopy demonstrated no other masses, erythematous areas or bladder stones.   Retrograde pyelogram interpretation: Bilateral retrograde pyelograms demonstrated no hydroureteronephrosis, filling defects or extravasation of contrast bilaterally.  Description:  The patient was correctly identified in the preop holding area where written informed consent as well potential risk and complication reviewed. He agreed. They were brought back to the operative suite where a preinduction timeout was performed. Once correct information was verified, general anesthesia was induced via endotracheal tube. They were then gently placed into dorsal lithotomy position with SCDs in place for VTE prophylaxis. They were prepped and draped in the usual sterile fashion and given appropriate preoperative antibiotics. A second timeout was then performed.   We inserted a 22F rigid cystoscope per urethra with copious lubrication and normal saline irrigation running. This demonstrated findings as described above.  We then obtained a rigid biopsy and obtained 3 biopsies from the erythematous area on the left posterior bladder wall. We then used the bugby cautery to  fulgurated the base and edges of the biopsy site. There was excellent hemostasis at the conclusion with irrigation turned off and the bladder relatively empty.  We then turned our attention to the bilateral retrograde pyelograms. We inserted a cone tip open ended catheter into the right ureteral orifice and performed a retrograde pyelogram. This demonstrated no hydroureteronephrosis, filling defects or extravasation of contrast. We removed the open ended catheter and there was prompt drainage of contrast. We then inserted a cone tip open ended ureteral catheter into the left ureteral orifice and performed a left retrograde pyelogram. This demonstrated  no hydroureteronephrosis, filling defects or extravasation of contrast. We removed the open ended catheter and there was prompt drainage of contrast.   We emptied the patient's bladder and all instrumentation was removed. Urine at the conclusion of the case was clear yellow without clots.    Post Op Plan:   1. Discharge patient home when meets PACU criteria and voids.  Attestation:  Dr. Alinda Money was present and scrubbed for the entirety of the procedure.

## 2015-06-22 NOTE — Addendum Note (Signed)
Addendum  created 06/22/15 1903 by Lissa Morales, CRNA   Modules edited: Anesthesia Attestations

## 2015-06-22 NOTE — Transfer of Care (Signed)
Immediate Anesthesia Transfer of Care Note  Patient: Mark Blankenship  Procedure(s) Performed: Procedure(s) with comments: CYSTOSCOPY WITH RETROGRADE PYELOGRAM (Bilateral) TRANSURETHRAL RESECTION OF BLADDER TUMOR (TURBT) (N/A) - **BLADDER BIOPSY** **RESECTION**  Patient Location: PACU  Anesthesia Type:General  Level of Consciousness: awake, alert , oriented and patient cooperative  Airway & Oxygen Therapy: Patient Spontanous Breathing and Patient connected to face mask oxygen  Post-op Assessment: Report given to RN, Post -op Vital signs reviewed and stable and Patient moving all extremities X 4  Post vital signs: stable  Last Vitals:  Filed Vitals:   06/22/15 1742 06/22/15 1745  BP: 111/71   Pulse:    Temp:  36.7 C  Resp:      Complications: No apparent anesthesia complications

## 2015-06-22 NOTE — Discharge Instructions (Signed)
1. You may see some blood in the urine and may have some burning with urination for 48-72 hours. You also may notice that you have to urinate more frequently or urgently after your procedure which is normal.  °2. You should call should you develop an inability urinate, fever > 101, persistent nausea and vomiting that prevents you from eating or drinking to stay hydrated.  °

## 2015-06-23 ENCOUNTER — Encounter (HOSPITAL_COMMUNITY): Payer: Self-pay | Admitting: Urology

## 2015-06-24 ENCOUNTER — Encounter (HOSPITAL_COMMUNITY): Payer: Self-pay | Admitting: Family Medicine

## 2015-08-26 ENCOUNTER — Ambulatory Visit (INDEPENDENT_AMBULATORY_CARE_PROVIDER_SITE_OTHER): Payer: BLUE CROSS/BLUE SHIELD | Admitting: Family Medicine

## 2015-08-26 ENCOUNTER — Encounter: Payer: Self-pay | Admitting: Family Medicine

## 2015-08-26 ENCOUNTER — Other Ambulatory Visit: Payer: Self-pay | Admitting: Family Medicine

## 2015-08-26 VITALS — BP 105/73 | HR 64 | Temp 97.5°F | Resp 16 | Ht 66.0 in | Wt 191.0 lb

## 2015-08-26 DIAGNOSIS — Z125 Encounter for screening for malignant neoplasm of prostate: Secondary | ICD-10-CM | POA: Diagnosis not present

## 2015-08-26 DIAGNOSIS — I10 Essential (primary) hypertension: Secondary | ICD-10-CM

## 2015-08-26 DIAGNOSIS — Z23 Encounter for immunization: Secondary | ICD-10-CM

## 2015-08-26 DIAGNOSIS — Z0000 Encounter for general adult medical examination without abnormal findings: Secondary | ICD-10-CM

## 2015-08-26 DIAGNOSIS — E7800 Pure hypercholesterolemia, unspecified: Secondary | ICD-10-CM

## 2015-08-26 DIAGNOSIS — E119 Type 2 diabetes mellitus without complications: Secondary | ICD-10-CM | POA: Diagnosis not present

## 2015-08-26 LAB — CBC WITH DIFFERENTIAL/PLATELET
Basophils Absolute: 0 10*3/uL (ref 0.0–0.1)
Basophils Relative: 0.3 % (ref 0.0–3.0)
EOS PCT: 1.7 % (ref 0.0–5.0)
Eosinophils Absolute: 0.2 10*3/uL (ref 0.0–0.7)
HCT: 49.8 % (ref 39.0–52.0)
HEMOGLOBIN: 16.7 g/dL (ref 13.0–17.0)
Lymphocytes Relative: 14.2 % (ref 12.0–46.0)
Lymphs Abs: 1.3 10*3/uL (ref 0.7–4.0)
MCHC: 33.5 g/dL (ref 30.0–36.0)
MCV: 91.1 fl (ref 78.0–100.0)
MONO ABS: 0.6 10*3/uL (ref 0.1–1.0)
Monocytes Relative: 6.7 % (ref 3.0–12.0)
Neutro Abs: 7.2 10*3/uL (ref 1.4–7.7)
Neutrophils Relative %: 77.1 % — ABNORMAL HIGH (ref 43.0–77.0)
Platelets: 147 10*3/uL — ABNORMAL LOW (ref 150.0–400.0)
RBC: 5.48 Mil/uL (ref 4.22–5.81)
RDW: 14 % (ref 11.5–15.5)
WBC: 9.3 10*3/uL (ref 4.0–10.5)

## 2015-08-26 LAB — COMPREHENSIVE METABOLIC PANEL
ALBUMIN: 4.4 g/dL (ref 3.5–5.2)
ALK PHOS: 61 U/L (ref 39–117)
ALT: 21 U/L (ref 0–53)
AST: 16 U/L (ref 0–37)
BILIRUBIN TOTAL: 0.7 mg/dL (ref 0.2–1.2)
BUN: 15 mg/dL (ref 6–23)
CO2: 26 mEq/L (ref 19–32)
CREATININE: 1.07 mg/dL (ref 0.40–1.50)
Calcium: 9.7 mg/dL (ref 8.4–10.5)
Chloride: 102 mEq/L (ref 96–112)
GFR: 74.87 mL/min (ref >60.00)
Glucose, Bld: 168 mg/dL — ABNORMAL HIGH (ref 70–99)
POTASSIUM: 4.4 meq/L (ref 3.5–5.1)
SODIUM: 138 meq/L (ref 135–145)
TOTAL PROTEIN: 7.1 g/dL (ref 6.0–8.3)

## 2015-08-26 LAB — LIPID PANEL
CHOLESTEROL: 125 mg/dL (ref 0–200)
HDL: 29.5 mg/dL — ABNORMAL LOW (ref >39.00)
LDL Cholesterol: 66 mg/dL (ref 0–99)
NonHDL: 95.9
Total CHOL/HDL Ratio: 4
Triglycerides: 148 mg/dL (ref 0.0–149.0)
VLDL: 29.6 mg/dL (ref 0.0–40.0)

## 2015-08-26 LAB — HEMOGLOBIN A1C: HEMOGLOBIN A1C: 7.9 % — AB (ref 4.6–6.5)

## 2015-08-26 LAB — TSH: TSH: 0.96 u[IU]/mL (ref 0.35–4.50)

## 2015-08-26 MED ORDER — ZOSTER VACCINE LIVE 19400 UNT/0.65ML ~~LOC~~ SOLR: 0.6500 mL | Freq: Once | SUBCUTANEOUS | Status: DC

## 2015-08-26 MED ORDER — LINAGLIPTIN 5 MG PO TABS: 5.0000 mg | ORAL_TABLET | Freq: Every day | ORAL | Status: DC

## 2015-08-26 NOTE — Progress Notes (Signed)
Office Note 08/26/2015  CC:  Chief Complaint  Patient presents with  . Annual Exam    Pt is fasting.    HPI:  Mark Blankenship is a 60 y.o. White male who is here for annual health maintenance exam. Dr. Alinda Money, urologist, is following his PSAs/DRE's for hx of elevated PSA. He recently finished BCG treatment for a noninvasive bladder cancer.  Feeling fine. Compliant with all chronic meds. Last eye exam was about 2 yrs ago.  Past Medical History  Diagnosis Date  . History of bladder cancer 2012    Mass removed, plus gets ongoing BCG bladder infusions by urologist (Dr. Alinda Money)  . CAD (coronary artery disease)      '05 MI > DES to circumflex  . HTN (hypertension)   . Hyperlipidemia   . Diabetes mellitus type 2, controlled ([place])     HbA1c 02/2011 was 6.6%  . Wolf-Parkinson-White syndrome   . Tobacco abuse     Quit  . BPH (benign prostatic hyperplasia)     with hx of acute prostatitis  . Elevated PSA 09/2011    with prostate nodule.  Prostate bx ALL BENIGN 10/10/11 (Dr. Alinda Money)  . Myocardial infarction [facility]) 2005    followed by Dr. Stanford Breed  . GERD (gastroesophageal reflux disease)     occasional  . Headache(784.0)     cluster headaches a long time ago  . Arthritis   . Thrombocytopenia ([place])     Stable 2012-2016  . Complication of anesthesia     agitation   . History of bronchitis   . History of vertigo   . Urinary tract infection   . Ear infection 06/17/2015    Past Surgical History  Procedure Laterality Date  . Bladder tumor excision  11/08/10; 05/2015    urothelial carcinoma (Dr. Alinda Money).  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  . Myocardial perfusion study  2009    Neg ischemia, EF 52%  . Lexiscan  2012    Normal/negative  . Lumbar spine surgery  [facility] in [place] ([place])-no trouble since last surgery  . Colonoscopy  2010    Dr. Isa Rankin, repeat 2020.  Patient wants to go to different GI  when time for repeat.  . Vasectomy    . Cardiac catheterization  2005    with stent placement  . Transurethral resection of bladder tumor N/A 06/06/2013    Procedure: TRANSURETHRAL RESECTION OF BLADDER TUMOR (TURBT);  Surgeon: Dutch Gray, MD;  Location: [facility];  Service: Urology;  Laterality: N/A;.  PATH: squamous metaplasia, fibrosis with chronic inflammation, no malignancy.  . Cystoscopy with retrograde pyelogram, ureteroscopy and stent placement N/A 06/06/2013    Procedure: CYSTOSCOPY WITH RETROGRADE PYELOGRAM;  Surgeon: Dutch Gray, MD;  Location: [facility];  Service: Urology;  Laterality: N/A;  . Cystoscopy w/ retrogrades Bilateral 06/22/2015    Procedure: CYSTOSCOPY WITH RETROGRADE PYELOGRAM;  Surgeon: Raynelle Bring, MD;  Location: [facility];  Service: Urology;  Laterality: Bilateral;  . Transurethral resection of bladder tumor N/A 06/22/2015    Carcinoma in situ: no invasive malignancy.  Procedure: TRANSURETHRAL RESECTION OF BLADDER TUMOR (TURBT);  Surgeon: Raynelle Bring, MD;  Location: [facility];  Service: Urology;  Laterality: N/A;  **BLADDER BIOPSY*RESECTION**    Family History  Problem Relation Age of Onset  . Hypertension Mother   . Cancer Father     colon and prostate, mets to lungs.  . Hypertension Father   . Heart disease  Brother     d. 2 MI  . Multiple sclerosis Brother   . Diabetes Brother   . Heart disease Paternal Uncle     Social History   Social History  . Marital Status: Married    Spouse Name: N/A  . Number of Children: N/A  . Years of Education: N/A   Occupational History  . Not on file.   Social History Main Topics  . Smoking status: Former Smoker -- 0.25 packs/day for 40 years    Types: Cigarettes    Quit date: 09/08/2013  . Smokeless tobacco: Never Used  . Alcohol Use: No  . Drug Use: No  . Sexual Activity: Not on file   Other Topics Concern  . Not on file   Social History Narrative   Married, 2 children, 4 grandchildren--[place].   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   Tob: 30 pack-yr hx, quit 08/2011.   No alcohol or drug use.   Active on farm but no formal exercise regimen.    Outpatient Prescriptions Prior to Visit  Medication Sig Dispense Refill  . acetaminophen (TYLENOL) 500 MG tablet Take 1,000 mg by mouth every 6 (six) hours as needed for mild pain or headache.     Marland Kitchen aspirin EC 81 MG tablet Take 81 mg by mouth daily.    Marland Kitchen atenolol (TENORMIN) 50 MG tablet Take 1 tablet (50 mg total) by mouth daily with lunch. 90 tablet 3  . fluticasone (FLONASE) 50 MCG/ACT nasal spray Place 1 spray into both nostrils daily. (Patient taking differently: Place 1 spray into both nostrils daily as needed for allergies. ) 16 g 3  . glipiZIDE (GLUCOTROL XL) 10 MG 24 hr tablet TAKE ONE TABLET BY MOUTH EVERY DAY WITH BREAKFAST (Patient taking differently: TAKE ONE TABLET BY MOUTH EVERY DAY WITH LUNCH) 90 tablet 1  . HYDROcodone-acetaminophen (NORCO) 5-325 MG tablet Take 1 tablet by mouth every 6 (six) hours as needed for moderate pain. 21 tablet 0  . lisinopril (PRINIVIL,ZESTRIL) 20 MG tablet TAKE ONE TABLET BY MOUTH EVERY DAY WITH LUNCH 90 tablet 1  . metFORMIN (GLUCOPHAGE) 1000 MG tablet TAKE ONE TABLET BY MOUTH TWICE DAILY WITH A MEAL (NEED OFFICE VISIT FOR MORE REFILLS) (Patient taking differently: TAKE ONE TABLET BY MOUTH TWICE DAILY WITH A MEAL) 180 tablet 1  . phenazopyridine (PYRIDIUM) 200 MG tablet Take 1 tablet (200 mg total) by mouth 3 (three) times daily as needed (burning with urination). 21 tablet 0  . simvastatin (ZOCOR) 40 MG tablet Take 1 tablet (40 mg total) by mouth every evening. 90 tablet 3  . tamsulosin (FLOMAX) 0.4 MG CAPS capsule TAKE 1 CAPSULE BY MOUTH EVERY DAY AFTER SUPPER 90 capsule 1  . ciprofloxacin-dexamethasone (CIPRODEX) otic suspension Place 4 drops into the left ear 2 (two) times daily. (Patient not taking:  Reported on 08/26/2015) 7.5 mL 0   No facility-administered medications prior to visit.    No Known Allergies  ROS Review of Systems  Constitutional: Negative for fever, chills, appetite change and fatigue.  HENT: Negative for congestion, dental problem, ear pain and sore throat.   Eyes: Negative for discharge, redness and visual disturbance.  Respiratory: Negative for cough, chest tightness, shortness of breath and wheezing.   Cardiovascular: Negative for chest pain, palpitations and leg swelling.  Gastrointestinal: Negative for nausea, vomiting, abdominal pain, diarrhea and blood in  stool.  Genitourinary: Negative for dysuria, urgency, frequency, hematuria, flank pain and difficulty urinating.  Musculoskeletal: Negative for myalgias, back pain, joint swelling, arthralgias and neck stiffness.  Skin: Negative for pallor and rash.  Neurological: Negative for dizziness, speech difficulty, weakness and headaches.  Hematological: Negative for adenopathy. Does not bruise/bleed easily.  Psychiatric/Behavioral: Negative for confusion and sleep disturbance. The patient is not nervous/anxious.     PE; Blood pressure 105/73, pulse 64, temperature 97.5 F (36.4 C), temperature source Oral, resp. rate 16, height 5\' 6"  (1.676 m), weight 191 lb (86.637 kg), SpO2 96 %. Gen: Alert, well appearing.  Patient is oriented to person, place, time, and situation. AFFECT: pleasant, lucid thought and speech. ENT: Ears: EACs clear, normal epithelium.  TMs with good light reflex and landmarks bilaterally.  Eyes: no injection, icteris, swelling, or exudate.  EOMI, PERRLA. Nose: no drainage or turbinate edema/swelling.  No injection or focal lesion.  Mouth: lips without lesion/swelling.  Oral mucosa pink and moist.  Dentition intact and without obvious caries or gingival swelling.  Oropharynx without erythema, exudate, or swelling.  Neck: supple/nontender.  No LAD, mass, or TM.  Carotid pulses 2+ bilaterally, without  bruits. CV: RRR, no m/r/g.   LUNGS: CTA bilat, nonlabored resps, good aeration in all lung fields. ABD: soft, NT, ND, BS normal.  No hepatospenomegaly or mass.  No bruits. EXT: no clubbing, cyanosis, or edema.  Musculoskeletal: no joint swelling, erythema, warmth, or tenderness.  ROM of all joints intact. Skin - no sores or suspicious lesions or rashes or color changes Rectal: deferred b/c urologist does this for this pt.  Pertinent labs:  None today.  Lab Results  Component Value Date   TSH 1.34 08/20/2014   Lab Results  Component Value Date   WBC 8.2 06/16/2015   HGB 16.4 06/16/2015   HCT 46.4 06/16/2015   MCV 88.0 06/16/2015   PLT 126* 06/16/2015   Lab Results  Component Value Date   CREATININE 1.19 06/16/2015   BUN 15 06/16/2015   NA 138 06/16/2015   K 4.3 06/16/2015   CL 104 06/16/2015   CO2 25 06/16/2015   Lab Results  Component Value Date   ALT 19 08/20/2014   AST 23 08/20/2014   ALKPHOS 55 08/20/2014   BILITOT 0.6 08/20/2014   Lab Results  Component Value Date   CHOL 107 08/20/2014   Lab Results  Component Value Date   HDL 27.50* 08/20/2014   Lab Results  Component Value Date   LDLCALC 63 08/20/2014   Lab Results  Component Value Date   TRIG 82.0 08/20/2014   Lab Results  Component Value Date   CHOLHDL 4 08/20/2014   Lab Results  Component Value Date   HGBA1C 6.6 05/28/2015    ASSESSMENT AND PLAN:   Health maintenance exam: Reviewed age and gender appropriate health maintenance issues (prudent diet, regular exercise, health risks of tobacco and excessive alcohol, use of seatbelts, fire alarms in home, use of sunscreen).  Also reviewed age and gender appropriate health screening as well as vaccine recommendations. Vacc's UTD except zostavax, which I printed rx for him today. Colon ca screening due again 2020. Prostate ca screening/monitoring for elevated PSA is being done by Dr. Alinda Money, his urologist. Pt overdue for diab retpthy screening  exam: reminded again today. Check fasting HP labs + A1c today.  An After Visit Summary was printed and given to the patient.  FOLLOW UP:  Return in about 4 months (around 12/24/2015) for routine chronic  illness f/u.

## 2015-08-26 NOTE — Progress Notes (Signed)
Pre visit review using our clinic review tool, if applicable. No additional management support is needed unless otherwise documented below in the visit note. 

## 2015-09-01 ENCOUNTER — Inpatient Hospital Stay (HOSPITAL_COMMUNITY)
Admission: EM | Admit: 2015-09-01 | Discharge: 2015-09-04 | DRG: 282 | Disposition: A | Discharge status: Home / Self Care | Payer: BLUE CROSS/BLUE SHIELD | Attending: Cardiology | Admitting: Cardiology

## 2015-09-01 ENCOUNTER — Encounter (HOSPITAL_COMMUNITY)
Admission: EM | Disposition: A | Discharge status: Home / Self Care | Payer: Self-pay | Source: Home / Self Care | Attending: Cardiology

## 2015-09-01 ENCOUNTER — Encounter (HOSPITAL_COMMUNITY): Payer: Self-pay | Admitting: Cardiology

## 2015-09-01 ENCOUNTER — Emergency Department (HOSPITAL_COMMUNITY): Payer: BLUE CROSS/BLUE SHIELD

## 2015-09-01 DIAGNOSIS — Z7984 Long term (current) use of oral hypoglycemic drugs: Secondary | ICD-10-CM | POA: Diagnosis not present

## 2015-09-01 DIAGNOSIS — I25110 Atherosclerotic heart disease of native coronary artery with unstable angina pectoris: Secondary | ICD-10-CM | POA: Diagnosis present

## 2015-09-01 DIAGNOSIS — E119 Type 2 diabetes mellitus without complications: Secondary | ICD-10-CM | POA: Diagnosis present

## 2015-09-01 DIAGNOSIS — E785 Hyperlipidemia, unspecified: Secondary | ICD-10-CM | POA: Diagnosis present

## 2015-09-01 DIAGNOSIS — I2510 Atherosclerotic heart disease of native coronary artery without angina pectoris: Secondary | ICD-10-CM | POA: Diagnosis not present

## 2015-09-01 DIAGNOSIS — I10 Essential (primary) hypertension: Secondary | ICD-10-CM | POA: Diagnosis present

## 2015-09-01 DIAGNOSIS — I214 Non-ST elevation (NSTEMI) myocardial infarction: Secondary | ICD-10-CM | POA: Diagnosis present

## 2015-09-01 DIAGNOSIS — I77810 Thoracic aortic ectasia: Secondary | ICD-10-CM | POA: Diagnosis present

## 2015-09-01 DIAGNOSIS — E7800 Pure hypercholesterolemia, unspecified: Secondary | ICD-10-CM | POA: Diagnosis present

## 2015-09-01 DIAGNOSIS — I252 Old myocardial infarction: Secondary | ICD-10-CM | POA: Diagnosis not present

## 2015-09-01 DIAGNOSIS — R300 Dysuria: Secondary | ICD-10-CM | POA: Diagnosis present

## 2015-09-01 DIAGNOSIS — R079 Chest pain, unspecified: Secondary | ICD-10-CM | POA: Diagnosis present

## 2015-09-01 DIAGNOSIS — N402 Nodular prostate without lower urinary tract symptoms: Secondary | ICD-10-CM | POA: Diagnosis present

## 2015-09-01 DIAGNOSIS — Z8551 Personal history of malignant neoplasm of bladder: Secondary | ICD-10-CM | POA: Diagnosis not present

## 2015-09-01 DIAGNOSIS — Z955 Presence of coronary angioplasty implant and graft: Secondary | ICD-10-CM

## 2015-09-01 DIAGNOSIS — Z7982 Long term (current) use of aspirin: Secondary | ICD-10-CM

## 2015-09-01 DIAGNOSIS — Z720 Tobacco use: Secondary | ICD-10-CM | POA: Diagnosis present

## 2015-09-01 DIAGNOSIS — I255 Ischemic cardiomyopathy: Secondary | ICD-10-CM | POA: Diagnosis present

## 2015-09-01 DIAGNOSIS — F17210 Nicotine dependence, cigarettes, uncomplicated: Secondary | ICD-10-CM | POA: Diagnosis present

## 2015-09-01 HISTORY — DX: Ischemic cardiomyopathy: I25.5

## 2015-09-01 HISTORY — DX: Thoracic aortic ectasia: I77.810

## 2015-09-01 HISTORY — PX: CARDIAC CATHETERIZATION: SHX172

## 2015-09-01 LAB — BASIC METABOLIC PANEL
ANION GAP: 12 (ref 5–15)
BUN: 14 mg/dL (ref 6–20)
CALCIUM: 9.5 mg/dL (ref 8.9–10.3)
CO2: 21 mmol/L — ABNORMAL LOW (ref 22–32)
CREATININE: 1.13 mg/dL (ref 0.61–1.24)
Chloride: 101 mmol/L (ref 101–111)
GFR calc Af Amer: >60 mL/min (ref >60)
GLUCOSE: 287 mg/dL — AB (ref 65–99)
Potassium: 4.7 mmol/L (ref 3.5–5.1)
Sodium: 134 mmol/L — ABNORMAL LOW (ref 135–145)

## 2015-09-01 LAB — CBC
HCT: 44.2 % (ref 39.0–52.0)
Hemoglobin: 15.9 g/dL (ref 13.0–17.0)
MCH: 31.5 pg (ref 26.0–34.0)
MCHC: 36 g/dL (ref 30.0–36.0)
MCV: 87.5 fL (ref 78.0–100.0)
Platelets: 113 10*3/uL — ABNORMAL LOW (ref 150–400)
RBC: 5.05 MIL/uL (ref 4.22–5.81)
RDW: 13.6 % (ref 11.5–15.5)
WBC: 6.9 10*3/uL (ref 4.0–10.5)

## 2015-09-01 LAB — TROPONIN I
TROPONIN I: 1.44 ng/mL — AB (ref <0.031)
Troponin I: 0.26 ng/mL — ABNORMAL HIGH (ref <0.031)
Troponin I: 8.92 ng/mL (ref <0.031)

## 2015-09-01 LAB — PROTIME-INR
INR: 1.05 (ref 0.00–1.49)
Prothrombin Time: 13.9 seconds (ref 11.6–15.2)

## 2015-09-01 LAB — I-STAT TROPONIN, ED: TROPONIN I, POC: 0.03 ng/mL (ref 0.00–0.08)

## 2015-09-01 LAB — POCT ACTIVATED CLOTTING TIME: ACTIVATED CLOTTING TIME: 178 s

## 2015-09-01 LAB — D-DIMER, QUANTITATIVE: D-Dimer, Quant: 0.41 ug/mL-FEU (ref 0.00–0.50)

## 2015-09-01 SURGERY — LEFT HEART CATH AND CORONARY ANGIOGRAPHY

## 2015-09-01 MED ORDER — HEPARIN (PORCINE) IN NACL 100-0.45 UNIT/ML-% IJ SOLN
1000.0000 [IU]/h | INTRAMUSCULAR | Status: DC
  Administered 2015-09-01: 1000 [IU]/h via INTRAVENOUS
  Filled 2015-09-01: qty 250

## 2015-09-01 MED ORDER — VERAPAMIL HCL 2.5 MG/ML IV SOLN
INTRAVENOUS | Status: AC
  Filled 2015-09-01: qty 2

## 2015-09-01 MED ORDER — HEPARIN (PORCINE) IN NACL 2-0.9 UNIT/ML-% IJ SOLN
INTRAMUSCULAR | Status: AC
  Filled 2015-09-01: qty 1000

## 2015-09-01 MED ORDER — SODIUM CHLORIDE 0.9 % IV SOLN
INTRAVENOUS | Status: DC | PRN
  Administered 2015-09-01: 300 mL via INTRAVENOUS

## 2015-09-01 MED ORDER — MIDAZOLAM HCL 2 MG/2ML IJ SOLN
INTRAMUSCULAR | Status: DC | PRN
  Administered 2015-09-01 (×2): 1 mg via INTRAVENOUS

## 2015-09-01 MED ORDER — SODIUM CHLORIDE 0.9% FLUSH
3.0000 mL | Freq: Two times a day (BID) | INTRAVENOUS | Status: DC
  Administered 2015-09-01 – 2015-09-04 (×3): 3 mL via INTRAVENOUS

## 2015-09-01 MED ORDER — ACETAMINOPHEN 325 MG PO TABS
650.0000 mg | ORAL_TABLET | ORAL | Status: DC | PRN
Start: 2015-09-01 — End: 2015-09-04
  Administered 2015-09-02: 650 mg via ORAL
  Filled 2015-09-01: qty 2

## 2015-09-01 MED ORDER — HEPARIN (PORCINE) IN NACL 2-0.9 UNIT/ML-% IJ SOLN
INTRAMUSCULAR | Status: DC | PRN
  Administered 2015-09-01: 17:00:00

## 2015-09-01 MED ORDER — HEPARIN (PORCINE) IN NACL 100-0.45 UNIT/ML-% IJ SOLN
1000.0000 [IU]/h | INTRAMUSCULAR | Status: DC
  Administered 2015-09-02: 1000 [IU]/h via INTRAVENOUS
  Filled 2015-09-01: qty 250

## 2015-09-01 MED ORDER — SODIUM CHLORIDE 0.9% FLUSH
3.0000 mL | Freq: Two times a day (BID) | INTRAVENOUS | Status: DC
  Administered 2015-09-02: 3 mL via INTRAVENOUS

## 2015-09-01 MED ORDER — LIDOCAINE HCL (PF) 1 % IJ SOLN
INTRAMUSCULAR | Status: AC
  Filled 2015-09-01: qty 30

## 2015-09-01 MED ORDER — GI COCKTAIL ~~LOC~~
30.0000 mL | Freq: Once | ORAL | Status: AC
  Administered 2015-09-01: 30 mL via ORAL
  Filled 2015-09-01: qty 30

## 2015-09-01 MED ORDER — ASPIRIN 81 MG PO CHEW: 81.0000 mg | CHEWABLE_TABLET | ORAL | Status: DC

## 2015-09-01 MED ORDER — SODIUM CHLORIDE 0.9 % IV SOLN: 250.0000 mL | INTRAVENOUS | Status: DC | PRN

## 2015-09-01 MED ORDER — ASPIRIN EC 81 MG PO TBEC
81.0000 mg | DELAYED_RELEASE_TABLET | Freq: Every day | ORAL | Status: DC
  Administered 2015-09-02 – 2015-09-04 (×3): 81 mg via ORAL
  Filled 2015-09-01 (×3): qty 1

## 2015-09-01 MED ORDER — NITROGLYCERIN 1 MG/10 ML FOR IR/CATH LAB
INTRA_ARTERIAL | Status: AC
  Filled 2015-09-01: qty 10

## 2015-09-01 MED ORDER — HEPARIN SODIUM (PORCINE) 1000 UNIT/ML IJ SOLN
INTRAMUSCULAR | Status: AC
  Filled 2015-09-01: qty 1

## 2015-09-01 MED ORDER — LIDOCAINE HCL (PF) 1 % IJ SOLN
INTRAMUSCULAR | Status: DC | PRN
  Administered 2015-09-01: 30 mL

## 2015-09-01 MED ORDER — ATORVASTATIN CALCIUM 80 MG PO TABS
80.0000 mg | ORAL_TABLET | Freq: Every day | ORAL | Status: DC
  Administered 2015-09-02 – 2015-09-03 (×2): 80 mg via ORAL
  Filled 2015-09-01 (×2): qty 1

## 2015-09-01 MED ORDER — SODIUM CHLORIDE 0.9% FLUSH: 3.0000 mL | INTRAVENOUS | Status: DC | PRN

## 2015-09-01 MED ORDER — ASPIRIN 300 MG RE SUPP: 300.0000 mg | RECTAL | Status: AC

## 2015-09-01 MED ORDER — ASPIRIN EC 81 MG PO TBEC: 81.0000 mg | DELAYED_RELEASE_TABLET | Freq: Every day | ORAL | Status: DC

## 2015-09-01 MED ORDER — VERAPAMIL HCL 2.5 MG/ML IV SOLN
INTRAVENOUS | Status: DC | PRN
  Administered 2015-09-01 (×2): via INTRA_ARTERIAL

## 2015-09-01 MED ORDER — FENTANYL CITRATE (PF) 100 MCG/2ML IJ SOLN
INTRAMUSCULAR | Status: AC
  Filled 2015-09-01: qty 2

## 2015-09-01 MED ORDER — ONDANSETRON HCL 4 MG/2ML IJ SOLN: 4.0000 mg | Freq: Four times a day (QID) | INTRAMUSCULAR | Status: DC | PRN

## 2015-09-01 MED ORDER — LISINOPRIL 20 MG PO TABS
20.0000 mg | ORAL_TABLET | Freq: Every day | ORAL | Status: DC
  Administered 2015-09-01 – 2015-09-04 (×4): 20 mg via ORAL
  Filled 2015-09-01 (×4): qty 1

## 2015-09-01 MED ORDER — MIDAZOLAM HCL 2 MG/2ML IJ SOLN
INTRAMUSCULAR | Status: AC
  Filled 2015-09-01: qty 2

## 2015-09-01 MED ORDER — SODIUM CHLORIDE 0.9 % IV SOLN: INTRAVENOUS | Status: DC

## 2015-09-01 MED ORDER — ASPIRIN 81 MG PO CHEW: 81.0000 mg | CHEWABLE_TABLET | Freq: Every day | ORAL | Status: DC

## 2015-09-01 MED ORDER — ASPIRIN 81 MG PO CHEW
324.0000 mg | CHEWABLE_TABLET | ORAL | Status: AC
  Administered 2015-09-01: 324 mg via ORAL
  Filled 2015-09-01: qty 4

## 2015-09-01 MED ORDER — HEPARIN SODIUM (PORCINE) 1000 UNIT/ML IJ SOLN
INTRAMUSCULAR | Status: DC | PRN
  Administered 2015-09-01: 2500 [IU] via INTRAVENOUS
  Administered 2015-09-01: 2000 [IU] via INTRAVENOUS

## 2015-09-01 MED ORDER — SODIUM CHLORIDE 0.9 % WEIGHT BASED INFUSION: 1.0000 mL/kg/h | INTRAVENOUS | Status: AC

## 2015-09-01 MED ORDER — ATENOLOL 25 MG PO TABS
50.0000 mg | ORAL_TABLET | Freq: Every day | ORAL | Status: DC
  Administered 2015-09-01 – 2015-09-03 (×3): 50 mg via ORAL
  Filled 2015-09-01: qty 1
  Filled 2015-09-01: qty 2
  Filled 2015-09-01 (×2): qty 1

## 2015-09-01 MED ORDER — KETOROLAC TROMETHAMINE 30 MG/ML IJ SOLN
30.0000 mg | Freq: Once | INTRAMUSCULAR | Status: AC
  Administered 2015-09-01: 30 mg via INTRAVENOUS
  Filled 2015-09-01: qty 1

## 2015-09-01 MED ORDER — HEPARIN BOLUS VIA INFUSION
4000.0000 [IU] | Freq: Once | INTRAVENOUS | Status: AC
  Administered 2015-09-01: 4000 [IU] via INTRAVENOUS
  Filled 2015-09-01: qty 4000

## 2015-09-01 MED ORDER — IOHEXOL 350 MG/ML SOLN
INTRAVENOUS | Status: DC | PRN
  Administered 2015-09-01: 175 mL via INTRAVENOUS

## 2015-09-01 MED ORDER — ONDANSETRON HCL 4 MG/2ML IJ SOLN
4.0000 mg | Freq: Four times a day (QID) | INTRAMUSCULAR | Status: DC | PRN
Start: 2015-09-01 — End: 2015-09-04

## 2015-09-01 MED ORDER — ACETAMINOPHEN 325 MG PO TABS: 650.0000 mg | ORAL_TABLET | ORAL | Status: DC | PRN

## 2015-09-01 MED ORDER — NITROGLYCERIN 0.4 MG SL SUBL: 0.4000 mg | SUBLINGUAL_TABLET | SUBLINGUAL | Status: DC | PRN

## 2015-09-01 MED ORDER — TAMSULOSIN HCL 0.4 MG PO CAPS
0.4000 mg | ORAL_CAPSULE | Freq: Every day | ORAL | Status: DC
  Administered 2015-09-02 – 2015-09-04 (×3): 0.4 mg via ORAL
  Filled 2015-09-01 (×3): qty 1

## 2015-09-01 MED ORDER — FENTANYL CITRATE (PF) 100 MCG/2ML IJ SOLN
INTRAMUSCULAR | Status: DC | PRN
  Administered 2015-09-01: 50 ug via INTRAVENOUS
  Administered 2015-09-01: 25 ug via INTRAVENOUS

## 2015-09-01 SURGICAL SUPPLY — 20 items
ANGIOSEAL 8FR (Vascular Products) IMPLANT
CATH INFINITI 5 FR JL3.5 (CATHETERS) ×2 IMPLANT
CATH INFINITI JR4 5F (CATHETERS) ×2 IMPLANT
CATH LAUNCHER 5F EBU3.5 (CATHETERS) ×2 IMPLANT
CATH LAUNCHER 5F RADR (CATHETERS) ×1 IMPLANT
CATHETER LAUNCHER 5F RADR (CATHETERS) ×2
DEVICE CLOSURE ANGIOSEAL 8FR (Vascular Products) IMPLANT
DEVICE RAD COMP TR BAND LRG (VASCULAR PRODUCTS) ×2 IMPLANT
GLIDESHEATH SLEND A-KIT 6F 22G (SHEATH) ×2 IMPLANT
GUIDEWIRE ANGLED .035X260CM (WIRE) ×2 IMPLANT
KIT ESSENTIALS PG (KITS) ×2 IMPLANT
KIT HEART LEFT (KITS) ×2 IMPLANT
PACK CARDIAC CATHETERIZATION (CUSTOM PROCEDURE TRAY) ×2 IMPLANT
SHEATH PINNACLE 5F 10CM (SHEATH) ×2 IMPLANT
TRANSDUCER W/STOPCOCK (MISCELLANEOUS) ×2 IMPLANT
TUBING CIL FLEX 10 FLL-RA (TUBING) ×2 IMPLANT
WIRE ASAHI PROWATER 300CM (WIRE) ×2 IMPLANT
WIRE EMERALD 3MM-J .035X150CM (WIRE) ×2 IMPLANT
WIRE HI TORQ VERSACORE-J 145CM (WIRE) ×2 IMPLANT
WIRE SAFE-T 1.5MM-J .035X260CM (WIRE) ×4 IMPLANT

## 2015-09-01 NOTE — ED Provider Notes (Addendum)
CSN: [id number]     Arrival date & time 09/01/15  0718 History   First MD Initiated Contact with Patient 09/01/15 0809     Chief Complaint  Patient presents with  . Chest Pain     (Consider location/radiation/quality/duration/timing/severity/associated sxs/prior Treatment) HPI Patient presents with concern of chest pain. Patient awoke about 3 hours ago with left upper chest pain. Pain radiates into the left arm, with associated numbness in the extremity. There is mild associated dyspnea. No syncope, confusion, disorientation, fever, chills. No nausea, vomiting. Patient was well yesterday. Patient has a notable history of prior coronary stent, takes aspirin, 81 mg daily. Patient also has a notable history of prostate nodule, recent BCG therapy, 2 weeks ago. He describes  ongoing dysuria, but no incontinence, nor retention.  Past Medical History  Diagnosis Date  . History of bladder cancer 2012    Mass removed, plus gets ongoing BCG bladder infusions by urologist (Dr. Alinda Money)  . CAD (coronary artery disease)      '05 MI > DES to circumflex  . HTN (hypertension)   . Hyperlipidemia   . Diabetes mellitus type 2, controlled ([place])     HbA1c 02/2011 was 6.6%  . Wolf-Parkinson-White syndrome   . Tobacco abuse     Quit  . BPH (benign prostatic hyperplasia)     with hx of acute prostatitis  . Elevated PSA 09/2011    with prostate nodule.  Prostate bx ALL BENIGN 10/10/11 (Dr. Alinda Money)  . Myocardial infarction [facility]) 2005    followed by Dr. Stanford Breed  . GERD (gastroesophageal reflux disease)     occasional  . Headache(784.0)     cluster headaches a long time ago  . Arthritis   . Thrombocytopenia ([place])     Stable 2012-2016  . Complication of anesthesia     agitation   . History of bronchitis   . History of vertigo   . Urinary tract infection   . Ear infection 06/17/2015   Past Surgical History  Procedure Laterality Date  . Bladder tumor excision  11/08/10; 05/2015    urothelial  carcinoma (Dr. Alinda Money).  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  . Myocardial perfusion study  2009    Neg ischemia, EF 52%  . Lexiscan  2012    Normal/negative  . Lumbar spine surgery  [place] ([place])-no trouble since last surgery  . Colonoscopy  2010    Dr. Isa Rankin, repeat 2020.  Patient wants to go to different GI when time for repeat.  . Vasectomy    . Cardiac catheterization  2005    with stent placement  . Transurethral resection of bladder tumor N/A 06/06/2013    Procedure: TRANSURETHRAL RESECTION OF BLADDER TUMOR (TURBT);  Surgeon: Dutch Gray, MD;  Location: [facility];  Service: Urology;  Laterality: N/A;.  PATH: squamous metaplasia, fibrosis with chronic inflammation, no malignancy.  . Cystoscopy with retrograde pyelogram, ureteroscopy and stent placement N/A 06/06/2013    Procedure: CYSTOSCOPY WITH RETROGRADE PYELOGRAM;  Surgeon: Dutch Gray, MD;  Location: [facility];  Service: Urology;  Laterality: N/A;  . Cystoscopy w/ retrogrades Bilateral 06/22/2015    Procedure: CYSTOSCOPY WITH RETROGRADE PYELOGRAM;  Surgeon: Raynelle Bring, MD;  Location: [facility];  Service: Urology;  Laterality: Bilateral;  . Transurethral resection of bladder tumor N/A 06/22/2015    Carcinoma in situ: no invasive malignancy.  Procedure: TRANSURETHRAL RESECTION OF BLADDER TUMOR (TURBT);  Surgeon: Raynelle Bring, MD;  Location: [facility];  Service: Urology;  Laterality: N/A;  **BLADDER BIOPSY*RESECTION**   Family History  Problem Relation Age of Onset  . Hypertension Mother   . Cancer Father     colon and prostate, mets to lungs.  . Hypertension Father   . Heart disease Brother     d. 65 MI  . Multiple sclerosis Brother   . Diabetes Brother   . Heart disease Paternal Uncle    Social History  Substance Use Topics  . Smoking status: Current Some Day Smoker -- 0.25 packs/day for 40 years    Types: Cigarettes    Last Attempt to Quit:  09/08/2013  . Smokeless tobacco: Never Used  . Alcohol Use: No    Review of Systems  Constitutional:       Per HPI, otherwise negative  HENT:       Per HPI, otherwise negative  Respiratory:       Per HPI, otherwise negative  Cardiovascular:       Per HPI, otherwise negative  Gastrointestinal: Negative for vomiting.  Endocrine:       Negative aside from HPI  Genitourinary:       Neg aside from HPI   Musculoskeletal:       Per HPI, otherwise negative  Skin: Negative.   Neurological: Negative for syncope.      Allergies  Review of patient's allergies indicates no known allergies.  Home Medications   Prior to Admission medications   Medication Sig Start Date End Date Taking? Authorizing Provider  acetaminophen (TYLENOL) 500 MG tablet Take 1,000 mg by mouth every 6 (six) hours as needed for mild pain or headache.     Historical Provider, MD  aspirin EC 81 MG tablet Take 81 mg by mouth daily.    Historical Provider, MD  atenolol (TENORMIN) 50 MG tablet Take 1 tablet (50 mg total) by mouth daily with lunch. 05/20/15   Tammi Sou, MD  fluticasone (FLONASE) 50 MCG/ACT nasal spray Place 1 spray into both nostrils daily. Patient taking differently: Place 1 spray into both nostrils daily as needed for allergies.  10/10/13   Tammi Sou, MD  glipiZIDE (GLUCOTROL XL) 10 MG 24 hr tablet TAKE ONE TABLET BY MOUTH EVERY DAY WITH BREAKFAST Patient taking differently: TAKE ONE TABLET BY MOUTH EVERY DAY WITH LUNCH 03/23/15   Tammi Sou, MD  HYDROcodone-acetaminophen (NORCO) 5-325 MG tablet Take 1 tablet by mouth every 6 (six) hours as needed for moderate pain. 06/22/15   Acie Fredrickson, MD  linagliptin (TRADJENTA) 5 MG TABS tablet Take 1 tablet (5 mg total) by mouth daily. 08/26/15   Tammi Sou, MD  lisinopril (PRINIVIL,ZESTRIL) 20 MG tablet TAKE ONE TABLET BY MOUTH EVERY DAY WITH LUNCH 03/23/15   Tammi Sou, MD  metFORMIN (GLUCOPHAGE) 1000 MG tablet TAKE ONE TABLET  BY MOUTH TWICE DAILY WITH A MEAL (NEED OFFICE VISIT FOR MORE REFILLS) Patient taking differently: TAKE ONE TABLET BY MOUTH TWICE DAILY WITH A MEAL 04/07/15   Tammi Sou, MD  phenazopyridine (PYRIDIUM) 200 MG tablet Take 1 tablet (200 mg total) by mouth 3 (three) times daily as needed (burning with urination). 06/22/15   Acie Fredrickson, MD  simvastatin (ZOCOR) 40 MG tablet Take 1 tablet (40 mg total) by mouth every evening. 05/22/15   Tammi Sou, MD  tamsulosin (FLOMAX) 0.4 MG CAPS capsule TAKE 1 CAPSULE BY MOUTH EVERY DAY AFTER SUPPER 05/28/15   Tammi Sou, MD  zoster  vaccine live, PF, (ZOSTAVAX) 60454 UNT/0.65ML injection Inject 19,400 Units into the skin once. 08/26/15   Tammi Sou, MD   BP 150/100 mmHg  Pulse 70  Temp(Src) 97.5 F (36.4 C) (Oral)  Resp 18  Ht 5\' 6"  (1.676 m)  Wt 191 lb (86.637 kg)  BMI 30.84 kg/m2  SpO2 99% Physical Exam  Constitutional: He is oriented to person, place, and time. He appears well-developed. No distress.  HENT:  Head: Normocephalic and atraumatic.  Eyes: Conjunctivae and EOM are normal.  Neck:  Mild tenderness to palpation about the left superior trapezius, no deformity No cervical spine tenderness midline.   Cardiovascular: Normal rate and regular rhythm.   Pulmonary/Chest: No stridor. He has decreased breath sounds.  Abdominal: He exhibits no distension.  Musculoskeletal: He exhibits no edema.  Neurological: He is alert and oriented to person, place, and time. He displays no atrophy and no tremor. No cranial nerve deficit or sensory deficit. He exhibits normal muscle tone. He displays no seizure activity. Coordination normal.  Patient has subjective mild weakness in the left upper extremity compared to right. No objective neurologic deficits.  Skin: Skin is warm and dry.  Psychiatric: He has a normal mood and affect.  Nursing note and vitals reviewed.   ED Course  Procedures (including critical care time) Labs  Review Labs Reviewed  BASIC METABOLIC PANEL - Abnormal; Notable for the following:    Sodium 134 (*)    CO2 21 (*)    Glucose, Bld 287 (*)    All other components within normal limits  CBC - Abnormal; Notable for the following:    Platelets 113 (*)    All other components within normal limits  TROPONIN I - Abnormal; Notable for the following:    Troponin I 0.26 (*)    All other components within normal limits  D-DIMER, QUANTITATIVE (NOT AT [facility])  Randolm Idol, ED    Imaging Review Dg Chest 2 View  09/01/2015  CLINICAL DATA:  Left sided chest pain since 0500 today EXAM: CHEST  2 VIEW COMPARISON:  06/16/2015 FINDINGS: The heart size and mediastinal contours are within normal limits. Both lungs are clear. The visualized skeletal structures are unremarkable. IMPRESSION: No active cardiopulmonary disease. Electronically Signed   By: Kathreen Devoid   On: 09/01/2015 08:21   I have personally reviewed and evaluated these images and lab results as part of my medical decision-making.   EKG Interpretation   Date/Time:  Tuesday September 01 2015 07:23:52 EST Ventricular Rate:  72 PR Interval:  176 QRS Duration: 102 QT Interval:  388 QTC Calculation: 424 R Axis:   -20 Text Interpretation:  Normal sinus rhythm Inferior infarct , age  undetermined Abnormal ECG Sinus rhythm T wave abnormality Artifact  Abnormal ekg Confirmed by Carmin Muskrat  MD ([id number]) on 09/01/2015 7:27:20  AM      Initial evaluation patient appeared calm, had minimal ongoing pain.  11:11 AM Second troponin positive. Concern for NSTEMI, I discussed this case with our cardiology colleagues.   Patient started on heparin.  Update: Patient has been seen by our cardiology colleagues.  2:26 PM Pain has returned.  Trop #3 is increased.  Patient is going to the catheterization lab.  MDM  Patient presents after an episode of chest pain that began within hours of his arrival. Patient had initial normal troponin, both  his description of pain, his abnormal EKG, patient had serial troponin. Second troponin positive, and consistent with concern for ongoing coronary ischemia,  NSTEMI.  Patient had appropriate pain resolution, initiation of heparin, was admitted to our cardiology team for further evaluation and management.  CRITICAL CARE Performed by: Carmin Muskrat Total critical care time: 40 minutes Critical care time was exclusive of separately billable procedures and treating other patients. Critical care was necessary to treat or prevent imminent or life-threatening deterioration. Critical care was time spent personally by me on the following activities: development of treatment plan with patient and/or surrogate as well as nursing, discussions with consultants, evaluation of patient's response to treatment, examination of patient, obtaining history from patient or surrogate, ordering and performing treatments and interventions, ordering and review of laboratory studies, ordering and review of radiographic studies, pulse oximetry and re-evaluation of patient's condition.   Carmin Muskrat, MD 09/01/15 1329  Carmin Muskrat, MD 09/01/15 1426

## 2015-09-01 NOTE — ED Notes (Signed)
Dr.Lockwood at bedside  

## 2015-09-01 NOTE — ED Notes (Signed)
Cath lab advised they are ready for patient to come up

## 2015-09-01 NOTE — Progress Notes (Signed)
ANTICOAGULATION CONSULT NOTE - Initial Consult  Pharmacy Consult for heparin Indication: chest pain/ACS  No Known Allergies  Patient Measurements: Height: 5\' 6"  (167.6 cm) Weight: 191 lb (86.637 kg) IBW/kg (Calculated) : 63.8 Heparin Dosing Weight: 81.8kg  Vital Signs: Temp: 97.5 F (36.4 C) (02/07 0724) Temp Source: Oral (02/07 0724) BP: 106/80 mmHg (02/07 1115) Pulse Rate: 65 (02/07 1115)  Labs:  Recent Labs  09/01/15 0736 09/01/15 0959  HGB 15.9  --   HCT 44.2  --   PLT 113*  --   CREATININE 1.13  --   TROPONINI  --  0.26*    Estimated Creatinine Clearance: 71.7 mL/min (by C-G formula based on Cr of 1.13).   Medical History: Past Medical History  Diagnosis Date  . History of bladder cancer 2012    Mass removed, plus gets ongoing BCG bladder infusions by urologist (Dr. Alinda Money)  . CAD (coronary artery disease)      '05 MI > DES to circumflex  . HTN (hypertension)   . Hyperlipidemia   . Diabetes mellitus type 2, controlled ([place])     HbA1c 02/2011 was 6.6%  . Wolf-Parkinson-White syndrome   . Tobacco abuse     Quit  . BPH (benign prostatic hyperplasia)     with hx of acute prostatitis  . Elevated PSA 09/2011    with prostate nodule.  Prostate bx ALL BENIGN 10/10/11 (Dr. Alinda Money)  . Myocardial infarction [facility]) 2005    followed by Dr. Stanford Breed  . GERD (gastroesophageal reflux disease)     occasional  . Headache(784.0)     cluster headaches a long time ago  . Arthritis   . Thrombocytopenia ([place])     Stable 2012-2016  . Complication of anesthesia     agitation   . History of bronchitis   . History of vertigo   . Urinary tract infection   . Ear infection 06/17/2015    Medications:  Infusions:  . heparin 1,000 Units/hr (09/01/15 1120)    Assessment: 11 yom presented to the ED with CP. Troponin is elevated so pt to begin IV heparin. H/H is WNL and platelets are low but pt has a known history of thrombocytopenia. He is not on a anticoagulation PTA.    Goal of Therapy:  Heparin level 0.3-0.7 units/ml Monitor platelets by anticoagulation protocol: Yes   Plan:  - Heparin bolus 4000 units IV x 1 - Heparin gtt 1000 units/hr - Check a 6 hour heparin level - Daily heparin level and CBC  Kamonte Mcmichen, Rande Lawman 09/01/2015,11:22 AM

## 2015-09-01 NOTE — ED Notes (Signed)
Ok for pt to drink per EDP. Pt given water.

## 2015-09-01 NOTE — H&P (Signed)
Patient ID: Mark Blankenship MRN: [id number], DOB/AGE: 04-19-1956   Admit date: 09/01/2015   Primary Physician: Tammi Sou, MD Primary Cardiologist: Dr. Stanford Breed  Pt. Profile:  60 y/o male with h/o CAD s/p prior stenting to the Cx in 2005, HTN, HLD, T2DM and tobacco abuse who presents to the [facility] ED with a complaint of CP and a + troponin c/w NSTEMI.   Problem List  Past Medical History  Diagnosis Date  . History of bladder cancer 2012    Mass removed, plus gets ongoing BCG bladder infusions by urologist (Dr. Alinda Money)  . CAD (coronary artery disease)      '05 MI > DES to circumflex  . HTN (hypertension)   . Hyperlipidemia   . Diabetes mellitus type 2, controlled ([place])     HbA1c 02/2011 was 6.6%  . Wolf-Parkinson-White syndrome   . Tobacco abuse     Quit  . BPH (benign prostatic hyperplasia)     with hx of acute prostatitis  . Elevated PSA 09/2011    with prostate nodule.  Prostate bx ALL BENIGN 10/10/11 (Dr. Alinda Money)  . Myocardial infarction [facility]) 2005    followed by Dr. Stanford Breed  . GERD (gastroesophageal reflux disease)     occasional  . Headache(784.0)     cluster headaches a long time ago  . Arthritis   . Thrombocytopenia ([place])     Stable 2012-2016  . Complication of anesthesia     agitation   . History of bronchitis   . History of vertigo   . Urinary tract infection   . Ear infection 06/17/2015    Past Surgical History  Procedure Laterality Date  . Bladder tumor excision  11/08/10; 05/2015    urothelial carcinoma (Dr. Alinda Money).  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  . Myocardial perfusion study  2009    Neg ischemia, EF 52%  . Lexiscan  2012    Normal/negative  . Lumbar spine surgery  [facility] in [place] ([place])-no trouble since last surgery  . Colonoscopy  2010    Dr. Isa Rankin, repeat 2020.  Patient wants to go to different GI when time for repeat.  . Vasectomy    . Cardiac  catheterization  2005    with stent placement  . Transurethral resection of bladder tumor N/A 06/06/2013    Procedure: TRANSURETHRAL RESECTION OF BLADDER TUMOR (TURBT);  Surgeon: Dutch Gray, MD;  Location: [facility] ORS;  Service: Urology;  Laterality: N/A;.  PATH: squamous metaplasia, fibrosis with chronic inflammation, no malignancy.  . Cystoscopy with retrograde pyelogram, ureteroscopy and stent placement N/A 06/06/2013    Procedure: CYSTOSCOPY WITH RETROGRADE PYELOGRAM;  Surgeon: Dutch Gray, MD;  Location: [facility]S;  Service: Urology;  Laterality: N/A;  . Cystoscopy w/ retrogrades Bilateral 06/22/2015    Procedure: CYSTOSCOPY WITH RETROGRADE PYELOGRAM;  Surgeon: Raynelle Bring, MD;  Location: [facility];  Service: Urology;  Laterality: Bilateral;  . Transurethral resection of bladder tumor N/A 06/22/2015    Carcinoma in situ: no invasive malignancy.  Procedure: TRANSURETHRAL RESECTION OF BLADDER TUMOR (TURBT);  Surgeon: Raynelle Bring, MD;  Location: [facility];  Service: Urology;  Laterality: N/A;  **BLADDER BIOPSY*RESECTION**     Allergies  No Known Allergies  HPI  60 y/o male, followed by Dr. Stanford Breed, with a history of coronary disease status post drug-eluting stent to the circumflex in August 2005. His last Myoview was performed in May 2015. The patient's ejection fraction was 50%.  There was no sign of scar or ischemia. His other PMH is significant for HTN, HLD, T2DM, tobacco abuse, WPW syndrome and GERD. He is a chronic everyday smoker, smoking on average a half a pack per day. He has not had a cardiac catheterization since his initial PCI in 2005.  He presents to the [facility] ED with a complaint of chest pain and found to have an elevated troponin of 0.26. D-dimer is negative. He is now on IV heparin. Platelets are low at 113.   The patient describes left sided chest pressure radiating to his back. He first noticed symptoms about a week ago however the discomfort at that time was short-lived and resolved  spontaneously. This morning he had recurrent severe left-sided chest discomfort radiating to his back. It was a 10/10 in maximum intensity. He had mild associated dyspnea and nausea without vomiting. He denies diaphoresis, lightheadedness, syncope/ near-syncope. He does not recall the discomfort being any worse with exertion however, unlike last week, his discomfort failed to resolve on its own. His wife drove him to the ED for evaluation. He was given a GI cocktail as well as a dose of IV Toradol and his discomfort initially resolved. However, it later recurred. He continues to have mild 1/10 chest discomfort. He reports full medication compliance at home. EKG shows new TWIs in V1, new compared to prior EKG 11/2014. Also with mild hyperacute Twaves in V2-V4.   The patient notes that his recent discomfort is similar to when he presented in 2005, with presence of left upper back pain.    Home Medications  Prior to Admission medications   Medication Sig Start Date End Date Taking? Authorizing Provider  acetaminophen (TYLENOL) 500 MG tablet Take 1,000 mg by mouth every 6 (six) hours as needed for mild pain or headache.    Yes Historical Provider, MD  aspirin EC 81 MG tablet Take 81 mg by mouth daily.   Yes Historical Provider, MD  atenolol (TENORMIN) 50 MG tablet Take 1 tablet (50 mg total) by mouth daily with lunch. 05/20/15  Yes Tammi Sou, MD  glipiZIDE (GLUCOTROL XL) 10 MG 24 hr tablet TAKE ONE TABLET BY MOUTH EVERY DAY WITH BREAKFAST Patient taking differently: TAKE ONE TABLET BY MOUTH EVERY DAY WITH LUNCH 03/23/15  Yes Tammi Sou, MD  HYDROcodone-acetaminophen (NORCO) 5-325 MG tablet Take 1 tablet by mouth every 6 (six) hours as needed for moderate pain. 06/22/15  Yes Acie Fredrickson, MD  linagliptin (TRADJENTA) 5 MG TABS tablet Take 1 tablet (5 mg total) by mouth daily. 08/26/15  Yes Tammi Sou, MD  lisinopril (PRINIVIL,ZESTRIL) 20 MG tablet TAKE ONE TABLET BY MOUTH EVERY DAY WITH  LUNCH 03/23/15  Yes Tammi Sou, MD  metFORMIN (GLUCOPHAGE) 1000 MG tablet TAKE ONE TABLET BY MOUTH TWICE DAILY WITH A MEAL (NEED OFFICE VISIT FOR MORE REFILLS) Patient taking differently: TAKE ONE TABLET BY MOUTH TWICE DAILY WITH A MEAL 04/07/15  Yes Tammi Sou, MD  phenazopyridine (PYRIDIUM) 200 MG tablet Take 1 tablet (200 mg total) by mouth 3 (three) times daily as needed (burning with urination). 06/22/15  Yes Acie Fredrickson, MD  simvastatin (ZOCOR) 40 MG tablet Take 1 tablet (40 mg total) by mouth every evening. 05/22/15  Yes Tammi Sou, MD  tamsulosin (FLOMAX) 0.4 MG CAPS capsule TAKE 1 CAPSULE BY MOUTH EVERY DAY AFTER SUPPER 05/28/15  Yes Tammi Sou, MD  fluticasone (FLONASE) 50 MCG/ACT nasal spray Place 1 spray into both  nostrils daily. Patient taking differently: Place 1 spray into both nostrils daily as needed for allergies.  10/10/13   Tammi Sou, MD  zoster vaccine live, PF, (ZOSTAVAX) 16109 UNT/0.65ML injection Inject 19,400 Units into the skin once. 08/26/15   Tammi Sou, MD    Family History  Family History  Problem Relation Age of Onset  . Hypertension Mother   . Cancer Father     colon and prostate, mets to lungs.  . Hypertension Father   . Heart disease Brother     d. 74 MI  . Multiple sclerosis Brother   . Diabetes Brother   . Heart disease Paternal Uncle     Social History  Social History   Social History  . Marital Status: Married    Spouse Name: N/A  . Number of Children: N/A  . Years of Education: N/A   Occupational History  . Not on file.   Social History Main Topics  . Smoking status: Current Some Day Smoker -- 0.25 packs/day for 40 years    Types: Cigarettes    Last Attempt to Quit: 09/08/2013  . Smokeless tobacco: Never Used  . Alcohol Use: No  . Drug Use: No  . Sexual Activity: Not on file   Other Topics Concern  . Not on file   Social History Narrative   Married, 2 children, 4 grandchildren--[place].   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   Tob: 30 pack-yr hx, quit 08/2011.   No alcohol or drug use.   Active on farm but no formal exercise regimen.     Review of Systems General:  No chills, fever, night sweats or weight changes.  Cardiovascular:  +chest pain, - dyspnea on exertion, edema, orthopnea, palpitations, paroxysmal nocturnal dyspnea. Dermatological: No rash, lesions/masses Respiratory: No cough, dyspnea Urologic: No hematuria, dysuria Abdominal:   No nausea, vomiting, diarrhea, bright red blood per rectum, melena, or hematemesis Neurologic:  No visual changes, wkns, changes in mental status. All other systems reviewed and are otherwise negative except as noted above.  Physical Exam  Blood pressure 106/80, pulse 65, temperature 97.5 F (36.4 C), temperature source Oral, resp. rate 17, height 5\' 6"  (1.676 m), weight 191 lb (86.637 kg), SpO2 93 %.  General: Pleasant, NAD Psych: Normal affect. Neuro: Alert and oriented X 3. Moves all extremities spontaneously. HEENT: Normal  Neck: Supple without bruits or JVD. Lungs:  Resp regular and unlabored, CTA. Heart: RRR no s3, s4, or murmurs. Abdomen: Soft, non-tender, non-distended, BS + x 4.  Extremities: No clubbing, cyanosis or edema. DP/PT/Radials 2+ and equal bilaterally.  Labs  Troponin [facility] of Care Test)  Recent Labs  09/01/15 0747  TROPIPOC 0.03    Recent Labs  09/01/15 0959  TROPONINI 0.26*   Lab Results  Component Value Date   WBC 6.9 09/01/2015   HGB 15.9 09/01/2015   HCT 44.2 09/01/2015   MCV 87.5 09/01/2015   PLT 113* 09/01/2015    Recent Labs Lab 08/26/15 0859 09/01/15 0736  NA 138 134*  K 4.4 4.7  CL 102 101  CO2 26 21*  BUN 15 14  CREATININE 1.07 1.13  CALCIUM 9.7 9.5  PROT 7.1  --   BILITOT 0.7  --   ALKPHOS 61  --   ALT 21  --   AST 16  --   GLUCOSE 168* 287*  Lab  Results  Component Value Date   CHOL 125 08/26/2015   HDL 29.50* 08/26/2015   LDLCALC 66 08/26/2015   TRIG 148.0 08/26/2015   Lab Results  Component Value Date   DDIMER 0.41 09/01/2015     Radiology/Studies  Dg Chest 2 View  09/01/2015  CLINICAL DATA:  Left sided chest pain since 0500 today EXAM: CHEST  2 VIEW COMPARISON:  06/16/2015 FINDINGS: The heart size and mediastinal contours are within normal limits. Both lungs are clear. The visualized skeletal structures are unremarkable. IMPRESSION: No active cardiopulmonary disease. Electronically Signed   By: Kathreen Devoid   On: 09/01/2015 08:21    ECG  NSR with Twave abnormalities (borderline hyperacute Twaves v2-v4)   ASSESSMENT AND PLAN Principal Problem:   NSTEMI (non-ST elevated myocardial infarction) ([place]) Active Problems:   HYPERCHOLESTEROLEMIA, PURE   HYPERTENSION, BENIGN   CAD, NATIVE VESSEL   Tobacco abuse   Diabetes mellitus without complication ([place])    1. CAD/NSTEMI: Patient with a known history of CAD status post prior stenting of the circumflex in 2005. Also with multiple risk factors including chronic tobacco abuse, hypertension, hyperlipidemia and type 2 diabetes. He presents with chest pain consistent with unstable angina and initial lab troponin in the ED is abnormal is 0.26. This chest discomfort has now improved now at a level of 1/3 (down from 10/10). He is on IV heparin. VSS. BP is stable but borderline low. Will hold IV nitro for now given BP. We will admit and will continue to cycle cardiac enzymes x 3 to assess trend. Plan for [facility] +/- PCI this admission, as long as platelets remain stable. Hold Metformin and ACE-I for cath. Continue BB. Change to high potency statin. 2D echo to asess LVF.   2. T2DM: will hold metformin for cath. Will use SSI for coverage. Recent A1c was above goal at 7.9. His PCP is following and recently started Tradjenta.   3. HTN: BP is soft but stable. Will hold ACE-I for cath. Continue  BB.   4. Tobacco Abuse: Everyday smoker. He smokes half a pack per day. Smoking cessation strongly advised.  5. HLD: LDL recently checked by PCP 08/26/15. LDL is at goal of <70 mg/dL at 66 mg/dL. He has been on simvastatin 40 mg as an outpatient.    Signed, SIMMONS, Silas Flood, PA-C 09/01/2015, 11:36 AM As above, patient seen and examined. Briefly he is a 60 year old male with past medical history of coronary artery disease, diabetes mellitus, hypertension, hyperlipidemia, tobacco abuse with non-ST elevation myocardial infarction. Patient developed left chest pain at approximately 5:00 this morning. It radiated to his left upper extremity. No associated symptoms. He presented to the emergency room and his electrocardiogram shows sinus rhythm, hyperacute anterior T waves and inferior infarct. Troponin is abnormal. His pain improved initially with medications. However one hour ago returned and is now a 1-2/10. Follow-up electrocardiogram shows sinus rhythm with no ST changes.  Non-ST elevation myocardial infarction-Plan aspirin, heparin, statin and beta blocker. The patient has had return of his pain and his troponin is abnormal. Plan urgent cardiac catheterization. The risks and benefits were discussed and the patient agrees to proceed. Hold Glucophage for 48 hours following procedure.Counseled on discontinuing tobacco use. Kirk Ruths

## 2015-09-01 NOTE — Progress Notes (Signed)
TR BAND REMOVAL  LOCATION:   Right radial   DEFLATED PER PROTOCOL:    Yes.    TIME BAND OFF / DRESSING APPLIED: 1845pm     SITE UPON ARRIVAL:    Level 0  SITE AFTER BAND REMOVAL:    Level 0  CIRCULATION SENSATION AND MOVEMENT:    Within Normal Limits   Yes.    COMMENTS:   VS remain stable. Pt denies any discomfort at radial site.  Care instruction given

## 2015-09-01 NOTE — Progress Notes (Signed)
Site area: Right groin a 5 french arterial sheath was removed  Site Prior to Removal:  Level 0  Pressure Applied For 15 MINUTES    Minutes Beginning at 1720p Manual:   Yes.    Patient Status During Pull:  stable  Post Pull Groin Site:  Level 0  Post Pull Instructions Given:  Yes.    Post Pull Pulses Present:  Yes.    Dressing Applied:  Yes.    Comments:  VS remain stable during sheath pull

## 2015-09-01 NOTE — Interval H&P Note (Signed)
Cath Lab Visit (complete for each Cath Lab visit)  Clinical Evaluation Leading to the Procedure:   ACS: Yes.    Non-ACS:    Anginal Classification: CCS IV  Anti-ischemic medical therapy: Maximal Therapy (2 or more classes of medications)  Non-Invasive Test Results: No non-invasive testing performed  Prior CABG: No previous CABG      History and Physical Interval Note:  09/01/2015 3:19 PM  Mark Blankenship  has presented today for surgery, with the diagnosis of urgent  The various methods of treatment have been discussed with the patient and family. After consideration of risks, benefits and other options for treatment, the patient has consented to  Procedure(s): Left Heart Cath and Coronary Angiography (N/A) as a surgical intervention .  The patient's history has been reviewed, patient examined, no change in status, stable for surgery.  I have reviewed the patient's chart and labs.  Questions were answered to the patient's satisfaction.     Sinclair Grooms

## 2015-09-01 NOTE — ED Notes (Signed)
MD at bedside. 

## 2015-09-01 NOTE — Progress Notes (Signed)
Noted a re-bleed at right groin.  Additional manual pressure held for 10 mins and pressure dressing applied.  Right groin remains a level 0, with no discomfort per pt.

## 2015-09-01 NOTE — ED Notes (Signed)
Pt reports left sided chest pain that started when he woke up this morning. Also reports arm numbness. SOB at times.

## 2015-09-01 NOTE — Progress Notes (Signed)
ANTICOAGULATION CONSULT NOTE - Initial Consult  Pharmacy Consult for heparin Indication: chest pain/ACS  No Known Allergies  Patient Measurements: Height: 5\' 6"  (167.6 cm) Weight: 191 lb (86.637 kg) IBW/kg (Calculated) : 63.8 Heparin Dosing Weight: 81.8kg  Vital Signs: BP: 151/89 mmHg (02/07 1942) Pulse Rate: 54 (02/07 1942)  Labs:  Recent Labs  09/01/15 0736 09/01/15 0959 09/01/15 1302  HGB 15.9  --   --   HCT 44.2  --   --   PLT 113*  --   --   CREATININE 1.13  --   --   TROPONINI  --  0.26* 1.44*    Estimated Creatinine Clearance: 71.7 mL/min (by C-G formula based on Cr of 1.13).   Medications:  Infusions:  . sodium chloride 1 mL/kg/hr (09/01/15 1738)    Assessment: 87 yom presented to the ED with CP. H/H is WNL and platelets are low but pt has a known history of thrombocytopenia. He is not on a anticoagulation PTA.   Heparin to continue after cath resuming 8 hrs after sheath pull  Goal of Therapy:  Heparin level 0.3-0.7 units/ml Monitor platelets by anticoagulation protocol: Yes   Plan:  Resume 1000 units/hr of heparin @ 0100 Initial HL at 0800 Daily HL, CBC F/U heparin plan  Levester Fresh, PharmD, BCPS, [facility] Clinical Pharmacist Pager [phone] 09/01/2015 7:50 PM

## 2015-09-01 NOTE — ED Notes (Signed)
Pt ambulatory in steady gait to restroom.

## 2015-09-01 NOTE — Progress Notes (Signed)
CRITICAL VALUE ALERT  Critical value received:  Trop 8.92  Date of notification:  2/7  Time of notification:  2255  Critical value read back:Yes.    Nurse who received alert:  Lexi  MD notified (1st page):  Claiborne Billings  Time of first page:  2300  MD notified (2nd page):  Time of second page:  Responding MD:  Claiborne Billings  Time MD responded:  2300

## 2015-09-02 ENCOUNTER — Encounter (HOSPITAL_COMMUNITY): Payer: Self-pay | Admitting: Interventional Cardiology

## 2015-09-02 LAB — MRSA PCR SCREENING: MRSA BY PCR: NEGATIVE

## 2015-09-02 LAB — TROPONIN I
TROPONIN I: 12.58 ng/mL — AB (ref <0.031)
TROPONIN I: 10.73 ng/mL — AB (ref <0.031)
TROPONIN I: 5.8 ng/mL — AB (ref <0.031)
TROPONIN I: 4.8 ng/mL — AB (ref <0.031)

## 2015-09-02 LAB — CBC
HCT: 42.8 % (ref 39.0–52.0)
HEMOGLOBIN: 14.5 g/dL (ref 13.0–17.0)
MCH: 29.8 pg (ref 26.0–34.0)
MCHC: 33.9 g/dL (ref 30.0–36.0)
MCV: 88.1 fL (ref 78.0–100.0)
Platelets: 118 10*3/uL — ABNORMAL LOW (ref 150–400)
RBC: 4.86 MIL/uL (ref 4.22–5.81)
RDW: 13.6 % (ref 11.5–15.5)
WBC: 8.5 10*3/uL (ref 4.0–10.5)

## 2015-09-02 LAB — BASIC METABOLIC PANEL
Anion gap: 10 (ref 5–15)
BUN: 14 mg/dL (ref 6–20)
CALCIUM: 9 mg/dL (ref 8.9–10.3)
CO2: 23 mmol/L (ref 22–32)
Chloride: 105 mmol/L (ref 101–111)
Creatinine, Ser: 1.11 mg/dL (ref 0.61–1.24)
GFR calc Af Amer: >60 mL/min (ref >60)
GLUCOSE: 135 mg/dL — AB (ref 65–99)
Potassium: 3.9 mmol/L (ref 3.5–5.1)
SODIUM: 138 mmol/L (ref 135–145)

## 2015-09-02 LAB — LIPID PANEL
CHOLESTEROL: 113 mg/dL (ref 0–200)
HDL: 22 mg/dL — ABNORMAL LOW (ref >40)
LDL Cholesterol: 52 mg/dL (ref 0–99)
Total CHOL/HDL Ratio: 5.1 RATIO
Triglycerides: 195 mg/dL — ABNORMAL HIGH (ref <150)
VLDL: 39 mg/dL (ref 0–40)

## 2015-09-02 LAB — GLUCOSE, CAPILLARY: GLUCOSE-CAPILLARY: 89 mg/dL (ref 65–99)

## 2015-09-02 MED ORDER — CLOPIDOGREL BISULFATE 75 MG PO TABS
75.0000 mg | ORAL_TABLET | Freq: Every day | ORAL | Status: DC
  Administered 2015-09-02 – 2015-09-04 (×3): 75 mg via ORAL
  Filled 2015-09-02 (×3): qty 1

## 2015-09-02 MED ORDER — HEPARIN SODIUM (PORCINE) 5000 UNIT/ML IJ SOLN
5000.0000 [IU] | Freq: Three times a day (TID) | INTRAMUSCULAR | Status: DC
  Administered 2015-09-02 – 2015-09-03 (×4): 5000 [IU] via SUBCUTANEOUS
  Filled 2015-09-02 (×5): qty 1

## 2015-09-02 NOTE — Progress Notes (Signed)
    Subjective:  Denies CP or dyspnea   Objective:  Filed Vitals:   09/02/15 0310 09/02/15 0400 09/02/15 0500 09/02/15 0600  BP: 130/65 110/73 101/67 108/68  Pulse: 59 50 58 49  Temp:  98.6 F (37 C)    TempSrc:  Oral    Resp: 13 13 18 10   Height:      Weight:      SpO2: 95% 96% 94% 97%    Intake/Output from previous day:  Intake/Output Summary (Last 24 hours) at 09/02/15 0742 Last data filed at 09/02/15 0600  Gross per 24 hour  Intake 421.55 ml  Output    950 ml  Net -528.45 ml    Physical Exam: Physical exam: Well-developed well-nourished in no acute distress.  Skin is warm and dry.  HEENT is normal.  Neck is supple.  Chest is clear to auscultation with normal expansion.  Cardiovascular exam is regular rate and rhythm.  Abdominal exam nontender or distended. No masses palpated. Extremities show no edema. Radial cath site with no hematoma; right groin with no hematoma or bruit neuro grossly intact    Lab Results: Basic Metabolic Panel:  Recent Labs  09/01/15 0736 09/02/15 0345  NA 134* 138  K 4.7 3.9  CL 101 105  CO2 21* 23  GLUCOSE 287* 135*  BUN 14 14  CREATININE 1.13 1.11  CALCIUM 9.5 9.0   CBC:  Recent Labs  09/01/15 0736 09/02/15 0345  WBC 6.9 8.5  HGB 15.9 14.5  HCT 44.2 42.8  MCV 87.5 88.1  PLT 113* 118*   Cardiac Enzymes:  Recent Labs  09/01/15 1302 09/01/15 2130 09/02/15 0345  TROPONINI 1.44* 8.92* 12.58*     Assessment/Plan:  1 status post MI-cath revealed an occluded left anterior descending artery. This was treated medically per Dr. Tamala Julian. Patient is presently pain-free.  Plan aspirin, Plavix, statin, beta blocker and ACE inhibitor. Transfer to telemetry. 2 residual coronary artery disease-continue aspirin and statin. 3 tobacco abuse-patient counseled on discontinuing. 4 hyperlipidemia-continue statin. 5 hypertension-continue present blood pressure medications.  6 diabetes mellitus-Resume present medications at  discharge. Hold Glucophage for 48 hours following procedure. Follow CBG.  Kirk Ruths 09/02/2015, 7:42 AM

## 2015-09-02 NOTE — Progress Notes (Signed)
CARDIAC REHAB PHASE I   PRE:  Rate/Rhythm: 54 SB    BP: sitting 102/69    SaO2:   MODE:  Ambulation: 250 ft   POST:  Rate/Rhythm: 77 SR    BP: sitting 102/74     SaO2:   Tolerated well, no c/o. Pt generally quiet as well as family. HR dropped to 40s briefly in bed after walking, pt denied sx. Ed completed with pt and family. Voiced reception but did not offer much conversation. Pt stated "we will prob quit smoking" referring to him and his wife. Encouraged pt and wife to discuss a plan to quit and we can do more education tomorrow. Will refer to [facility]. [id number]   Mark Blankenship CES, ACSM 09/02/2015 10:58 AM

## 2015-09-02 NOTE — Progress Notes (Signed)
Inpatient Diabetes Program Recommendations  AACE/ADA: New Consensus Statement on Inpatient Glycemic Control (2015)  Target Ranges:  Prepandial:   less than 140 mg/dL      Peak postprandial:   less than 180 mg/dL (1-2 hours)      Critically ill patients:  140 - 180 mg/dL   Review of Glycemic Control  Diabetes history: DM 2 Outpatient Diabetes medications: Glipizide 10 mg Daily, Tradjenta 5 mg Daily, Metformin 1,000 mg BID Current orders for Inpatient glycemic control: None  Inpatient Diabetes Program Recommendations: Correction (SSI): Patient on 3 different oral DM meds at home. While inpatient, please consider starting Novolog Sensitive correction TID + HS coverage  Thanks,  Tama Headings RN, MSN, [facility] Inpatient Diabetes Coordinator Team Pager [phone] (8a-5p)

## 2015-09-02 NOTE — Progress Notes (Signed)
Pressure dressing removed from right groin site no bleeding, oozing, or ecchymosis noted around site.  Will continue to monitor.

## 2015-09-03 ENCOUNTER — Inpatient Hospital Stay (HOSPITAL_COMMUNITY): Payer: BLUE CROSS/BLUE SHIELD

## 2015-09-03 DIAGNOSIS — I2510 Atherosclerotic heart disease of native coronary artery without angina pectoris: Secondary | ICD-10-CM

## 2015-09-03 LAB — CBC
HCT: 43.8 % (ref 39.0–52.0)
Hemoglobin: 15.5 g/dL (ref 13.0–17.0)
MCH: 31.1 pg (ref 26.0–34.0)
MCHC: 35.4 g/dL (ref 30.0–36.0)
MCV: 88 fL (ref 78.0–100.0)
PLATELETS: 112 10*3/uL — AB (ref 150–400)
RBC: 4.98 MIL/uL (ref 4.22–5.81)
RDW: 13.6 % (ref 11.5–15.5)
WBC: 7 10*3/uL (ref 4.0–10.5)

## 2015-09-03 LAB — BASIC METABOLIC PANEL
Anion gap: 10 (ref 5–15)
BUN: 13 mg/dL (ref 6–20)
CALCIUM: 9.1 mg/dL (ref 8.9–10.3)
CO2: 24 mmol/L (ref 22–32)
CREATININE: 1.08 mg/dL (ref 0.61–1.24)
Chloride: 102 mmol/L (ref 101–111)
GFR calc non Af Amer: >60 mL/min (ref >60)
Glucose, Bld: 179 mg/dL — ABNORMAL HIGH (ref 65–99)
Potassium: 4.1 mmol/L (ref 3.5–5.1)
SODIUM: 136 mmol/L (ref 135–145)

## 2015-09-03 LAB — GLUCOSE, CAPILLARY
GLUCOSE-CAPILLARY: 161 mg/dL — AB (ref 65–99)
GLUCOSE-CAPILLARY: 149 mg/dL — AB (ref 65–99)
GLUCOSE-CAPILLARY: 151 mg/dL — AB (ref 65–99)
Glucose-Capillary: 217 mg/dL — ABNORMAL HIGH (ref 65–99)

## 2015-09-03 MED ORDER — GLIPIZIDE ER 10 MG PO TB24
10.0000 mg | ORAL_TABLET | Freq: Every day | ORAL | Status: DC
  Administered 2015-09-03 – 2015-09-04 (×2): 10 mg via ORAL
  Filled 2015-09-03 (×3): qty 1

## 2015-09-03 MED ORDER — INSULIN ASPART 100 UNIT/ML ~~LOC~~ SOLN
0.0000 [IU] | Freq: Three times a day (TID) | SUBCUTANEOUS | Status: DC
  Administered 2015-09-03: 5 [IU] via SUBCUTANEOUS
  Administered 2015-09-03: 2 [IU] via SUBCUTANEOUS
  Administered 2015-09-03 – 2015-09-04 (×2): 3 [IU] via SUBCUTANEOUS

## 2015-09-03 NOTE — Plan of Care (Signed)
Problem: Consults Goal: Tobacco Cessation referral if indicated Outcome: Completed/Met Date Met:  09/03/15 Discussed smoking cessation with patient  Problem: Activity: Goal: Ability to tolerate increased activity will improve Outcome: Completed/Met Date Met:  09/03/15 Ambulating with Cardiac Rehab and independently in hallways without chest pain or pressure.  Problem: Cardiac: Goal: Ability to achieve and maintain adequate cardiopulmonary perfusion will improve Outcome: Progressing Ambulating in hallways without complications.  Right groin with small hematoma and bruising, no pain on ambulation, no bruit auscultated.  2decho completed to assess EF.  Problem: Education: Goal: Understanding of cardiac disease, CV risk reduction, and recovery process will improve Outcome: Progressing Discussed smoking cessation with patient and need to keep CBG's controlled.

## 2015-09-03 NOTE — Progress Notes (Signed)
    Subjective:  Denies CP or dyspnea   Objective:  Filed Vitals:   09/02/15 1419 09/02/15 2105 09/03/15 0544 09/03/15 0825  BP: 104/66 108/74 112/73 110/74  Pulse:  63 67 71  Temp: 97.6 F (36.4 C) 98.4 F (36.9 C) 97.9 F (36.6 C)   TempSrc: Oral Oral Oral   Resp: 18 16    Height: 5\' 6"  (1.676 m)     Weight: 190 lb (86.183 kg)  186 lb 1.6 oz (84.414 kg)   SpO2: 95% 97% 95%     Intake/Output from previous day:  Intake/Output Summary (Last 24 hours) at 09/03/15 0834 Last data filed at 09/02/15 1700  Gross per 24 hour  Intake    960 ml  Output      0 ml  Net    960 ml    Physical Exam: Physical exam: Well-developed well-nourished in no acute distress.  Skin is warm and dry.  HEENT is normal.  Neck is supple.  Chest is clear to auscultation with normal expansion.  Cardiovascular exam is regular rate and rhythm.  Abdominal exam nontender or distended. No masses palpated. Extremities show no edema. Radial cath site with no hematoma; right groin with no hematoma or bruit; mildly ecchymotic  neuro grossly intact    Lab Results: Basic Metabolic Panel:  Recent Labs  09/02/15 0345 09/03/15 0709  NA 138 136  K 3.9 4.1  CL 105 102  CO2 23 24  GLUCOSE 135* 179*  BUN 14 13  CREATININE 1.11 1.08  CALCIUM 9.0 9.1   CBC:  Recent Labs  09/02/15 0345 09/03/15 0709  WBC 8.5 7.0  HGB 14.5 15.5  HCT 42.8 43.8  MCV 88.1 88.0  PLT 118* 112*   Cardiac Enzymes:  Recent Labs  09/02/15 0813 09/02/15 1430 09/02/15 1940  TROPONINI 10.73* 5.80* 4.80*     Assessment/Plan:  1 status post MI-cath revealed an occluded left anterior descending artery. This was treated medically per Dr. Tamala Julian. Patient is presently pain-free.  Plan continue aspirin, Plavix, statin, beta blocker and ACE inhibitor. Ambulate today. 2 residual coronary artery disease-continue aspirin and statin. 3 tobacco abuse-patient counseled on discontinuing. 4 hyperlipidemia-continue statin. 5  hypertension-continue present blood pressure medications.  6 diabetes mellitus-Resume present medications at discharge. Hold Glucophage for 48 hours following procedure. Follow CBG. Plan DC in AM if stable Kirk Ruths 09/03/2015, 8:34 AM

## 2015-09-03 NOTE — Progress Notes (Signed)
  Echocardiogram 2D Echocardiogram has been performed.  Mark Blankenship 09/03/2015, 12:19 PM

## 2015-09-03 NOTE — Progress Notes (Signed)
CARDIAC REHAB PHASE I   PRE:  Rate/Rhythm: 71 SR  BP:  Sitting: 130/81        SaO2: 96 RA  MODE:  Ambulation: 550 ft   POST:  Rate/Rhythm: 75 SR  BP:  Sitting: 131/88         SaO2: 98 RA  Pt states he ambulated approximately 300 ft this morning.  States he did have some L arm discomfort upon returning to his room. Pt ambulated again 550 ft on RA, independent, steady gait, tolerated well with no complaints, some mild DOE noted, pt denies. VSS. Pt with very flat affect. Reviewed yesterday's education, pt states he has no questions at this time. When asked about his plan for tobacco cessation, pt states "I'll probably quit." No other remarks. Pt to edge of bed after walk, call bell within reach. Encouraged additional ambulation as tolerated. Will follow as needed.     [id number] Lenna Sciara, RN, BSN 09/03/2015 10:29 AM

## 2015-09-04 ENCOUNTER — Encounter (HOSPITAL_COMMUNITY): Payer: Self-pay | Admitting: Physician Assistant

## 2015-09-04 DIAGNOSIS — I255 Ischemic cardiomyopathy: Secondary | ICD-10-CM

## 2015-09-04 DIAGNOSIS — I77810 Thoracic aortic ectasia: Secondary | ICD-10-CM | POA: Diagnosis present

## 2015-09-04 LAB — GLUCOSE, CAPILLARY: Glucose-Capillary: 163 mg/dL — ABNORMAL HIGH (ref 65–99)

## 2015-09-04 MED ORDER — CLOPIDOGREL BISULFATE 75 MG PO TABS: 75.0000 mg | ORAL_TABLET | Freq: Every day | ORAL | Status: DC

## 2015-09-04 MED ORDER — NITROGLYCERIN 0.4 MG SL SUBL: 0.4000 mg | SUBLINGUAL_TABLET | SUBLINGUAL | Status: DC | PRN

## 2015-09-04 MED ORDER — ATORVASTATIN CALCIUM 80 MG PO TABS: 80.0000 mg | ORAL_TABLET | Freq: Every evening | ORAL | Status: DC

## 2015-09-04 MED ORDER — LIVING BETTER WITH HEART FAILURE BOOK
Freq: Once | Status: AC
  Administered 2015-09-04: 11:00:00
  Filled 2015-09-04: qty 1

## 2015-09-04 NOTE — Progress Notes (Signed)
Discussed heart failure lifestyle with patient and wife. He understood changes that he needed to make and verbalized understanding. Pt d/c'd in stable condition via wheelchair to private vehicle with wife

## 2015-09-04 NOTE — Progress Notes (Signed)
CARDIAC REHAB PHASE I   Discussed HF, low sodium, CRPII with pt. Voiced understanding, asked appropriate questions. Sounds like pt plans to quit smoking but wife will prob keep smoking. Discussed the concerns of this. [id number]  Josephina Shih [place] CES, ACSM 09/04/2015 11:27 AM

## 2015-09-04 NOTE — Discharge Summary (Signed)
Discharge Summary    Patient ID: Mark Blankenship,  MRN: [id number], DOB/AGE: 08-10-1955 60 y.o.  Admit date: 09/01/2015 Discharge date: 09/04/2015  Primary Care Provider: MCGOWEN,PHILIP H Primary Cardiologist: Dr. Stanford Breed   Discharge Diagnoses    Principal Problem:   NSTEMI (non-ST elevated myocardial infarction) ([place]) Active Problems:   HYPERCHOLESTEROLEMIA, PURE   Essential hypertension   CAD, NATIVE VESSEL   Tobacco abuse   Diabetes mellitus ([place])   Cardiomyopathy, ischemic   Ascending aorta dilatation ([facility])   Allergies No Known Allergies  Diagnostic Studies/Procedures    1) Cardiac catheterization this admission, please see full report and below for summary. 2) 2D echo 09/03/15 - Left ventricle: The cavity size was normal. There was mild concentric hypertrophy. Systolic function was mildly to moderately reduced. The estimated ejection fraction was in the range of 40% to 45%. Diffuse hypokinesis. There is akinesis of the apical myocardium. Doppler parameters are consistent with abnormal left ventricular relaxation (grade 1 diastolic dysfunction). - Aortic valve: Trileaflet; mildly thickened, mildly calcified leaflets. - Aorta: Aortic root dimension: 38 mm (ED). - Ascending aorta: The ascending aorta was mildly dilated. - Right ventricle: The cavity size was mildly dilated. Wall thickness was normal. - Right atrium: The atrium was mildly dilated. _____________   History of Present Illness / Hospital Course    Mark Blankenship is a 60 y/o male with h/o CAD s/p prior stenting to the Cx in 2005, HTN, HLD, T2DM, WPW, GERD, thrombocytopenia, bladder cancer, and tobacco abuse who presented to [facility] 09/01/2015 with chest pain and positive troponin c/w NSTEMI. He was admitted with complaints of left-sided chest pressure radiating to his back. He had associated dyspnea and nausea. He was given a GI cocktail as well as a dose of IV Toradol and his discomfort  initially resolved. However, it later recurred. EKG showed new TWIs in V1 and mild hyperacute Twaves in V2-V4. He felt discomfort was similar to when he presented in 2005. D-dimer was negative. Troponin was elevated and he was admitted for management of NSTEMI. He was placed on IV heparin. He underwent LHC 09/01/15 showing total occlusion of the mid LAD along with 20% mid-distal Cx, 100% dCX, 40% D1, 50% pCx, 50% m-dRCA, 80% dRCA. At the time of catheterization the patient was totally asymptomatic and being 12 hours post symptom onset, PCI was not felt to be indicated. LV gram showed EF 40-50% with moderate anterior HK and apical akinesis. Troponin peak was 10.73. It was recommended to treat his coronary disease and ICM medically. Plavix was added. Statin was titrated. Metformin was held at least 48 hours post cath. Tobacco cessation was advised. F/u 2D Echo 09/03/15: mild LVH, EF 40-45%, diffuse HK, akinesis of apical myocardium, grade 1 DD, aortic root dimention 42mm, ascending aorta mildly dilated, mildly dilated RV/RA. The patient feels well today. Dr. Stanford Breed has seen and examined the patient today and feels he is stable for discharge. He was given a note for work asking him not to return until cleared in f/u if doing well.  If the patient is tolerating statin at time of follow-up appointment, would consider rechecking liver function/lipid panel in 6-8 weeks.  _____________  Discharge Vitals Blood pressure 126/84, pulse 61, temperature 98.2 F (36.8 C), temperature source Oral, resp. rate 18, height 5\' 6"  (1.676 m), weight 185 lb (83.915 kg), SpO2 95 %.  Filed Weights   09/02/15 1419 09/03/15 0544 09/04/15 0342  Weight: 190 lb (86.183 kg) 186 lb 1.6 oz (84.414 kg) 185  lb (83.915 kg)    Labs & Radiologic Studies     CBC  Recent Labs  09/02/15 0345 09/03/15 0709  WBC 8.5 7.0  HGB 14.5 15.5  HCT 42.8 43.8  MCV 88.1 88.0  PLT 118* [id number]*   Basic Metabolic Panel  Recent Labs  09/02/15 0345  09/03/15 0709  NA 138 136  K 3.9 4.1  CL 105 102  CO2 23 24  GLUCOSE 135* 179*  BUN 14 13  CREATININE 1.11 1.08  CALCIUM 9.0 9.1   Cardiac Enzymes  Recent Labs  09/02/15 0813 09/02/15 1430 09/02/15 1940  TROPONINI 10.73* 5.80* 4.80*   Fasting Lipid Panel  Recent Labs  09/02/15 0345  CHOL 113  HDL 22*  LDLCALC 52  TRIG 195*  CHOLHDL 5.1    Dg Chest 2 View  09/01/2015  CLINICAL DATA:  Left sided chest pain since 0500 today EXAM: CHEST  2 VIEW COMPARISON:  06/16/2015 FINDINGS: The heart size and mediastinal contours are within normal limits. Both lungs are clear. The visualized skeletal structures are unremarkable. IMPRESSION: No active cardiopulmonary disease. Electronically Signed   By: Kathreen Devoid   On: 09/01/2015 08:21    Disposition   Pt is being discharged home today in good condition.  Follow-up Plans & Appointments    Follow-up Information    Follow up with HAGER, BRYAN, PA-C.   Specialties:  Physician Assistant, Radiology, Interventional Cardiology   Why:  [facility] - 09/14/15 at [place] is one of the PAs that works with Dr. Stanford Breed.   Contact information:   [address] [place] [address] [phone]       Follow up with Kirk Ruths, MD.   Specialty:  Cardiology   Why:  [facility] - 12/03/15 at [place] information:   [address] [place] [address] [phone]      Discharge Instructions    Amb Referral to Cardiac Rehabilitation    Complete by:  As directed   Diagnosis:  Myocardial Infarction     Diet - low sodium heart healthy    Complete by:  As directed      Increase activity slowly    Complete by:  As directed   Medicine changes: Your simvastatin was stopped and changed to atorvastatin. New medicines include Plavix (daily) and nitroglycerin (as needed).  No driving for 1 week. No lifting over 10 lbs for 2 weeks. No sexual activity for 2 weeks. You may not return to work until  cleared at your follow-up appointment - please discuss. Keep procedure site clean & dry. If you notice increased pain, swelling, bleeding or pus, call/return!  You may shower, but no soaking baths/hot tubs/pools for 1 week.   One of your heart tests showed weakness of the heart muscle this admission. This may make you more susceptible to weight gain from fluid retention, which can lead to symptoms that we call heart failure. Please follow these instructions: 1. Follow a low-salt diet and watch your fluid intake. In general, you should not be taking in more than 2 liters of fluid per day (no more than 8 glasses per day). Some patients are restricted to less than 1.5 liters of fluid per day (no more than 6 glasses per day). This includes sources of water in foods like soup, coffee, tea, milk, etc. 2. Weigh yourself on the same scale at same time of day and keep a log. 3. Call your doctor: (Anytime you feel  any of the following symptoms)  - 3-4 pound weight gain in 1-2 days or 2 pounds overnight  - Shortness of breath, with or without a dry hacking cough  - Swelling in the hands, feet or stomach  - If you have to sleep on extra pillows at night in order to breathe  IT IS IMPORTANT TO LET YOUR DOCTOR KNOW EARLY ON IF YOU ARE HAVING SYMPTOMS SO WE CAN HELP YOU!           Discharge Medications   Current Discharge Medication List    START taking these medications   Details  atorvastatin (LIPITOR) 80 MG tablet Take 1 tablet (80 mg total) by mouth every evening. Qty: 30 tablet, Refills: 6    clopidogrel (PLAVIX) 75 MG tablet Take 1 tablet (75 mg total) by mouth daily. Qty: 30 tablet, Refills: 6    nitroGLYCERIN (NITROSTAT) 0.4 MG SL tablet Place 1 tablet (0.4 mg total) under the tongue every 5 (five) minutes as needed for chest pain (up to 3 doses). Qty: 25 tablet, Refills: 3      CONTINUE these medications which have NOT CHANGED   Details  acetaminophen (TYLENOL) 500 MG tablet Take 1,000 mg  by mouth every 6 (six) hours as needed for mild pain or headache.     aspirin EC 81 MG tablet Take 81 mg by mouth daily.    atenolol (TENORMIN) 50 MG tablet Take 1 tablet (50 mg total) by mouth daily with lunch.     glipiZIDE (GLUCOTROL XL) 10 MG 24 hr tablet TAKE ONE TABLET BY MOUTH EVERY DAY WITH BREAKFAST     HYDROcodone-acetaminophen (NORCO) 5-325 MG tablet Take 1 tablet by mouth every 6 (six) hours as needed for moderate pain.     linagliptin (TRADJENTA) 5 MG TABS tablet Take 1 tablet (5 mg total) by mouth daily.     lisinopril (PRINIVIL,ZESTRIL) 20 MG tablet TAKE ONE TABLET BY MOUTH EVERY DAY WITH LUNCH     metFORMIN (GLUCOPHAGE) 1000 MG tablet TAKE ONE TABLET BY MOUTH TWICE DAILY WITH A MEAL (NEED OFFICE VISIT FOR MORE REFILLS)     phenazopyridine (PYRIDIUM) 200 MG tablet Take 1 tablet (200 mg total) by mouth 3 (three) times daily as needed (burning with urination).     tamsulosin (FLOMAX) 0.4 MG CAPS capsule TAKE 1 CAPSULE BY MOUTH EVERY DAY AFTER SUPPER     fluticasone (FLONASE) 50 MCG/ACT nasal spray Place 1 spray into both nostrils daily.       STOP taking these medications     simvastatin (ZOCOR) 40 MG tablet      zoster vaccine live, PF, (ZOSTAVAX) 36644 UNT/0.65ML injection          Aspirin prescribed at discharge:  Yes High Intensity Statin Prescribed? (Lipitor 40-80mg  or Crestor 20-40mg ): Yes Beta Blocker Prescribed: Yes For EF 45% or less, Was ACEI/ARB Prescribed? Yes ADP Receptor Inhibitor Prescribed? (i.e. Plavix etc.-Includes Medically Managed Patients): Yes For EF <40%, Aldosterone Inhibitor Prescribed? No: BP occasionally running lower Was EF assessed during THIS hospitalization? Yes Was Cardiac Rehab II ordered? (Included Medically managed Patients): Yes   Outstanding Labs/Studies   N/A  Duration of Discharge Encounter   Greater than 30 minutes including physician time.  Signed, Charlie Pitter PA-C 09/04/2015, 10:36 AM

## 2015-09-04 NOTE — Progress Notes (Signed)
    Subjective:  Denies CP or dyspnea   Objective:  Filed Vitals:   09/03/15 0825 09/03/15 1300 09/03/15 2057 09/04/15 0342  BP: 110/74 117/70 129/79 107/80  Pulse: 71 63 60 61  Temp:  97.5 F (36.4 C) 98.3 F (36.8 C) 98.2 F (36.8 C)  TempSrc:  Oral Oral Oral  Resp:  18 18 18   Height:      Weight:    185 lb (83.915 kg)  SpO2:  97% 99% 95%    Intake/Output from previous day:  Intake/Output Summary (Last 24 hours) at 09/04/15 [id number] Last data filed at 09/03/15 2059  Gross per 24 hour  Intake    720 ml  Output    800 ml  Net    -80 ml    Physical Exam: Physical exam: Well-developed well-nourished in no acute distress.  Skin is warm and dry.  HEENT is normal.  Neck is supple.  Chest is clear to auscultation with normal expansion.  Cardiovascular exam is regular rate and rhythm.  Abdominal exam nontender or distended. No masses palpated. Extremities show no edema. Radial cath site with no hematoma; right groin with no hematoma or bruit; mildly ecchymotic  neuro grossly intact    Lab Results: Basic Metabolic Panel:  Recent Labs  09/02/15 0345 09/03/15 0709  NA 138 136  K 3.9 4.1  CL 105 102  CO2 23 24  GLUCOSE 135* 179*  BUN 14 13  CREATININE 1.11 1.08  CALCIUM 9.0 9.1   CBC:  Recent Labs  09/02/15 0345 09/03/15 0709  WBC 8.5 7.0  HGB 14.5 15.5  HCT 42.8 43.8  MCV 88.1 88.0  PLT 118* 112*   Cardiac Enzymes:  Recent Labs  09/02/15 0813 09/02/15 1430 09/02/15 1940  TROPONINI 10.73* 5.80* 4.80*     Assessment/Plan:  1 status post MI-cath revealed an occluded left anterior descending artery. This was treated medically per Dr. Tamala Julian. Patient is presently pain-free.  Echo with EF 40-45. Plan continue aspirin, Plavix, statin, beta blocker and ACE inhibitor. 2 residual coronary artery disease-continue aspirin and statin. 3 tobacco abuse-patient counseled on discontinuing. 4 hyperlipidemia-continue statin. 5 hypertension-continue present  blood pressure medications.  6 diabetes mellitus-Resume present medications at discharge.  Plan DC today and fu TOC 1 week and me 3 months >30 min PA and physician time D2  Kirk Ruths 09/04/2015, 8:11 AM

## 2015-09-14 ENCOUNTER — Ambulatory Visit (INDEPENDENT_AMBULATORY_CARE_PROVIDER_SITE_OTHER): Payer: BLUE CROSS/BLUE SHIELD | Admitting: Physician Assistant

## 2015-09-14 ENCOUNTER — Encounter: Payer: Self-pay | Admitting: Physician Assistant

## 2015-09-14 VITALS — BP 110/66 | HR 57 | Ht 66.0 in | Wt 185.7 lb

## 2015-09-14 DIAGNOSIS — I2510 Atherosclerotic heart disease of native coronary artery without angina pectoris: Secondary | ICD-10-CM

## 2015-09-14 DIAGNOSIS — E785 Hyperlipidemia, unspecified: Secondary | ICD-10-CM

## 2015-09-14 DIAGNOSIS — E7800 Pure hypercholesterolemia, unspecified: Secondary | ICD-10-CM

## 2015-09-14 DIAGNOSIS — I255 Ischemic cardiomyopathy: Secondary | ICD-10-CM

## 2015-09-14 DIAGNOSIS — F17201 Nicotine dependence, unspecified, in remission: Secondary | ICD-10-CM

## 2015-09-14 DIAGNOSIS — Z79899 Other long term (current) drug therapy: Secondary | ICD-10-CM

## 2015-09-14 DIAGNOSIS — I10 Essential (primary) hypertension: Secondary | ICD-10-CM

## 2015-09-14 NOTE — Patient Instructions (Signed)
Medication Instructions: Please continue your current medications. No changes were made today.  Labwork: Your physician recommends that you return for lab work in 81 weeks - FASTING.  Testing/Procedures: NONE  Follow-up: Please follow-up with Dr Stanford Breed as scheduled.  If you need a refill on your cardiac medications before your next appointment, please call your pharmacy.

## 2015-09-14 NOTE — Progress Notes (Signed)
Patient ID: Mark Blankenship, male   DOB: May 31, 1956, 60 y.o.   MRN: [id number]    Date:  09/14/2015   ID:  Mark Blankenship, DOB Apr 02, 1956, MRN [id number]  PCP:  Tammi Sou, MD  Primary Cardiologist:  Stanford Breed  Chief Complaint  Patient presents with  . Hospitalization Follow-up    patient reports chest pain - "dull, ache," otherwise, no complaints     History of Present Illness: Mark Blankenship is a 60 y.o. male with history or CAD s/p prior stenting to the Cx in 2005, HTN, HLD, T2DM, WPW, GERD, thrombocytopenia, bladder cancer, and tobacco abuse who presented to [facility] 09/01/2015 with chest pain and positive troponin c/w NSTEMI.  EKG showed new TWIs in V1 and mild hyperacute Twaves in V2-V4. He felt discomfort was similar to when he presented in 2005. D-dimer was negative.  He underwent LHC 09/01/15 showing total occlusion of the mid LAD along with 20% mid-distal Cx, 100% dCX, 40% D1, 50% pCx, 50% m-dRCA, 80% dRCA.  At the time of catheterization the patient was totally asymptomatic and being 12 hours post symptom onset, PCI was not felt to be indicated. LV gram showed EF 40-50% with moderate anterior HK and apical akinesis. Troponin peak was 10.73. It was recommended to treat his coronary disease and ICM medically. Plavix was added. Statin was titrated. Metformin was held at least 48 hours post cath. Tobacco cessation was advised. F/u 2D Echo 09/03/15: mild LVH, EF 40-45%, diffuse HK, akinesis of apical myocardium, grade 1 DD, aortic root dimention 87mm, ascending aorta mildly dilated, mildly dilated RV/RA.  He was given a note for work asking him not to return until cleared in f/u if doing well.  We'll follow-up. Up until yesterday he has not had any problems. However, yesterday he had 30 minutes of chest pain which was 2-3 out of 10 in intensity. It resolved spontaneously and he hasn't had a problem since. 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 He had no  chest pain at that time. No other symptoms associated with this chest pain yesterday. He has had some abdominal pain which is relating to bladder cancer for which she's recently finished up treatment.  His been tracking his weight and all his calorie intake as far as fats and carbs.   The patient currently denies nausea, vomiting, fever, shortness of breath, orthopnea, dizziness, PND, cough, congestion,  hematochezia, melena, lower extremity edema, claudication.  Wt Readings from Last 3 Encounters:  09/14/15 185 lb 11.2 oz (84.233 kg)  09/04/15 185 lb (83.915 kg)  08/26/15 191 lb (86.637 kg)     Past Medical History  Diagnosis Date  . History of bladder cancer 2012    Mass removed, plus gets ongoing BCG bladder infusions by urologist (Dr. Alinda Money)  . CAD (coronary artery disease)     a. '05 MI > DES to circumflex. b. NSTEMI 08/2015 with total occlusion of the mid LAD along with 20% mid-distal Cx, 100% dCX, 40% D1, 50% pCx, 50% m-dRCA, 80% dRCA.Given >12 hours out from symptom onset, medical therapy recommended.  Marland Kitchen HTN (hypertension)   . Hyperlipidemia   . Diabetes mellitus type 2, controlled ([place])     HbA1c 02/2011 was 6.6%  . Wolf-Parkinson-White syndrome   . Tobacco abuse   . BPH (benign prostatic hyperplasia)     with hx of acute prostatitis  . Elevated PSA 09/2011    with prostate nodule.  Prostate bx ALL BENIGN 10/10/11 (  Dr. Alinda Money)  . Myocardial infarction [facility]) 2005    followed by Dr. Stanford Breed  . GERD (gastroesophageal reflux disease)     occasional  . Headache(784.0)     cluster headaches a long time ago  . Arthritis   . Thrombocytopenia ([place])     Stable 2012-2016  . Complication of anesthesia     agitation   . History of bronchitis   . History of vertigo   . Urinary tract infection   . Ear infection 06/17/2015  . Ascending aorta dilatation ([facility])     a. Ascending aorta mildly dilated by echo 09/03/15.  . Ischemic cardiomyopathy     a. 2D Echo 09/03/15: mild LVH, EF 40-45%,  diffuse HK, akinesis of apical myocardium, grade 1 DD, aortic root dimention 33mm, ascending aorta mildly dilated, mildly dilated RV/RA.     Current Outpatient Prescriptions  Medication Sig Dispense Refill  . acetaminophen (TYLENOL) 500 MG tablet Take 1,000 mg by mouth every 6 (six) hours as needed for mild pain or headache.     Marland Kitchen aspirin EC 81 MG tablet Take 81 mg by mouth daily.    Marland Kitchen atenolol (TENORMIN) 50 MG tablet Take 1 tablet (50 mg total) by mouth daily with lunch. 90 tablet 3  . atorvastatin (LIPITOR) 80 MG tablet Take 1 tablet (80 mg total) by mouth every evening. 30 tablet 6  . clopidogrel (PLAVIX) 75 MG tablet Take 1 tablet (75 mg total) by mouth daily. 30 tablet 6  . fluticasone (FLONASE) 50 MCG/ACT nasal spray Place 1 spray into both nostrils daily. (Patient taking differently: Place 1 spray into both nostrils daily as needed for allergies. ) 16 g 3  . glipiZIDE (GLUCOTROL XL) 10 MG 24 hr tablet TAKE ONE TABLET BY MOUTH EVERY DAY WITH BREAKFAST (Patient taking differently: TAKE ONE TABLET BY MOUTH EVERY DAY WITH LUNCH) 90 tablet 1  . HYDROcodone-acetaminophen (NORCO) 5-325 MG tablet Take 1 tablet by mouth every 6 (six) hours as needed for moderate pain. 21 tablet 0  . lisinopril (PRINIVIL,ZESTRIL) 20 MG tablet TAKE ONE TABLET BY MOUTH EVERY DAY WITH LUNCH 90 tablet 1  . metFORMIN (GLUCOPHAGE) 1000 MG tablet TAKE ONE TABLET BY MOUTH TWICE DAILY WITH A MEAL (NEED OFFICE VISIT FOR MORE REFILLS) (Patient taking differently: TAKE ONE TABLET BY MOUTH TWICE DAILY WITH A MEAL) 180 tablet 1  . nitroGLYCERIN (NITROSTAT) 0.4 MG SL tablet Place 1 tablet (0.4 mg total) under the tongue every 5 (five) minutes as needed for chest pain (up to 3 doses). 25 tablet 3  . phenazopyridine (PYRIDIUM) 200 MG tablet Take 1 tablet (200 mg total) by mouth 3 (three) times daily as needed (burning with urination). 21 tablet 0  . tamsulosin (FLOMAX) 0.4 MG CAPS capsule TAKE 1 CAPSULE BY MOUTH EVERY DAY AFTER SUPPER  90 capsule 1   No current facility-administered medications for this visit.    Allergies:   No Known Allergies  Social History:  The patient  reports that he has been smoking Cigarettes.  He has a 10 pack-year smoking history. He has never used smokeless tobacco. He reports that he does not drink alcohol or use illicit drugs.   Family history:   Family History  Problem Relation Age of Onset  . Hypertension Mother   . Cancer Father     colon and prostate, mets to lungs.  . Hypertension Father   . Heart disease Brother     d. 75 MI  . Multiple sclerosis Brother   .  Diabetes Brother   . Heart disease Paternal Uncle     ROS:  Please see the history of present illness.  All other systems reviewed and negative.   PHYSICAL EXAM: VS:  BP 110/66 mmHg  Pulse 57  Ht 5\' 6"  (1.676 m)  Wt 185 lb 11.2 oz (84.233 kg)  BMI 29.99 kg/m2 Well nourished, well developed, in no acute distress HEENT: Pupils are equal round react to light accommodation extraocular movements are intact.  Neck: no JVDNo cervical lymphadenopathy. Cardiac: Regular rate and rhythm without murmurs rubs or gallops. Lungs:  clear to auscultation bilaterally, no wheezing, rhonchi or rales Abd: soft, nontender, positive bowel sounds all quadrants, no hepatosplenomegaly Ext: no lower extremity edema.  2+ radial and dorsalis pedis pulses. Skin: warm and dry Neuro:  Grossly normal  EKG:  Sinus bradycardia rate 57 bpm. Deep T-wave inversions on the 2,3,4,5  ASSESSMENT AND PLAN:  Problem List Items Addressed This Visit    Tobacco dependence in remission   Hyperlipidemia   HYPERCHOLESTEROLEMIA, PURE   Essential hypertension   Cardiomyopathy, ischemic   CAD, NATIVE VESSEL   Relevant Orders   EKG 12-Lead    Other Visit Diagnoses    Dyslipidemia    -  Primary    Relevant Orders    Hepatic function panel    Lipid panel    Medication management        Relevant Orders    Hepatic function panel    Lipid panel        Overall the patient appears to be doing well. His weight is stable pressures control. Does have T-wave inversions on his EKG which were not there before however, the previous EKG was the day of his arrival to the hospital and I suspect he occluded his circumflex right after that point.. He had one episode of chest pain yesterday which lasted 30 minutes and he was skeptical about using his nitroglycerin because his blood pressure dropped when he did last time prior to admission. 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 If he has another episode, he will call immediately. At that time we can consider starting Imdur 15 mg.  We'll continue his high-dose of statin and check his LFTs and lipids in 4 weeks.  He continues to be tobacco free. He is taking aspirin, atenolol 50 mg, Plavix and lisinopril 20 mg. He is weighing himself daily and watching his salt intake as well.

## 2015-09-17 ENCOUNTER — Encounter: Payer: Self-pay | Admitting: Physician Assistant

## 2015-09-18 ENCOUNTER — Encounter: Payer: Self-pay | Admitting: *Deleted

## 2015-09-18 ENCOUNTER — Telehealth: Payer: Self-pay | Admitting: *Deleted

## 2015-09-18 NOTE — Telephone Encounter (Signed)
Cardiac rehab orders faxed.

## 2015-09-21 ENCOUNTER — Other Ambulatory Visit: Payer: Self-pay | Admitting: *Deleted

## 2015-09-21 MED ORDER — LISINOPRIL 20 MG PO TABS: ORAL_TABLET | ORAL | Status: DC

## 2015-09-21 MED ORDER — GLIPIZIDE ER 10 MG PO TB24: ORAL_TABLET | ORAL | Status: DC

## 2015-09-21 NOTE — Telephone Encounter (Signed)
LOV: 08/26/15 NOV: 12/28/15  RF request for glipizide Last written: 03/23/15 #90 w/ 1RF  RF request for lisinopril Last written: 03/23/15 #90 w/ 1RF

## 2015-09-24 ENCOUNTER — Telehealth: Payer: Self-pay | Admitting: Cardiology

## 2015-09-25 NOTE — Telephone Encounter (Signed)
Close encounter 

## 2015-10-08 ENCOUNTER — Telehealth (HOSPITAL_COMMUNITY): Payer: Self-pay | Admitting: *Deleted

## 2015-10-08 NOTE — Telephone Encounter (Signed)
Contacted pt to sign up for cardiac rehab.  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 Pt declines to participate in rehab at this time. Pt advised should his needs change, please give [place] a call. Cherre Huger, BSN

## 2015-10-09 ENCOUNTER — Other Ambulatory Visit: Payer: Self-pay | Admitting: *Deleted

## 2015-10-09 MED ORDER — METFORMIN HCL 1000 MG PO TABS: ORAL_TABLET | ORAL | Status: DC

## 2015-10-09 NOTE — Telephone Encounter (Signed)
RF request for metformin LOV: 08/26/15 Next ov: 12/28/15 Last written: 04/07/15 #180 w/ 1RF

## 2015-10-16 ENCOUNTER — Encounter: Payer: Self-pay | Admitting: Family Medicine

## 2015-10-19 ENCOUNTER — Other Ambulatory Visit: Payer: Self-pay | Admitting: Physician Assistant

## 2015-10-19 LAB — HEPATIC FUNCTION PANEL
ALBUMIN: 4.5 g/dL (ref 3.6–5.1)
ALK PHOS: 59 U/L (ref 40–115)
ALT: 20 U/L (ref 9–46)
AST: 20 U/L (ref 10–35)
BILIRUBIN INDIRECT: 0.7 mg/dL (ref 0.2–1.2)
BILIRUBIN TOTAL: 0.9 mg/dL (ref 0.2–1.2)
Bilirubin, Direct: 0.2 mg/dL (ref <=0.2)
Total Protein: 6.6 g/dL (ref 6.1–8.1)

## 2015-10-19 LAB — LIPID PANEL
Cholesterol: 80 mg/dL — ABNORMAL LOW (ref 125–200)
HDL: 24 mg/dL — AB (ref >=40)
LDL CALC: 41 mg/dL (ref <130)
Total CHOL/HDL Ratio: 3.3 Ratio (ref <=5.0)
Triglycerides: 75 mg/dL (ref <150)
VLDL: 15 mg/dL (ref <30)

## 2015-10-22 ENCOUNTER — Other Ambulatory Visit: Payer: Self-pay | Admitting: *Deleted

## 2015-10-22 MED ORDER — FLUTICASONE PROPIONATE 50 MCG/ACT NA SUSP: 1.0000 | Freq: Every day | NASAL | Status: DC | PRN

## 2015-10-22 NOTE — Telephone Encounter (Signed)
RF request for flonase LOV: 08/26/15 Next ov: 12/28/15 Last written: 10/10/13 #16g w/ 3RF

## 2015-10-28 DIAGNOSIS — Z0000 Encounter for general adult medical examination without abnormal findings: Secondary | ICD-10-CM | POA: Diagnosis not present

## 2015-10-28 DIAGNOSIS — D090 Carcinoma in situ of bladder: Secondary | ICD-10-CM | POA: Diagnosis not present

## 2015-10-28 DIAGNOSIS — C678 Malignant neoplasm of overlapping sites of bladder: Secondary | ICD-10-CM | POA: Diagnosis not present

## 2015-10-28 DIAGNOSIS — Z5111 Encounter for antineoplastic chemotherapy: Secondary | ICD-10-CM | POA: Diagnosis not present

## 2015-11-11 DIAGNOSIS — C678 Malignant neoplasm of overlapping sites of bladder: Secondary | ICD-10-CM | POA: Diagnosis not present

## 2015-11-11 DIAGNOSIS — Z5111 Encounter for antineoplastic chemotherapy: Secondary | ICD-10-CM | POA: Diagnosis not present

## 2015-11-11 DIAGNOSIS — Z0000 Encounter for general adult medical examination without abnormal findings: Secondary | ICD-10-CM | POA: Diagnosis not present

## 2015-11-18 DIAGNOSIS — Z0000 Encounter for general adult medical examination without abnormal findings: Secondary | ICD-10-CM | POA: Diagnosis not present

## 2015-11-18 DIAGNOSIS — Z5111 Encounter for antineoplastic chemotherapy: Secondary | ICD-10-CM | POA: Diagnosis not present

## 2015-11-18 DIAGNOSIS — C678 Malignant neoplasm of overlapping sites of bladder: Secondary | ICD-10-CM | POA: Diagnosis not present

## 2015-11-20 ENCOUNTER — Other Ambulatory Visit: Payer: Self-pay | Admitting: *Deleted

## 2015-11-20 MED ORDER — TAMSULOSIN HCL 0.4 MG PO CAPS: ORAL_CAPSULE | ORAL | Status: DC

## 2015-11-20 NOTE — Telephone Encounter (Signed)
RF request for tamsulosin LOV: 08/26/15 Next ov: 01/06/16 Last written: 05/28/15 #90 w/ 1RF

## 2015-11-26 NOTE — Progress Notes (Signed)
HPI: FU coronary disease status post drug-eluting stent to the circumflex in August 2005. Patient recently admitted with myocardial infarction. Cardiac catheterization February 2017 showed an occluded distal circumflex. The mid to distal LAD was occluded as well. Ejection fraction 40-50%. Patient was treated medically because of late presentation. Echocardiogram February 2017 showed ejection fraction 40-45% with akinesis of the apical myocardium. Mild right atrial and right ventricular enlargement. Since I last saw him, the patient denies any dyspnea on exertion, orthopnea, PND, pedal edema, palpitations, syncope or chest pain.   Current Outpatient Prescriptions  Medication Sig Dispense Refill  . acetaminophen (TYLENOL) 500 MG tablet Take 1,000 mg by mouth every 6 (six) hours as needed for mild pain or headache.     Marland Kitchen aspirin EC 81 MG tablet Take 81 mg by mouth daily.    Marland Kitchen atenolol (TENORMIN) 50 MG tablet Take 1 tablet (50 mg total) by mouth daily with lunch. 90 tablet 3  . atorvastatin (LIPITOR) 80 MG tablet Take 1 tablet (80 mg total) by mouth every evening. 30 tablet 6  . clopidogrel (PLAVIX) 75 MG tablet Take 1 tablet (75 mg total) by mouth daily. 30 tablet 6  . fluticasone (FLONASE) 50 MCG/ACT nasal spray Place 1 spray into both nostrils daily as needed for allergies. 16 g 3  . glipiZIDE (GLUCOTROL XL) 10 MG 24 hr tablet TAKE ONE TABLET BY MOUTH EVERY DAY WITH LUNCH 90 tablet 1  . lisinopril (PRINIVIL,ZESTRIL) 20 MG tablet TAKE ONE TABLET BY MOUTH EVERY DAY WITH LUNCH 90 tablet 3  . metFORMIN (GLUCOPHAGE) 1000 MG tablet TAKE ONE TABLET BY MOUTH TWICE DAILY WITH A MEAL 180 tablet 1  . nitroGLYCERIN (NITROSTAT) 0.4 MG SL tablet Place 1 tablet (0.4 mg total) under the tongue every 5 (five) minutes as needed for chest pain (up to 3 doses). 25 tablet 3  . phenazopyridine (PYRIDIUM) 200 MG tablet Take 1 tablet (200 mg total) by mouth 3 (three) times daily as needed (burning with urination).  21 tablet 0  . tamsulosin (FLOMAX) 0.4 MG CAPS capsule TAKE 1 CAPSULE BY MOUTH EVERY DAY AFTER SUPPER 90 capsule 1   No current facility-administered medications for this visit.     Past Medical History  Diagnosis Date  . History of bladder cancer 2012    Mass removed, plus gets ongoing BCG bladder infusions by urologist (Dr. Alinda Money).  Recurrence documented on cysto + bladder washings 10/14/15--needs another surgery.  Marland Kitchen CAD (coronary artery disease)     a. '05 MI > DES to circumflex. b. NSTEMI 08/2015 with total occlusion of the mid LAD along with 20% mid-distal Cx, 100% dCX, 40% D1, 50% pCx, 50% m-dRCA, 80% dRCA.Given >12 hours out from symptom onset, medical therapy recommended.  Marland Kitchen HTN (hypertension)   . Hyperlipidemia   . Diabetes mellitus type 2, controlled ([place])     HbA1c 02/2011 was 6.6%  . Wolf-Parkinson-White syndrome   . Tobacco abuse   . BPH (benign prostatic hyperplasia)     with hx of acute prostatitis  . Elevated PSA 09/2011    with prostate nodule.  Prostate bx ALL BENIGN 10/10/11 (Dr. Alinda Money)  . Myocardial infarction [facility]) 2005; 08/2015    followed by Dr. Stanford Breed  . GERD (gastroesophageal reflux disease)     occasional  . Headache(784.0)     cluster headaches a long time ago  . Arthritis   . Thrombocytopenia ([place])     Stable 2012-2016  . Complication of anesthesia  agitation   . History of bronchitis   . History of vertigo   . Urinary tract infection   . Ear infection 06/17/2015  . Ascending aorta dilatation (HCC)     a. Ascending aorta mildly dilated by echo 09/03/15.  . Ischemic cardiomyopathy     a. 2D Echo 09/03/15: mild LVH, EF 40-45%, diffuse HK, akinesis of apical myocardium, grade 1 DD, aortic root dimention 62mm, ascending aorta mildly dilated, mildly dilated RV/RA.     Past Surgical History  Procedure Laterality Date  . Bladder tumor excision  11/08/10; 05/2015    urothelial carcinoma (Dr. Alinda Money).  2016 high grade urothelial dysplasia (carcinoma in  situ), no invasive malignancy.  . Myocardial perfusion study  2009    Neg ischemia, EF 52%  . Lexiscan  2012    Normal/negative  . Lumbar spine surgery  [place] in [place] ([place])-no trouble since last surgery  . Colonoscopy  2010    Dr. Isa Rankin, repeat 2020.  Patient wants to go to different GI when time for repeat.  . Vasectomy    . Cardiac catheterization  2005    with stent placement  . Transurethral resection of bladder tumor N/A 06/06/2013    Procedure: TRANSURETHRAL RESECTION OF BLADDER TUMOR (TURBT);  Surgeon: Dutch Gray, MD;  [facility];  Service: Urology;  Laterality: N/A;.  PATH: squamous metaplasia, fibrosis with chronic inflammation, no malignancy.  . Cystoscopy with retrograde pyelogram, ureteroscopy and stent placement N/A 06/06/2013    Procedure: CYSTOSCOPY WITH RETROGRADE PYELOGRAM;  Surgeon: Dutch Gray, MD;  [facility];  Service: Urology;  Laterality: N/A;  . Cystoscopy w/ retrogrades Bilateral 06/22/2015    Procedure: CYSTOSCOPY WITH RETROGRADE PYELOGRAM;  Surgeon: Raynelle Bring, MD;  [facility];  Service: Urology;  Laterality: Bilateral;  . Transurethral resection of bladder tumor N/A 06/22/2015    Carcinoma in situ: no invasive malignancy.  Procedure: TRANSURETHRAL RESECTION OF BLADDER TUMOR (TURBT);  Surgeon: Raynelle Bring, MD;  [facility];  Service: Urology;  Laterality: N/A;  **BLADDER BIOPSY*RESECTION**  . Cardiac catheterization N/A 09/01/2015    Procedure: Left Heart Cath and Coronary Angiography;  Surgeon: Belva Crome, MD;  Location: [place] [facility];  Service: Cardiovascular;  Laterality: N/A;    Social History   Social History  . Marital Status: Married    Spouse Name: N/A  . Number of Children: N/A  . Years of Education: N/A   Occupational History  . Not on file.   Social History Main Topics  . Smoking status: Current Some Day Smoker -- 0.25 packs/day for 40 years    Types: Cigarettes    Last Attempt  to Quit: 09/08/2013  . Smokeless tobacco: Never Used  . Alcohol Use: No  . Drug Use: No  . Sexual Activity: Not on file   Other Topics Concern  . Not on file   Social History Narrative   Married, 2 children, 4 grandchildren--[place].   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   Tob: 30 pack-yr hx, quit 08/2011.   No alcohol or drug use.   Active on farm but no formal exercise regimen.    Family History  Problem Relation Age of Onset  . Hypertension Mother   . Cancer Father     colon and prostate, mets to lungs.  . Hypertension Father   .  Heart disease Brother     d. 43 MI  . Multiple sclerosis Brother   . Diabetes Brother   . Heart disease Paternal Uncle     ROS: no fevers or chills, productive cough, hemoptysis, dysphasia, odynophagia, melena, hematochezia, dysuria, hematuria, rash, seizure activity, orthopnea, PND, pedal edema, claudication. Remaining systems are negative.  Physical Exam: Well-developed well-nourished in no acute distress.  Skin is warm and dry.  HEENT is normal.  Neck is supple.  Chest is clear to auscultation with normal expansion.  Cardiovascular exam is regular rate and rhythm.  Abdominal exam nontender or distended. No masses palpated. Extremities show no edema. neuro grossly intact

## 2015-12-03 ENCOUNTER — Ambulatory Visit: Payer: BLUE CROSS/BLUE SHIELD | Admitting: Cardiology

## 2015-12-03 ENCOUNTER — Encounter: Payer: Self-pay | Admitting: Cardiology

## 2015-12-03 ENCOUNTER — Ambulatory Visit (INDEPENDENT_AMBULATORY_CARE_PROVIDER_SITE_OTHER): Payer: BLUE CROSS/BLUE SHIELD | Admitting: Cardiology

## 2015-12-03 VITALS — BP 112/88 | HR 66 | Ht 67.0 in | Wt 177.4 lb

## 2015-12-03 DIAGNOSIS — I2510 Atherosclerotic heart disease of native coronary artery without angina pectoris: Secondary | ICD-10-CM | POA: Diagnosis not present

## 2015-12-03 DIAGNOSIS — E785 Hyperlipidemia, unspecified: Secondary | ICD-10-CM | POA: Diagnosis not present

## 2015-12-03 DIAGNOSIS — I255 Ischemic cardiomyopathy: Secondary | ICD-10-CM

## 2015-12-03 DIAGNOSIS — I10 Essential (primary) hypertension: Secondary | ICD-10-CM

## 2015-12-03 NOTE — Assessment & Plan Note (Signed)
Blood pressure controlled. Continue present medications. 

## 2015-12-03 NOTE — Patient Instructions (Signed)
Your physician wants you to follow-up in: 6 MONTHS WITH DR CRENSHAW You will receive a reminder letter in the mail two months in advance. If you don't receive a letter, please call our office to schedule the follow-up appointment.   If you need a refill on your cardiac medications before your next appointment, please call your pharmacy.  

## 2015-12-03 NOTE — Assessment & Plan Note (Signed)
Continue ACE inhibitor and beta blocker. 

## 2015-12-03 NOTE — Assessment & Plan Note (Signed)
Continue aspirin and statin. 

## 2015-12-03 NOTE — Assessment & Plan Note (Signed)
Continue statin. 

## 2015-12-28 ENCOUNTER — Ambulatory Visit: Payer: BLUE CROSS/BLUE SHIELD | Admitting: Family Medicine

## 2016-01-06 ENCOUNTER — Encounter: Payer: Self-pay | Admitting: Family Medicine

## 2016-01-06 ENCOUNTER — Ambulatory Visit (INDEPENDENT_AMBULATORY_CARE_PROVIDER_SITE_OTHER): Payer: BLUE CROSS/BLUE SHIELD | Admitting: Family Medicine

## 2016-01-06 VITALS — BP 105/57 | HR 55 | Temp 97.4°F | Resp 16 | Ht 67.0 in | Wt 172.5 lb

## 2016-01-06 DIAGNOSIS — C679 Malignant neoplasm of bladder, unspecified: Secondary | ICD-10-CM

## 2016-01-06 DIAGNOSIS — I10 Essential (primary) hypertension: Secondary | ICD-10-CM | POA: Diagnosis not present

## 2016-01-06 DIAGNOSIS — E118 Type 2 diabetes mellitus with unspecified complications: Secondary | ICD-10-CM | POA: Diagnosis not present

## 2016-01-06 DIAGNOSIS — E785 Hyperlipidemia, unspecified: Secondary | ICD-10-CM | POA: Diagnosis not present

## 2016-01-06 DIAGNOSIS — I2510 Atherosclerotic heart disease of native coronary artery without angina pectoris: Secondary | ICD-10-CM

## 2016-01-06 LAB — BASIC METABOLIC PANEL
BUN: 22 mg/dL (ref 6–23)
CALCIUM: 9.3 mg/dL (ref 8.4–10.5)
CO2: 25 meq/L (ref 19–32)
CREATININE: 1.1 mg/dL (ref 0.40–1.50)
Chloride: 105 mEq/L (ref 96–112)
GFR: 72.43 mL/min (ref >60.00)
Glucose, Bld: 83 mg/dL (ref 70–99)
Potassium: 4.1 mEq/L (ref 3.5–5.1)
SODIUM: 138 meq/L (ref 135–145)

## 2016-01-06 LAB — HEMOGLOBIN A1C: HEMOGLOBIN A1C: 6.1 % (ref 4.6–6.5)

## 2016-01-06 NOTE — Progress Notes (Signed)
OFFICE VISIT  01/06/2016   CC:  Chief Complaint  Patient presents with  . Follow-up    Pt is fasting.      HPI:    Patient is a 60 y.o. Caucasian male who presents for 4 mo f/u DM 2, HTN, HLD, CAD. He had NSTEMI 08/2015--treated medically (See details in [place] section below).  No new/acute complaints.  Bladder ca: getting BCG treatments, says he has a moderate amount of residual pain from these.  DM 2: avg fasting around 90-100, then later in the day it is normal and has an occasional low gluc (he does have hypoglycemic awareness). He is very active lately.  Home bp monitoring : all <130/80.  Tolerating atorvastatin 80 mg qd, compliant with this med.  Eating heart healthy, low sodium diet.    Past Medical History  Diagnosis Date  . History of bladder cancer 2012    Mass removed, plus gets ongoing BCG bladder infusions by urologist (Dr. Alinda Money).  Recurrence documented on cysto + bladder washings 10/14/15--needs another surgery.  Marland Kitchen CAD (coronary artery disease)     a. '05 MI > DES to circumflex. b. NSTEMI 08/2015 with total occlusion of the mid LAD along with 20% mid-distal Cx, 100% dCX, 40% D1, 50% pCx, 50% m-dRCA, 80% dRCA.Given >12 hours out from symptom onset, medical therapy recommended.  Marland Kitchen HTN (hypertension)   . Hyperlipidemia   . Diabetes mellitus type 2, controlled ([place])     HbA1c 02/2011 was 6.6%  . Wolf-Parkinson-White syndrome   . Tobacco abuse   . BPH (benign prostatic hyperplasia)     with hx of acute prostatitis  . Elevated PSA 09/2011    with prostate nodule.  Prostate bx ALL BENIGN 10/10/11 (Dr. Alinda Money)  . Myocardial infarction [facility]) 2005; 08/2015    followed by Dr. Stanford Breed  . GERD (gastroesophageal reflux disease)     occasional  . Headache(784.0)     cluster headaches a long time ago  . Arthritis   . Thrombocytopenia ([place])     Stable 2012-2016  . Complication of anesthesia     agitation   . History of bronchitis   . History of vertigo   . Urinary  tract infection   . Ear infection 06/17/2015  . Ascending aorta dilatation (HCC)     a. Ascending aorta mildly dilated by echo 09/03/15.  . Ischemic cardiomyopathy     a. 2D Echo 09/03/15: mild LVH, EF 40-45%, diffuse HK, akinesis of apical myocardium, grade 1 DD, aortic root dimention 53mm, ascending aorta mildly dilated, mildly dilated RV/RA.     Past Surgical History  Procedure Laterality Date  . Bladder tumor excision  11/08/10; 05/2015    urothelial carcinoma (Dr. Alinda Money).  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  . Myocardial perfusion study  2009    Neg ischemia, EF 52%  . Lexiscan  2012    Normal/negative  . Lumbar spine surgery  [place] in [place] ([place])-no trouble since last surgery  . Colonoscopy  2010    Dr. Isa Rankin, repeat 2020.  Patient wants to go to different GI when time for repeat.  . Vasectomy    . Cardiac catheterization  2005    with stent placement  . Transurethral resection of bladder tumor N/A 06/06/2013    Procedure: TRANSURETHRAL RESECTION OF BLADDER TUMOR (TURBT);  Surgeon: Dutch Gray, MD;  [facility];  Service: Urology;  Laterality: N/A;.  PATH: squamous metaplasia,  fibrosis with chronic inflammation, no malignancy.  . Cystoscopy with retrograde pyelogram, ureteroscopy and stent placement N/A 06/06/2013    Procedure: CYSTOSCOPY WITH RETROGRADE PYELOGRAM;  Surgeon: Dutch Gray, MD;  [facility];  Service: Urology;  Laterality: N/A;  . Cystoscopy w/ retrogrades Bilateral 06/22/2015    Procedure: CYSTOSCOPY WITH RETROGRADE PYELOGRAM;  Surgeon: Raynelle Bring, MD;  [facility];  Service: Urology;  Laterality: Bilateral;  . Transurethral resection of bladder tumor N/A 06/22/2015    Carcinoma in situ: no invasive malignancy.  Procedure: TRANSURETHRAL RESECTION OF BLADDER TUMOR (TURBT);  Surgeon: Raynelle Bring, MD;  [facility];  Service: Urology;  Laterality: N/A;  **BLADDER  BIOPSY*RESECTION**  . Cardiac catheterization N/A 09/01/2015    Procedure: Left Heart Cath and Coronary Angiography;  Surgeon: Belva Crome, MD;  Location: [place] [facility];  Service: Cardiovascular;  Laterality: N/A;    Outpatient Prescriptions Prior to Visit  Medication Sig Dispense Refill  . acetaminophen (TYLENOL) 500 MG tablet Take 1,000 mg by mouth every 6 (six) hours as needed for mild pain or headache.     Marland Kitchen aspirin EC 81 MG tablet Take 81 mg by mouth daily.    Marland Kitchen atenolol (TENORMIN) 50 MG tablet Take 1 tablet (50 mg total) by mouth daily with lunch. 90 tablet 3  . atorvastatin (LIPITOR) 80 MG tablet Take 1 tablet (80 mg total) by mouth every evening. 30 tablet 6  . clopidogrel (PLAVIX) 75 MG tablet Take 1 tablet (75 mg total) by mouth daily. 30 tablet 6  . fluticasone (FLONASE) 50 MCG/ACT nasal spray Place 1 spray into both nostrils daily as needed for allergies. 16 g 3  . glipiZIDE (GLUCOTROL XL) 10 MG 24 hr tablet TAKE ONE TABLET BY MOUTH EVERY DAY WITH LUNCH 90 tablet 1  . lisinopril (PRINIVIL,ZESTRIL) 20 MG tablet TAKE ONE TABLET BY MOUTH EVERY DAY WITH LUNCH 90 tablet 3  . metFORMIN (GLUCOPHAGE) 1000 MG tablet TAKE ONE TABLET BY MOUTH TWICE DAILY WITH A MEAL 180 tablet 1  . nitroGLYCERIN (NITROSTAT) 0.4 MG SL tablet Place 1 tablet (0.4 mg total) under the tongue every 5 (five) minutes as needed for chest pain (up to 3 doses). 25 tablet 3  . phenazopyridine (PYRIDIUM) 200 MG tablet Take 1 tablet (200 mg total) by mouth 3 (three) times daily as needed (burning with urination). 21 tablet 0  . tamsulosin (FLOMAX) 0.4 MG CAPS capsule TAKE 1 CAPSULE BY MOUTH EVERY DAY AFTER SUPPER 90 capsule 1   No facility-administered medications prior to visit.    No Known Allergies  ROS As per HPI  PE: Blood pressure 105/57, pulse 55, temperature 97.4 F (36.3 C), temperature source Oral, resp. rate 16, height 5\' 7"  (1.702 m), weight 172 lb 8 oz (78.245 kg), SpO2 95 %. Gen: Alert, well  appearing.  Patient is oriented to person, place, time, and situation. CV: RRR, no m/r/g.   LUNGS: CTA bilat, nonlabored resps, good aeration in all lung fields. EXT: no clubbing, cyanosis, or edema.    LABS:  Lab Results  Component Value Date   TSH 0.96 08/26/2015   Lab Results  Component Value Date   WBC 7.0 09/03/2015   HGB 15.5 09/03/2015   HCT 43.8 09/03/2015   MCV 88.0 09/03/2015   PLT 112* 09/03/2015   Lab Results  Component Value Date   CREATININE 1.08 09/03/2015   BUN 13 09/03/2015   NA 136 09/03/2015   K 4.1 09/03/2015   CL 102 09/03/2015  CO2 24 09/03/2015   Lab Results  Component Value Date   ALT 20 10/19/2015   AST 20 10/19/2015   ALKPHOS 59 10/19/2015   BILITOT 0.9 10/19/2015   Lab Results  Component Value Date   CHOL 80* 10/19/2015   Lab Results  Component Value Date   HDL 24* 10/19/2015   Lab Results  Component Value Date   LDLCALC 41 10/19/2015   Lab Results  Component Value Date   TRIG 75 10/19/2015   Lab Results  Component Value Date   CHOLHDL 3.3 10/19/2015   Lab Results  Component Value Date   HGBA1C 7.9* 08/26/2015    IMPRESSION AND PLAN:  1) DM 2; great control. Hba1c today. Needs eye exam--reminded again today.  2) HTN; The current medical regimen is effective;  continue present plan and medications. Lytes/cr today.  3) HLD: tolerating statin, last lipids 3 mo ago normal.  4) CAD: med mgmt per cards: statin, asa, plavix.  5) Bladder ca: getting BCG treatments still.  F/u as per urology.  An After Visit Summary was printed and given to the patient.  FOLLOW UP: Return in about 4 months (around 05/07/2016) for routine chronic illness f/u.  Signed:  Crissie Sickles, MD           01/06/2016

## 2016-01-06 NOTE — Progress Notes (Signed)
Pre visit review using our clinic review tool, if applicable. No additional management support is needed unless otherwise documented below in the visit note. 

## 2016-01-13 DIAGNOSIS — R972 Elevated prostate specific antigen [PSA]: Secondary | ICD-10-CM | POA: Diagnosis not present

## 2016-01-13 DIAGNOSIS — R350 Frequency of micturition: Secondary | ICD-10-CM | POA: Diagnosis not present

## 2016-01-13 LAB — PSA: PSA: 2.25

## 2016-01-20 DIAGNOSIS — R350 Frequency of micturition: Secondary | ICD-10-CM | POA: Diagnosis not present

## 2016-01-20 DIAGNOSIS — C678 Malignant neoplasm of overlapping sites of bladder: Secondary | ICD-10-CM | POA: Diagnosis not present

## 2016-01-20 DIAGNOSIS — R972 Elevated prostate specific antigen [PSA]: Secondary | ICD-10-CM | POA: Diagnosis not present

## 2016-01-24 ENCOUNTER — Encounter: Payer: Self-pay | Admitting: Family Medicine

## 2016-02-11 LAB — HM DIABETES EYE EXAM

## 2016-02-12 ENCOUNTER — Encounter: Payer: Self-pay | Admitting: Family Medicine

## 2016-03-07 DIAGNOSIS — C678 Malignant neoplasm of overlapping sites of bladder: Secondary | ICD-10-CM | POA: Diagnosis not present

## 2016-03-07 DIAGNOSIS — Z5111 Encounter for antineoplastic chemotherapy: Secondary | ICD-10-CM | POA: Diagnosis not present

## 2016-03-14 DIAGNOSIS — C678 Malignant neoplasm of overlapping sites of bladder: Secondary | ICD-10-CM | POA: Diagnosis not present

## 2016-03-14 DIAGNOSIS — R8271 Bacteriuria: Secondary | ICD-10-CM | POA: Diagnosis not present

## 2016-03-17 ENCOUNTER — Other Ambulatory Visit: Payer: Self-pay | Admitting: Family Medicine

## 2016-03-18 ENCOUNTER — Other Ambulatory Visit: Payer: Self-pay | Admitting: Family Medicine

## 2016-03-18 MED ORDER — GLIPIZIDE ER 10 MG PO TB24: ORAL_TABLET | ORAL | 1 refills | Status: DC

## 2016-03-18 NOTE — Telephone Encounter (Signed)
[place]  RF request for LOV:  01/06/16 NOV:05/06/16 Last Written:  03/17/16 Glipizide 10 mg #90with 1 RF

## 2016-03-21 DIAGNOSIS — Z5111 Encounter for antineoplastic chemotherapy: Secondary | ICD-10-CM | POA: Diagnosis not present

## 2016-03-21 DIAGNOSIS — C678 Malignant neoplasm of overlapping sites of bladder: Secondary | ICD-10-CM | POA: Diagnosis not present

## 2016-03-29 ENCOUNTER — Other Ambulatory Visit: Payer: Self-pay | Admitting: *Deleted

## 2016-03-29 DIAGNOSIS — R300 Dysuria: Secondary | ICD-10-CM | POA: Diagnosis not present

## 2016-03-29 DIAGNOSIS — C678 Malignant neoplasm of overlapping sites of bladder: Secondary | ICD-10-CM | POA: Diagnosis not present

## 2016-03-29 DIAGNOSIS — Z5111 Encounter for antineoplastic chemotherapy: Secondary | ICD-10-CM | POA: Diagnosis not present

## 2016-03-29 MED ORDER — ATORVASTATIN CALCIUM 80 MG PO TABS: 80.0000 mg | ORAL_TABLET | Freq: Every evening | ORAL | 2 refills | Status: DC

## 2016-03-29 MED ORDER — CLOPIDOGREL BISULFATE 75 MG PO TABS: 75.0000 mg | ORAL_TABLET | Freq: Every day | ORAL | 2 refills | Status: DC

## 2016-03-29 NOTE — Telephone Encounter (Signed)
called and informed pt that we sent refills, pt aware

## 2016-03-31 ENCOUNTER — Other Ambulatory Visit: Payer: Self-pay | Admitting: Family Medicine

## 2016-04-06 DIAGNOSIS — R3914 Feeling of incomplete bladder emptying: Secondary | ICD-10-CM | POA: Diagnosis not present

## 2016-04-06 DIAGNOSIS — R8271 Bacteriuria: Secondary | ICD-10-CM | POA: Diagnosis not present

## 2016-04-06 DIAGNOSIS — R300 Dysuria: Secondary | ICD-10-CM | POA: Diagnosis not present

## 2016-04-13 DIAGNOSIS — C678 Malignant neoplasm of overlapping sites of bladder: Secondary | ICD-10-CM | POA: Diagnosis not present

## 2016-04-13 DIAGNOSIS — R3914 Feeling of incomplete bladder emptying: Secondary | ICD-10-CM | POA: Diagnosis not present

## 2016-04-16 LAB — BASIC METABOLIC PANEL: Glucose: 133 mg/dL

## 2016-04-17 ENCOUNTER — Encounter: Payer: Self-pay | Admitting: Family Medicine

## 2016-04-25 DIAGNOSIS — R300 Dysuria: Secondary | ICD-10-CM | POA: Diagnosis not present

## 2016-04-25 DIAGNOSIS — R350 Frequency of micturition: Secondary | ICD-10-CM | POA: Diagnosis not present

## 2016-04-25 DIAGNOSIS — R8271 Bacteriuria: Secondary | ICD-10-CM | POA: Diagnosis not present

## 2016-05-06 ENCOUNTER — Ambulatory Visit (INDEPENDENT_AMBULATORY_CARE_PROVIDER_SITE_OTHER): Payer: BLUE CROSS/BLUE SHIELD | Admitting: Family Medicine

## 2016-05-06 ENCOUNTER — Encounter: Payer: Self-pay | Admitting: Family Medicine

## 2016-05-06 VITALS — BP 111/76 | HR 56 | Temp 97.4°F | Resp 16 | Wt 175.8 lb

## 2016-05-06 DIAGNOSIS — I10 Essential (primary) hypertension: Secondary | ICD-10-CM

## 2016-05-06 DIAGNOSIS — Z23 Encounter for immunization: Secondary | ICD-10-CM

## 2016-05-06 DIAGNOSIS — E7800 Pure hypercholesterolemia, unspecified: Secondary | ICD-10-CM

## 2016-05-06 DIAGNOSIS — E118 Type 2 diabetes mellitus with unspecified complications: Secondary | ICD-10-CM | POA: Diagnosis not present

## 2016-05-06 LAB — POCT GLYCOSYLATED HEMOGLOBIN (HGB A1C): Hemoglobin A1C: 6.2

## 2016-05-06 NOTE — Progress Notes (Signed)
OFFICE VISIT  05/06/2016   CC:  Chief Complaint  Patient presents with  . Follow-up    Diabetes    HPI:    Patient is a 60 y.o. Caucasian male who presents for 4 mo f/u DM 2, HTN, HLD.  Feet: no burning, tingling, or numbness except intermittent tingling on L foot 2nd and 3rd toes mainly at night.  No hx of foot ulcer. Hx of L great toe decreased sensation since approx 1990 (his last back surgery)  DM: avg 95.  Has had only one episode of hypoglyc (20s) since last visit.  BP: home monitoring 115/70s consistently. HLD: tolerating atorvastatin 80 mg qd, compliant with this med daily.  Past Medical History:  Diagnosis Date  . Arthritis   . Ascending aorta dilatation (HCC)    a. Ascending aorta mildly dilated by echo 09/03/15.  Marland Kitchen BPH (benign prostatic hyperplasia)    with hx of acute prostatitis  . CAD (coronary artery disease)    a. '05 MI > DES to circumflex. b. NSTEMI 08/2015 with total occlusion of the mid LAD along with 20% mid-distal Cx, 100% dCX, 40% D1, 50% pCx, 50% m-dRCA, 80% dRCA.Given >12 hours out from symptom onset, medical therapy recommended.  . Diabetes mellitus type 2, controlled ([place])    HbA1c 02/2011 was 6.6%  . Elevated PSA 09/2011   with prostate nodule.  Prostate bx ALL BENIGN 10/10/11 (Dr. Christella Hartigan followed by Urol.  Marland Kitchen GERD (gastroesophageal reflux disease)    occasional  . Headache(784.0)    cluster headaches a long time ago  . History of bladder cancer 2012   Mass removed, plus gets ongoing BCG bladder infusions by urologist (Dr. Alinda Money).  Recurrence documented on cysto + bladder washings 10/14/15--getting more maintenance BCG.  Cysto 01/20/16 OK, then more BCG given.  Cysto 04/13/16 c/w just having finished BCG but cytology/FISH being checked by urol to make sure no sign of in-situ cancer.  Marland Kitchen History of bronchitis   . History of vertigo   . HTN (hypertension)   . Hyperlipidemia   . Ischemic cardiomyopathy    a. 2D Echo 09/03/15: mild LVH, EF 40-45%,  diffuse HK, akinesis of apical myocardium, grade 1 DD, aortic root dimention 46mm, ascending aorta mildly dilated, mildly dilated RV/RA.   Marland Kitchen Myocardial infarction 2005; 08/2015   followed by Dr. Stanford Breed  . Thrombocytopenia ([place])    Stable 2012-2017  . Tobacco abuse   . Urinary tract infection   . Wolf-Parkinson-White syndrome     Past Surgical History:  Procedure Laterality Date  . BLADDER TUMOR EXCISION  11/08/10; 05/2015   urothelial carcinoma (Dr. Alinda Money).  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  Marland Kitchen CARDIAC CATHETERIZATION  2005   with stent placement  . CARDIAC CATHETERIZATION N/A 09/01/2015   Procedure: Left Heart Cath and Coronary Angiography;  Surgeon: Belva Crome, MD;  Location: [place] [facility];  Service: Cardiovascular;  Laterality: N/A;  . COLONOSCOPY  2010   Dr. Isa Rankin, repeat 2020.  Patient wants to go to different GI when time for repeat.  . CYSTOSCOPY W/ RETROGRADES Bilateral 06/22/2015   Procedure: CYSTOSCOPY WITH RETROGRADE PYELOGRAM;  Surgeon: Raynelle Bring, MD;  [facility];  Service: Urology;  Laterality: Bilateral;  . CYSTOSCOPY WITH RETROGRADE PYELOGRAM, URETEROSCOPY AND STENT PLACEMENT N/A 06/06/2013   Procedure: CYSTOSCOPY WITH RETROGRADE PYELOGRAM;  Surgeon: Dutch Gray, MD;  [facility];  Service: Urology;  Laterality: N/A;  . lexiscan  2012   Normal/negative  .  [place] in [place] ([place])-no trouble since last surgery  . myocardial perfusion study  2009   Neg ischemia, EF 52%  . TRANSURETHRAL RESECTION OF BLADDER TUMOR N/A 06/06/2013   Procedure: TRANSURETHRAL RESECTION OF BLADDER TUMOR (TURBT);  Surgeon: Dutch Gray, MD;  [facility];  Service: Urology;  Laterality: N/A;.  PATH: squamous metaplasia, fibrosis with chronic inflammation, no malignancy.  . TRANSURETHRAL RESECTION OF BLADDER TUMOR N/A 06/22/2015   Carcinoma in situ: no invasive malignancy.   Procedure: TRANSURETHRAL RESECTION OF BLADDER TUMOR (TURBT);  Surgeon: Raynelle Bring, MD;  [facility];  Service: Urology;  Laterality: N/A;  **BLADDER BIOPSY*RESECTION**  . VASECTOMY      Outpatient Medications Prior to Visit  Medication Sig Dispense Refill  . acetaminophen (TYLENOL) 500 MG tablet Take 1,000 mg by mouth every 6 (six) hours as needed for mild pain or headache.     Marland Kitchen aspirin EC 81 MG tablet Take 81 mg by mouth daily.    Marland Kitchen atenolol (TENORMIN) 50 MG tablet Take 1 tablet (50 mg total) by mouth daily with lunch. 90 tablet 3  . atorvastatin (LIPITOR) 80 MG tablet Take 1 tablet (80 mg total) by mouth every evening. 90 tablet 2  . clopidogrel (PLAVIX) 75 MG tablet Take 1 tablet (75 mg total) by mouth daily. 90 tablet 2  . fluticasone (FLONASE) 50 MCG/ACT nasal spray Place 1 spray into both nostrils daily as needed for allergies. 16 g 3  . glipiZIDE (GLIPIZIDE XL) 10 MG 24 hr tablet TAKE ONE TABLET BY MOUTH EVERY DAY WITH LUNCH 90 tablet 1  . lisinopril (PRINIVIL,ZESTRIL) 20 MG tablet TAKE ONE TABLET BY MOUTH EVERY DAY WITH LUNCH 90 tablet 3  . metFORMIN (GLUCOPHAGE) 1000 MG tablet TAKE ONE TABLET BY MOUTH TWICE DAILY WITH A MEAL 180 tablet 1  . nitroGLYCERIN (NITROSTAT) 0.4 MG SL tablet Place 1 tablet (0.4 mg total) under the tongue every 5 (five) minutes as needed for chest pain (up to 3 doses). 25 tablet 3  . phenazopyridine (PYRIDIUM) 200 MG tablet Take 1 tablet (200 mg total) by mouth 3 (three) times daily as needed (burning with urination). 21 tablet 0  . tamsulosin (FLOMAX) 0.4 MG CAPS capsule TAKE 1 CAPSULE BY MOUTH EVERY DAY AFTER SUPPER 90 capsule 1   No facility-administered medications prior to visit.     No Known Allergies  ROS As per HPI  PE: Blood pressure 111/76, pulse (!) 56, temperature 97.4 F (36.3 C), temperature source Oral, resp. rate 16, weight 175 lb 12.8 oz (79.7 kg), SpO2 97 %. Gen: Alert, well appearing.  Patient is oriented to person, place,  time, and situation. AFFECT: pleasant, lucid thought and speech. Foot exam - ; no swelling, tenderness or skin or vascular lesions. Color and temperature is normal. Sensation is intact except for decreased sensation with monofilament testing on L great toe. Peripheral pulses are palpable. Toenails are normal.   LABS:  Lab Results  Component Value Date   TSH 0.96 08/26/2015   Lab Results  Component Value Date   WBC 7.0 09/03/2015   HGB 15.5 09/03/2015   HCT 43.8 09/03/2015   MCV 88.0 09/03/2015   PLT 112 (L) 09/03/2015   Lab Results  Component Value Date   CREATININE 1.10 01/06/2016   BUN 22 01/06/2016   NA 138 01/06/2016   K 4.1 01/06/2016   CL 105 01/06/2016   CO2 25 01/06/2016   Lab Results  Component  Value Date   ALT 20 10/19/2015   AST 20 10/19/2015   ALKPHOS 59 10/19/2015   BILITOT 0.9 10/19/2015   Lab Results  Component Value Date   CHOL 80 (L) 10/19/2015   Lab Results  Component Value Date   HDL 24 (L) 10/19/2015   Lab Results  Component Value Date   LDLCALC 41 10/19/2015   Lab Results  Component Value Date   TRIG 75 10/19/2015   Lab Results  Component Value Date   CHOLHDL 3.3 10/19/2015   Lab Results  Component Value Date   HGBA1C 6.2 05/06/2016   POC HbA1c today: 6.2%  IMPRESSION AND PLAN:  1) DM 2: The current medical regimen is effective;  continue present plan and medications. A1c 6.2% today. Feet exam normal except decreased sensation with monofilament on L great toe. Flu vaccine today.  2) HTN; The current medical regimen is effective;  continue present plan and medications. Lytes/cr stable 4 mo ago.  3) Hyperlipidemia: tolerating statin.  Lipid panel excellent 10/19/15.  Will repeat at f/u in 4 mo.  An After Visit Summary was printed and given to the patient.  FOLLOW UP: Return in about 4 months (around 09/06/2016) for annual CPE (fasting).  Signed:  Crissie Sickles, MD           05/06/2016

## 2016-05-09 ENCOUNTER — Other Ambulatory Visit: Payer: Self-pay | Admitting: Family Medicine

## 2016-05-17 ENCOUNTER — Encounter: Payer: Self-pay | Admitting: Cardiology

## 2016-05-18 ENCOUNTER — Other Ambulatory Visit: Payer: Self-pay | Admitting: Family Medicine

## 2016-05-24 NOTE — Progress Notes (Signed)
HPI: FU coronary disease status post drug-eluting stent to the circumflex in August 2005. Patient admitted 2/17 with myocardial infarction. Cardiac catheterization February 2017 showed an occluded distal circumflex. The mid to distal LAD was occluded as well. Ejection fraction 40-50%. Patient was treated medically because of late presentation. Echocardiogram February 2017 showed ejection fraction 40-45% with akinesis of the apical myocardium. Mild right atrial and right ventricular enlargement. Since I last saw him, patient had 2 brief episodes of chest pain that resolved with rest. However he does not typically have dyspnea on exertion, exertional chest pain or syncope.   Current Outpatient Prescriptions  Medication Sig Dispense Refill  . acetaminophen (TYLENOL) 500 MG tablet Take 1,000 mg by mouth every 6 (six) hours as needed for mild pain or headache.     Marland Kitchen aspirin EC 81 MG tablet Take 81 mg by mouth daily.    Marland Kitchen atenolol (TENORMIN) 50 MG tablet TAKE ONE TABLET BY MOUTH EVERY DAY WITH LUNCH 90 tablet 3  . atorvastatin (LIPITOR) 80 MG tablet Take 1 tablet (80 mg total) by mouth every evening. 90 tablet 2  . clopidogrel (PLAVIX) 75 MG tablet Take 1 tablet (75 mg total) by mouth daily. 90 tablet 2  . fluticasone (FLONASE) 50 MCG/ACT nasal spray Place 1 spray into both nostrils daily as needed for allergies. 16 g 3  . glipiZIDE (GLIPIZIDE XL) 10 MG 24 hr tablet TAKE ONE TABLET BY MOUTH EVERY DAY WITH LUNCH 90 tablet 1  . lisinopril (PRINIVIL,ZESTRIL) 20 MG tablet TAKE ONE TABLET BY MOUTH EVERY DAY WITH LUNCH 90 tablet 3  . metFORMIN (GLUCOPHAGE) 1000 MG tablet TAKE ONE TABLET BY MOUTH TWICE DAILY WITH A MEAL 180 tablet 1  . nitroGLYCERIN (NITROSTAT) 0.4 MG SL tablet Place 1 tablet (0.4 mg total) under the tongue every 5 (five) minutes as needed for chest pain (up to 3 doses). 25 tablet 3  . oxybutynin (DITROPAN) 5 MG tablet Take 5 mg by mouth 3 (three) times daily.    . phenazopyridine  (PYRIDIUM) 200 MG tablet Take 1 tablet (200 mg total) by mouth 3 (three) times daily as needed (burning with urination). 21 tablet 0  . tamsulosin (FLOMAX) 0.4 MG CAPS capsule TAKE 1 CAPSULE BY MOUTH EVERY DAY AFTER SUPPER 90 capsule 3   No current facility-administered medications for this visit.      Past Medical History:  Diagnosis Date  . Arthritis   . Ascending aorta dilatation (HCC)    a. Ascending aorta mildly dilated by echo 09/03/15.  Marland Kitchen BPH (benign prostatic hyperplasia)    with hx of acute prostatitis  . CAD (coronary artery disease)    a. '05 MI > DES to circumflex. b. NSTEMI 08/2015 with total occlusion of the mid LAD along with 20% mid-distal Cx, 100% dCX, 40% D1, 50% pCx, 50% m-dRCA, 80% dRCA.Given >12 hours out from symptom onset, medical therapy recommended.  . Diabetes mellitus type 2, controlled ([place])    HbA1c 02/2011 was 6.6%  . Elevated PSA 09/2011   with prostate nodule.  Prostate bx ALL BENIGN 10/10/11 (Dr. Christella Hartigan followed by Urol.  Marland Kitchen GERD (gastroesophageal reflux disease)    occasional  . Headache(784.0)    cluster headaches a long time ago  . History of bladder cancer 2012   Mass removed, plus gets ongoing BCG bladder infusions by urologist (Dr. Alinda Money).  Recurrence documented on cysto + bladder washings 10/14/15--getting more maintenance BCG.  Cysto 01/20/16 OK, then more BCG given.  Cysto 04/13/16 c/w just having finished BCG but cytology/FISH being checked by urol to make sure no sign of in-situ cancer.  Marland Kitchen History of bronchitis   . History of vertigo   . HTN (hypertension)   . Hyperlipidemia   . Ischemic cardiomyopathy    a. 2D Echo 09/03/15: mild LVH, EF 40-45%, diffuse HK, akinesis of apical myocardium, grade 1 DD, aortic root dimention 50mm, ascending aorta mildly dilated, mildly dilated RV/RA.   Marland Kitchen Myocardial infarction 2005; 08/2015   followed by Dr. Stanford Breed  . Thrombocytopenia ([place])    Stable 2012-2017  . Tobacco abuse   . Urinary tract infection   .  Wolf-Parkinson-White syndrome     Past Surgical History:  Procedure Laterality Date  . BLADDER TUMOR EXCISION  11/08/10; 05/2015   urothelial carcinoma (Dr. Alinda Money).  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  Marland Kitchen CARDIAC CATHETERIZATION  2005   with stent placement  . CARDIAC CATHETERIZATION N/A 09/01/2015   Procedure: Left Heart Cath and Coronary Angiography;  Surgeon: Belva Crome, MD;  Location: [facility];  Service: Cardiovascular;  Laterality: N/A;  . COLONOSCOPY  2010   Dr. Isa Rankin, repeat 2020.  Patient wants to go to different GI when time for repeat.  . CYSTOSCOPY W/ RETROGRADES Bilateral 06/22/2015   Procedure: CYSTOSCOPY WITH RETROGRADE PYELOGRAM;  Surgeon: Raynelle Bring, MD;  Location: [facility];  Service: Urology;  Laterality: Bilateral;  . CYSTOSCOPY WITH RETROGRADE PYELOGRAM, URETEROSCOPY AND STENT PLACEMENT N/A 06/06/2013   Procedure: CYSTOSCOPY WITH RETROGRADE PYELOGRAM;  Surgeon: Dutch Gray, MD;  Location: [facility];  Service: Urology;  Laterality: N/A;  . lexiscan  2012   Normal/negative  . [place] in [place] ([place])-no trouble since last surgery  . myocardial perfusion study  2009   Neg ischemia, EF 52%  . TRANSURETHRAL RESECTION OF BLADDER TUMOR N/A 06/06/2013   Procedure: TRANSURETHRAL RESECTION OF BLADDER TUMOR (TURBT);  Surgeon: Dutch Gray, MD;  Location: [facility];  Service: Urology;  Laterality: N/A;.  PATH: squamous metaplasia, fibrosis with chronic inflammation, no malignancy.  . TRANSURETHRAL RESECTION OF BLADDER TUMOR N/A 06/22/2015   Carcinoma in situ: no invasive malignancy.  Procedure: TRANSURETHRAL RESECTION OF BLADDER TUMOR (TURBT);  Surgeon: Raynelle Bring, MD;  Location: [facility];  Service: Urology;  Laterality: N/A;  **BLADDER BIOPSY*RESECTION**  . VASECTOMY      Social History   Social History  . Marital status: Married    Spouse name: N/A  . Number of children: N/A    . Years of education: N/A   Occupational History  . Not on file.   Social History Main Topics  . Smoking status: Former Smoker    Packs/day: 0.25    Years: 40.00    Types: Cigarettes    Quit date: 09/05/2015  . Smokeless tobacco: Never Used  . Alcohol use No  . Drug use: No  . Sexual activity: Not on file   Other Topics Concern  . Not on file   Social History Narrative   Married, 2 children, 4 grandchildren--[place] now.   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   Tob: 30 pack-yr hx, quit 08/2011.   No alcohol or drug use.   Active on farm but no formal exercise regimen.    Family History  Problem Relation Age of Onset  .  Hypertension Mother   . Cancer Father     colon and prostate, mets to lungs.  . Hypertension Father   . Heart disease Brother     d. 20 MI  . Multiple sclerosis Brother   . Diabetes Brother   . Heart disease Paternal Uncle     ROS: no fevers or chills, productive cough, hemoptysis, dysphasia, odynophagia, melena, hematochezia, dysuria, hematuria, rash, seizure activity, orthopnea, PND, pedal edema, claudication. Remaining systems are negative.  Physical Exam: Well-developed well-nourished in no acute distress.  Skin is warm and dry.  HEENT is normal.  Neck is supple.  Chest is clear to auscultation with normal expansion.  Cardiovascular exam is regular rate and rhythm.  Abdominal exam nontender or distended. No masses palpated. Extremities show no edema. neuro grossly intact  ECG-Sinus bradycardia at a rate of 56. No ST changes.  A/P  1 Hyperlipidemia-continue statin.  2 hypertension-blood pressure controlled. Continue present medications.  3 ischemic cardiomyopathy-continue ACE inhibitor and beta blocker.  4 coronary artery disease-continue aspirin and statin. Patient had 2 brief episodes of chest pain but typically can perform  his daily activities of life with no chest pain. We will continue medical therapy. ECG is unchanged.   Kirk Ruths, MD

## 2016-05-31 ENCOUNTER — Encounter: Payer: Self-pay | Admitting: Cardiology

## 2016-05-31 ENCOUNTER — Ambulatory Visit (INDEPENDENT_AMBULATORY_CARE_PROVIDER_SITE_OTHER): Payer: BLUE CROSS/BLUE SHIELD | Admitting: Cardiology

## 2016-05-31 VITALS — BP 116/74 | HR 56 | Ht 67.0 in | Wt 176.0 lb

## 2016-05-31 DIAGNOSIS — I2510 Atherosclerotic heart disease of native coronary artery without angina pectoris: Secondary | ICD-10-CM | POA: Diagnosis not present

## 2016-05-31 DIAGNOSIS — E7800 Pure hypercholesterolemia, unspecified: Secondary | ICD-10-CM | POA: Diagnosis not present

## 2016-05-31 DIAGNOSIS — I10 Essential (primary) hypertension: Secondary | ICD-10-CM

## 2016-05-31 NOTE — Patient Instructions (Signed)
Your physician wants you to follow-up in: 6 MONTHS WITH DR CRENSHAW You will receive a reminder letter in the mail two months in advance. If you don't receive a letter, please call our office to schedule the follow-up appointment.   If you need a refill on your cardiac medications before your next appointment, please call your pharmacy.  

## 2016-07-27 DIAGNOSIS — Z8551 Personal history of malignant neoplasm of bladder: Secondary | ICD-10-CM | POA: Diagnosis not present

## 2016-08-03 ENCOUNTER — Encounter: Payer: Self-pay | Admitting: Family Medicine

## 2016-09-06 ENCOUNTER — Encounter: Payer: Self-pay | Admitting: Family Medicine

## 2016-09-06 ENCOUNTER — Ambulatory Visit (INDEPENDENT_AMBULATORY_CARE_PROVIDER_SITE_OTHER): Payer: BLUE CROSS/BLUE SHIELD | Admitting: Family Medicine

## 2016-09-06 VITALS — BP 114/76 | HR 56 | Temp 97.9°F | Resp 16 | Ht 66.5 in | Wt 185.5 lb

## 2016-09-06 DIAGNOSIS — Z1159 Encounter for screening for other viral diseases: Secondary | ICD-10-CM | POA: Diagnosis not present

## 2016-09-06 DIAGNOSIS — Z0000 Encounter for general adult medical examination without abnormal findings: Secondary | ICD-10-CM | POA: Diagnosis not present

## 2016-09-06 DIAGNOSIS — E118 Type 2 diabetes mellitus with unspecified complications: Secondary | ICD-10-CM

## 2016-09-06 DIAGNOSIS — E7800 Pure hypercholesterolemia, unspecified: Secondary | ICD-10-CM

## 2016-09-06 LAB — LIPID PANEL
CHOLESTEROL: 93 mg/dL (ref 0–200)
HDL: 30.5 mg/dL — ABNORMAL LOW (ref >39.00)
LDL Cholesterol: 44 mg/dL (ref 0–99)
NonHDL: 62.34
TRIGLYCERIDES: 94 mg/dL (ref 0.0–149.0)
Total CHOL/HDL Ratio: 3
VLDL: 18.8 mg/dL (ref 0.0–40.0)

## 2016-09-06 LAB — CBC WITH DIFFERENTIAL/PLATELET
BASOS ABS: 0 10*3/uL (ref 0.0–0.1)
Basophils Relative: 0.3 % (ref 0.0–3.0)
EOS ABS: 0.1 10*3/uL (ref 0.0–0.7)
Eosinophils Relative: 1.6 % (ref 0.0–5.0)
HEMATOCRIT: 46.5 % (ref 39.0–52.0)
HEMOGLOBIN: 15.9 g/dL (ref 13.0–17.0)
LYMPHS PCT: 16.7 % (ref 12.0–46.0)
Lymphs Abs: 1.3 10*3/uL (ref 0.7–4.0)
MCHC: 34.2 g/dL (ref 30.0–36.0)
MCV: 90.8 fl (ref 78.0–100.0)
MONOS PCT: 8.6 % (ref 3.0–12.0)
Monocytes Absolute: 0.7 10*3/uL (ref 0.1–1.0)
Neutro Abs: 5.7 10*3/uL (ref 1.4–7.7)
Neutrophils Relative %: 72.8 % (ref 43.0–77.0)
Platelets: 124 10*3/uL — ABNORMAL LOW (ref 150.0–400.0)
RBC: 5.12 Mil/uL (ref 4.22–5.81)
RDW: 13.8 % (ref 11.5–15.5)
WBC: 7.8 10*3/uL (ref 4.0–10.5)

## 2016-09-06 LAB — COMPREHENSIVE METABOLIC PANEL
ALBUMIN: 4.4 g/dL (ref 3.5–5.2)
ALK PHOS: 64 U/L (ref 39–117)
ALT: 24 U/L (ref 0–53)
AST: 21 U/L (ref 0–37)
BILIRUBIN TOTAL: 0.8 mg/dL (ref 0.2–1.2)
BUN: 12 mg/dL (ref 6–23)
CALCIUM: 9.6 mg/dL (ref 8.4–10.5)
CO2: 27 mEq/L (ref 19–32)
CREATININE: 0.98 mg/dL (ref 0.40–1.50)
Chloride: 107 mEq/L (ref 96–112)
GFR: 82.57 mL/min (ref >60.00)
Glucose, Bld: 136 mg/dL — ABNORMAL HIGH (ref 70–99)
Potassium: 4.3 mEq/L (ref 3.5–5.1)
Sodium: 139 mEq/L (ref 135–145)
TOTAL PROTEIN: 6.7 g/dL (ref 6.0–8.3)

## 2016-09-06 LAB — HEMOGLOBIN A1C: Hgb A1c MFr Bld: 7.2 % — ABNORMAL HIGH (ref 4.6–6.5)

## 2016-09-06 NOTE — Progress Notes (Signed)
Pre visit review using our clinic review tool, if applicable. No additional management support is needed unless otherwise documented below in the visit note. 

## 2016-09-06 NOTE — Progress Notes (Signed)
Office Note 09/06/2016  CC:  Chief Complaint  Patient presents with  . Annual Exam    Pt is fasting.     HPI:  Mark Blankenship is a 61 y.o. White male who is here for annual health maintenance exam. Doing well.  No exercise. Eats diabetic diet but admits VOLUME of food ingestion has gone up over the last 4 mo or so.   Past Medical History:  Diagnosis Date  . Arthritis   . Ascending aorta dilatation (HCC)    a. Ascending aorta mildly dilated by echo 09/03/15.  Marland Kitchen BPH (benign prostatic hyperplasia)    with hx of acute prostatitis  . CAD (coronary artery disease)    a. '05 MI > DES to circumflex. b. NSTEMI 08/2015 with total occlusion of the mid LAD along with 20% mid-distal Cx, 100% dCX, 40% D1, 50% pCx, 50% m-dRCA, 80% dRCA.Given >12 hours out from symptom onset, medical therapy recommended.  . Diabetes mellitus type 2, controlled ([place])    HbA1c 02/2011 was 6.6%  . Elevated PSA 09/2011   with prostate nodule.  Prostate bx ALL BENIGN 10/10/11 (Dr. Christella Hartigan followed by Urol.  Marland Kitchen GERD (gastroesophageal reflux disease)    occasional  . Headache(784.0)    cluster headaches a long time ago  . History of bladder cancer 2012   Mass removed, then BCG by urologist (Dr. Alinda Money).  Recurrence documented on cysto + bladder washings 10/14/15--getting more maintenance BCG.  Cysto 01/20/16 OK, then more BCG given.  Cysto 04/13/16 c/w just having finished BCG but cytology/FISH ok.  Cysto 07/27/2016 c/w small area of BCG related inflamm vs recurrent carcinoma in situ--washings for cytology done.    Marland Kitchen History of bronchitis   . History of vertigo   . HTN (hypertension)   . Hyperlipidemia   . Ischemic cardiomyopathy    a. 2D Echo 09/03/15: mild LVH, EF 40-45%, diffuse HK, akinesis of apical myocardium, grade 1 DD, aortic root dimention 28mm, ascending aorta mildly dilated, mildly dilated RV/RA.   Marland Kitchen Myocardial infarction 2005; 08/2015   followed by Dr. Stanford Breed  . Thrombocytopenia ([place])    Stable  2012-2017  . Tobacco abuse   . Urinary tract infection   . Wolf-Parkinson-White syndrome     Past Surgical History:  Procedure Laterality Date  . BLADDER TUMOR EXCISION  11/08/10; 05/2015   urothelial carcinoma (Dr. Alinda Money).  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  Marland Kitchen CARDIAC CATHETERIZATION  2005   with stent placement  . CARDIAC CATHETERIZATION N/A 09/01/2015   Procedure: Left Heart Cath and Coronary Angiography;  Surgeon: Belva Crome, MD;  Location: [facility];  Service: Cardiovascular;  Laterality: N/A;  . COLONOSCOPY  2010   Dr. Isa Rankin, repeat 2020.  Patient wants to go to different GI when time for repeat.  . CYSTOSCOPY W/ RETROGRADES Bilateral 06/22/2015   Procedure: CYSTOSCOPY WITH RETROGRADE PYELOGRAM;  Surgeon: Raynelle Bring, MD;  [facility];  Service: Urology;  Laterality: Bilateral;  . CYSTOSCOPY WITH RETROGRADE PYELOGRAM, URETEROSCOPY AND STENT PLACEMENT N/A 06/06/2013   Procedure: CYSTOSCOPY WITH RETROGRADE PYELOGRAM;  Surgeon: Dutch Gray, MD;  Location: [facility];  Service: Urology;  Laterality: N/A;  . lexiscan  2012   Normal/negative  . [place] in [place] ([place])-no trouble since last surgery  . myocardial perfusion study  2009   Neg ischemia, EF 52%  . TRANSURETHRAL RESECTION OF BLADDER TUMOR N/A 06/06/2013  Procedure: TRANSURETHRAL RESECTION OF BLADDER TUMOR (TURBT);  Surgeon: Dutch Gray, MD;  Location[facility];  Service: Urology;  Laterality: N/A;.  PATH: squamous metaplasia, fibrosis with chronic inflammation, no malignancy.  . TRANSURETHRAL RESECTION OF BLADDER TUMOR N/A 06/22/2015   Carcinoma in situ: no invasive malignancy.  Procedure: TRANSURETHRAL RESECTION OF BLADDER TUMOR (TURBT);  Surgeon: Raynelle Bring, MD;  [facility];  Service: Urology;  Laterality: N/A;  **BLADDER BIOPSY*RESECTION**  . VASECTOMY      Family History  Problem Relation Age of Onset  .  Hypertension Mother   . Cancer Father     colon and prostate, mets to lungs.  . Hypertension Father   . Heart disease Brother     d. 50 MI  . Multiple sclerosis Brother   . Diabetes Brother   . Heart disease Paternal Uncle     Social History   Social History  . Marital status: Married    Spouse name: N/A  . Number of children: N/A  . Years of education: N/A   Occupational History  . Not on file.   Social History Main Topics  . Smoking status: Former Smoker    Packs/day: 0.25    Years: 40.00    Types: Cigarettes    Quit date: 09/05/2015  . Smokeless tobacco: Never Used  . Alcohol use No  . Drug use: No  . Sexual activity: Not on file   Other Topics Concern  . Not on file   Social History Narrative   Married, 2 children, 4 grandchildren--live all [place] the same farm now.   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   Tob: 30 pack-yr hx, quit 08/2011.   No alcohol or drug use.   Active on farm but no formal exercise regimen.    Outpatient Medications Prior to Visit  Medication Sig Dispense Refill  . acetaminophen (TYLENOL) 500 MG tablet Take 1,000 mg by mouth every 6 (six) hours as needed for mild pain or headache.     Marland Kitchen aspirin EC 81 MG tablet Take 81 mg by mouth daily.    Marland Kitchen atenolol (TENORMIN) 50 MG tablet TAKE ONE TABLET BY MOUTH EVERY DAY WITH LUNCH 90 tablet 3  . atorvastatin (LIPITOR) 80 MG tablet Take 1 tablet (80 mg total) by mouth every evening. 90 tablet 2  . clopidogrel (PLAVIX) 75 MG tablet Take 1 tablet (75 mg total) by mouth daily. 90 tablet 2  . fluticasone (FLONASE) 50 MCG/ACT nasal spray Place 1 spray into both nostrils daily as needed for allergies. 16 g 3  . glipiZIDE (GLIPIZIDE XL) 10 MG 24 hr tablet TAKE ONE TABLET BY MOUTH EVERY DAY WITH LUNCH 90 tablet 1  . lisinopril (PRINIVIL,ZESTRIL) 20 MG tablet TAKE ONE TABLET BY MOUTH EVERY DAY WITH LUNCH 90 tablet 3  .  metFORMIN (GLUCOPHAGE) 1000 MG tablet TAKE ONE TABLET BY MOUTH TWICE DAILY WITH A MEAL 180 tablet 1  . nitroGLYCERIN (NITROSTAT) 0.4 MG SL tablet Place 1 tablet (0.4 mg total) under the tongue every 5 (five) minutes as needed for chest pain (up to 3 doses). 25 tablet 3  . oxybutynin (DITROPAN) 5 MG tablet Take 5 mg by mouth 3 (three) times daily.    . phenazopyridine (PYRIDIUM) 200 MG tablet Take 1 tablet (200 mg total) by mouth 3 (three) times daily as needed (burning with urination). 21 tablet 0  . tamsulosin (FLOMAX) 0.4 MG CAPS  capsule TAKE 1 CAPSULE BY MOUTH EVERY DAY AFTER SUPPER 90 capsule 3   No facility-administered medications prior to visit.     No Known Allergies  ROS Review of Systems  Constitutional: Negative for appetite change, chills, fatigue and fever.  HENT: Negative for congestion, dental problem, ear pain and sore throat.   Eyes: Negative for discharge, redness and visual disturbance.  Respiratory: Negative for cough, chest tightness, shortness of breath and wheezing.   Cardiovascular: Negative for chest pain, palpitations and leg swelling.  Gastrointestinal: Negative for abdominal pain, blood in stool, diarrhea, nausea and vomiting.  Genitourinary: Negative for difficulty urinating, dysuria, flank pain, frequency, hematuria and urgency.  Musculoskeletal: Negative for arthralgias, back pain, joint swelling, myalgias and neck stiffness.  Skin: Negative for pallor and rash.  Neurological: Negative for dizziness, speech difficulty, weakness and headaches.  Hematological: Negative for adenopathy. Does not bruise/bleed easily.  Psychiatric/Behavioral: Negative for confusion and sleep disturbance. The patient is not nervous/anxious.     PE; Blood pressure 114/76, pulse (!) 56, temperature 97.9 F (36.6 C), temperature source Oral, resp. rate 16, height 5' 6.5" (1.689 m), weight 185 lb 8 oz (84.1 kg), SpO2 96 %. Gen: Alert, well appearing.  Patient is oriented to person,  place, time, and situation. AFFECT: pleasant, lucid thought and speech. ENT: Ears: EACs clear, normal epithelium.  TMs with good light reflex and landmarks bilaterally.  Eyes: no injection, icteris, swelling, or exudate.  EOMI, PERRLA. Nose: no drainage or turbinate edema/swelling.  No injection or focal lesion.  Mouth: lips without lesion/swelling.  Oral mucosa pink and moist.  Dentition intact and without obvious caries or gingival swelling.  Oropharynx without erythema, exudate, or swelling.  Neck: supple/nontender.  No LAD, mass, or TM.  Carotid pulses 2+ bilaterally, without bruits. CV: RRR, no m/r/g.   LUNGS: CTA bilat, nonlabored resps, good aeration in all lung fields. ABD: soft, NT, ND, BS normal.  No hepatospenomegaly or mass.  No bruits. EXT: no clubbing, cyanosis, or edema.  Musculoskeletal: no joint swelling, erythema, warmth, or tenderness.  ROM of all joints intact. Skin - no sores or suspicious lesions or rashes or color changes  Pertinent labs:  Lab Results  Component Value Date   TSH 0.96 08/26/2015   Lab Results  Component Value Date   WBC 7.0 09/03/2015   HGB 15.5 09/03/2015   HCT 43.8 09/03/2015   MCV 88.0 09/03/2015   PLT 112 (L) 09/03/2015   Lab Results  Component Value Date   CREATININE 1.10 01/06/2016   BUN 22 01/06/2016   NA 138 01/06/2016   K 4.1 01/06/2016   CL 105 01/06/2016   CO2 25 01/06/2016   Lab Results  Component Value Date   ALT 20 10/19/2015   AST 20 10/19/2015   ALKPHOS 59 10/19/2015   BILITOT 0.9 10/19/2015   Lab Results  Component Value Date   CHOL 80 (L) 10/19/2015   Lab Results  Component Value Date   HDL 24 (L) 10/19/2015   Lab Results  Component Value Date   LDLCALC 41 10/19/2015   Lab Results  Component Value Date   TRIG 75 10/19/2015   Lab Results  Component Value Date   CHOLHDL 3.3 10/19/2015   Lab Results  Component Value Date   HGBA1C 6.2 05/06/2016   ASSESSMENT AND PLAN:   Health maintenance  exam: Reviewed age and gender appropriate health maintenance issues (prudent diet, regular exercise, health risks of tobacco and excessive alcohol, use of seatbelts, fire alarms  in home, use of sunscreen).  Also reviewed age and gender appropriate health screening as well as vaccine recommendations. Vacc's UTD. Fasting HP + Hep C screen drawn today.  HbA1c drawn today for his DM 2 monitoring. Prostate ca screening: hx of prostate nodule w/benign bx; DRE & PSA's followed by urologist. Colon cancer screening: next colonoscopy due 2020.  An After Visit Summary was printed and given to the patient.  FOLLOW UP:  Return in about 4 months (around 01/04/2017) for routine chronic illness f/u.  Signed:  Crissie Sickles, MD           09/06/2016

## 2016-09-07 ENCOUNTER — Encounter: Payer: Self-pay | Admitting: *Deleted

## 2016-09-07 LAB — HEPATITIS C ANTIBODY: HCV Ab: NEGATIVE

## 2016-09-07 NOTE — Telephone Encounter (Signed)
Pt advised and voiced understanding.   

## 2016-09-07 NOTE — Telephone Encounter (Signed)
Yes, he has had a very mildly low platelet level since 2012 that has been stable.  This is not low enough to be clinically significant and I will continue to monitor the level periodically.  Pls reassure pt.--thx

## 2016-09-07 NOTE — Telephone Encounter (Signed)
Please advise.   Pt asked if platelet count is low?  He also stated that he is going to work on his diet to improve his blood sugar.

## 2016-09-15 ENCOUNTER — Other Ambulatory Visit: Payer: Self-pay

## 2016-09-15 ENCOUNTER — Other Ambulatory Visit: Payer: Self-pay | Admitting: Family Medicine

## 2016-09-15 MED ORDER — NITROGLYCERIN 0.4 MG SL SUBL: 0.4000 mg | SUBLINGUAL_TABLET | SUBLINGUAL | 3 refills | Status: DC | PRN

## 2016-09-15 NOTE — Telephone Encounter (Signed)
[place].  RF request for lisinopril LOV: 09/06/16 Next ov: 01/03/17 Last written: 09/21/15 #90 w/ 3RF

## 2016-09-15 NOTE — Telephone Encounter (Signed)
Rx(s) sent to pharmacy electronically.  

## 2016-10-12 ENCOUNTER — Other Ambulatory Visit: Payer: Self-pay | Admitting: Family Medicine

## 2016-11-02 ENCOUNTER — Telehealth: Payer: Self-pay | Admitting: Cardiology

## 2016-11-02 DIAGNOSIS — C678 Malignant neoplasm of overlapping sites of bladder: Secondary | ICD-10-CM | POA: Diagnosis not present

## 2016-11-02 NOTE — Telephone Encounter (Signed)
Returned call to patient Spoke with wife Shirlean Mylar Patient needs bladder biopsy w/Dr. Alinda Money Patient will need to come off Plavix prior to procedure  Will defer to MD/RN

## 2016-11-02 NOTE — Telephone Encounter (Signed)
Spoke with pt wife, Aware of dr crenshaw's recommendations.  

## 2016-11-02 NOTE — Telephone Encounter (Signed)
Ok to hold plavix 7 days prior to procedure and resume after Mark Blankenship  

## 2016-11-02 NOTE — Telephone Encounter (Signed)
New Message  Pts wife voiced wanting to speak with Md-Crenshaw's nurse in regards to an upcoming procedure.  Please f/u

## 2016-11-03 ENCOUNTER — Telehealth: Payer: Self-pay | Admitting: Cardiology

## 2016-11-03 ENCOUNTER — Encounter: Payer: Self-pay | Admitting: Family Medicine

## 2016-11-03 NOTE — Telephone Encounter (Signed)
New message      Request for surgical clearance:  What type of surgery is being performed? TURBT 1. When is this surgery scheduled?  Pending surgery  Are there any medications that need to be held prior to surgery and how long? Hold plavix and aspirin prior to procedure?  Also, need cardiac clearance 2. Name of physician performing surgery?  Dr Alinda Money  What is your office phone and fax number?  [phone]

## 2016-11-03 NOTE — Telephone Encounter (Signed)
Ok for surgery and hold asa and plavix 5 days prior to procedure and resume after Kirk Ruths

## 2016-11-04 ENCOUNTER — Other Ambulatory Visit: Payer: Self-pay | Admitting: Urology

## 2016-11-04 NOTE — Telephone Encounter (Signed)
Call returned to [facility] at [facility] and fax sent for surgical clearance and instructions.  Ok for surgery and hold asa and plavix 5 days prior to procedure and resume after Mark Blankenship

## 2016-11-07 NOTE — Progress Notes (Signed)
Note in epic with cardiology clearance 11-03-16 Dr Stanford Breed   EKG 05-31-16 epic ECHO 09-03-15 epic

## 2016-11-07 NOTE — Patient Instructions (Addendum)
Mark Blankenship  11/07/2016   Your procedure is scheduled on: 11-17-16  Report to [facility] Main  Entrance  Take [facility]  elevators to 3rd floor to  [place] at 11:15AM.   Call this number if you have problems the morning of surgery [phone]    Remember: ONLY 1 PERSON MAY GO WITH YOU TO SHORT STAY TO GET  READY MORNING OF [place].  Do not eat food After Midnight. You may have clear liquids from midnight until 815am day of surgery. Nothing by mouth after 715am!!     Take these medicines the morning of surgery with A SIP OF WATER: atenolol(tenormin), tylenol as needed, tamsulosin(flomax), nasal spray as needed  DO NOT TAKE ANY DIABETIC MEDICATIONS DAY OF YOUR SURGERY                               You may not have any metal on your body including hair pins and              piercings  Do not wear jewelry, make-up, lotions, powders or perfumes, deodorant              Men may shave face and neck.   Do not bring valuables to the hospital. [place].  Contacts, dentures or bridgework may not be worn into surgery.       Patients discharged the day of surgery will not be allowed to drive home.  Name and phone number of your driver:  Special Instructions: N/A              Please read over the following fact sheets you were given: _____________________________________________________________________    CLEAR LIQUID DIET   Foods Allowed                                                                     Foods Excluded  Coffee and tea, regular and decaf                             liquids that you cannot  Plain Jell-O in any flavor                                             see through such as: Fruit ices (not with fruit pulp)                                     milk, soups, orange juice  Iced Popsicles                                    All solid food Carbonated beverages, regular and diet  Cranberry, grape and apple juices Sports drinks like Gatorade Lightly seasoned clear broth or consume(fat free) Sugar, honey syrup  Sample Menu Breakfast                                Lunch                                     Supper Cranberry juice                    Beef broth                            Chicken broth Jell-O                                     Grape juice                           Apple juice Coffee or tea                        Jell-O                                      Popsicle                                                Coffee or tea                        Coffee or tea  _____________________________________________________________________  How to Manage Your Diabetes Before and After Surgery  Why is it important to control my blood sugar before and after surgery? . Improving blood sugar levels before and after surgery helps healing and can limit problems. . A way of improving blood sugar control is eating a healthy diet by: o  Eating less sugar and carbohydrates o  Increasing activity/exercise o  Talking with your doctor about reaching your blood sugar goals . High blood sugars (greater than 180 mg/dL) can raise your risk of infections and slow your recovery, so you will need to focus on controlling your diabetes during the weeks before surgery. . Make sure that the doctor who takes care of your diabetes knows about your planned surgery including the date and location.  How do I manage my blood sugar before surgery? . Check your blood sugar at least 4 times a day, starting 2 days before surgery, to make sure that the level is not too high or low. o Check your blood sugar the morning of your surgery when you wake up and every 2 hours until you get to the Short Stay unit. . If your blood sugar is less than 70 mg/dL, you will need to treat for low blood sugar: o Do not take insulin. o Treat a low blood sugar (less than 70 mg/dL)  with  cup of clear juice (cranberry or apple), 4 glucose tablets, OR glucose gel. o  Recheck blood sugar in 15 minutes after treatment (to make sure it is greater than 70 mg/dL). If your blood sugar is not greater than 70 mg/dL on recheck, call [phone] for further instructions. . Report your blood sugar to the short stay nurse when you get to Short Stay.  . If you are admitted to the hospital after surgery: o Your blood sugar will be checked by the staff and you will probably be given insulin after surgery (instead of oral diabetes medicines) to make sure you have good blood sugar levels. o The goal for blood sugar control after surgery is 80-180 mg/dL.   WHAT DO I DO ABOUT MY DIABETES MEDICATION?   . THE DAY BEFORE SURGERY 11-16-16, take  Your GLIPIZIDE and METFORMIN as usual       . THE MORNING OF SURGERY 11-17-16 , Do not take oral diabetes medicines (pills)   Patient Signature:  Date:   Nurse Signature:  Date:   Reviewed and Endorsed by [facility] Patient Education Committee, August 2015            [facility] - Preparing for Surgery Before surgery, you can play an important role.  Because skin is not sterile, your skin needs to be as free of germs as possible.  You can reduce the number of germs on your skin by washing with CHG (chlorahexidine gluconate) soap before surgery.  CHG is an antiseptic cleaner which kills germs and bonds with the skin to continue killing germs even after washing. Please DO NOT use if you have an allergy to CHG or antibacterial soaps.  If your skin becomes reddened/irritated stop using the CHG and inform your nurse when you arrive at Short Stay. Do not shave (including legs and underarms) for at least 48 hours prior to the first CHG shower.  You may shave your face/neck. Please follow these instructions carefully:  1.  Shower with CHG Soap the night before surgery and the  morning of Surgery.  2.  If you choose to wash your hair, wash your hair  first as usual with your  normal  shampoo.  3.  After you shampoo, rinse your hair and body thoroughly to remove the  shampoo.                           4.  Use CHG as you would any other liquid soap.  You can apply chg directly  to the skin and wash                       Gently with a scrungie or clean washcloth.  5.  Apply the CHG Soap to your body ONLY FROM THE NECK DOWN.   Do not use on face/ open                           Wound or open sores. Avoid contact with eyes, ears mouth and genitals (private parts).                       Wash face,  Genitals (private parts) with your normal soap.             6.  Wash thoroughly, paying special attention to the area where your surgery  will be performed.  7.  Thoroughly rinse your body with warm water from the neck down.  8.  DO  NOT shower/wash with your normal soap after using and rinsing off  the CHG Soap.                9.  Pat yourself dry with a clean towel.            10.  Wear clean pajamas.            11.  Place clean sheets on your bed the night of your first shower and do not  sleep with pets. Day of Surgery : Do not apply any lotions/deodorants the morning of surgery.  Please wear clean clothes to the hospital/surgery center.  FAILURE TO FOLLOW THESE INSTRUCTIONS MAY RESULT IN THE CANCELLATION OF YOUR SURGERY PATIENT SIGNATURE_________________________________  NURSE SIGNATURE__________________________________  ________________________________________________________________________

## 2016-11-08 ENCOUNTER — Encounter (HOSPITAL_COMMUNITY): Payer: Self-pay

## 2016-11-08 ENCOUNTER — Encounter (HOSPITAL_COMMUNITY)
Admission: RE | Admit: 2016-11-08 | Discharge: 2016-11-08 | Disposition: A | Discharge status: Home / Self Care | Payer: BLUE CROSS/BLUE SHIELD | Source: Ambulatory Visit | Attending: Urology | Admitting: Urology

## 2016-11-08 DIAGNOSIS — Z01812 Encounter for preprocedural laboratory examination: Secondary | ICD-10-CM | POA: Diagnosis not present

## 2016-11-08 LAB — CBC
HCT: 43.5 % (ref 39.0–52.0)
Hemoglobin: 15.4 g/dL (ref 13.0–17.0)
MCH: 30.6 pg (ref 26.0–34.0)
MCHC: 35.4 g/dL (ref 30.0–36.0)
MCV: 86.3 fL (ref 78.0–100.0)
Platelets: 113 K/uL — ABNORMAL LOW (ref 150–400)
RBC: 5.04 MIL/uL (ref 4.22–5.81)
RDW: 13.5 % (ref 11.5–15.5)
WBC: 7.1 K/uL (ref 4.0–10.5)

## 2016-11-08 LAB — BASIC METABOLIC PANEL WITH GFR
Anion gap: 9 (ref 5–15)
BUN: 15 mg/dL (ref 6–20)
CO2: 24 mmol/L (ref 22–32)
Calcium: 9.5 mg/dL (ref 8.9–10.3)
Chloride: 103 mmol/L (ref 101–111)
Creatinine, Ser: 1.09 mg/dL (ref 0.61–1.24)
GFR calc Af Amer: >60 mL/min
GFR calc non Af Amer: >60 mL/min
Glucose, Bld: 139 mg/dL — ABNORMAL HIGH (ref 65–99)
Potassium: 4.3 mmol/L (ref 3.5–5.1)
Sodium: 136 mmol/L (ref 135–145)

## 2016-11-08 LAB — GLUCOSE, CAPILLARY: GLUCOSE-CAPILLARY: 147 mg/dL — AB (ref 65–99)

## 2016-11-08 NOTE — Progress Notes (Signed)
CBC results routed via epic to Dr Raynelle Bring

## 2016-11-09 LAB — HEMOGLOBIN A1C
HEMOGLOBIN A1C: 6.8 % — AB (ref 4.8–5.6)
MEAN PLASMA GLUCOSE: 148 mg/dL

## 2016-11-16 NOTE — H&P (Signed)
Office Visit Report     11/02/2016   --------------------------------------------------------------------------------   Mark Blankenship  MRN: [id number]  PRIMARY CARE:  Shawnie Dapper, MD  DOB: [birth year], 61 year old Male  REFERRING:    SSN: [id number]  PROVIDER:  Raynelle Bring, M.D.    LOCATION:  [facility] [phone]   --------------------------------------------------------------------------------   CC/HPI: High-risk non-muscle invasive bladder cancer   He returns today for cystoscopic surveillance. We did stop BCG therapy following his last visit due to severe lower urinary tract symptoms. He states that these symptoms have continued to gradually improve although still remained significant. They tend to flare intermittently. He continues to take tamsulosin with some improvement. He denies any recent hematuria.     ALLERGIES: No Allergies    MEDICATIONS: Oxybutynin Chloride 5 mg tablet 1 tablet PO TID PRN  Aspirin Ec 81 mg tablet, delayed release Oral  Atenolol TABS Oral  Fluticasone Propionate 50 mcg/actuation spray, suspension Nasal  GlipiZIDE XL 5 MG Oral Tablet Extended Release 24 Hour Oral  Lipitor 80 mg tablet Oral  Lisinopril TABS Oral  Metformin Hcl 1,000 mg tablet Oral  Plavix 300 mg tablet Oral  Tamsulosin Hcl 0.4 mg capsule, ext release 24 hr 0 Oral  Tramadol Hcl 50 mg tablet 1 tablet PO Q 6 H PRN     GU PSH: Bladder Instill AntiCA Agent - 03/29/2016, 03/21/2016, 03/07/2016 Cysto Bladder Ureth Biopsy - 06/30/2015 Cystoscopy - 07/27/2016, 04/13/2016, 01/20/2016 Cystoscopy TURBT <2 cm - 2012 Cystoscopy TURBT >5 cm - 2012 Cystoscopy TURBT 2-5 cm - 2014 Vasectomy - 2010      PSH Notes: Cystoscopy With Biopsy, Cystoscopy With Fulguration Medium Lesion (2-5cm), Cystoscopy With Fulguration Small Lesion (5-12mm), Cystoscopy With Fulguration Large Lesion (Over 5cm), Cath Stent 1 Type Drug-Eluting, Surgery Of Male Genitalia Vasectomy, Back Surgery    NON-GU PSH: None   GU PMH: Urinary Frequency (Worsening) - 04/25/2016, Urinary frequency, - 2014 Dysuria (Worsening, Chronic), Culture urine. No ABX unless culture proven UTI. Urine specimen today is much better than last week with BCG treatment. Will have pt try PRN Tramadol for dysuria. - 04/06/2016, Dysuria, - 09/24/2015 Incomplete bladder emptying, Increase Tamsulosin to BID - 04/06/2016 Elevated PSA - 01/20/2016, Elevated prostate specific antigen (PSA), - 05/06/2015 Bladder Cancer, overlapping sites, Malignant neoplasm of overlapping sites of bladder - 10/07/2015 Urinary Retention, Other retention of urine - 09/24/2015 BPH w/LUTS, Benign prostatic hyperplasia (BPH) with straining on urination - 05/06/2015 Male ED, unspecified, Erectile dysfunction - 2015 Urinary Tract Inf, Unspec site, Urinary tract infection - 2014 Acute prostatitis, Prostatitis, acute - 2014 Nodular prostate w/o LUTS, Nodular prostate without lower urinary tract symptoms - 2014 Other microscopic hematuria, Microscopic hematuria - 2014      PMH Notes:   1) BPH/LUTS: He presented in November 2010 with symptoms including dysuria, new unconscious incontinence, frequency, urgency, and nocturia. He was felt to most likely have acute prostatitis and was treated with ciprofloxacin and tamsulosin. His symptoms mostly resolved after antibiotic treatment except for mild nocturia and frequency improved on alpha blocker therapy.   Current treatment: Tamsulosin 0.4 mg   2) Urothelial carcinoma of the bladder: He presented with voiding symptoms and microscopic hematuria in January 2012 and was found to have a bladder tumor on cystoscopy. TURBT was performed in March 2012 (delayed due to acute chest pain and cardiac evaluation) revealed high grade T1 urothelial carcinoma with concomitant CIS of the bladder. Repeat staging TUR in April 2012 did not demonstrate further  disease.   Mar 2012: TUR - High grade, T1  Apr 2012: Restaging TUR - no  residual tumor  May-June 2012: Induction BCG  Sept-Oct 2012: Maintenance BCG  Dec 2012-Jan 2013: Maintenance BCG  Jun-Jul 2013: Maintenance BCG  Nov 2014: Biopsy of bladder - squamous metaplasia (no malignancy)  Dec 2016: TUR/Bx - CIS  Dec 2016-Jan 2017 - 6 week induction BCG  Apr 2017: 3 week maintenance BCG  Aug-Sep 2017: 3 week maintenance BCG   3) Elevated PSA/ prostate nodule: His PSA increased to 4.6 in March 2013 and he was found to have a new right mid prostate nodule prompting a prostate biopsy which was benign.   Family history: Positive (father)   Mar 2013 (12 core): Benign, Vol 31 cc   4) Erectile dysfunction: He initially complained of erectile dysfunction in 2014. His risk factors include a history of diabetes and smoking.   Current treatment: Trial of Stendra   2009-06-05 12:25:47 - Note: Acute Myocardial Infarction  2009-06-05 12:25:47 - Note: Arthritis   NON-GU PMH: Myocardial Infarction, History of myocardial infarction - 10/14/2015 Diabetes Type 2 Hypercholesterolemia Hypertension    FAMILY HISTORY: Diabetes - Mother Lung Cancer - Father Prostate Cancer - Father   SOCIAL HISTORY: Marital Status: Married Current Smoking Status: Patient smokes. Smokes 1 pack per day.  Social Drinker.     REVIEW OF SYSTEMS:    GU Review Male:   Patient reports frequent urination, hard to postpone urination, burning/ pain with urination, get up at night to urinate, leakage of urine, and stream starts and stops. Patient denies trouble starting your streams and have to strain to urinate .  Gastrointestinal (Lower):   Patient denies diarrhea and constipation.  Gastrointestinal (Upper):   Patient denies nausea and vomiting.  Constitutional:   Patient denies fever, night sweats, weight loss, and fatigue.  Skin:   Patient denies skin rash/ lesion and itching.  Eyes:   Patient denies blurred vision and double vision.  Ears/ Nose/ Throat:   Patient denies sore throat and sinus  problems.  Hematologic/Lymphatic:   Patient denies swollen glands and easy bruising.  Cardiovascular:   Patient denies chest pains and leg swelling.  Respiratory:   Patient denies cough and shortness of breath.  Endocrine:   Patient denies excessive thirst.  Musculoskeletal:   Patient denies back pain and joint pain.  Neurological:   Patient denies headaches and dizziness.  Psychologic:   Patient denies depression and anxiety.   VITAL SIGNS:      11/02/2016 02:41 PM  BP 111/72 mmHg  Pulse 59 /min   GU PHYSICAL EXAMINATION:    Urethral Meatus: Normal size. No lesion, no wart, no polyp, no balanitis, no discharge. Normal location.    MULTI-SYSTEM PHYSICAL EXAMINATION:    Constitutional: Well-nourished. No physical deformities. Normally developed. Good grooming.  Respiratory: No labored breathing, no use of accessory muscles.   Cardiovascular: Normal temperature, normal extremity pulses, no swelling, no varicosities.     PAST DATA REVIEWED:  Source Of History:  Patient  Urine Test Review:   Urinalysis   01/13/16 10/08/15 11/02/13 05/23/13 01/14/13 11/08/12 04/25/12 09/23/11  PSA  Total PSA 2.25  6.89  2.25  3.07  4.72  5.69  3.91  4.60   Free PSA  0.46    0.58  0.71   0.68   % Free PSA  7    12  12   15      PROCEDURES:  Flexible Cystoscopy - 52000  Indication: Bladder cancer Risks, benefits, and potential complications of the procedure were discussed with the patient including infection, bleeding, voiding discomfort, urinary retention, fever, chills, sepsis, and others. All questions were answered. Informed consent was obtained. Sterile technique and intraurethral analgesia were used.  Meatus:  Normal size. Normal location. Normal condition.  Urethra:  No strictures.  External Sphincter:  Normal.  Verumontanum:  Normal.  Prostate:  Non-obstructing. No hyperplasia.  Bladder Neck:  Non-obstructing.  Ureteral Orifices:  Normal location. Normal size. Normal shape. Effluxed  clear urine.  Bladder:  A systematic examination of the bladder was performed. This revealed a 1.5 cm raised erythematous lesion toward the left posterior bladder just beyond the trigone. No other bladder tumors, stones, or other mucosal pathology was identified.      Chaperone: AJ The procedure was well-tolerated and without complications. Instructions were given to call the office immediately if questions or problems.         Urinalysis Dipstick Dipstick Cont'd  Color: Yellow Bilirubin: Neg  Appearance: Clear Ketones: Neg  Specific Gravity: 1.020 Blood: Neg  pH: 5.5 Protein: Neg  Glucose: Neg Urobilinogen: 0.2    Nitrites: Neg    Leukocyte Esterase: Neg    ASSESSMENT:      ICD-10 Details  1 GU:   Bladder Cancer, overlapping sites - C67.8    PLAN:           Orders Labs Urine Cytology  Lab Notes: Bladder washing          Schedule Return Visit/Planned Activity: Other See Visit Notes             Note: We'll schedule surgery.          Document Letter(s):  Created for Patient: Clinical Summary         Notes:   1. High risk not muscle invasive bladder cancer: We discussed his cystoscopic findings today which are concerning for possible recurrence versus inflammatory change related to prior BCG therapy. Considering that he is now been off BCG for significant period of time, I have recommended we proceed with cystoscopy and biopsy/transurethral resection for further evaluation. We have reviewed this procedure in detail including potential risks and complications. This will be scheduled for the near future. He does have a drug-eluting stent and will continue aspirin 81 mg perioperatively but will stop his Plavix.   Cc: Dr. Ricardo Jericho    * Signed by Raynelle Bring, M.D. on 11/02/16 at 7:00 PM (EDT)*

## 2016-11-17 ENCOUNTER — Ambulatory Visit (HOSPITAL_COMMUNITY): Payer: BLUE CROSS/BLUE SHIELD | Admitting: Anesthesiology

## 2016-11-17 ENCOUNTER — Ambulatory Visit (HOSPITAL_COMMUNITY)
Admission: RE | Admit: 2016-11-17 | Discharge: 2016-11-17 | Disposition: A | Discharge status: Home / Self Care | Payer: BLUE CROSS/BLUE SHIELD | Source: Ambulatory Visit | Attending: Urology | Admitting: Urology

## 2016-11-17 ENCOUNTER — Encounter (HOSPITAL_COMMUNITY): Payer: Self-pay | Admitting: *Deleted

## 2016-11-17 ENCOUNTER — Encounter (HOSPITAL_COMMUNITY)
Admission: RE | Disposition: A | Discharge status: Home / Self Care | Payer: Self-pay | Source: Ambulatory Visit | Attending: Urology

## 2016-11-17 ENCOUNTER — Ambulatory Visit (HOSPITAL_COMMUNITY): Payer: BLUE CROSS/BLUE SHIELD

## 2016-11-17 DIAGNOSIS — N3090 Cystitis, unspecified without hematuria: Secondary | ICD-10-CM | POA: Diagnosis not present

## 2016-11-17 DIAGNOSIS — Z8551 Personal history of malignant neoplasm of bladder: Secondary | ICD-10-CM | POA: Insufficient documentation

## 2016-11-17 DIAGNOSIS — E1151 Type 2 diabetes mellitus with diabetic peripheral angiopathy without gangrene: Secondary | ICD-10-CM | POA: Insufficient documentation

## 2016-11-17 DIAGNOSIS — Z79899 Other long term (current) drug therapy: Secondary | ICD-10-CM | POA: Insufficient documentation

## 2016-11-17 DIAGNOSIS — I252 Old myocardial infarction: Secondary | ICD-10-CM | POA: Insufficient documentation

## 2016-11-17 DIAGNOSIS — I10 Essential (primary) hypertension: Secondary | ICD-10-CM | POA: Diagnosis not present

## 2016-11-17 DIAGNOSIS — R338 Other retention of urine: Secondary | ICD-10-CM | POA: Diagnosis not present

## 2016-11-17 DIAGNOSIS — D696 Thrombocytopenia, unspecified: Secondary | ICD-10-CM | POA: Diagnosis not present

## 2016-11-17 DIAGNOSIS — Z7984 Long term (current) use of oral hypoglycemic drugs: Secondary | ICD-10-CM | POA: Insufficient documentation

## 2016-11-17 DIAGNOSIS — R3914 Feeling of incomplete bladder emptying: Secondary | ICD-10-CM | POA: Diagnosis not present

## 2016-11-17 DIAGNOSIS — N323 Diverticulum of bladder: Secondary | ICD-10-CM | POA: Insufficient documentation

## 2016-11-17 DIAGNOSIS — Z7982 Long term (current) use of aspirin: Secondary | ICD-10-CM | POA: Insufficient documentation

## 2016-11-17 DIAGNOSIS — N3289 Other specified disorders of bladder: Secondary | ICD-10-CM | POA: Insufficient documentation

## 2016-11-17 DIAGNOSIS — I255 Ischemic cardiomyopathy: Secondary | ICD-10-CM | POA: Diagnosis not present

## 2016-11-17 DIAGNOSIS — N329 Bladder disorder, unspecified: Secondary | ICD-10-CM | POA: Diagnosis present

## 2016-11-17 DIAGNOSIS — Z7902 Long term (current) use of antithrombotics/antiplatelets: Secondary | ICD-10-CM | POA: Diagnosis not present

## 2016-11-17 DIAGNOSIS — E7800 Pure hypercholesterolemia, unspecified: Secondary | ICD-10-CM | POA: Insufficient documentation

## 2016-11-17 DIAGNOSIS — I2510 Atherosclerotic heart disease of native coronary artery without angina pectoris: Secondary | ICD-10-CM | POA: Diagnosis not present

## 2016-11-17 DIAGNOSIS — I214 Non-ST elevation (NSTEMI) myocardial infarction: Secondary | ICD-10-CM | POA: Diagnosis not present

## 2016-11-17 DIAGNOSIS — N401 Enlarged prostate with lower urinary tract symptoms: Secondary | ICD-10-CM | POA: Diagnosis not present

## 2016-11-17 DIAGNOSIS — N529 Male erectile dysfunction, unspecified: Secondary | ICD-10-CM | POA: Diagnosis not present

## 2016-11-17 DIAGNOSIS — D494 Neoplasm of unspecified behavior of bladder: Secondary | ICD-10-CM | POA: Diagnosis not present

## 2016-11-17 HISTORY — PX: TRANSURETHRAL RESECTION OF BLADDER TUMOR: SHX2575

## 2016-11-17 HISTORY — PX: CYSTOSCOPY W/ RETROGRADES: SHX1426

## 2016-11-17 LAB — GLUCOSE, CAPILLARY
Glucose-Capillary: 125 mg/dL — ABNORMAL HIGH (ref 65–99)
Glucose-Capillary: 139 mg/dL — ABNORMAL HIGH (ref 65–99)

## 2016-11-17 SURGERY — TURBT (TRANSURETHRAL RESECTION OF BLADDER TUMOR)
Anesthesia: General

## 2016-11-17 MED ORDER — PROPOFOL 10 MG/ML IV BOLUS
INTRAVENOUS | Status: AC
  Filled 2016-11-17: qty 20

## 2016-11-17 MED ORDER — CEFAZOLIN SODIUM-DEXTROSE 2-4 GM/100ML-% IV SOLN
2.0000 g | INTRAVENOUS | Status: AC
  Administered 2016-11-17: 2 g via INTRAVENOUS
  Filled 2016-11-17: qty 100

## 2016-11-17 MED ORDER — FENTANYL CITRATE (PF) 100 MCG/2ML IJ SOLN
INTRAMUSCULAR | Status: AC
  Filled 2016-11-17: qty 2

## 2016-11-17 MED ORDER — 0.9 % SODIUM CHLORIDE (POUR BTL) OPTIME
TOPICAL | Status: DC | PRN
  Administered 2016-11-17: 1000 mL

## 2016-11-17 MED ORDER — PROPOFOL 10 MG/ML IV BOLUS
INTRAVENOUS | Status: DC | PRN
  Administered 2016-11-17: 120 mg via INTRAVENOUS

## 2016-11-17 MED ORDER — SODIUM CHLORIDE 0.9 % IR SOLN
Status: DC | PRN
  Administered 2016-11-17: 6000 mL

## 2016-11-17 MED ORDER — MIDAZOLAM HCL 5 MG/5ML IJ SOLN
INTRAMUSCULAR | Status: DC | PRN
  Administered 2016-11-17: 2 mg via INTRAVENOUS

## 2016-11-17 MED ORDER — PHENAZOPYRIDINE HCL 100 MG PO TABS: 100.0000 mg | ORAL_TABLET | Freq: Three times a day (TID) | ORAL | 0 refills | Status: DC | PRN

## 2016-11-17 MED ORDER — EPHEDRINE SULFATE-NACL 50-0.9 MG/10ML-% IV SOSY
PREFILLED_SYRINGE | INTRAVENOUS | Status: DC | PRN
  Administered 2016-11-17: 10 mg via INTRAVENOUS

## 2016-11-17 MED ORDER — ATENOLOL 50 MG PO TABS
50.0000 mg | ORAL_TABLET | Freq: Once | ORAL | Status: DC
  Filled 2016-11-17: qty 1

## 2016-11-17 MED ORDER — MIDAZOLAM HCL 2 MG/2ML IJ SOLN
INTRAMUSCULAR | Status: AC
  Filled 2016-11-17: qty 2

## 2016-11-17 MED ORDER — LACTATED RINGERS IV SOLN
INTRAVENOUS | Status: DC
  Administered 2016-11-17 (×3): via INTRAVENOUS

## 2016-11-17 MED ORDER — DEXMEDETOMIDINE HCL 200 MCG/2ML IV SOLN
INTRAVENOUS | Status: DC | PRN
  Administered 2016-11-17 (×4): 8 ug via INTRAVENOUS
  Administered 2016-11-17: 12 ug via INTRAVENOUS

## 2016-11-17 MED ORDER — HYDROCODONE-ACETAMINOPHEN 5-325 MG PO TABS: 1.0000 | ORAL_TABLET | Freq: Four times a day (QID) | ORAL | 0 refills | Status: DC | PRN

## 2016-11-17 MED ORDER — IOHEXOL 300 MG/ML  SOLN
INTRAMUSCULAR | Status: DC | PRN
  Administered 2016-11-17: 16 mL

## 2016-11-17 MED ORDER — LIDOCAINE 2% (20 MG/ML) 5 ML SYRINGE
INTRAMUSCULAR | Status: DC | PRN
  Administered 2016-11-17: 80 mg via INTRAVENOUS

## 2016-11-17 MED ORDER — FENTANYL CITRATE (PF) 100 MCG/2ML IJ SOLN
INTRAMUSCULAR | Status: DC | PRN
  Administered 2016-11-17 (×4): 50 ug via INTRAVENOUS

## 2016-11-17 SURGICAL SUPPLY — 16 items
BAG URINE DRAINAGE (UROLOGICAL SUPPLIES) IMPLANT
BAG URO CATCHER STRL LF (MISCELLANEOUS) ×2 IMPLANT
CATH INTERMIT  6FR 70CM (CATHETERS) ×2 IMPLANT
CLOTH BEACON ORANGE TIMEOUT ST (SAFETY) ×2 IMPLANT
COVER SURGICAL LIGHT HANDLE (MISCELLANEOUS) ×2 IMPLANT
ELECT REM PT RETURN 15FT ADLT (MISCELLANEOUS) IMPLANT
EVACUATOR MICROVAS BLADDER (UROLOGICAL SUPPLIES) IMPLANT
GLOVE BIOGEL M STRL SZ7.5 (GLOVE) ×2 IMPLANT
GOWN STRL REUS W/TWL LRG LVL3 (GOWN DISPOSABLE) ×4 IMPLANT
GUIDEWIRE STR DUAL SENSOR (WIRE) IMPLANT
LOOP CUT BIPOLAR 24F LRG (ELECTROSURGICAL) ×2 IMPLANT
MANIFOLD NEPTUNE II (INSTRUMENTS) ×2 IMPLANT
PACK CYSTO (CUSTOM PROCEDURE TRAY) ×2 IMPLANT
SET ASPIRATION TUBING (TUBING) IMPLANT
SYRINGE IRR TOOMEY STRL 70CC (SYRINGE) IMPLANT
TUBING CONNECTING 10 (TUBING) ×2 IMPLANT

## 2016-11-17 NOTE — Anesthesia Procedure Notes (Signed)
Performed by: Lavina Hamman

## 2016-11-17 NOTE — Transfer of Care (Signed)
Immediate Anesthesia Transfer of Care Note  Patient: Mark Blankenship  Procedure(s) Performed: Procedure(s): TRANSURETHRAL RESECTION OF BLADDER TUMOR (TURBT) (N/A) CYSTOSCOPY WITH RETROGRADE PYELOGRAM (N/A)  Patient Location: PACU  Anesthesia Type:General  Level of Consciousness:  sedated, patient cooperative and responds to stimulation  Airway & Oxygen Therapy:Patient Spontanous Breathing and Patient connected to face mask oxgen  Post-op Assessment:  Report given to PACU RN and Post -op Vital signs reviewed and stable  Post vital signs:  Reviewed and stable  Last Vitals:  Vitals:   11/17/16 1030  BP: 108/70  Pulse: (!) 56  Resp: 16  Temp: 46.0 C    Complications: No apparent anesthesia complications

## 2016-11-17 NOTE — Anesthesia Procedure Notes (Signed)
Procedure Name: LMA Insertion Date/Time: 11/17/2016 12:51 PM Performed by: Oda Placke, Virgel Gess Pre-anesthesia Checklist: Patient identified, Emergency Drugs available, Suction available and Patient being monitored Patient Re-evaluated:Patient Re-evaluated prior to inductionOxygen Delivery Method: Circle system utilized Preoxygenation: Pre-oxygenation with 100% oxygen Intubation Type: IV induction LMA: LMA inserted LMA Size: 4.0 Number of attempts: 1 Placement Confirmation: positive ETCO2 and breath sounds checked- equal and bilateral Tube secured with: Tape Dental Injury: Teeth and Oropharynx as per pre-operative assessment

## 2016-11-17 NOTE — Op Note (Signed)
Preoperative diagnosis: Bladder tumor  Postoperative diagnosis: Bladder tumor  Procedures: 1.  Cystoscopy 2.  Bilateral retrograde pyelography with interpretation 3.  Transurethral resection of bladder tumor (3 cm)  Surgeon: Pryor Curia. M.D.  Anesthesia: General  Consultations: None  EBL: Minimal  Specimens: Bladder lesion from posterior left bladder wall Disposition of specimens: To pathology  Indication: Mark Blankenship is a 61 year old gentleman with a history of high risk, non-muscle invasive bladder cancer.  He recently was found to have a concerning bladder lesion on surveillance cystoscopy.  He presents today for transurethral resection.  We have reviewed potential complications, risks, and expected recovery process associated with the above procedures and gives informed consent to proceed.  Intraoperative findings: Bilateral retrograde pyelography was performed with 6 French ureteral catheters and Omnipaque contrast.  Bilateral renal collecting system and ureters appeared normal without filling defects or other abnormalities.  Description of procedure: The patient was taken to the operating room and a general anesthetic was administered.  He was given preoperative antibiotics, placed in the dorsal lithotomy position, and prepped and draped in the usual sterile fashion.  Next, a preoperative timeout was performed.  Cystourethroscopy was carefully performed with a 30 and 70 lens.  This revealed an erythematous and raised area along the posterior left side of the bladder as previously noted.  The entire area appeared to encompass approximately a 3 cm region.  No other bladder tumors or other concerning mucosal pathology was identified.  He did have a bladder diverticulum toward the right posterior bladder that was able to be accessed and was without mucosal abnormalities.  Attention then turned to the left ureteral orifice.  This was intubated with a 6 French ureteral catheter  and Omnipaque contrast was injected.  No abnormalities were noted.  Findings are as dictated above.  An identical procedure was then performed on the contralateral side again with no abnormalities noted.  Attention then returned to the bladder.  The cystoscope was removed and replaced with a 56 French resectoscope sheath placed under direct vision.  Using loop bipolar resection, the previously noted abnormal lesion was resected in its entirety.  The specimen was then removed for permanent pathologic analysis and hemostasis was achieved with bipolar cautery.  The bladder was emptied and reinspected.  Hemostasis appeared excellent.  The patient was able to be awakened and transferred to recovery in satisfactory condition.  No complications were noted during the procedure.

## 2016-11-17 NOTE — Anesthesia Preprocedure Evaluation (Addendum)
Anesthesia Evaluation  Patient identified by MRN, date of birth, ID band Patient awake    Reviewed: Allergy & Precautions, NPO status , Patient's Chart, lab work & pertinent test results, reviewed documented beta blocker date and time   History of Anesthesia Complications (+) Emergence Delirium and history of anesthetic complications  Airway Mallampati: II  TM Distance: >3 FB Neck ROM: Full    Dental  (+) Poor Dentition, Missing, Loose,    Pulmonary former smoker,    Pulmonary exam normal breath sounds clear to auscultation       Cardiovascular hypertension, Pt. on medications and Pt. on home beta blockers + CAD, + Past MI and + Peripheral Vascular Disease   Rhythm:Regular Rate:Bradycardia  MI 2005, 2017 LVEF 40-45% Ischemic Cardiomyopathy Dilated ascending aorta CAD- '05 MI > DES to circumflex. b. NSTEMI 08/2015 with total occlusion of the mid LAD along with 20% mid-distal Cx, 100% dCX, 40% D1, 50% pCx, 50% m-dRCA, 80% dRCA. 2D Echo 09/03/15: mild LVH, EF 40-45%, diffuse HK, akinesis of apical myocardium, grade 1 DD, aortic root dimention 57mm, ascending aorta mildly dilated, mildly dilated RV/RA.    Neuro/Psych  Headaches, negative psych ROS   GI/Hepatic Neg liver ROS, GERD  Medicated and Controlled,  Endo/Other  diabetes, Well Controlled, Type 2, Oral Hypoglycemic Agents  Renal/GU negative Renal ROS   Bladder Ca    Musculoskeletal  (+) Arthritis , Osteoarthritis,    Abdominal Normal abdominal exam  (+)   Peds  Hematology Thrombocytopenia Plavix - last dose 4/20   Anesthesia Other Findings   Reproductive/Obstetrics                              Chemistry      Component Value Date/Time   NA 136 11/08/2016 1019   K 4.3 11/08/2016 1019   CL 103 11/08/2016 1019   CO2 24 11/08/2016 1019   BUN 15 11/08/2016 1019   CREATININE 1.09 11/08/2016 1019   GLU 133 04/16/2016      Component Value  Date/Time   CALCIUM 9.5 11/08/2016 1019   ALKPHOS 64 09/06/2016 0845   AST 21 09/06/2016 0845   ALT 24 09/06/2016 0845   BILITOT 0.8 09/06/2016 0845     Lab Results  Component Value Date   WBC 7.1 11/08/2016   HGB 15.4 11/08/2016   HCT 43.5 11/08/2016   MCV 86.3 11/08/2016   PLT 113 (L) 11/08/2016   EKG: sinus bradycardia.  Anesthesia Physical Anesthesia Plan  ASA: III  Anesthesia Plan: General   Post-op Pain Management:    Induction: Intravenous  Airway Management Planned: Oral ETT  Additional Equipment:   Intra-op Plan:   Post-operative Plan: Extubation in OR  Informed Consent: I have reviewed the patients History and Physical, chart, labs and discussed the procedure including the risks, benefits and alternatives for the proposed anesthesia with the patient or authorized representative who has indicated his/her understanding and acceptance.   Dental advisory given  Plan Discussed with: CRNA, Anesthesiologist and Surgeon  Anesthesia Plan Comments:         Anesthesia Quick Evaluation

## 2016-11-17 NOTE — Interval H&P Note (Signed)
History and Physical Interval Note:  11/17/2016 12:20 PM  Mark Blankenship  has presented today for surgery, with the diagnosis of BLADDER CANCER  The various methods of treatment have been discussed with the patient and family. After consideration of risks, benefits and other options for treatment, the patient has consented to  Procedure(s): TRANSURETHRAL RESECTION OF BLADDER TUMOR (TURBT) (N/A) CYSTOSCOPY WITH RETROGRADE PYELOGRAM (N/A) as a surgical intervention .  The patient's history has been reviewed, patient examined, no change in status, stable for surgery.  I have reviewed the patient's chart and labs.  Questions were answered to the patient's satisfaction.     Reynolds Kittel,LES

## 2016-11-17 NOTE — Discharge Instructions (Addendum)
1. You may see some blood in the urine and may have some burning with urination for 48-72 hours. You also may notice that you have to urinate more frequently or urgently after your procedure which is normal.  2. You should call should you develop an inability urinate, fever > 101, persistent nausea and vomiting that prevents you from eating or drinking to stay hydrated.  3.    YOU MAY RESTART PLAVIX IN 5 DAYS IF YOUR URINE IS CLEAR.   General Anesthesia, Adult, Care After These instructions provide you with information about caring for yourself after your procedure. Your health care provider may also give you more specific instructions. Your treatment has been planned according to current medical practices, but problems sometimes occur. Call your health care provider if you have any problems or questions after your procedure. What can I expect after the procedure? After the procedure, it is common to have:  Vomiting.  A sore throat.  Mental slowness. It is common to feel:  Nauseous.  Cold or shivery.  Sleepy.  Tired.  Sore or achy, even in parts of your body where you did not have surgery. Follow these instructions at home: For at least 24 hours after the procedure:   Do not:  Participate in activities where you could fall or become injured.  Drive.  Use heavy machinery.  Drink alcohol.  Take sleeping pills or medicines that cause drowsiness.  Make important decisions or sign legal documents.  Take care of children on your own.  Rest. Eating and drinking   If you vomit, drink water, juice, or soup when you can drink without vomiting.  Drink enough fluid to keep your urine clear or pale yellow.  Make sure you have little or no nausea before eating solid foods.  Follow the diet recommended by your health care provider. General instructions   Have a responsible adult stay with you until you are awake and alert.  Return to your normal activities as told by your  health care provider. Ask your health care provider what activities are safe for you.  Take over-the-counter and prescription medicines only as told by your health care provider.  If you smoke, do not smoke without supervision.  Keep all follow-up visits as told by your health care provider. This is important. Contact a health care provider if:  You continue to have nausea or vomiting at home, and medicines are not helpful.  You cannot drink fluids or start eating again.  You cannot urinate after 8-12 hours.  You develop a skin rash.  You have fever.  You have increasing redness at the site of your procedure. Get help right away if:  You have difficulty breathing.  You have chest pain.  You have unexpected bleeding.  You feel that you are having a life-threatening or urgent problem. This information is not intended to replace advice given to you by your health care provider. Make sure you discuss any questions you have with your health care provider. Document Released: 10/17/2000 Document Revised: 12/14/2015 Document Reviewed: 06/25/2015 Elsevier Interactive Patient Education  2017 [employer].

## 2016-11-17 NOTE — Anesthesia Postprocedure Evaluation (Signed)
Anesthesia Post Note  Patient: ZACCHEUS EDMISTER  Procedure(s) Performed: Procedure(s) (LRB): TRANSURETHRAL RESECTION OF BLADDER TUMOR (TURBT) (N/A) CYSTOSCOPY WITH RETROGRADE PYELOGRAM (N/A)  Patient location during evaluation: PACU Anesthesia Type: General Level of consciousness: awake and alert and oriented Pain management: pain level controlled Vital Signs Assessment: post-procedure vital signs reviewed and stable Respiratory status: spontaneous breathing, nonlabored ventilation and respiratory function stable Cardiovascular status: blood pressure returned to baseline and stable Postop Assessment: no signs of nausea or vomiting Anesthetic complications: no       Last Vitals:  Vitals:   11/17/16 1500 11/17/16 1515  BP: 99/63 97/72  Pulse: (!) 46 (!) 48  Resp: 13 14  Temp:  (!) 36 C    Last Pain:  Vitals:   11/17/16 1030  TempSrc: Oral                 Maiana Hennigan A.

## 2016-11-21 ENCOUNTER — Encounter: Payer: Self-pay | Admitting: Family Medicine

## 2016-11-22 NOTE — Progress Notes (Signed)
HPI: FU coronary disease status post drug-eluting stent to the circumflex in August 2005. Patient admitted 2/17 with myocardial infarction. Cardiac catheterization February 2017 showed an occluded distal circumflex. The mid to distal LAD was occluded as well. Ejection fraction 40-50%. Patient was treated medically because of late presentation. Echocardiogram February 2017 showed ejection fraction 40-45% with akinesis of the apical myocardium. Mild right atrial and right ventricular enlargement. Since I last saw him, the patient denies any dyspnea on exertion, orthopnea, PND, pedal edema, palpitations, syncope or chest pain.   Current Outpatient Prescriptions  Medication Sig Dispense Refill  . acetaminophen (TYLENOL) 500 MG tablet Take 1,000 mg by mouth daily as needed for mild pain or headache.     Marland Kitchen aspirin EC 81 MG tablet Take 81 mg by mouth daily.    Marland Kitchen atenolol (TENORMIN) 50 MG tablet TAKE ONE TABLET BY MOUTH EVERY DAY WITH LUNCH 90 tablet 3  . atorvastatin (LIPITOR) 80 MG tablet Take 1 tablet (80 mg total) by mouth every evening. 90 tablet 2  . calcium carbonate (TUMS - DOSED IN MG ELEMENTAL CALCIUM) 500 MG chewable tablet Chew 1 tablet by mouth daily as needed for indigestion or heartburn.    . fluticasone (FLONASE) 50 MCG/ACT nasal spray Place 1 spray into both nostrils daily as needed for allergies. 16 g 3  . glipiZIDE (GLIPIZIDE XL) 10 MG 24 hr tablet TAKE ONE TABLET BY MOUTH EVERY DAY WITH LUNCH 90 tablet 1  . HYDROcodone-acetaminophen (NORCO/VICODIN) 5-325 MG tablet Take 1-2 tablets by mouth every 6 (six) hours as needed. 8 tablet 0  . lisinopril (PRINIVIL,ZESTRIL) 20 MG tablet TAKE ONE TABLET BY MOUTH EVERY DAY WITH LUNCH 90 tablet 1  . metFORMIN (GLUCOPHAGE) 1000 MG tablet TAKE ONE TABLET BY MOUTH TWICE DAILY WITH A MEAL 180 tablet 1  . nitroGLYCERIN (NITROSTAT) 0.4 MG SL tablet Place 1 tablet (0.4 mg total) under the tongue every 5 (five) minutes as needed for chest pain (up to  3 doses). 25 tablet 3  . oxybutynin (DITROPAN) 5 MG tablet Take 5 mg by mouth daily as needed for bladder spasms.     . phenazopyridine (PYRIDIUM) 100 MG tablet Take 1 tablet (100 mg total) by mouth 3 (three) times daily as needed for pain (for burning). 20 tablet 0  . phenazopyridine (PYRIDIUM) 200 MG tablet Take 1 tablet (200 mg total) by mouth 3 (three) times daily as needed (burning with urination). 21 tablet 0  . tamsulosin (FLOMAX) 0.4 MG CAPS capsule TAKE 1 CAPSULE BY MOUTH EVERY DAY AFTER SUPPER (Patient taking differently: Take 0.4mg s by mouth daily in the morning) 90 capsule 3   No current facility-administered medications for this visit.      Past Medical History:  Diagnosis Date  . Arthritis   . Ascending aorta dilatation (HCC)    a. Ascending aorta mildly dilated by echo 09/03/15.  . Bladder cancer [facility]) 2012   Mass removed, then BCG by urologist (Dr. Alinda Money).  Recurrence documented on cysto + bladder washings 10/14/15--getting more maintenance BCG.  Cysto 01/20/16 OK, then more BCG given.  Cysto 04/13/16 c/w just having finished BCG but cytology/FISH ok.  Cysto 07/27/2016 c/w small area of BCG related inflamm vs recurrent carcinoma in situ--washings for cytology done, BCG stopped due to severe sx's.  . Bladder cancer ([place])    11/02/16 cysto showed 1.5 cm raised erythematous lesion suspicious for recurrence--plan for cystoscopic/transurethral resection.  Marland Kitchen BPH (benign prostatic hyperplasia)    with hx  of acute prostatitis  . CAD (coronary artery disease)    a. '05 MI > DES to circumflex. b. NSTEMI 08/2015 with total occlusion of the mid LAD along with 20% mid-distal Cx, 100% dCX, 40% D1, 50% pCx, 50% m-dRCA, 80% dRCA.Given >12 hours out from symptom onset, medical therapy recommended.  . Complication of anesthesia    pt awakens aggitated and aggressive   . Diabetes mellitus type 2, controlled ([place])    HbA1c 02/2011 was 6.6%  . Elevated PSA 09/2011   with prostate nodule.  Prostate bx  ALL BENIGN 10/10/11 (Dr. Christella Hartigan followed by Urol.  Marland Kitchen GERD (gastroesophageal reflux disease)    occasional  . Headache(784.0)    cluster headaches a long time ago  . History of bronchitis   . History of vertigo   . HTN (hypertension)   . Hyperlipidemia   . Ischemic cardiomyopathy    a. 2D Echo 09/03/15: mild LVH, EF 40-45%, diffuse HK, akinesis of apical myocardium, grade 1 DD, aortic root dimention 66mm, ascending aorta mildly dilated, mildly dilated RV/RA.   Marland Kitchen Myocardial infarction [facility]) 2005; 08/2015   followed by Dr. Stanford Breed  . Thrombocytopenia ([place])    Stable 2012-2017  . Tobacco abuse   . Urinary tract infection   . Wolf-Parkinson-White syndrome     Past Surgical History:  Procedure Laterality Date  . BLADDER TUMOR EXCISION  11/08/10; 05/2015   urothelial carcinoma (Dr. Alinda Money).  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  Marland Kitchen CARDIAC CATHETERIZATION  2005   with stent placement  . CARDIAC CATHETERIZATION N/A 09/01/2015   Procedure: Left Heart Cath and Coronary Angiography;  Surgeon: Belva Crome, MD;  Location: [place] [facility];  Service: Cardiovascular;  Laterality: N/A;  . COLONOSCOPY  2010   Dr. Isa Rankin, repeat 2020.  Patient wants to go to different GI when time for repeat.  . CYSTOSCOPY W/ RETROGRADES Bilateral 06/22/2015   Procedure: CYSTOSCOPY WITH RETROGRADE PYELOGRAM;  Surgeon: Raynelle Bring, MD;  Location: [facility];  Service: Urology;  Laterality: Bilateral;  . CYSTOSCOPY W/ RETROGRADES N/A 11/17/2016   Procedure: CYSTOSCOPY WITH RETROGRADE PYELOGRAM;  Surgeon: Raynelle Bring, MD;  Location: [facility];  Service: Urology;  Laterality: N/A;  . CYSTOSCOPY WITH RETROGRADE PYELOGRAM, URETEROSCOPY AND STENT PLACEMENT N/A 06/06/2013   Procedure: CYSTOSCOPY WITH RETROGRADE PYELOGRAM;  Surgeon: Dutch Gray, MD;  Location: [facility];  Service: Urology;  Laterality: N/A;  . lexiscan  2012   Normal/negative  . [place]no trouble since last surgery  . myocardial perfusion study  2009   Neg ischemia, EF 52%  . TRANSURETHRAL RESECTION OF BLADDER TUMOR N/A 06/06/2013   Procedure: TRANSURETHRAL RESECTION OF BLADDER TUMOR (TURBT);  Surgeon: Dutch Gray, MD;  Location: [facility];  Service: Urology;  Laterality: N/A;.  PATH: squamous metaplasia, fibrosis with chronic inflammation, no malignancy.  . TRANSURETHRAL RESECTION OF BLADDER TUMOR N/A 06/22/2015   Carcinoma in situ: no invasive malignancy.  Procedure: TRANSURETHRAL RESECTION OF BLADDER TUMOR (TURBT);  Surgeon: Raynelle Bring, MD;  Location: [facility];  Service: Urology;  Laterality: N/A;  **BLADDER BIOPSY*RESECTION**  . TRANSURETHRAL RESECTION OF BLADDER TUMOR N/A 11/17/2016   Path: BENIGN UROTHELIUM WITH INFLAMMATION--no malignancy. Procedure: TRANSURETHRAL RESECTION OF BLADDER TUMOR (TURBT);  Surgeon: Raynelle Bring, MD;  Location: [facility];  Service: Urology;  Laterality: N/A;  . VASECTOMY      Social History   Social History  . Marital status:  Married    Spouse name: N/A  . Number of children: N/A  . Years of education: N/A   Occupational History  . Not on file.   Social History Main Topics  . Smoking status: Former Smoker    Packs/day: 0.25    Years: 40.00    Types: Cigarettes    Quit date: 09/05/2015  . Smokeless tobacco: Never Used  . Alcohol use No  . Drug use: No  . Sexual activity: Not on file   Other Topics Concern  . Not on file   Social History Narrative   Married, 2 children, 4 grandchildren--[place].   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   Tob: 30 pack-yr hx, quit 08/2011.   No alcohol or drug use.   Active on farm but no formal exercise regimen.    Family History  Problem Relation Age of Onset  . Hypertension Mother   . Cancer Father     colon and prostate, mets to lungs.  .  Hypertension Father   . Heart disease Brother     d. 26 MI  . Multiple sclerosis Brother   . Diabetes Brother   . Heart disease Paternal Uncle     ROS: no fevers or chills, productive cough, hemoptysis, dysphasia, odynophagia, melena, hematochezia, dysuria, hematuria, rash, seizure activity, orthopnea, PND, pedal edema, claudication. Remaining systems are negative.  Physical Exam: Well-developed well-nourished in no acute distress.  Skin is warm and dry.  HEENT is normal.  Neck is supple. No bruits Chest is clear to auscultation with normal expansion. No wheeze Cardiovascular exam is regular rate and rhythm.  Abdominal exam nontender or distended. No masses palpated. Extremities show no edema. neuro grossly intact  ECG- sinus bradycardia at a rate of 55. No ST changes. personally reviewed  A/P  1 coronary artery disease-patient is doing well from a symptomatic standpoint. Plan to continue medical therapy. Continue aspirin and statin. It has been over one year since his previous MI. Discontinue Plavix.   2 ischemic cardiomyopathy-continue ACE inhibitor and beta blocker.  3 hypertension-blood pressure is controlled. Continue present medications. Recent laboratories April 2018 showed potassium 4.3, BUN 15 and creatinine 1.09.   4 hyperlipidemia-continue statin. Recent laboratories February 2018 showed LDL of 44 and liver functions normal.   5 diabetes mellitus-recent laboratories show elevated hemoglobin A1c. Follow-up with primary care for further management and adjustment of medications.  Kirk Ruths, MD

## 2016-11-29 ENCOUNTER — Encounter: Payer: Self-pay | Admitting: Cardiology

## 2016-11-29 ENCOUNTER — Ambulatory Visit (INDEPENDENT_AMBULATORY_CARE_PROVIDER_SITE_OTHER): Payer: BLUE CROSS/BLUE SHIELD | Admitting: Cardiology

## 2016-11-29 VITALS — BP 110/60 | HR 55 | Ht 66.0 in | Wt 182.6 lb

## 2016-11-29 DIAGNOSIS — Z8551 Personal history of malignant neoplasm of bladder: Secondary | ICD-10-CM | POA: Diagnosis not present

## 2016-11-29 DIAGNOSIS — E7800 Pure hypercholesterolemia, unspecified: Secondary | ICD-10-CM | POA: Diagnosis not present

## 2016-11-29 DIAGNOSIS — N401 Enlarged prostate with lower urinary tract symptoms: Secondary | ICD-10-CM | POA: Diagnosis not present

## 2016-11-29 DIAGNOSIS — I2510 Atherosclerotic heart disease of native coronary artery without angina pectoris: Secondary | ICD-10-CM | POA: Diagnosis not present

## 2016-11-29 DIAGNOSIS — R3914 Feeling of incomplete bladder emptying: Secondary | ICD-10-CM | POA: Diagnosis not present

## 2016-11-29 DIAGNOSIS — I10 Essential (primary) hypertension: Secondary | ICD-10-CM

## 2016-11-29 DIAGNOSIS — I255 Ischemic cardiomyopathy: Secondary | ICD-10-CM | POA: Diagnosis not present

## 2016-11-29 NOTE — Patient Instructions (Signed)
Medication Instructions:   STOP PLAVIX  Follow-Up:  Your physician wants you to follow-up in: ONE YEAR WITH DR CRENSHAW You will receive a reminder letter in the mail two months in advance. If you don't receive a letter, please call our office to schedule the follow-up appointment.   If you need a refill on your cardiac medications before your next appointment, please call your pharmacy.    

## 2016-12-04 ENCOUNTER — Encounter: Payer: Self-pay | Admitting: Family Medicine

## 2016-12-05 ENCOUNTER — Encounter: Payer: Self-pay | Admitting: Family Medicine

## 2016-12-23 ENCOUNTER — Encounter: Payer: Self-pay | Admitting: Family Medicine

## 2016-12-23 ENCOUNTER — Ambulatory Visit (INDEPENDENT_AMBULATORY_CARE_PROVIDER_SITE_OTHER): Payer: BLUE CROSS/BLUE SHIELD | Admitting: Family Medicine

## 2016-12-23 VITALS — BP 118/73 | HR 72 | Temp 98.0°F | Resp 20 | Wt 177.0 lb

## 2016-12-23 DIAGNOSIS — M79672 Pain in left foot: Secondary | ICD-10-CM

## 2016-12-23 MED ORDER — NAPROXEN 500 MG PO TABS: 500.0000 mg | ORAL_TABLET | Freq: Two times a day (BID) | ORAL | 0 refills | Status: DC

## 2016-12-23 NOTE — Patient Instructions (Addendum)
I believe this is from overuse and/or shoes are too tight.   Get a wider shoe.  Use naproxen every 12 hours with food only for 1-2 days... Do not want long term with aspirin use.

## 2016-12-23 NOTE — Progress Notes (Signed)
Mark Blankenship , 09-20-1955, 61 y.o., male MRN: [id number] Patient Care Team    Relationship Specialty Notifications Start End  McGowen, Adrian Blackwater, MD PCP - General Family Medicine  08/12/11   Raynelle Bring, MD Consulting Physician Urology  08/14/11    Comment: bladder cancer surgeon  Lelon Perla, MD Consulting Physician Cardiology  08/15/11     Chief Complaint  Patient presents with  . Toe Pain    left great toe     Subjective: Pt presents for an OV with complaints of Left great toe pain of one-day duration.  Associated symptoms include burning sensation. There is nothing that seems to make the pain worse or better. Patient states he woke up around 4 AM this morning with his left great toe burning. He is uncertain if the pain woke him, or if he just woke up and realized his toe hurt. He states that this occurred approximately one month ago, and self resolved within 1-2 hours. Patient denies any color changes in his foot-toe, redness or pallor. He denies any swelling, history of gout, or injury. He has not taken anything for the pain. He reports he has neuropathy in that foot from his lower lumbar area, that resolved with surgery. He states his left great toe has been permanently numb since that time.  He points to area within the first metatarsal on his foot as the location of pain, not so much his toe. Area of reported discomfort is along extensor hallucis longus tendon sheath. Patient states the area is not hurting currently, but he did have a pain just a few minutes ago. Patient was on his feet quite a bit yesterday, in a pair shoes that he endorses is a little too narrow for him. He also has a history of diabetes, that has been fairly well controlled last A1c 6.8.   No flowsheet data found.  No Known Allergies Social History  Substance Use Topics  . Smoking status: Former Smoker    Packs/day: 0.25    Years: 40.00    Types: Cigarettes    Quit date: 09/05/2015  . Smokeless  tobacco: Never Used  . Alcohol use No   Past Medical History:  Diagnosis Date  . Arthritis   . Ascending aorta dilatation (HCC)    a. Ascending aorta mildly dilated by echo 09/03/15.  . Bladder cancer [facility]) 2012   Mass removed, then BCG by urologist (Dr. Alinda Money).  Recurrence documented on cysto + bladder washings 10/14/15--getting more maintenance BCG.  Cysto 01/20/16 OK, then more BCG given.  Cysto 04/13/16 c/w just having finished BCG but cytology/FISH ok.  Cysto 07/27/2016 c/w small area of BCG related inflamm vs recurrent carcinoma in situ--washings for cytology done, BCG stopped due to severe sx's.  . Bladder cancer ([place])    11/02/16 cysto showed 1.5 cm raised erythematous lesion suspicious for recurrence--cystoscopic resection was done and path was benign 10/2016.  Surveillance cystoscopy 6 mo is urol plan  . BPH (benign prostatic hyperplasia)    with hx of acute prostatitis  . CAD (coronary artery disease)    a. '05 MI > DES to circumflex. b. NSTEMI 08/2015 with total occlusion of the mid LAD along with 20% mid-distal Cx, 100% dCX, 40% D1, 50% pCx, 50% m-dRCA, 80% dRCA.Given >12 hours out from symptom onset, medical therapy recommended.  . Complication of anesthesia    pt awakens aggitated and aggressive   . Diabetes mellitus type 2, controlled ([place])    HbA1c 02/2011  was 6.6%  . Elevated PSA 09/2011   with prostate nodule.  Prostate bx ALL BENIGN 10/10/11 (Dr. Christella Hartigan followed by [facility], next one planned for 05/2017.  Marland Kitchen GERD (gastroesophageal reflux disease)    occasional  . Headache(784.0)    cluster headaches a long time ago  . History of bronchitis   . History of vertigo   . HTN (hypertension)   . Hyperlipidemia   . Ischemic cardiomyopathy    a. 2D Echo 09/03/15: mild LVH, EF 40-45%, diffuse HK, akinesis of apical myocardium, grade 1 DD, aortic root dimention 42mm, ascending aorta mildly dilated, mildly dilated RV/RA.   Marland Kitchen Myocardial infarction [facility]) 2005; 08/2015   followed by Dr.  Stanford Breed  . Thrombocytopenia ([place])    Stable 2012-2017  . Tobacco abuse   . Urinary tract infection   . Wolf-Parkinson-White syndrome    Past Surgical History:  Procedure Laterality Date  . BLADDER TUMOR EXCISION  11/08/10; 05/2015   urothelial carcinoma (Dr. Alinda Money).  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  Marland Kitchen CARDIAC CATHETERIZATION  2005   with stent placement  . CARDIAC CATHETERIZATION N/A 09/01/2015   Procedure: Left Heart Cath and Coronary Angiography;  Surgeon: Belva Crome, MD;  Location: [place] CV LAB;  Service: Cardiovascular;  Laterality: N/A;  . COLONOSCOPY  2010   Dr. Isa Rankin, repeat 2020.  Patient wants to go to different GI when time for repeat.  . CYSTOSCOPY W/ RETROGRADES Bilateral 06/22/2015   Procedure: CYSTOSCOPY WITH RETROGRADE PYELOGRAM;  Surgeon: Raynelle Bring, MD;  [facility];  Service: Urology;  Laterality: Bilateral;  . CYSTOSCOPY W/ RETROGRADES N/A 11/17/2016   Procedure: CYSTOSCOPY WITH RETROGRADE PYELOGRAM;  Surgeon: Raynelle Bring, MD;  [facility];  Service: Urology;  Laterality: N/A;  . CYSTOSCOPY WITH RETROGRADE PYELOGRAM, URETEROSCOPY AND STENT PLACEMENT N/A 06/06/2013   Procedure: CYSTOSCOPY WITH RETROGRADE PYELOGRAM;  Surgeon: Dutch Gray, MD;  [facility];  Service: Urology;  Laterality: N/A;  . lexiscan  2012   Normal/negative  . [facility] in [place] ([place])-no trouble since last surgery  . myocardial perfusion study  2009   Neg ischemia, EF 52%  . TRANSURETHRAL RESECTION OF BLADDER TUMOR N/A 06/06/2013   Procedure: TRANSURETHRAL RESECTION OF BLADDER TUMOR (TURBT);  Surgeon: Dutch Gray, MD;  [facility];  Service: Urology;  Laterality: N/A;.  PATH: squamous metaplasia, fibrosis with chronic inflammation, no malignancy.  . TRANSURETHRAL RESECTION OF BLADDER TUMOR N/A 06/22/2015   Carcinoma in situ: no invasive malignancy.  Procedure:  TRANSURETHRAL RESECTION OF BLADDER TUMOR (TURBT);  Surgeon: Raynelle Bring, MD;  [facility];  Service: Urology;  Laterality: N/A;  **BLADDER BIOPSY*RESECTION**  . TRANSURETHRAL RESECTION OF BLADDER TUMOR N/A 11/17/2016   Path: BENIGN UROTHELIUM WITH INFLAMMATION--no malignancy. Procedure: TRANSURETHRAL RESECTION OF BLADDER TUMOR (TURBT);  Surgeon: Raynelle Bring, MD;  [facility];  Service: Urology;  Laterality: N/A;  . VASECTOMY     Family History  Problem Relation Age of Onset  . Hypertension Mother   . Cancer Father        colon and prostate, mets to lungs.  . Hypertension Father   . Heart disease Brother        d. 68 MI  . Multiple sclerosis Brother   . Diabetes Brother   . Heart disease Paternal Uncle    Allergies as of 12/23/2016   No Known Allergies     Medication List  Accurate as of 12/23/16  2:43 PM. Always use your most recent med list.          acetaminophen 500 MG tablet Commonly known as:  TYLENOL Take 1,000 mg by mouth daily as needed for mild pain or headache.   aspirin EC 81 MG tablet Take 81 mg by mouth daily.   atenolol 50 MG tablet Commonly known as:  TENORMIN TAKE ONE TABLET BY MOUTH EVERY DAY WITH LUNCH   atorvastatin 80 MG tablet Commonly known as:  LIPITOR Take 1 tablet (80 mg total) by mouth every evening.   calcium carbonate 500 MG chewable tablet Commonly known as:  TUMS - dosed in mg elemental calcium Chew 1 tablet by mouth daily as needed for indigestion or heartburn.   fluticasone 50 MCG/ACT nasal spray Commonly known as:  FLONASE Place 1 spray into both nostrils daily as needed for allergies.   glipiZIDE 10 MG 24 hr tablet Commonly known as:  GLIPIZIDE XL TAKE ONE TABLET BY MOUTH EVERY DAY WITH LUNCH   HYDROcodone-acetaminophen 5-325 MG tablet Commonly known as:  NORCO/VICODIN Take 1-2 tablets by mouth every 6 (six) hours as needed.   lisinopril 20 MG tablet Commonly known as:  PRINIVIL,ZESTRIL TAKE ONE TABLET BY  MOUTH EVERY DAY WITH LUNCH   metFORMIN 1000 MG tablet Commonly known as:  GLUCOPHAGE TAKE ONE TABLET BY MOUTH TWICE DAILY WITH A MEAL   nitroGLYCERIN 0.4 MG SL tablet Commonly known as:  NITROSTAT Place 1 tablet (0.4 mg total) under the tongue every 5 (five) minutes as needed for chest pain (up to 3 doses).   oxybutynin 5 MG tablet Commonly known as:  DITROPAN Take 5 mg by mouth daily as needed for bladder spasms.   phenazopyridine 100 MG tablet Commonly known as:  PYRIDIUM Take 1 tablet (100 mg total) by mouth 3 (three) times daily as needed for pain (for burning).   tamsulosin 0.4 MG Caps capsule Commonly known as:  FLOMAX TAKE 1 CAPSULE BY MOUTH EVERY DAY AFTER SUPPER       All past medical history, surgical history, allergies, family history, immunizations andmedications were updated in the EMR today and reviewed under the history and medication portions of their EMR.     ROS: Negative, with the exception of above mentioned in HPI   Objective:  BP 118/73 (BP Location: Left Arm, Patient Position: Sitting, Cuff Size: Normal)   Pulse 72   Temp 98 F (36.7 C)   Resp 20   Wt 177 lb (80.3 kg)   SpO2 96%   BMI 28.57 kg/m  Body mass index is 28.57 kg/m. Gen: Afebrile. No acute distress. Nontoxic in appearance, well developed, well nourished.  MSK: No erythema, no soft tissue swelling, no tenderness to palpation, no pain with range of motion. Good capillary refill. Sensation unchanged Neuro:  Normal gait. PERLA. EOMi. Alert. Oriented x3  No exam data present No results found. No results found for this or any previous visit (from the past 24 hour(s)).  Assessment/Plan: Mark Blankenship is a 61 y.o. male present for OV for  Left foot pain - Uncertain etiology of his discomfort. I suspect it is an overuse injury and/or wearing shoes too narrow for his foot. Encouraged him to get a better supportive, wider tennis shoe. He seems to be more concern over potential pain, then  current pain, since he has a lot of work to do over the next week and in which he will be on his feet, lifting and  carrying heavy objects. - Do not feel x-ray is needed at this time, patient was not tender in the least on exam. Exam was normal. - Discussed the use of anti-inflammatory very short-term scheduled to help with potential strain/overuse injury. It does not sound like he is going be able to slow down over the next couple of days in order to allow healing. - Follow-up with PCP next week if not improving, or be seen sooner if worsening.   Reviewed expectations re: course of current medical issues.  Discussed self-management of symptoms.  Outlined signs and symptoms indicating need for more acute intervention.  Patient verbalized understanding and all questions were answered.  Patient received an After-Visit Summary.     Note is dictated utilizing voice recognition software. Although note has been proof read prior to signing, occasional typographical errors still can be missed. If any questions arise, please do not hesitate to call for verification.   electronically signed by:  Howard Pouch, DO  [place]

## 2016-12-28 ENCOUNTER — Other Ambulatory Visit: Payer: Self-pay | Admitting: Cardiology

## 2017-01-03 ENCOUNTER — Encounter: Payer: Self-pay | Admitting: Family Medicine

## 2017-01-03 ENCOUNTER — Ambulatory Visit (INDEPENDENT_AMBULATORY_CARE_PROVIDER_SITE_OTHER): Payer: BLUE CROSS/BLUE SHIELD | Admitting: Family Medicine

## 2017-01-03 VITALS — BP 96/60 | HR 63 | Temp 98.3°F | Resp 16 | Ht 66.0 in | Wt 177.5 lb

## 2017-01-03 DIAGNOSIS — I10 Essential (primary) hypertension: Secondary | ICD-10-CM

## 2017-01-03 DIAGNOSIS — I2510 Atherosclerotic heart disease of native coronary artery without angina pectoris: Secondary | ICD-10-CM | POA: Diagnosis not present

## 2017-01-03 DIAGNOSIS — E118 Type 2 diabetes mellitus with unspecified complications: Secondary | ICD-10-CM | POA: Diagnosis not present

## 2017-01-03 DIAGNOSIS — E7800 Pure hypercholesterolemia, unspecified: Secondary | ICD-10-CM | POA: Diagnosis not present

## 2017-01-03 NOTE — Progress Notes (Signed)
OFFICE VISIT  01/03/2017   CC:  Chief Complaint  Patient presents with  . Follow-up    RCI, pt is fasting.    HPI:    Patient is a 61 y.o. Caucasian male who presents for 4 mo f/u DM 2, HTN, HLD. At most recent cardiology f/u he had his plavix d/c'd b/c it had been 1 yr since last intervention/stent. No other changes made.  HTN: usual home measurement 110/60-70, HR 55-60.  No dizziness or lightheadedness when standing up. Has been eating and drinking well, no recent dehydration.  DM 2: avg around 100 morning and afternoons.  No hypoglycemia.  HLD: taking atorv 80mg  qd, no side effects.  ROS: some nasal congestion and cough started yesterday.  No fevers.  No ST or HA. No CP, no SOB, no diaphoresis, no arm or jaw pain, no LE swelling.  Past Medical History:  Diagnosis Date  . Arthritis   . Ascending aorta dilatation (HCC)    a. Ascending aorta mildly dilated by echo 09/03/15.  . Bladder cancer [facility]) 2012   Mass removed, then BCG by urologist (Dr. Alinda Money).  Recurrence documented on cysto + bladder washings 10/14/15--getting more maintenance BCG.  Cysto 01/20/16 OK, then more BCG given.  Cysto 04/13/16 c/w just having finished BCG but cytology/FISH ok.  Cysto 07/27/2016 c/w small area of BCG related inflamm vs recurrent carcinoma in situ--washings for cytology done, BCG stopped due to severe sx's.  . Bladder cancer ([place])    11/02/16 cysto showed 1.5 cm raised erythematous lesion suspicious for recurrence--cystoscopic resection was done and path was benign 10/2016.  Surveillance cystoscopy 6 mo is urol plan  . BPH (benign prostatic hyperplasia)    with hx of acute prostatitis  . CAD (coronary artery disease)    a. '05 MI > DES to circumflex. b. NSTEMI 08/2015 with total occlusion of the mid LAD along with 20% mid-distal Cx, 100% dCX, 40% D1, 50% pCx, 50% m-dRCA, 80% dRCA.Given >12 hours out from symptom onset, medical therapy recommended.  . Complication of anesthesia    pt awakens aggitated  and aggressive   . Diabetes mellitus type 2, controlled ([place])    HbA1c 02/2011 was 6.6%  . Elevated PSA 09/2011   with prostate nodule.  Prostate bx ALL BENIGN 10/10/11 (Dr. Christella Hartigan followed by [facility] Freestone, next one planned for 05/2017.  Marland Kitchen GERD (gastroesophageal reflux disease)    occasional  . Headache(784.0)    cluster headaches a long time ago  . History of bronchitis   . History of vertigo   . HTN (hypertension)   . Hyperlipidemia   . Ischemic cardiomyopathy    a. 2D Echo 09/03/15: mild LVH, EF 40-45%, diffuse HK, akinesis of apical myocardium, grade 1 DD, aortic root dimention 27mm, ascending aorta mildly dilated, mildly dilated RV/RA.   Marland Kitchen Myocardial infarction [facility]) 2005; 08/2015   followed by Dr. Stanford Breed  . Thrombocytopenia ([place])    Stable 2012-2017  . Tobacco abuse   . Urinary tract infection   . Wolf-Parkinson-White syndrome     Past Surgical History:  Procedure Laterality Date  . BLADDER TUMOR EXCISION  11/08/10; 05/2015   urothelial carcinoma (Dr. Alinda Money).  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  Marland Kitchen CARDIAC CATHETERIZATION  2005   with stent placement  . CARDIAC CATHETERIZATION N/A 09/01/2015   Procedure: Left Heart Cath and Coronary Angiography;  Surgeon: Belva Crome, MD;  Location: [place] CV LAB;  Service: Cardiovascular;  Laterality: N/A;  .  COLONOSCOPY  2010   Dr. Isa Rankin, repeat 2020.  Patient wants to go to different GI when time for repeat.  . CYSTOSCOPY W/ RETROGRADES Bilateral 06/22/2015   Procedure: CYSTOSCOPY WITH RETROGRADE PYELOGRAM;  Surgeon: Raynelle Bring, MD;  [facility];  Service: Urology;  Laterality: Bilateral;  . CYSTOSCOPY W/ RETROGRADES N/A 11/17/2016   Procedure: CYSTOSCOPY WITH RETROGRADE PYELOGRAM;  Surgeon: Raynelle Bring, MD;  [facility];  Service: Urology;  Laterality: N/A;  . CYSTOSCOPY WITH RETROGRADE PYELOGRAM, URETEROSCOPY AND STENT PLACEMENT N/A 06/06/2013   Procedure: CYSTOSCOPY  WITH RETROGRADE PYELOGRAM;  Surgeon: Dutch Gray, MD;  [facility];  Service: Urology;  Laterality: N/A;  . lexiscan  2012   Normal/negative  . [place] ([place])-no trouble since last surgery  . myocardial perfusion study  2009   Neg ischemia, EF 52%  . TRANSURETHRAL RESECTION OF BLADDER TUMOR N/A 06/06/2013   Procedure: TRANSURETHRAL RESECTION OF BLADDER TUMOR (TURBT);  Surgeon: Dutch Gray, MD;  [facility];  Service: Urology;  Laterality: N/A;.  PATH: squamous metaplasia, fibrosis with chronic inflammation, no malignancy.  . TRANSURETHRAL RESECTION OF BLADDER TUMOR N/A 06/22/2015   Carcinoma in situ: no invasive malignancy.  Procedure: TRANSURETHRAL RESECTION OF BLADDER TUMOR (TURBT);  Surgeon: Raynelle Bring, MD;  [facility];  Service: Urology;  Laterality: N/A;  **BLADDER BIOPSY*RESECTION**  . TRANSURETHRAL RESECTION OF BLADDER TUMOR N/A 11/17/2016   Path: BENIGN UROTHELIUM WITH INFLAMMATION--no malignancy. Procedure: TRANSURETHRAL RESECTION OF BLADDER TUMOR (TURBT);  Surgeon: Raynelle Bring, MD;  [facility];  Service: Urology;  Laterality: N/A;  . VASECTOMY      Outpatient Medications Prior to Visit  Medication Sig Dispense Refill  . acetaminophen (TYLENOL) 500 MG tablet Take 1,000 mg by mouth daily as needed for mild pain or headache.     Marland Kitchen aspirin EC 81 MG tablet Take 81 mg by mouth daily.    Marland Kitchen atenolol (TENORMIN) 50 MG tablet TAKE ONE TABLET BY MOUTH EVERY DAY WITH LUNCH 90 tablet 3  . atorvastatin (LIPITOR) 80 MG tablet TAKE ONE TABLET BY MOUTH EVERY EVENING 90 tablet 1  . calcium carbonate (TUMS - DOSED IN MG ELEMENTAL CALCIUM) 500 MG chewable tablet Chew 1 tablet by mouth daily as needed for indigestion or heartburn.    . fluticasone (FLONASE) 50 MCG/ACT nasal spray Place 1 spray into both nostrils daily as needed for allergies. 16 g 3  . glipiZIDE (GLIPIZIDE XL) 10 MG 24 hr tablet TAKE ONE TABLET BY MOUTH EVERY DAY  WITH LUNCH 90 tablet 1  . lisinopril (PRINIVIL,ZESTRIL) 20 MG tablet TAKE ONE TABLET BY MOUTH EVERY DAY WITH LUNCH 90 tablet 1  . metFORMIN (GLUCOPHAGE) 1000 MG tablet TAKE ONE TABLET BY MOUTH TWICE DAILY WITH A MEAL 180 tablet 1  . nitroGLYCERIN (NITROSTAT) 0.4 MG SL tablet Place 1 tablet (0.4 mg total) under the tongue every 5 (five) minutes as needed for chest pain (up to 3 doses). 25 tablet 3  . oxybutynin (DITROPAN) 5 MG tablet Take 5 mg by mouth daily as needed for bladder spasms.     . phenazopyridine (PYRIDIUM) 100 MG tablet Take 1 tablet (100 mg total) by mouth 3 (three) times daily as needed for pain (for burning). 20 tablet 0  . tamsulosin (FLOMAX) 0.4 MG CAPS capsule TAKE 1 CAPSULE BY MOUTH EVERY DAY AFTER SUPPER (Patient taking differently: Take 0.4mg s by mouth daily in the morning) 90 capsule 3  . HYDROcodone-acetaminophen (  NORCO/VICODIN) 5-325 MG tablet Take 1-2 tablets by mouth every 6 (six) hours as needed. (Patient not taking: Reported on 01/03/2017) 8 tablet 0  . naproxen (NAPROSYN) 500 MG tablet Take 1 tablet (500 mg total) by mouth 2 (two) times daily with a meal. (Patient not taking: Reported on 01/03/2017) 15 tablet 0   No facility-administered medications prior to visit.     No Known Allergies  ROS As per HPI  PE: Blood pressure 96/60, pulse 63, temperature 98.3 F (36.8 C), temperature source Oral, resp. rate 16, height 5\' 6"  (1.676 m), weight 177 lb 8 oz (80.5 kg), SpO2 96 %. Gen: Alert, well appearing.  Patient is oriented to person, place, time, and situation. AFFECT: pleasant, lucid thought and speech. EHU:DJSH: no injection, icteris, swelling, or exudate.  EOMI, PERRLA. Mouth: lips without lesion/swelling.  Oral mucosa pink and moist. Oropharynx without erythema, exudate, or swelling.  CV: RRR, no m/r/g.   LUNGS: CTA bilat, nonlabored resps, good aeration in all lung fields. EXT: no clubbing, cyanosis, or edema.   LABS:  Lab Results  Component Value Date    TSH 0.96 08/26/2015   Lab Results  Component Value Date   WBC 7.1 11/08/2016   HGB 15.4 11/08/2016   HCT 43.5 11/08/2016   MCV 86.3 11/08/2016   PLT 113 (L) 11/08/2016   Lab Results  Component Value Date   CREATININE 1.09 11/08/2016   BUN 15 11/08/2016   NA 136 11/08/2016   K 4.3 11/08/2016   CL 103 11/08/2016   CO2 24 11/08/2016   Lab Results  Component Value Date   ALT 24 09/06/2016   AST 21 09/06/2016   ALKPHOS 64 09/06/2016   BILITOT 0.8 09/06/2016   Lab Results  Component Value Date   CHOL 93 09/06/2016   Lab Results  Component Value Date   HDL 30.50 (L) 09/06/2016   Lab Results  Component Value Date   LDLCALC 44 09/06/2016   Lab Results  Component Value Date   TRIG 94.0 09/06/2016   Lab Results  Component Value Date   CHOLHDL 3 09/06/2016   Lab Results  Component Value Date   PSA 2.25 01/13/2016   Lab Results  Component Value Date   HGBA1C 6.8 (H) 11/08/2016    IMPRESSION AND PLAN:  1) DM 2, good control. Had A1c a little less than 2 mo ago b/c of having a procedure in hospital, so he is not due for this again until after 02/07/17. He'll make lab visit to get this in 6 wks.  2) HTN: bp a little low today but he is asymptomatic.  Reviewed bp's in office in the past and discussed his home bp avg's---usually low normal range.  We discussed possible down-titration of bp med today but decided to keep everything the same for now.  3) HLD: tolerating statin, last LDL 44 about 4 months ago.  AST/ALT normal at that time as well. Plan repeat FLP/transaminases 08/2017.  4) CAD: doing well, now 1 yr s/p his most recent stent placement---is on ASA alone now, with beta blocker, statin, and ACE-I.  FOLLOW UP: Return in about 5 months (around 06/05/2017) for routine chronic illness f/u: pt also needs fasting lab visit in 6 weeks.  Signed:  Crissie Sickles, MD           01/03/2017

## 2017-02-14 ENCOUNTER — Other Ambulatory Visit (INDEPENDENT_AMBULATORY_CARE_PROVIDER_SITE_OTHER): Payer: BLUE CROSS/BLUE SHIELD

## 2017-02-14 DIAGNOSIS — I10 Essential (primary) hypertension: Secondary | ICD-10-CM | POA: Diagnosis not present

## 2017-02-14 DIAGNOSIS — E118 Type 2 diabetes mellitus with unspecified complications: Secondary | ICD-10-CM

## 2017-02-14 LAB — BASIC METABOLIC PANEL
BUN: 12 mg/dL (ref 6–23)
CO2: 31 meq/L (ref 19–32)
Calcium: 9.5 mg/dL (ref 8.4–10.5)
Chloride: 106 mEq/L (ref 96–112)
Creatinine, Ser: 1.16 mg/dL (ref 0.40–1.50)
GFR: 67.87 mL/min (ref >60.00)
Glucose, Bld: 95 mg/dL (ref 70–99)
Potassium: 4.3 mEq/L (ref 3.5–5.1)
Sodium: 142 mEq/L (ref 135–145)

## 2017-02-14 LAB — HEMOGLOBIN A1C: Hgb A1c MFr Bld: 6.5 % (ref 4.6–6.5)

## 2017-02-15 ENCOUNTER — Encounter: Payer: Self-pay | Admitting: *Deleted

## 2017-03-14 ENCOUNTER — Other Ambulatory Visit: Payer: Self-pay | Admitting: Family Medicine

## 2017-04-04 ENCOUNTER — Other Ambulatory Visit: Payer: Self-pay | Admitting: Family Medicine

## 2017-04-06 ENCOUNTER — Encounter: Payer: Self-pay | Admitting: *Deleted

## 2017-04-06 ENCOUNTER — Ambulatory Visit (HOSPITAL_BASED_OUTPATIENT_CLINIC_OR_DEPARTMENT_OTHER)
Admission: RE | Admit: 2017-04-06 | Discharge: 2017-04-06 | Disposition: A | Discharge status: Home / Self Care | Payer: BLUE CROSS/BLUE SHIELD | Source: Ambulatory Visit | Attending: Family Medicine | Admitting: Family Medicine

## 2017-04-06 ENCOUNTER — Ambulatory Visit (INDEPENDENT_AMBULATORY_CARE_PROVIDER_SITE_OTHER): Payer: BLUE CROSS/BLUE SHIELD | Admitting: Family Medicine

## 2017-04-06 ENCOUNTER — Encounter: Payer: Self-pay | Admitting: Family Medicine

## 2017-04-06 VITALS — BP 118/74 | HR 54 | Temp 97.7°F | Resp 16 | Ht 66.0 in | Wt 181.5 lb

## 2017-04-06 DIAGNOSIS — M25551 Pain in right hip: Secondary | ICD-10-CM | POA: Diagnosis not present

## 2017-04-06 DIAGNOSIS — Z8551 Personal history of malignant neoplasm of bladder: Secondary | ICD-10-CM | POA: Diagnosis not present

## 2017-04-06 DIAGNOSIS — M4698 Unspecified inflammatory spondylopathy, sacral and sacrococcygeal region: Secondary | ICD-10-CM | POA: Diagnosis not present

## 2017-04-06 DIAGNOSIS — M47818 Spondylosis without myelopathy or radiculopathy, sacral and sacrococcygeal region: Secondary | ICD-10-CM

## 2017-04-06 DIAGNOSIS — Z23 Encounter for immunization: Secondary | ICD-10-CM

## 2017-04-06 MED ORDER — HYDROCODONE-ACETAMINOPHEN 5-325 MG PO TABS: 1.0000 | ORAL_TABLET | Freq: Four times a day (QID) | ORAL | 0 refills | Status: DC | PRN

## 2017-04-06 MED ORDER — PREDNISONE 20 MG PO TABS: ORAL_TABLET | ORAL | 0 refills | Status: DC

## 2017-04-06 NOTE — Progress Notes (Signed)
OFFICE VISIT  04/06/2017   CC:  Chief Complaint  Patient presents with  . Hip Pain    right x 4 days   HPI:    Patient is a 61 y.o. Caucasian male who presents for right hip pain. Onset 4 d/a, woke up with pain.  No recent change in activity level/overuse, no trauma. Back of hip painful--feels deep--hurts esp when stands up and sometimes when walking.  No pain with sitting. No radiation of the pain is noted.  Intensity 8/10.  Pain is intermittent. No chronic hip pain.  No recent chronic LBP since getting lumbar spine surgery in the remote past.  Other pertinent PMH: Bladder cancer 2012. Bone scan 2012:  + arthritic activity both knees, no sign of bone metastases.  No appetite change.  No wt loss.  No fevers.  Past Medical History:  Diagnosis Date  . Arthritis   . Ascending aorta dilatation (HCC)    a. Ascending aorta mildly dilated by echo 09/03/15.  . Bladder cancer [facility]) 2012   Mass removed, then BCG by urologist (Dr. Alinda Money).  Recurrence documented on cysto + bladder washings 10/14/15--getting more maintenance BCG.  Cysto 01/20/16 OK, then more BCG given.  Cysto 04/13/16 c/w just having finished BCG but cytology/FISH ok.  Cysto 07/27/2016 c/w small area of BCG related inflamm vs recurrent carcinoma in situ--washings for cytology done, BCG stopped due to severe sx's.  . Bladder cancer ([place])    11/02/16 cysto showed 1.5 cm raised erythematous lesion suspicious for recurrence--cystoscopic resection was done and path was benign 10/2016.  Surveillance cystoscopy 6 mo is urol plan  . BPH (benign prostatic hyperplasia)    with hx of acute prostatitis  . CAD (coronary artery disease)    a. '05 MI > DES to circumflex. b. NSTEMI 08/2015 with total occlusion of the mid LAD along with 20% mid-distal Cx, 100% dCX, 40% D1, 50% pCx, 50% m-dRCA, 80% dRCA.Given >12 hours out from symptom onset, medical therapy recommended.  . Complication of anesthesia    pt awakens aggitated and aggressive   .  Diabetes mellitus type 2, controlled ([place])    HbA1c 02/2011 was 6.6%  . Elevated PSA 09/2011   with prostate nodule.  Prostate bx ALL BENIGN 10/10/11 (Dr. Christella Hartigan followed by Burman Freestone, next one planned for 05/2017.  Marland Kitchen GERD (gastroesophageal reflux disease)    occasional  . Headache(784.0)    cluster headaches a long time ago  . History of bronchitis   . History of vertigo   . HTN (hypertension)   . Hyperlipidemia   . Ischemic cardiomyopathy    a. 2D Echo 09/03/15: mild LVH, EF 40-45%, diffuse HK, akinesis of apical myocardium, grade 1 DD, aortic root dimention 38mm, ascending aorta mildly dilated, mildly dilated RV/RA.   Marland Kitchen Myocardial infarction [facility]) 2005; 08/2015   followed by Dr. Stanford Breed  . Thrombocytopenia ([place])    Stable 2012-2017  . Tobacco abuse   . Urinary tract infection   . Wolf-Parkinson-White syndrome     Past Surgical History:  Procedure Laterality Date  . BLADDER TUMOR EXCISION  11/08/10; 05/2015   urothelial carcinoma (Dr. Alinda Money).  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  Marland Kitchen CARDIAC CATHETERIZATION  2005   with stent placement  . CARDIAC CATHETERIZATION N/A 09/01/2015   Procedure: Left Heart Cath and Coronary Angiography;  Surgeon: Belva Crome, MD;  Location: [place] [facility];  Service: Cardiovascular;  Laterality: N/A;  . COLONOSCOPY  2010  Dr. Isa Rankin, repeat 2020.  Patient wants to go to different GI when time for repeat.  . CYSTOSCOPY W/ RETROGRADES Bilateral 06/22/2015   Procedure: CYSTOSCOPY WITH RETROGRADE PYELOGRAM;  Surgeon: Raynelle Bring, MD;  [facility];  Service: Urology;  Laterality: Bilateral;  . CYSTOSCOPY W/ RETROGRADES N/A 11/17/2016   Procedure: CYSTOSCOPY WITH RETROGRADE PYELOGRAM;  Surgeon: Raynelle Bring, MD;  [facility];  Service: Urology;  Laterality: N/A;  . CYSTOSCOPY WITH RETROGRADE PYELOGRAM, URETEROSCOPY AND STENT PLACEMENT N/A 06/06/2013   Procedure: CYSTOSCOPY WITH RETROGRADE  PYELOGRAM;  Surgeon: Dutch Gray, MD;  [facility];  Service: Urology;  Laterality: N/A;  . lexiscan  2012   Normal/negative  . [place] in [place] ([place])-no trouble since last surgery  . myocardial perfusion study  2009   Neg ischemia, EF 52%  . TRANSURETHRAL RESECTION OF BLADDER TUMOR N/A 06/06/2013   Procedure: TRANSURETHRAL RESECTION OF BLADDER TUMOR (TURBT);  Surgeon: Dutch Gray, MD;  [facility];  Service: Urology;  Laterality: N/A;.  PATH: squamous metaplasia, fibrosis with chronic inflammation, no malignancy.  . TRANSURETHRAL RESECTION OF BLADDER TUMOR N/A 06/22/2015   Carcinoma in situ: no invasive malignancy.  Procedure: TRANSURETHRAL RESECTION OF BLADDER TUMOR (TURBT);  Surgeon: Raynelle Bring, MD;  [facility];  Service: Urology;  Laterality: N/A;  **BLADDER BIOPSY*RESECTION**  . TRANSURETHRAL RESECTION OF BLADDER TUMOR N/A 11/17/2016   Path: BENIGN UROTHELIUM WITH INFLAMMATION--no malignancy. Procedure: TRANSURETHRAL RESECTION OF BLADDER TUMOR (TURBT);  Surgeon: Raynelle Bring, MD;  [facility];  Service: Urology;  Laterality: N/A;  . VASECTOMY      Outpatient Medications Prior to Visit  Medication Sig Dispense Refill  . acetaminophen (TYLENOL) 500 MG tablet Take 1,000 mg by mouth daily as needed for mild pain or headache.     Marland Kitchen aspirin EC 81 MG tablet Take 81 mg by mouth daily.    Marland Kitchen atenolol (TENORMIN) 50 MG tablet TAKE ONE TABLET BY MOUTH EVERY DAY WITH LUNCH 90 tablet 3  . atorvastatin (LIPITOR) 80 MG tablet TAKE ONE TABLET BY MOUTH EVERY EVENING 90 tablet 1  . calcium carbonate (TUMS - DOSED IN MG ELEMENTAL CALCIUM) 500 MG chewable tablet Chew 1 tablet by mouth daily as needed for indigestion or heartburn.    . fluticasone (FLONASE) 50 MCG/ACT nasal spray Place 1 spray into both nostrils daily as needed for allergies. 16 g 3  . GLIPIZIDE XL 10 MG 24 hr tablet TAKE ONE TABLET BY MOUTH EVERY DAY WITH LUNCH 90 tablet 1  .  lisinopril (PRINIVIL,ZESTRIL) 20 MG tablet TAKE ONE TABLET BY MOUTH EVERY DAY WITH LUNCH 90 tablet 1  . metFORMIN (GLUCOPHAGE) 1000 MG tablet TAKE ONE TABLET BY MOUTH TWICE DAILY WITH A MEAL (Patient taking differently: Take 1,000 mg by mouth 2 (two) times daily with a meal. TAKE ONE TABLET BY MOUTH TWICE DAILY WITH A MEAL) 180 tablet 1  . nitroGLYCERIN (NITROSTAT) 0.4 MG SL tablet Place 1 tablet (0.4 mg total) under the tongue every 5 (five) minutes as needed for chest pain (up to 3 doses). 25 tablet 3  . oxybutynin (DITROPAN) 5 MG tablet Take 5 mg by mouth daily as needed for bladder spasms.     . phenazopyridine (PYRIDIUM) 100 MG tablet Take 1 tablet (100 mg total) by mouth 3 (three) times daily as needed for pain (for burning). 20 tablet 0  . tamsulosin (FLOMAX) 0.4 MG CAPS capsule TAKE 1 CAPSULE BY MOUTH EVERY DAY  AFTER SUPPER (Patient taking differently: Take 0.4mg s by mouth daily in the morning) 90 capsule 3  . metFORMIN (GLUCOPHAGE) 1000 MG tablet TAKE ONE TABLET BY MOUTH TWICE DAILY WITH A MEAL (Patient not taking: Reported on 04/06/2017) 180 tablet 1   No facility-administered medications prior to visit.     No Known Allergies  ROS As per HPI  PE: Blood pressure 118/74, pulse (!) 54, temperature 97.7 F (36.5 C), temperature source Oral, resp. rate 16, height 5\' 6"  (1.676 m), weight 181 lb 8 oz (82.3 kg), SpO2 98 %. Gen: Alert, well appearing.  Patient is oriented to person, place, time, and situation. AFFECT: pleasant, lucid thought and speech. Slow to rise from chair and winces when stepping on R foot/leg. No ischial tuberosity pain on R. No tenderness to palpation anywhere on R hip or glut region except just a touch of discomfort with palpation over right iliac crest in LB soft tissues and just a touch over proximal R SI joint region. ROM: flexion and extension elicit no pain in R hip.  ER>>>IR pain in right post glut/SI joint area.   LE strength 5/5 prox/dist bilat.  LABS:     Chemistry      Component Value Date/Time   NA 142 02/14/2017 0828   K 4.3 02/14/2017 0828   CL 106 02/14/2017 0828   CO2 31 02/14/2017 0828   BUN 12 02/14/2017 0828   CREATININE 1.16 02/14/2017 0828   GLU 133 04/16/2016      Component Value Date/Time   CALCIUM 9.5 02/14/2017 0828   ALKPHOS 64 09/06/2016 0845   AST 21 09/06/2016 0845   ALT 24 09/06/2016 0845   BILITOT 0.8 09/06/2016 0845     Lab Results  Component Value Date   WBC 7.1 11/08/2016   HGB 15.4 11/08/2016   HCT 43.5 11/08/2016   MCV 86.3 11/08/2016   PLT 113 (L) 11/08/2016   Lab Results  Component Value Date   HGBA1C 6.5 02/14/2017    IMPRESSION AND PLAN:  Acute R hip pain; question of pain arising from hip joint vs SI joint. Check DG plain film R hip/pelvis. Prednisone 40mg  qd x 5d for possible SI joint inflammation.  Stop any NSAIDs for now. Vicodin 5/325, 1-2 q6h prn, #30. If not improved significantly at f/u in 7d (and if x-ray is normal), then I'll proceed with more advanced imaging (MRI hip vs bone scan).  An After Visit Summary was printed and given to the patient.  Spent 25 min with pt today, with >50% of this time spent in counseling and care coordination regarding the above problems.  FOLLOW UP: Return in about 1 week (around 04/13/2017) for f/u hip pain.  Signed:  Crissie Sickles, MD           04/06/2017

## 2017-04-13 ENCOUNTER — Ambulatory Visit: Payer: BLUE CROSS/BLUE SHIELD | Admitting: Family Medicine

## 2017-04-14 ENCOUNTER — Encounter: Payer: Self-pay | Admitting: Family Medicine

## 2017-04-14 ENCOUNTER — Ambulatory Visit (INDEPENDENT_AMBULATORY_CARE_PROVIDER_SITE_OTHER): Payer: BLUE CROSS/BLUE SHIELD | Admitting: Family Medicine

## 2017-04-14 VITALS — BP 111/77 | HR 67 | Temp 97.8°F | Resp 16 | Wt 179.0 lb

## 2017-04-14 DIAGNOSIS — M25551 Pain in right hip: Secondary | ICD-10-CM

## 2017-04-14 DIAGNOSIS — I25118 Atherosclerotic heart disease of native coronary artery with other forms of angina pectoris: Secondary | ICD-10-CM

## 2017-04-14 DIAGNOSIS — I208 Other forms of angina pectoris: Secondary | ICD-10-CM

## 2017-04-14 NOTE — Progress Notes (Signed)
OFFICE VISIT  04/14/2017   CC:  Chief Complaint  Patient presents with  . Hip Pain    follow up     HPI:    Patient is a 61 y.o. Caucasian male who presents for 1 week f/u acute R hip pain. X-ray normal last visit. Hip no longer hurts during the day, just hurting at night some, wakes him up from sleep, has some trouble getting comfortable for a while and it settles down.  Appetite good.  No abnl wt loss. Pain still located in R lat glut area.   He has not taken any vicodin in the last 5d.  He has had longstanding problem of some CP about 1 time per month, left upper chest and shoulder and down L arm.  Occurs after activity when he is resting or slowed down. He had an episode this morning after feeding animals, lasted about 10 min.  No diaphoresis, no nausea, no SOB. HE did not take any NTG.  Currently without chest pain or arm/shoulder/jaw pain.  This episode is consistent with his usual infrequent episodes of angina that he says he has had since having MI + Cath last year. When he had his MI in 2017 he had same sx's but much stronger and it did not abate (see PMH/PSH section for further details regarding pt's hx of coronary dz).  ROS: no fever, no recent chest wall or shoulder/arm injury, no dizziness, no GER sx's. No melena or hematochezia.  No radicular pain, no paresthesias.   Past Medical History:  Diagnosis Date  . Arthritis   . Ascending aorta dilatation (HCC)    a. Ascending aorta mildly dilated by echo 09/03/15.  . Bladder cancer [facility]) 2012   Mass removed, then BCG by urologist (Dr. Alinda Money).  Recurrence documented on cysto + bladder washings 10/14/15--getting more maintenance BCG.  Cysto 01/20/16 OK, then more BCG given.  Cysto 04/13/16 c/w just having finished BCG but cytology/FISH ok.  Cysto 07/27/2016 c/w small area of BCG related inflamm vs recurrent carcinoma in situ--washings for cytology done, BCG stopped due to severe sx's.  . Bladder cancer ([place])    11/02/16 cysto showed  1.5 cm raised erythematous lesion suspicious for recurrence--cystoscopic resection was done and path was benign 10/2016.  Surveillance cystoscopy 6 mo is urol plan  . BPH (benign prostatic hyperplasia)    with hx of acute prostatitis  . CAD (coronary artery disease)    a. '05 MI > DES to circumflex. b. NSTEMI 08/2015 with total occlusion of the mid LAD along with 20% mid-distal Cx, 100% dCX, 40% D1, 50% pCx, 50% m-dRCA, 80% dRCA.Given >12 hours out from symptom onset, medical therapy recommended.  . Complication of anesthesia    pt awakens aggitated and aggressive   . Diabetes mellitus type 2, controlled ([place])    HbA1c 02/2011 was 6.6%  . Elevated PSA 09/2011   with prostate nodule.  Prostate bx ALL BENIGN 10/10/11 (Dr. Christella Hartigan followed by Burman Freestone, next one planned for 05/2017.  Marland Kitchen GERD (gastroesophageal reflux disease)    occasional  . Headache(784.0)    cluster headaches a long time ago  . History of bronchitis   . History of vertigo   . HTN (hypertension)   . Hyperlipidemia   . Ischemic cardiomyopathy    a. 2D Echo 09/03/15: mild LVH, EF 40-45%, diffuse HK, akinesis of apical myocardium, grade 1 DD, aortic root dimention 21mm, ascending aorta mildly dilated, mildly dilated RV/RA.   Marland Kitchen Myocardial infarction [facility]) 2005; 08/2015  followed by Dr. Stanford Breed  . Thrombocytopenia ([place])    Stable 2012-2017  . Tobacco abuse   . Urinary tract infection   . Wolf-Parkinson-White syndrome     Past Surgical History:  Procedure Laterality Date  . BLADDER TUMOR EXCISION  11/08/10; 05/2015   urothelial carcinoma (Dr. Alinda Money).  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  Marland Kitchen CARDIAC CATHETERIZATION  2005   with stent placement  . CARDIAC CATHETERIZATION N/A 09/01/2015   Procedure: Left Heart Cath and Coronary Angiography;  Surgeon: Belva Crome, MD;  Location: [place] [facility];  Service: Cardiovascular;  Laterality: N/A;  . COLONOSCOPY  2010   Dr. Isa Rankin,  repeat 2020.  Patient wants to go to different GI when time for repeat.  . CYSTOSCOPY W/ RETROGRADES Bilateral 06/22/2015   Procedure: CYSTOSCOPY WITH RETROGRADE PYELOGRAM;  Surgeon: Raynelle Bring, MD;  [facility];  Service: Urology;  Laterality: Bilateral;  . CYSTOSCOPY W/ RETROGRADES N/A 11/17/2016   Procedure: CYSTOSCOPY WITH RETROGRADE PYELOGRAM;  Surgeon: Raynelle Bring, MD;  [facility];  Service: Urology;  Laterality: N/A;  . CYSTOSCOPY WITH RETROGRADE PYELOGRAM, URETEROSCOPY AND STENT PLACEMENT N/A 06/06/2013   Procedure: CYSTOSCOPY WITH RETROGRADE PYELOGRAM;  Surgeon: Dutch Gray, MD;  [facility];  Service: Urology;  Laterality: N/A;  . lexiscan  2012   Normal/negative  . [place] in [place] ([place])-no trouble since last surgery  . myocardial perfusion study  2009   Neg ischemia, EF 52%  . TRANSURETHRAL RESECTION OF BLADDER TUMOR N/A 06/06/2013   Procedure: TRANSURETHRAL RESECTION OF BLADDER TUMOR (TURBT);  Surgeon: Dutch Gray, MD;  [facility];  Service: Urology;  Laterality: N/A;.  PATH: squamous metaplasia, fibrosis with chronic inflammation, no malignancy.  . TRANSURETHRAL RESECTION OF BLADDER TUMOR N/A 06/22/2015   Carcinoma in situ: no invasive malignancy.  Procedure: TRANSURETHRAL RESECTION OF BLADDER TUMOR (TURBT);  Surgeon: Raynelle Bring, MD;  [facility];  Service: Urology;  Laterality: N/A;  **BLADDER BIOPSY*RESECTION**  . TRANSURETHRAL RESECTION OF BLADDER TUMOR N/A 11/17/2016   Path: BENIGN UROTHELIUM WITH INFLAMMATION--no malignancy. Procedure: TRANSURETHRAL RESECTION OF BLADDER TUMOR (TURBT);  Surgeon: Raynelle Bring, MD;  [facility];  Service: Urology;  Laterality: N/A;  . VASECTOMY      Outpatient Medications Prior to Visit  Medication Sig Dispense Refill  . acetaminophen (TYLENOL) 500 MG tablet Take 1,000 mg by mouth daily as needed for mild pain or headache.     Marland Kitchen aspirin EC 81 MG tablet Take 81 mg  by mouth daily.    Marland Kitchen atenolol (TENORMIN) 50 MG tablet TAKE ONE TABLET BY MOUTH EVERY DAY WITH LUNCH 90 tablet 3  . atorvastatin (LIPITOR) 80 MG tablet TAKE ONE TABLET BY MOUTH EVERY EVENING 90 tablet 1  . calcium carbonate (TUMS - DOSED IN MG ELEMENTAL CALCIUM) 500 MG chewable tablet Chew 1 tablet by mouth daily as needed for indigestion or heartburn.    . fluticasone (FLONASE) 50 MCG/ACT nasal spray Place 1 spray into both nostrils daily as needed for allergies. 16 g 3  . GLIPIZIDE XL 10 MG 24 hr tablet TAKE ONE TABLET BY MOUTH EVERY DAY WITH LUNCH 90 tablet 1  . HYDROcodone-acetaminophen (NORCO/VICODIN) 5-325 MG tablet Take 1-2 tablets by mouth every 6 (six) hours as needed for moderate pain. 30 tablet 0  . lisinopril (PRINIVIL,ZESTRIL) 20 MG tablet TAKE ONE TABLET BY MOUTH EVERY DAY WITH LUNCH 90 tablet 1  .  metFORMIN (GLUCOPHAGE) 1000 MG tablet TAKE ONE TABLET BY MOUTH TWICE DAILY WITH A MEAL (Patient taking differently: Take 1,000 mg by mouth 2 (two) times daily with a meal. TAKE ONE TABLET BY MOUTH TWICE DAILY WITH A MEAL) 180 tablet 1  . nitroGLYCERIN (NITROSTAT) 0.4 MG SL tablet Place 1 tablet (0.4 mg total) under the tongue every 5 (five) minutes as needed for chest pain (up to 3 doses). 25 tablet 3  . oxybutynin (DITROPAN) 5 MG tablet Take 5 mg by mouth daily as needed for bladder spasms.     . phenazopyridine (PYRIDIUM) 100 MG tablet Take 1 tablet (100 mg total) by mouth 3 (three) times daily as needed for pain (for burning). 20 tablet 0  . tamsulosin (FLOMAX) 0.4 MG CAPS capsule TAKE 1 CAPSULE BY MOUTH EVERY DAY AFTER SUPPER (Patient taking differently: Take 0.4mg s by mouth daily in the morning) 90 capsule 3  . predniSONE (DELTASONE) 20 MG tablet 2 tabs po qd x 5d (Patient not taking: Reported on 04/14/2017) 10 tablet 0   No facility-administered medications prior to visit.     No Known Allergies  ROS As per HPI  PE: Blood pressure 111/77, pulse 67, temperature 97.8 F (36.6 C),  temperature source Oral, resp. rate 16, weight 179 lb (81.2 kg), SpO2 97 %. Gen: Alert, well appearing.  Patient is oriented to person, place, time, and situation. AFFECT: pleasant, lucid thought and speech. CV: RRR, no m/r/g.   LUNGS: CTA bilat, nonlabored resps, good aeration in all lung fields. EXT: no clubbing, cyanosis, or edema.   LABS:   12 lead EKG today: sinus rhythm, rate 67, no acute ischemic changes, Q waves in III and aVF, low voltage aVL and III and aVF.  Compared to EKG 11/29/16 there is no change except his R  Wave transition in precordial leads has improved on present EKG.    Chemistry      Component Value Date/Time   NA 142 02/14/2017 0828   K 4.3 02/14/2017 0828   CL 106 02/14/2017 0828   CO2 31 02/14/2017 0828   BUN 12 02/14/2017 0828   CREATININE 1.16 02/14/2017 0828   GLU 133 04/16/2016      Component Value Date/Time   CALCIUM 9.5 02/14/2017 0828   ALKPHOS 64 09/06/2016 0845   AST 21 09/06/2016 0845   ALT 24 09/06/2016 0845   BILITOT 0.8 09/06/2016 0845       IMPRESSION AND PLAN:  1) right hip pain, improved. Musculoskeletal vs mild sciatica.  Hip/pelvis x-ray normal. We'll give this more time: watchful waiting approach.  2) Angina; stable. EKG w/out acute ischemic changes, no change compared to EKG 11/2016. Currently w/out sx's. Discussed this with pt: option of going to ED for further eval vs watchful waiting (treating as stable angina). He is in favor of no further eval at this time, and I agree with this. However, I recommended that he got to ED or call 911 if he should have recurrence of CP today or over the next couple of days.  An After Visit Summary was printed and given to the patient.  FOLLOW UP: Return for keep scheduled appt with me 06/05/17.  Signed:  Crissie Sickles, MD           04/14/2017

## 2017-05-08 ENCOUNTER — Other Ambulatory Visit: Payer: Self-pay | Admitting: Family Medicine

## 2017-05-14 IMAGING — CR DG CHEST 2V
2 series · 2 of 2 positions shown · non-contrast
Comparison: May 31, 2013.

CLINICAL DATA: Preop cystoscopy.

EXAM:
CHEST  2 VIEW

[w chest pa]
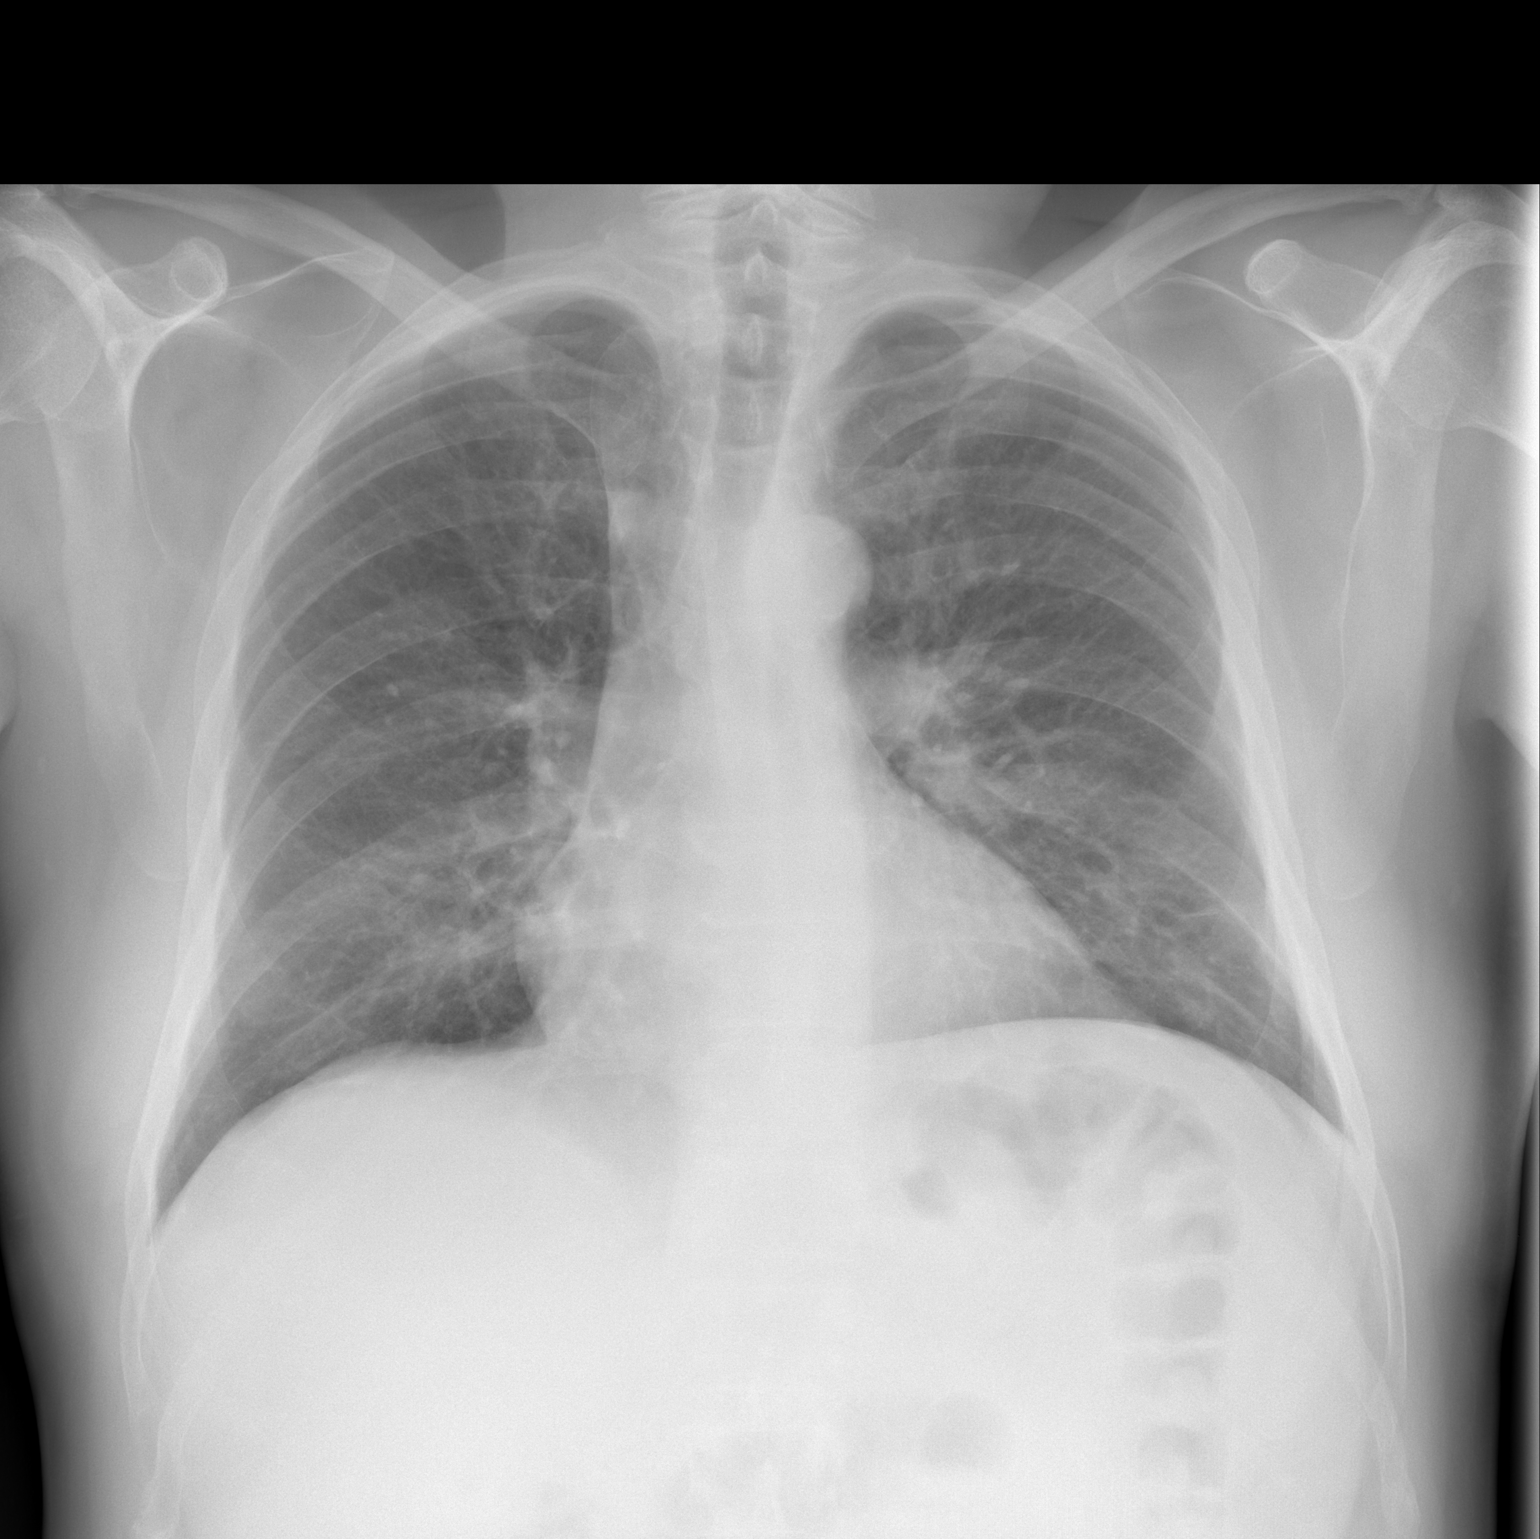

[w chest lat]
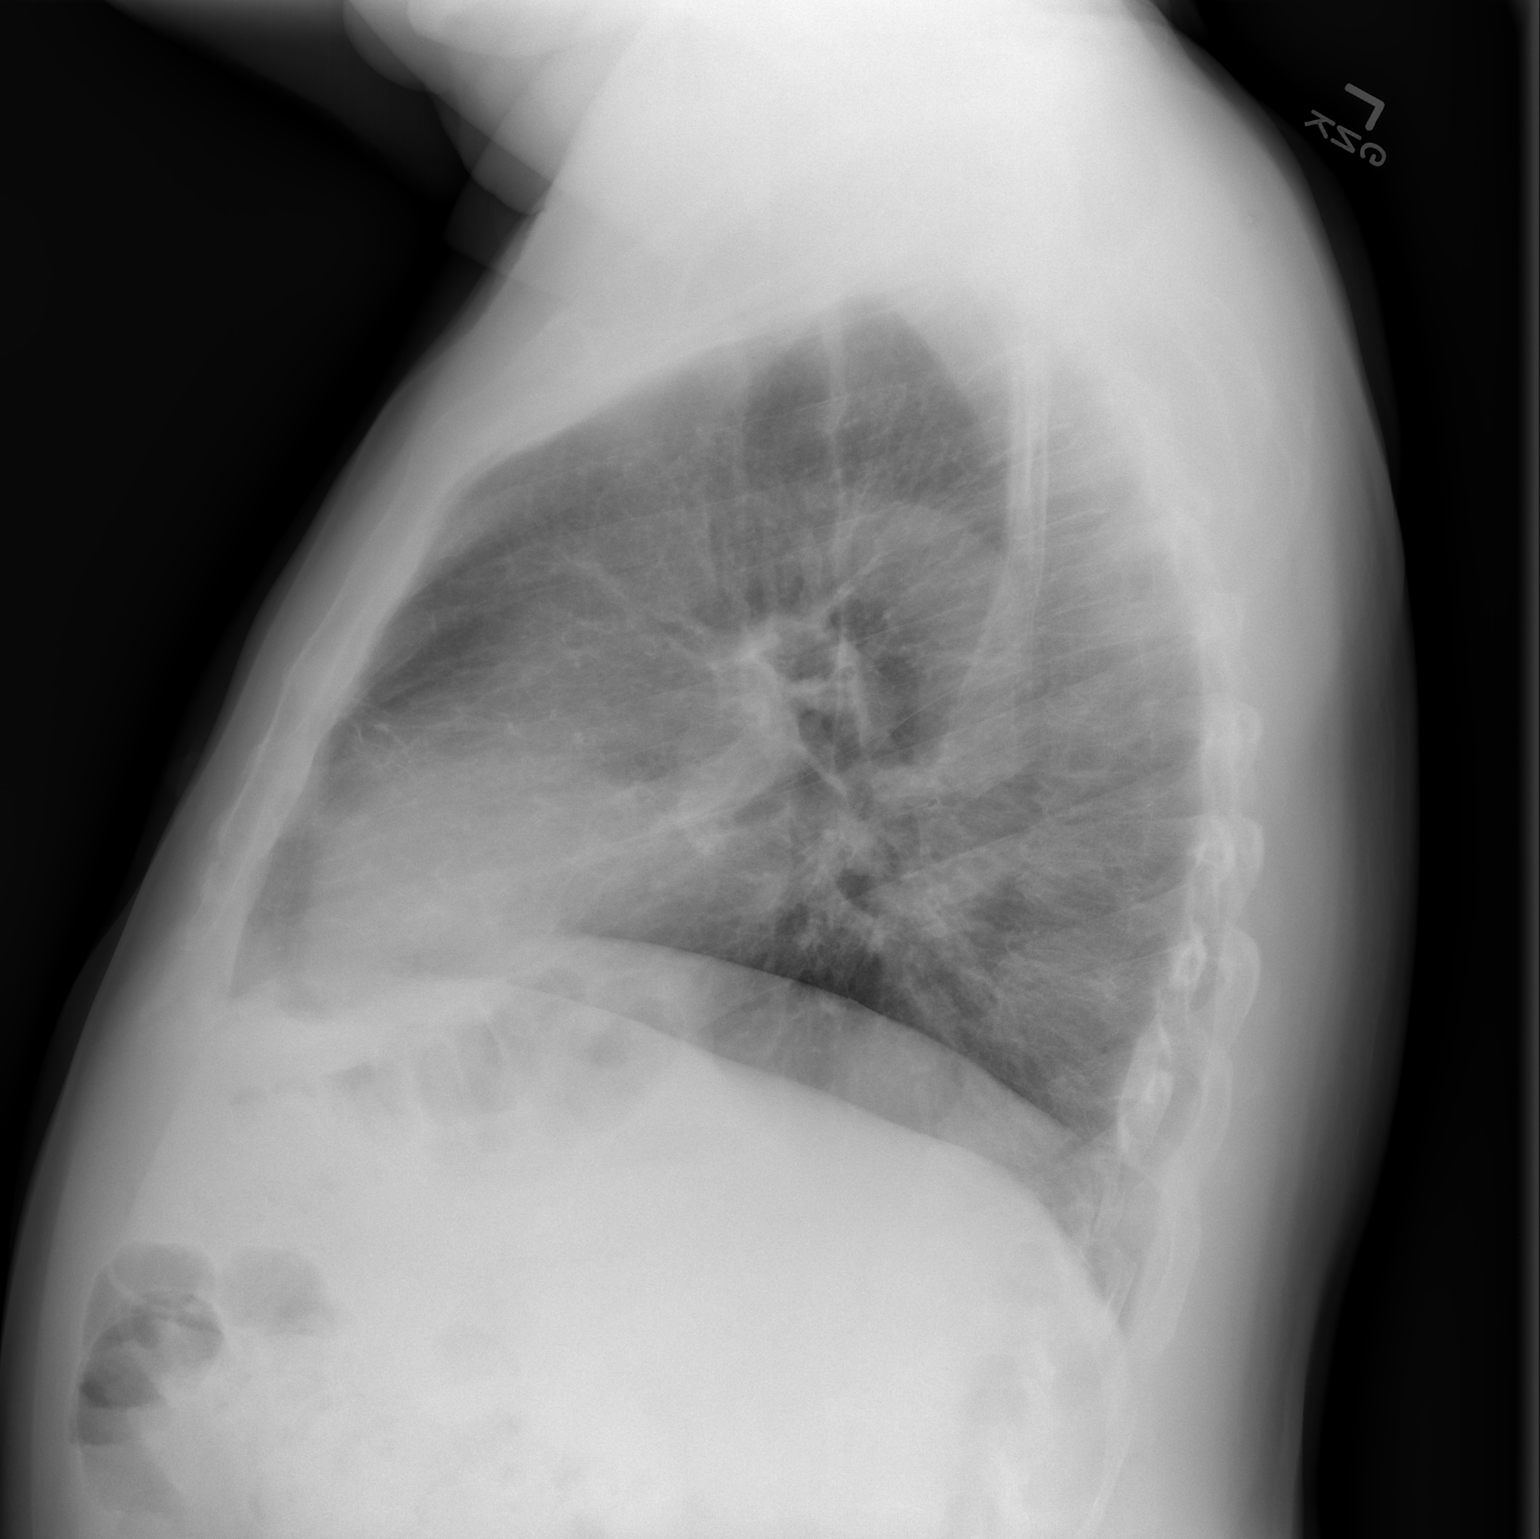

[2 of 2 positions shown; findings below may reference images not displayed]

FINDINGS: The heart size and mediastinal contours are within normal limits.
Both lungs are clear. No pneumothorax or pleural effusion is noted.
The visualized skeletal structures are unremarkable.
IMPRESSION: No active cardiopulmonary disease.

## 2017-06-05 ENCOUNTER — Other Ambulatory Visit: Payer: Self-pay

## 2017-06-05 ENCOUNTER — Ambulatory Visit: Payer: BLUE CROSS/BLUE SHIELD | Admitting: Family Medicine

## 2017-06-05 ENCOUNTER — Encounter: Payer: Self-pay | Admitting: Family Medicine

## 2017-06-05 VITALS — BP 98/62 | HR 57 | Temp 98.1°F | Resp 16 | Ht 66.0 in | Wt 186.5 lb

## 2017-06-05 DIAGNOSIS — I10 Essential (primary) hypertension: Secondary | ICD-10-CM | POA: Diagnosis not present

## 2017-06-05 DIAGNOSIS — E7800 Pure hypercholesterolemia, unspecified: Secondary | ICD-10-CM | POA: Diagnosis not present

## 2017-06-05 DIAGNOSIS — D696 Thrombocytopenia, unspecified: Secondary | ICD-10-CM | POA: Diagnosis not present

## 2017-06-05 DIAGNOSIS — E119 Type 2 diabetes mellitus without complications: Secondary | ICD-10-CM | POA: Diagnosis not present

## 2017-06-05 LAB — BASIC METABOLIC PANEL
BUN: 14 mg/dL (ref 6–23)
CO2: 28 mEq/L (ref 19–32)
CREATININE: 1.08 mg/dL (ref 0.40–1.50)
Calcium: 9.5 mg/dL (ref 8.4–10.5)
Chloride: 105 mEq/L (ref 96–112)
GFR: 73.64 mL/min (ref >60.00)
GLUCOSE: 121 mg/dL — AB (ref 70–99)
Potassium: 4.4 mEq/L (ref 3.5–5.1)
Sodium: 140 mEq/L (ref 135–145)

## 2017-06-05 LAB — LIPID PANEL
CHOLESTEROL: 86 mg/dL (ref 0–200)
HDL: 26.7 mg/dL — ABNORMAL LOW (ref >39.00)
LDL Cholesterol: 41 mg/dL (ref 0–99)
NONHDL: 59.57
Total CHOL/HDL Ratio: 3
Triglycerides: 92 mg/dL (ref 0.0–149.0)
VLDL: 18.4 mg/dL (ref 0.0–40.0)

## 2017-06-05 LAB — CBC
HCT: 45.7 % (ref 39.0–52.0)
Hemoglobin: 15.7 g/dL (ref 13.0–17.0)
MCHC: 34.4 g/dL (ref 30.0–36.0)
MCV: 91.8 fl (ref 78.0–100.0)
Platelets: 115 10*3/uL — ABNORMAL LOW (ref 150.0–400.0)
RBC: 4.97 Mil/uL (ref 4.22–5.81)
RDW: 14.1 % (ref 11.5–15.5)
WBC: 7.6 10*3/uL (ref 4.0–10.5)

## 2017-06-05 LAB — HEMOGLOBIN A1C: Hgb A1c MFr Bld: 7.3 % — ABNORMAL HIGH (ref 4.6–6.5)

## 2017-06-05 NOTE — Progress Notes (Signed)
OFFICE VISIT  06/05/2017   CC:  Chief Complaint  Patient presents with  . Follow-up    RCI, pt is fasting.     HPI:    Patient is a 61 y.o. Caucasian male who presents for f/u DM 2, HTN, HLD.  DM: says avg about 100 - 110. No burning, tingling, or numbness in feet.  HTN: occ bp check is usually 110/60 or so.  NO orthostatic dizziness.   No HA, no palpit, no CP, no SOB,  HLD: taking 80 mg atorva daily.  No side effects.  No fevers, no cough, no rash, no myalgias.  Past Medical History:  Diagnosis Date  . Arthritis   . Ascending aorta dilatation (HCC)    a. Ascending aorta mildly dilated by echo 09/03/15.  . Bladder cancer [facility]) 2012   Mass removed, then BCG by urologist (Dr. Alinda Money).  Recurrence documented on cysto + bladder washings 10/14/15--getting more maintenance BCG.  Cysto 01/20/16 OK, then more BCG given.  Cysto 04/13/16 c/w just having finished BCG but cytology/FISH ok.  Cysto 07/27/2016 c/w small area of BCG related inflamm vs recurrent carcinoma in situ--washings for cytology done, BCG stopped due to severe sx's.  . Bladder cancer ([place])    11/02/16 cysto showed 1.5 cm raised erythematous lesion suspicious for recurrence--cystoscopic resection was done and path was benign 10/2016.  Surveillance cystoscopy 6 mo is urol plan  . BPH (benign prostatic hyperplasia)    with hx of acute prostatitis  . CAD (coronary artery disease)    a. '05 MI > DES to circumflex. b. NSTEMI 08/2015 with total occlusion of the mid LAD along with 20% mid-distal Cx, 100% dCX, 40% D1, 50% pCx, 50% m-dRCA, 80% dRCA.Given >12 hours out from symptom onset, medical therapy recommended.  . Complication of anesthesia    pt awakens aggitated and aggressive   . Diabetes mellitus type 2, controlled ([place])    HbA1c 02/2011 was 6.6%  . Elevated PSA 09/2011   with prostate nodule.  Prostate bx ALL BENIGN 10/10/11 (Dr. Christella Hartigan followed by [facility] Freestone, next one planned for 05/2017.  Marland Kitchen GERD (gastroesophageal reflux  disease)    occasional  . Headache(784.0)    cluster headaches a long time ago  . History of bronchitis   . History of vertigo   . HTN (hypertension)   . Hyperlipidemia   . Ischemic cardiomyopathy    a. 2D Echo 09/03/15: mild LVH, EF 40-45%, diffuse HK, akinesis of apical myocardium, grade 1 DD, aortic root dimention 58mm, ascending aorta mildly dilated, mildly dilated RV/RA.   Marland Kitchen Myocardial infarction [facility]) 2005; 08/2015   followed by Dr. Stanford Breed  . Thrombocytopenia ([place])    Stable 2012-2017  . Tobacco abuse   . Urinary tract infection   . Wolf-Parkinson-White syndrome     Past Surgical History:  Procedure Laterality Date  . BLADDER TUMOR EXCISION  11/08/10; 05/2015   urothelial carcinoma (Dr. Alinda Money).  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  Marland Kitchen CARDIAC CATHETERIZATION  2005   with stent placement  . COLONOSCOPY  2010   Dr. Isa Rankin, repeat 2020.  Patient wants to go to different GI when time for repeat.  Marland Kitchen lexiscan  2012   Normal/negative  . [place] in [place] ([place])-no trouble since last surgery  . myocardial perfusion study  2009   Neg ischemia, EF 52%  . VASECTOMY      Outpatient Medications Prior to Visit  Medication Sig Dispense Refill  . acetaminophen (TYLENOL) 500 MG tablet Take 1,000 mg by mouth daily as needed for mild pain or headache.     Marland Kitchen aspirin EC 81 MG tablet Take 81 mg by mouth daily.    Marland Kitchen atenolol (TENORMIN) 50 MG tablet TAKE ONE TABLET BY MOUTH EVERY DAY WITH LUNCH 90 tablet 1  . atorvastatin (LIPITOR) 80 MG tablet TAKE ONE TABLET BY MOUTH EVERY EVENING 90 tablet 1  . calcium carbonate (TUMS - DOSED IN MG ELEMENTAL CALCIUM) 500 MG chewable tablet Chew 1 tablet by mouth daily as needed for indigestion or heartburn.    . fluticasone (FLONASE) 50 MCG/ACT nasal spray Place 1 spray into both nostrils daily as needed for allergies. 16 g 3  . GLIPIZIDE XL 10 MG 24 hr tablet TAKE  ONE TABLET BY MOUTH EVERY DAY WITH LUNCH 90 tablet 1  . HYDROcodone-acetaminophen (NORCO/VICODIN) 5-325 MG tablet Take 1-2 tablets by mouth every 6 (six) hours as needed for moderate pain. 30 tablet 0  . lisinopril (PRINIVIL,ZESTRIL) 20 MG tablet TAKE ONE TABLET BY MOUTH EVERY DAY WITH LUNCH 90 tablet 1  . metFORMIN (GLUCOPHAGE) 1000 MG tablet TAKE ONE TABLET BY MOUTH TWICE DAILY WITH A MEAL (Patient taking differently: Take 1,000 mg by mouth 2 (two) times daily with a meal. TAKE ONE TABLET BY MOUTH TWICE DAILY WITH A MEAL) 180 tablet 1  . nitroGLYCERIN (NITROSTAT) 0.4 MG SL tablet Place 1 tablet (0.4 mg total) under the tongue every 5 (five) minutes as needed for chest pain (up to 3 doses). 25 tablet 3  . oxybutynin (DITROPAN) 5 MG tablet Take 5 mg by mouth daily as needed for bladder spasms.     . phenazopyridine (PYRIDIUM) 100 MG tablet Take 1 tablet (100 mg total) by mouth 3 (three) times daily as needed for pain (for burning). 20 tablet 0  . tamsulosin (FLOMAX) 0.4 MG CAPS capsule TAKE 1 CAPSULE BY MOUTH EVERY DAY AFTER SUPPER 90 capsule 1  . predniSONE (DELTASONE) 20 MG tablet 2 tabs po qd x 5d (Patient not taking: Reported on 04/14/2017) 10 tablet 0   No facility-administered medications prior to visit.     No Known Allergies  ROS As per HPI  PE: Blood pressure 98/62, pulse (!) 57, temperature 98.1 F (36.7 C), temperature source Oral, resp. rate 16, height 5\' 6"  (1.676 m), weight 186 lb 8 oz (84.6 kg), SpO2 96 %. Gen: Alert, well appearing.  Patient is oriented to person, place, time, and situation. AFFECT: pleasant, lucid thought and speech. Foot exam - both sides normal; no swelling, tenderness or skin or vascular lesions. Color and temperature is normal. Sensation is intact. Peripheral pulses are palpable. Toenails are normal.  No further exam today.  LABS:  Lab Results  Component Value Date   TSH 0.96 08/26/2015   Lab Results  Component Value Date   WBC 7.1 11/08/2016    HGB 15.4 11/08/2016   HCT 43.5 11/08/2016   MCV 86.3 11/08/2016   PLT 113 (L) 11/08/2016   Lab Results  Component Value Date   CREATININE 1.16 02/14/2017   BUN 12 02/14/2017   NA 142 02/14/2017   K 4.3 02/14/2017   CL 106 02/14/2017   CO2 31 02/14/2017   Lab Results  Component Value Date   ALT 24 09/06/2016   AST 21 09/06/2016   ALKPHOS 64 09/06/2016   BILITOT 0.8 09/06/2016   Lab Results  Component Value Date   CHOL 93 09/06/2016  Lab Results  Component Value Date   HDL 30.50 (L) 09/06/2016   Lab Results  Component Value Date   LDLCALC 44 09/06/2016   Lab Results  Component Value Date   TRIG 94.0 09/06/2016   Lab Results  Component Value Date   CHOLHDL 3 09/06/2016   Lab Results  Component Value Date   PSA 2.25 01/13/2016   Lab Results  Component Value Date   HGBA1C 6.5 02/14/2017    IMPRESSION AND PLAN:  1) DM 2, historically well controlled. Home glucoses good. HbA1c today. Feet exam normal today.   Reminded pt of need for diab retpthy f/u screening.  2) HTN: well controlled.  The current medical regimen is effective;  continue present plan and medications. Lytes/cr today.  3) HLD: tolerating statin.  FLP today.  AST/ALT normal 08/2016.  4) Chronic mild thrombocytopenia: follow with cBC today.  An After Visit Summary was printed and given to the patient.  FOLLOW UP: Return in about 3 months (around 09/05/2017) for annual CPE (fasting).  Signed:  Crissie Sickles, MD           06/05/2017

## 2017-06-07 ENCOUNTER — Telehealth: Payer: Self-pay | Admitting: *Deleted

## 2017-06-07 NOTE — Telephone Encounter (Signed)
Physician Results Form for [facility] has been completed and faxed to [phone].  Original has been sent to scan and copy has been made for fax pile will be shredded in 1 month.

## 2017-06-20 ENCOUNTER — Other Ambulatory Visit: Payer: Self-pay | Admitting: Cardiology

## 2017-06-20 NOTE — Telephone Encounter (Signed)
REFILL 

## 2017-06-21 DIAGNOSIS — R972 Elevated prostate specific antigen [PSA]: Secondary | ICD-10-CM | POA: Diagnosis not present

## 2017-06-21 LAB — PSA: PSA: 1.25

## 2017-06-28 DIAGNOSIS — R972 Elevated prostate specific antigen [PSA]: Secondary | ICD-10-CM | POA: Diagnosis not present

## 2017-06-28 DIAGNOSIS — N401 Enlarged prostate with lower urinary tract symptoms: Secondary | ICD-10-CM | POA: Diagnosis not present

## 2017-06-28 DIAGNOSIS — R3914 Feeling of incomplete bladder emptying: Secondary | ICD-10-CM | POA: Diagnosis not present

## 2017-06-28 DIAGNOSIS — C678 Malignant neoplasm of overlapping sites of bladder: Secondary | ICD-10-CM | POA: Diagnosis not present

## 2017-09-04 ENCOUNTER — Encounter: Payer: BLUE CROSS/BLUE SHIELD | Admitting: Family Medicine

## 2017-09-11 ENCOUNTER — Ambulatory Visit (INDEPENDENT_AMBULATORY_CARE_PROVIDER_SITE_OTHER): Payer: BLUE CROSS/BLUE SHIELD | Admitting: Family Medicine

## 2017-09-11 ENCOUNTER — Other Ambulatory Visit: Payer: Self-pay | Admitting: Family Medicine

## 2017-09-11 ENCOUNTER — Encounter: Payer: Self-pay | Admitting: Family Medicine

## 2017-09-11 VITALS — BP 110/70 | HR 70 | Temp 98.1°F | Ht 67.0 in | Wt 184.1 lb

## 2017-09-11 DIAGNOSIS — Z0000 Encounter for general adult medical examination without abnormal findings: Secondary | ICD-10-CM

## 2017-09-11 DIAGNOSIS — I10 Essential (primary) hypertension: Secondary | ICD-10-CM

## 2017-09-11 DIAGNOSIS — E7800 Pure hypercholesterolemia, unspecified: Secondary | ICD-10-CM

## 2017-09-11 DIAGNOSIS — E119 Type 2 diabetes mellitus without complications: Secondary | ICD-10-CM

## 2017-09-11 LAB — COMPREHENSIVE METABOLIC PANEL
ALBUMIN: 4.2 g/dL (ref 3.5–5.2)
ALT: 20 U/L (ref 0–53)
AST: 18 U/L (ref 0–37)
Alkaline Phosphatase: 65 U/L (ref 39–117)
BUN: 14 mg/dL (ref 6–23)
CHLORIDE: 104 meq/L (ref 96–112)
CO2: 25 mEq/L (ref 19–32)
CREATININE: 1 mg/dL (ref 0.40–1.50)
Calcium: 9.1 mg/dL (ref 8.4–10.5)
GFR: 80.4 mL/min (ref >60.00)
GLUCOSE: 151 mg/dL — AB (ref 70–99)
Potassium: 4.2 mEq/L (ref 3.5–5.1)
Sodium: 138 mEq/L (ref 135–145)
Total Bilirubin: 0.8 mg/dL (ref 0.2–1.2)
Total Protein: 6.6 g/dL (ref 6.0–8.3)

## 2017-09-11 LAB — HEMOGLOBIN A1C: HEMOGLOBIN A1C: 7.4 % — AB (ref 4.6–6.5)

## 2017-09-11 MED ORDER — SITAGLIPTIN PHOSPHATE 100 MG PO TABS: 100.0000 mg | ORAL_TABLET | Freq: Every day | ORAL | 3 refills | Status: DC

## 2017-09-11 NOTE — Progress Notes (Signed)
Office Note 09/11/2017  CC: No chief complaint on file.  CPE  HPI:  Mark Blankenship is a 62 y.o. White male who is here for annual health maintenance exam.  Eye exam: about 2 yrs ago. Dental: past due. Exercise: none right now. Diet: "I need to work on it".  Appetite up lately, portion size up lately.  Past Medical History:  Diagnosis Date  . Arthritis   . Ascending aorta dilatation (HCC)    a. Ascending aorta mildly dilated by echo 09/03/15.  . Bladder cancer [facility]) 2012   Mass removed, then BCG by urologist (Dr. Alinda Money).  Recurrence documented on cysto + bladder washings 10/14/15--getting more maintenance BCG.  Cysto 01/20/16 OK, then more BCG given.  Cysto 04/13/16 c/w just having finished BCG but cytology/FISH ok.  Cysto 07/27/2016 c/w small area of BCG related inflamm vs recurrent carcinoma in situ--washings for cytology done, BCG stopped due to severe sx's.  . Bladder cancer ([place])    11/02/16 cysto showed 1.5 cm raised erythematous lesion suspicious for recurrence--cystoscopic resection was done and path was benign 10/2016.  Surveillance cystoscopy 6 mo is urol plan  . BPH (benign prostatic hyperplasia)    with hx of acute prostatitis  . CAD (coronary artery disease)    a. '05 MI > DES to circumflex. b. NSTEMI 08/2015 with total occlusion of the mid LAD along with 20% mid-distal Cx, 100% dCX, 40% D1, 50% pCx, 50% m-dRCA, 80% dRCA.Given >12 hours out from symptom onset, medical therapy recommended.  . Complication of anesthesia    pt awakens aggitated and aggressive   . Diabetes mellitus type 2, controlled ([place])    HbA1c 02/2011 was 6.6%  . Elevated PSA 09/2011   with prostate nodule.  Prostate bx ALL BENIGN 10/10/11 (Dr. Christella Hartigan followed by [facility], next one planned for 05/2017.  Marland Kitchen GERD (gastroesophageal reflux disease)    occasional  . Headache(784.0)    cluster headaches a long time ago  . History of bronchitis   . History of vertigo   . HTN (hypertension)   .  Hyperlipidemia   . Ischemic cardiomyopathy    a. 2D Echo 09/03/15: mild LVH, EF 40-45%, diffuse HK, akinesis of apical myocardium, grade 1 DD, aortic root dimention 35mm, ascending aorta mildly dilated, mildly dilated RV/RA.   Marland Kitchen Myocardial infarction [facility]) 2005; 08/2015   followed by Dr. Stanford Breed  . Thrombocytopenia ([place])    Stable 2012-2017  . Tobacco abuse   . Urinary tract infection   . Wolf-Parkinson-White syndrome     Past Surgical History:  Procedure Laterality Date  . BLADDER TUMOR EXCISION  11/08/10; 05/2015   urothelial carcinoma (Dr. Alinda Money).  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  Marland Kitchen CARDIAC CATHETERIZATION  2005   with stent placement  . CARDIAC CATHETERIZATION N/A 09/01/2015   Procedure: Left Heart Cath and Coronary Angiography;  Surgeon: Belva Crome, MD;  Location: [place] [facility];  Service: Cardiovascular;  Laterality: N/A;  . COLONOSCOPY  2010   Dr. Isa Rankin, repeat 2020.  Patient wants to go to different GI when time for repeat.  . CYSTOSCOPY W/ RETROGRADES Bilateral 06/22/2015   Procedure: CYSTOSCOPY WITH RETROGRADE PYELOGRAM;  Surgeon: Raynelle Bring, MD;  Location: [facility];  Service: Urology;  Laterality: Bilateral;  . CYSTOSCOPY W/ RETROGRADES N/A 11/17/2016   Procedure: CYSTOSCOPY WITH RETROGRADE PYELOGRAM;  Surgeon: Raynelle Bring, MD;  Location: [facility];  Service: Urology;  Laterality: N/A;  . CYSTOSCOPY WITH RETROGRADE PYELOGRAM, URETEROSCOPY  AND STENT PLACEMENT N/A 06/06/2013   Procedure: CYSTOSCOPY WITH RETROGRADE PYELOGRAM;  Surgeon: Dutch Gray, MD;  Location: [facility];  Service: Urology;  Laterality: N/A;  . lexiscan  2012   Normal/negative  . [place] in [place] ([place])-no trouble since last surgery  . myocardial perfusion study  2009   Neg ischemia, EF 52%  . TRANSURETHRAL RESECTION OF BLADDER TUMOR N/A 06/06/2013   Procedure: TRANSURETHRAL RESECTION OF BLADDER TUMOR (TURBT);   Surgeon: Dutch Gray, MD;  Location: [facility];  Service: Urology;  Laterality: N/A;.  PATH: squamous metaplasia, fibrosis with chronic inflammation, no malignancy.  . TRANSURETHRAL RESECTION OF BLADDER TUMOR N/A 06/22/2015   Carcinoma in situ: no invasive malignancy.  Procedure: TRANSURETHRAL RESECTION OF BLADDER TUMOR (TURBT);  Surgeon: Raynelle Bring, MD;  Location: [facility];  Service: Urology;  Laterality: N/A;  **BLADDER BIOPSY*RESECTION**  . TRANSURETHRAL RESECTION OF BLADDER TUMOR N/A 11/17/2016   Path: BENIGN UROTHELIUM WITH INFLAMMATION--no malignancy. Procedure: TRANSURETHRAL RESECTION OF BLADDER TUMOR (TURBT);  Surgeon: Raynelle Bring, MD;  Location: [facility];  Service: Urology;  Laterality: N/A;  . VASECTOMY      Family History  Problem Relation Age of Onset  . Hypertension Mother   . Cancer Father        colon and prostate, mets to lungs.  . Hypertension Father   . Heart disease Brother        d. 63 MI  . Multiple sclerosis Brother   . Diabetes Brother   . Heart disease Paternal Uncle     Social History   Socioeconomic History  . Marital status: Married    Spouse name: Not on file  . Number of children: Not on file  . Years of education: Not on file  . Highest education level: Not on file  Social Needs  . Financial resource strain: Not on file  . Food insecurity - worry: Not on file  . Food insecurity - inability: Not on file  . Transportation needs - medical: Not on file  . Transportation needs - non-medical: Not on file  Occupational History  . Not on file  Tobacco Use  . Smoking status: Former Smoker    Packs/day: 0.25    Years: 40.00    Pack years: 10.00    Types: Cigarettes    Last attempt to quit: 09/05/2015    Years since quitting: 2.0  . Smokeless tobacco: Never Used  Substance and Sexual Activity  . Alcohol use: No    Alcohol/week: 0.0 oz  . Drug use: No  . Sexual activity: Not on file  Other Topics Concern  . Not on file  Social History Narrative    Married, 2 children, 4 grandchildren--[place].   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   Tob: 30 pack-yr hx, quit 08/2011.   No alcohol or drug use.   Active on farm but no formal exercise regimen.    Outpatient Medications Prior to Visit  Medication Sig Dispense Refill  . acetaminophen (TYLENOL) 500 MG tablet Take 1,000 mg by mouth daily as needed for mild pain or headache.     Marland Kitchen aspirin EC 81 MG tablet Take 81 mg by mouth daily.    Marland Kitchen atenolol (TENORMIN) 50 MG tablet TAKE ONE TABLET BY MOUTH EVERY DAY WITH LUNCH 90 tablet 1  . atorvastatin (LIPITOR) 80 MG  tablet TAKE ONE TABLET BY MOUTH EVERY EVENING 90 tablet 3  . calcium carbonate (TUMS - DOSED IN MG ELEMENTAL CALCIUM) 500 MG chewable tablet Chew 1 tablet by mouth daily as needed for indigestion or heartburn.    . fluticasone (FLONASE) 50 MCG/ACT nasal spray Place 1 spray into both nostrils daily as needed for allergies. 16 g 3  . GLIPIZIDE XL 10 MG 24 hr tablet TAKE ONE TABLET BY MOUTH EVERY DAY WITH LUNCH 90 tablet 1  . HYDROcodone-acetaminophen (NORCO/VICODIN) 5-325 MG tablet Take 1-2 tablets by mouth every 6 (six) hours as needed for moderate pain. 30 tablet 0  . lisinopril (PRINIVIL,ZESTRIL) 20 MG tablet TAKE ONE TABLET BY MOUTH EVERY DAY WITH LUNCH 90 tablet 1  . metFORMIN (GLUCOPHAGE) 1000 MG tablet TAKE ONE TABLET BY MOUTH TWICE DAILY WITH A MEAL (Patient taking differently: Take 1,000 mg by mouth 2 (two) times daily with a meal. TAKE ONE TABLET BY MOUTH TWICE DAILY WITH A MEAL) 180 tablet 1  . nitroGLYCERIN (NITROSTAT) 0.4 MG SL tablet Place 1 tablet (0.4 mg total) under the tongue every 5 (five) minutes as needed for chest pain (up to 3 doses). 25 tablet 3  . oxybutynin (DITROPAN) 5 MG tablet Take 5 mg by mouth daily as needed for bladder spasms.     . phenazopyridine (PYRIDIUM) 100 MG tablet Take 1 tablet (100 mg  total) by mouth 3 (three) times daily as needed for pain (for burning). 20 tablet 0  . tamsulosin (FLOMAX) 0.4 MG CAPS capsule TAKE 1 CAPSULE BY MOUTH EVERY DAY AFTER SUPPER 90 capsule 1   No facility-administered medications prior to visit.     No Known Allergies  ROS Review of Systems  Constitutional: Negative for appetite change, chills, fatigue and fever.  HENT: Negative for congestion, dental problem, ear pain and sore throat.   Eyes: Negative for discharge, redness and visual disturbance.  Respiratory: Negative for cough, chest tightness, shortness of breath and wheezing.   Cardiovascular: Negative for chest pain, palpitations and leg swelling.  Gastrointestinal: Negative for abdominal pain, blood in stool, diarrhea, nausea and vomiting.  Genitourinary: Negative for difficulty urinating, dysuria, flank pain, frequency, hematuria and urgency.  Musculoskeletal: Negative for arthralgias, back pain, joint swelling, myalgias and neck stiffness.  Skin: Negative for pallor and rash.  Neurological: Negative for dizziness, speech difficulty, weakness and headaches.  Hematological: Negative for adenopathy. Does not bruise/bleed easily.  Psychiatric/Behavioral: Negative for confusion and sleep disturbance. The patient is not nervous/anxious.     PE; Blood pressure 110/70, pulse 70, temperature 98.1 F (36.7 C), temperature source Oral, height 5\' 7"  (1.702 m), weight 184 lb 1.9 oz (83.5 kg). Gen: Alert, well appearing.  Patient is oriented to person, place, time, and situation. AFFECT: pleasant, lucid thought and speech. ENT: Ears: EACs clear, normal epithelium.  TMs with good light reflex and landmarks bilaterally.  Eyes: no injection, icteris, swelling, or exudate.  EOMI, PERRLA. Nose: no drainage or turbinate edema/swelling.  No injection or focal lesion.  Mouth: lips without lesion/swelling.  Oral mucosa pink and moist.  Dentition intact and without obvious caries or gingival swelling.   Oropharynx without erythema, exudate, or swelling.  Neck: supple/nontender.  No LAD, mass, or TM.  Carotid pulses 2+ bilaterally, without bruits. CV: RRR, no m/r/g.   LUNGS: CTA bilat, nonlabored resps, good aeration in all lung fields. ABD: soft, NT, ND, BS normal.  No hepatospenomegaly or mass.  No bruits. EXT: no clubbing, cyanosis, or edema.  Musculoskeletal: no  joint swelling, erythema, warmth, or tenderness.  ROM of all joints intact. Skin - no sores or suspicious lesions or rashes or color changes   Pertinent labs:  Lab Results  Component Value Date   TSH 0.96 08/26/2015   Lab Results  Component Value Date   WBC 7.6 06/05/2017   HGB 15.7 06/05/2017   HCT 45.7 06/05/2017   MCV 91.8 06/05/2017   PLT 115.0 (L) 06/05/2017   Lab Results  Component Value Date   CREATININE 1.08 06/05/2017   BUN 14 06/05/2017   NA 140 06/05/2017   K 4.4 06/05/2017   CL 105 06/05/2017   CO2 28 06/05/2017   Lab Results  Component Value Date   ALT 24 09/06/2016   AST 21 09/06/2016   ALKPHOS 64 09/06/2016   BILITOT 0.8 09/06/2016   Lab Results  Component Value Date   CHOL 86 06/05/2017   Lab Results  Component Value Date   HDL 26.70 (L) 06/05/2017   Lab Results  Component Value Date   LDLCALC 41 06/05/2017   Lab Results  Component Value Date   TRIG 92.0 06/05/2017   Lab Results  Component Value Date   CHOLHDL 3 06/05/2017   Lab Results  Component Value Date   PSA 2.25 01/13/2016   Lab Results  Component Value Date   HGBA1C 7.3 (H) 06/05/2017    ASSESSMENT AND PLAN:   Health maintenance exam: Reviewed age and gender appropriate health maintenance issues (prudent diet, regular exercise, health risks of tobacco and excessive alcohol, use of seatbelts, fire alarms in home, use of sunscreen).  Also reviewed age and gender appropriate health screening as well as vaccine recommendations. Vaccines: Pneumovax, Tdap, Flu all UTD.  Discussed shingrix--he'll check into this with  insurer. Labs: CMET, HbA1c. Prostate ca screening: hx of elevated PSA, followed by urol. Colon ca screening: next colonoscopy due 2020.  He is due for diab retpthy eye exam.  Pt states that if HbA1c is not improved any today then he is ok with adding Januvia like we had discussed in the past.  An After Visit Summary was printed and given to the patient.  FOLLOW UP:  Return in about 3 months (around 12/09/2017) for routine chronic illness f/u.  Signed:  Crissie Sickles, MD           09/11/2017

## 2017-09-11 NOTE — Patient Instructions (Signed)

## 2017-09-12 ENCOUNTER — Other Ambulatory Visit: Payer: Self-pay | Admitting: Family Medicine

## 2017-09-12 ENCOUNTER — Telehealth: Payer: Self-pay | Admitting: Cardiology

## 2017-09-12 ENCOUNTER — Encounter: Payer: Self-pay | Admitting: *Deleted

## 2017-09-12 NOTE — Telephone Encounter (Signed)
New message    Spouse calling for patient  Pt c/o medication issue:  1. Name of Medication: Januvia  2. How are you currently taking this medication (dosage and times per day)? N/A  3. Are you having a reaction (difficulty breathing--STAT)? NO  4. What is your medication issue? Is medication safe to take with other meds

## 2017-09-12 NOTE — Telephone Encounter (Signed)
Left message to call back  

## 2017-09-12 NOTE — Telephone Encounter (Signed)
Should be fine with his medications.  There is some indication that mixing it with his lisinopril can increase risk for angioedema, but all the incidences were with a different DPP4 inhibitor (not Januvia).

## 2017-09-13 NOTE — Telephone Encounter (Signed)
Pt returning call concerning his medication.

## 2017-09-13 NOTE — Telephone Encounter (Signed)
Returned call to wife Shirlean Mylar. Explained Kristin's recommendations.

## 2017-10-02 ENCOUNTER — Other Ambulatory Visit: Payer: Self-pay | Admitting: Family Medicine

## 2017-11-02 ENCOUNTER — Telehealth: Payer: Self-pay | Admitting: *Deleted

## 2017-11-02 MED ORDER — SITAGLIPTIN PHOSPHATE 100 MG PO TABS: 100.0000 mg | ORAL_TABLET | Freq: Every day | ORAL | 0 refills | Status: DC

## 2017-11-02 NOTE — Telephone Encounter (Signed)
PA sent via covermymed on 11/02/17   Key: [id number]   Medication: Januvia 100mg  tablet   Dx: DM- E11.9   Per Dr. Anitra Lauth pt has tried and failed tradjenta?, currently taking metformin and glipizide.   Waiting for response.

## 2017-11-02 NOTE — Telephone Encounter (Signed)
PA was d/c after finding out from the pharmacy that pts insurance will only cover 90 day supply through retail pharmacy's.   I will send in new Rx for 90 day supply.

## 2017-11-06 ENCOUNTER — Other Ambulatory Visit: Payer: Self-pay | Admitting: Family Medicine

## 2017-12-11 ENCOUNTER — Ambulatory Visit: Payer: BLUE CROSS/BLUE SHIELD | Admitting: Family Medicine

## 2017-12-14 ENCOUNTER — Encounter: Payer: Self-pay | Admitting: Family Medicine

## 2017-12-14 ENCOUNTER — Other Ambulatory Visit: Payer: Self-pay

## 2017-12-14 ENCOUNTER — Ambulatory Visit: Payer: BLUE CROSS/BLUE SHIELD | Admitting: Family Medicine

## 2017-12-14 VITALS — BP 97/54 | HR 57 | Temp 97.8°F | Resp 16 | Ht 67.0 in | Wt 184.4 lb

## 2017-12-14 DIAGNOSIS — I10 Essential (primary) hypertension: Secondary | ICD-10-CM | POA: Diagnosis not present

## 2017-12-14 DIAGNOSIS — E7800 Pure hypercholesterolemia, unspecified: Secondary | ICD-10-CM | POA: Diagnosis not present

## 2017-12-14 DIAGNOSIS — E663 Overweight: Secondary | ICD-10-CM

## 2017-12-14 DIAGNOSIS — E119 Type 2 diabetes mellitus without complications: Secondary | ICD-10-CM

## 2017-12-14 LAB — BASIC METABOLIC PANEL
BUN: 14 mg/dL (ref 6–23)
CALCIUM: 9.6 mg/dL (ref 8.4–10.5)
CHLORIDE: 104 meq/L (ref 96–112)
CO2: 28 meq/L (ref 19–32)
Creatinine, Ser: 1.11 mg/dL (ref 0.40–1.50)
GFR: 71.22 mL/min (ref >60.00)
GLUCOSE: 102 mg/dL — AB (ref 70–99)
Potassium: 4.1 mEq/L (ref 3.5–5.1)
SODIUM: 139 meq/L (ref 135–145)

## 2017-12-14 LAB — HEMOGLOBIN A1C: Hgb A1c MFr Bld: 6.8 % — ABNORMAL HIGH (ref 4.6–6.5)

## 2017-12-14 MED ORDER — GLIPIZIDE ER 10 MG PO TB24: ORAL_TABLET | ORAL | 1 refills | Status: DC

## 2017-12-14 MED ORDER — LISINOPRIL 20 MG PO TABS: ORAL_TABLET | ORAL | 1 refills | Status: DC

## 2017-12-14 MED ORDER — LISINOPRIL 10 MG PO TABS: 10.0000 mg | ORAL_TABLET | Freq: Every day | ORAL | 1 refills | Status: DC

## 2017-12-14 NOTE — Progress Notes (Signed)
Patient called stating that they wanted Lisinopril 10mg  instead of 20mg .  He did not want to cut medication in half. New prescription for Lisinopril 10 mg sent to pharmacy.

## 2017-12-14 NOTE — Patient Instructions (Signed)
Start taking your glipizide with breakfast instead of at lunchtime. Decrease you lisinopril 20 mg tab to ONE HALF tab every day. Continue to monitor home blood pressure and heart rate.

## 2017-12-14 NOTE — Progress Notes (Signed)
OFFICE VISIT  12/14/2017   CC:  Chief Complaint  Patient presents with  . Follow-up    RCI, pt is fasting.    HPI:    Patient is a 62 y.o. Caucasian male who presents for 3 mo f/u DM 2, HTN, HLD.  Home glucoses: fasting 100-115.  Evenings 50s-60s after he gets home from work.  Feels hypoglycemic--tired and shaky, has to eat something.  Eats small measl-4 or so per day.  HTN: Home BPs: usually 110/70.  No dizziness.  HLD: compliant with atorva 80mg  qd.  ROS: no CP, no SOB/nausea/diaphoresis w/activity.  No myalgias.  No HAs, no palpitations, no LE swelling.  Past Medical History:  Diagnosis Date  . Arthritis   . Ascending aorta dilatation (HCC)    a. Ascending aorta mildly dilated by echo 09/03/15.  . Bladder cancer [facility]) 2012   Mass removed, then BCG by urologist (Dr. Alinda Money).  Recurrence documented on cysto + bladder washings 10/14/15--getting more maintenance BCG.  Cysto 01/20/16 OK, then more BCG given.  Cysto 04/13/16 c/w just having finished BCG but cytology/FISH ok.  Cysto 07/27/2016 c/w small area of BCG related inflamm vs recurrent carcinoma in situ--washings for cytology done, BCG stopped due to severe sx's.  . Bladder cancer ([place])    11/02/16 cysto showed 1.5 cm raised erythematous lesion suspicious for recurrence--cystoscopic resection was done and path was benign 10/2016.  Surveillance cystoscopy 6 mo is urol plan  . BPH (benign prostatic hyperplasia)    with hx of acute prostatitis  . CAD (coronary artery disease)    a. '05 MI > DES to circumflex. b. NSTEMI 08/2015 with total occlusion of the mid LAD along with 20% mid-distal Cx, 100% dCX, 40% D1, 50% pCx, 50% m-dRCA, 80% dRCA.Given >12 hours out from symptom onset, medical therapy recommended.  . Complication of anesthesia    pt awakens aggitated and aggressive   . Diabetes mellitus type 2, controlled ([place])    HbA1c 02/2011 was 6.6%  . Elevated PSA 09/2011   with prostate nodule.  Prostate bx ALL BENIGN 10/10/11 (Dr.  Christella Hartigan followed by Burman Freestone, next one planned for 05/2017.  Marland Kitchen GERD (gastroesophageal reflux disease)    occasional  . Headache(784.0)    cluster headaches a long time ago  . History of bronchitis   . History of vertigo   . HTN (hypertension)   . Hyperlipidemia   . Ischemic cardiomyopathy    a. 2D Echo 09/03/15: mild LVH, EF 40-45%, diffuse HK, akinesis of apical myocardium, grade 1 DD, aortic root dimention 54mm, ascending aorta mildly dilated, mildly dilated RV/RA.   Marland Kitchen Myocardial infarction [facility]) 2005; 08/2015   followed by Dr. Stanford Breed  . Thrombocytopenia ([place])    Stable 2012-2017  . Tobacco abuse   . Urinary tract infection   . Wolf-Parkinson-White syndrome     Past Surgical History:  Procedure Laterality Date  . BLADDER TUMOR EXCISION  11/08/10; 05/2015   urothelial carcinoma (Dr. Alinda Money).  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  Marland Kitchen CARDIAC CATHETERIZATION  2005   with stent placement  . CARDIAC CATHETERIZATION N/A 09/01/2015   Procedure: Left Heart Cath and Coronary Angiography;  Surgeon: Belva Crome, MD;  Location: [place] [facility];  Service: Cardiovascular;  Laterality: N/A;  . COLONOSCOPY  2010   Dr. Isa Rankin, repeat 2020.  Patient wants to go to different GI when time for repeat.  . CYSTOSCOPY W/ RETROGRADES Bilateral 06/22/2015   Procedure: CYSTOSCOPY WITH RETROGRADE  PYELOGRAM;  Surgeon: Raynelle Bring, MD;  [facility];  Service: Urology;  Laterality: Bilateral;  . CYSTOSCOPY W/ RETROGRADES N/A 11/17/2016   Procedure: CYSTOSCOPY WITH RETROGRADE PYELOGRAM;  Surgeon: Raynelle Bring, MD;  Location[facility];  Service: Urology;  Laterality: N/A;  . CYSTOSCOPY WITH RETROGRADE PYELOGRAM, URETEROSCOPY AND STENT PLACEMENT N/A 06/06/2013   Procedure: CYSTOSCOPY WITH RETROGRADE PYELOGRAM;  Surgeon: Dutch Gray, MD;  [facility];  Service: Urology;  Laterality: N/A;  . lexiscan  2012   Normal/negative  . [place] in [place] ([place])-no trouble since last surgery  . myocardial perfusion study  2009   Neg ischemia, EF 52%  . TRANSURETHRAL RESECTION OF BLADDER TUMOR N/A 06/06/2013   Procedure: TRANSURETHRAL RESECTION OF BLADDER TUMOR (TURBT);  Surgeon: Dutch Gray, MD;  [facility];  Service: Urology;  Laterality: N/A;.  PATH: squamous metaplasia, fibrosis with chronic inflammation, no malignancy.  . TRANSURETHRAL RESECTION OF BLADDER TUMOR N/A 06/22/2015   Carcinoma in situ: no invasive malignancy.  Procedure: TRANSURETHRAL RESECTION OF BLADDER TUMOR (TURBT);  Surgeon: Raynelle Bring, MD;  [facility];  Service: Urology;  Laterality: N/A;  **BLADDER BIOPSY*RESECTION**  . TRANSURETHRAL RESECTION OF BLADDER TUMOR N/A 11/17/2016   Path: BENIGN UROTHELIUM WITH INFLAMMATION--no malignancy. Procedure: TRANSURETHRAL RESECTION OF BLADDER TUMOR (TURBT);  Surgeon: Raynelle Bring, MD;  [facility];  Service: Urology;  Laterality: N/A;  . VASECTOMY      Outpatient Medications Prior to Visit  Medication Sig Dispense Refill  . acetaminophen (TYLENOL) 500 MG tablet Take 1,000 mg by mouth daily as needed for mild pain or headache.     Marland Kitchen aspirin EC 81 MG tablet Take 81 mg by mouth daily.    Marland Kitchen atenolol (TENORMIN) 50 MG tablet TAKE ONE TABLET BY MOUTH EVERY DAY WITH LUNCH 90 tablet 1  . atorvastatin (LIPITOR) 80 MG tablet TAKE ONE TABLET BY MOUTH EVERY EVENING 90 tablet 3  . calcium carbonate (TUMS - DOSED IN MG ELEMENTAL CALCIUM) 500 MG chewable tablet Chew 1 tablet by mouth daily as needed for indigestion or heartburn.    . fluticasone (FLONASE) 50 MCG/ACT nasal spray Place 1 spray into both nostrils daily as needed for allergies. 16 g 3  . HYDROcodone-acetaminophen (NORCO/VICODIN) 5-325 MG tablet Take 1-2 tablets by mouth every 6 (six) hours as needed for moderate pain. 30 tablet 0  . metFORMIN (GLUCOPHAGE) 1000 MG tablet TAKE ONE TABLET BY MOUTH TWICE DAILY WITH A MEAL 180 tablet 1  .  nitroGLYCERIN (NITROSTAT) 0.4 MG SL tablet Place 1 tablet (0.4 mg total) under the tongue every 5 (five) minutes as needed for chest pain (up to 3 doses). 25 tablet 3  . oxybutynin (DITROPAN) 5 MG tablet Take 5 mg by mouth daily as needed for bladder spasms.     . phenazopyridine (PYRIDIUM) 100 MG tablet Take 1 tablet (100 mg total) by mouth 3 (three) times daily as needed for pain (for burning). 20 tablet 0  . sitaGLIPtin (JANUVIA) 100 MG tablet Take 1 tablet (100 mg total) by mouth daily. 90 tablet 0  . tamsulosin (FLOMAX) 0.4 MG CAPS capsule TAKE 1 CAPSULE BY MOUTH EVERY DAY AFTER SUPPER 90 capsule 1  . GLIPIZIDE XL 10 MG 24 hr tablet TAKE ONE TABLET BY MOUTH EVERY DAY WITH LUNCH 90 tablet 1  . lisinopril (PRINIVIL,ZESTRIL) 20 MG tablet TAKE ONE TABLET BY MOUTH EVERY DAY WITH LUNCH 90 tablet 1   No facility-administered medications prior  to visit.     No Known Allergies  ROS As per HPI  PE: Blood pressure (!) 97/54, pulse (!) 57, temperature 97.8 F (36.6 C), temperature source Oral, resp. rate 16, height 5\' 7"  (1.702 m), weight 184 lb 6 oz (83.6 kg), SpO2 97 %. Body mass index is 28.88 kg/m.  Gen: Alert, well appearing.  Patient is oriented to person, place, time, and situation. AFFECT: pleasant, lucid thought and speech. CV: RRR, no m/r/g.   LUNGS: CTA bilat, nonlabored resps, good aeration in all lung fields. EXT: no clubbing, cyanosis, or edema.    LABS:  Lab Results  Component Value Date   TSH 0.96 08/26/2015   Lab Results  Component Value Date   WBC 7.6 06/05/2017   HGB 15.7 06/05/2017   HCT 45.7 06/05/2017   MCV 91.8 06/05/2017   PLT 115.0 (L) 06/05/2017   Lab Results  Component Value Date   CREATININE 1.00 09/11/2017   BUN 14 09/11/2017   NA 138 09/11/2017   K 4.2 09/11/2017   CL 104 09/11/2017   CO2 25 09/11/2017   Lab Results  Component Value Date   ALT 20 09/11/2017   AST 18 09/11/2017   ALKPHOS 65 09/11/2017   BILITOT 0.8 09/11/2017   Lab  Results  Component Value Date   CHOL 86 06/05/2017   Lab Results  Component Value Date   HDL 26.70 (L) 06/05/2017   Lab Results  Component Value Date   LDLCALC 41 06/05/2017   Lab Results  Component Value Date   TRIG 92.0 06/05/2017   Lab Results  Component Value Date   CHOLHDL 3 06/05/2017   Lab Results  Component Value Date   HGBA1C 7.4 (H) 09/11/2017     IMPRESSION AND PLAN:  1) DM 2; good control except some evening readings being too low. Change timing of glipizide xl to BF instead of lunch---to try to avoid his hypoglycemia in evenings. HbA1c. BMET.  2) HTN: low-to-low normal bp.  Although he is asymptomatic, I will decrease lisinopril to 10mg  qd (1/2 of 20 mg tab qd). Continue to monitor home bp and HR. BMET today.  3) HLD: tolerating statin. Cholesterol panel excellent 05/2017.  Plan repeat 05/2018.  An After Visit Summary was printed and given to the patient.  FOLLOW UP: Return in about 3 months (around 03/16/2018).  Signed:  Crissie Sickles, MD           12/14/2017

## 2017-12-15 ENCOUNTER — Encounter: Payer: Self-pay | Admitting: *Deleted

## 2017-12-19 ENCOUNTER — Encounter: Payer: Self-pay | Admitting: Family Medicine

## 2017-12-19 NOTE — Telephone Encounter (Signed)
Please advise. Thanks.  

## 2017-12-20 ENCOUNTER — Encounter: Payer: Self-pay | Admitting: Family Medicine

## 2017-12-20 NOTE — Telephone Encounter (Signed)
Letter printed.

## 2018-01-11 NOTE — Progress Notes (Signed)
HPI: FU coronary disease status post drug-eluting stent to the circumflex in August 2005. Patient admitted 2/17 with myocardial infarction. Cardiac catheterization February 2017 showed an occluded distal circumflex. The mid to distal LAD was occluded as well. Ejection fraction 40-50%. Patient was treated medically because of late presentation. Echocardiogram February 2017 showed ejection fraction 40-45% with akinesis of the apical myocardium. Mild right atrial and right ventricular enlargement. Since I last saw him, the patient denies any dyspnea on exertion, orthopnea, PND, pedal edema, palpitations, syncope or chest pain.   Current Outpatient Medications  Medication Sig Dispense Refill  . acetaminophen (TYLENOL) 500 MG tablet Take 1,000 mg by mouth daily as needed for mild pain or headache.     Marland Kitchen aspirin EC 81 MG tablet Take 81 mg by mouth daily.    Marland Kitchen atenolol (TENORMIN) 50 MG tablet TAKE ONE TABLET BY MOUTH EVERY DAY WITH LUNCH 90 tablet 1  . atorvastatin (LIPITOR) 80 MG tablet TAKE ONE TABLET BY MOUTH EVERY EVENING 90 tablet 3  . calcium carbonate (TUMS - DOSED IN MG ELEMENTAL CALCIUM) 500 MG chewable tablet Chew 1 tablet by mouth daily as needed for indigestion or heartburn.    . fluticasone (FLONASE) 50 MCG/ACT nasal spray Place 1 spray into both nostrils daily as needed for allergies. 16 g 3  . glipiZIDE (GLIPIZIDE XL) 10 MG 24 hr tablet 1 tab po q Breakfast 90 tablet 1  . HYDROcodone-acetaminophen (NORCO/VICODIN) 5-325 MG tablet Take 1-2 tablets by mouth every 6 (six) hours as needed for moderate pain. 30 tablet 0  . lisinopril (PRINIVIL,ZESTRIL) 10 MG tablet Take 1 tablet (10 mg total) by mouth daily. Please cancel Lisinopril 20mg  Rx 90 tablet 1  . metFORMIN (GLUCOPHAGE) 1000 MG tablet TAKE ONE TABLET BY MOUTH TWICE DAILY WITH A MEAL 180 tablet 1  . nitroGLYCERIN (NITROSTAT) 0.4 MG SL tablet Place 1 tablet (0.4 mg total) under the tongue every 5 (five) minutes as needed for chest  pain (up to 3 doses). 25 tablet 3  . oxybutynin (DITROPAN) 5 MG tablet Take 5 mg by mouth daily as needed for bladder spasms.     . phenazopyridine (PYRIDIUM) 100 MG tablet Take 1 tablet (100 mg total) by mouth 3 (three) times daily as needed for pain (for burning). 20 tablet 0  . sitaGLIPtin (JANUVIA) 100 MG tablet Take 1 tablet (100 mg total) by mouth daily. 90 tablet 0  . tamsulosin (FLOMAX) 0.4 MG CAPS capsule TAKE 1 CAPSULE BY MOUTH EVERY DAY AFTER SUPPER 90 capsule 1   No current facility-administered medications for this visit.      Past Medical History:  Diagnosis Date  . Arthritis   . Ascending aorta dilatation (HCC)    a. Ascending aorta mildly dilated by echo 09/03/15.  . Bladder cancer [facility]) 2012   Mass removed, then BCG by urologist (Dr. Alinda Money).  Recurrence documented on cysto + bladder washings 10/14/15--getting more maintenance BCG.  Cysto 01/20/16 OK, then more BCG given.  Cysto 04/13/16 c/w just having finished BCG but cytology/FISH ok.  Cysto 07/27/2016 c/w small area of BCG related inflamm vs recurrent carcinoma in situ--washings for cytology done, BCG stopped due to severe sx's.  . Bladder cancer ([place])    11/02/16 cysto showed 1.5 cm raised erythematous lesion suspicious for recurrence--cystoscopic resection was done and path was benign 10/2016.  Surveillance cystoscopy 6 mo is urol plan  . BPH (benign prostatic hyperplasia)    with hx of acute prostatitis  .  CAD (coronary artery disease)    a. '05 MI > DES to circumflex. b. NSTEMI 08/2015 with total occlusion of the mid LAD along with 20% mid-distal Cx, 100% dCX, 40% D1, 50% pCx, 50% m-dRCA, 80% dRCA.Given >12 hours out from symptom onset, medical therapy recommended.  . Complication of anesthesia    pt awakens aggitated and aggressive   . Diabetes mellitus type 2, controlled ([place])    HbA1c 02/2011 was 6.6%  . Elevated PSA 09/2011   with prostate nodule.  Prostate bx ALL BENIGN 10/10/11 (Dr. Christella Hartigan followed by Burman Freestone, next  one planned for 05/2017.  Marland Kitchen GERD (gastroesophageal reflux disease)    occasional  . Headache(784.0)    cluster headaches a long time ago  . History of bronchitis   . History of vertigo   . HTN (hypertension)   . Hyperlipidemia   . Ischemic cardiomyopathy    a. 2D Echo 09/03/15: mild LVH, EF 40-45%, diffuse HK, akinesis of apical myocardium, grade 1 DD, aortic root dimention 30mm, ascending aorta mildly dilated, mildly dilated RV/RA.   Marland Kitchen Myocardial infarction [facility]) 2005; 08/2015   followed by Dr. Stanford Breed  . Thrombocytopenia ([place])    Stable 2012-2017  . Tobacco abuse   . Urinary tract infection   . Wolf-Parkinson-White syndrome     Past Surgical History:  Procedure Laterality Date  . BLADDER TUMOR EXCISION  11/08/10; 05/2015   urothelial carcinoma (Dr. Alinda Money).  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  Marland Kitchen CARDIAC CATHETERIZATION  2005   with stent placement  . CARDIAC CATHETERIZATION N/A 09/01/2015   Procedure: Left Heart Cath and Coronary Angiography;  Surgeon: Belva Crome, MD;  Location: [place] [facility];  Service: Cardiovascular;  Laterality: N/A;  . COLONOSCOPY  2010   Dr. Isa Rankin, repeat 2020.  Patient wants to go to different GI when time for repeat.  . CYSTOSCOPY W/ RETROGRADES Bilateral 06/22/2015   Procedure: CYSTOSCOPY WITH RETROGRADE PYELOGRAM;  Surgeon: Raynelle Bring, MD;  [facility];  Service: Urology;  Laterality: Bilateral;  . CYSTOSCOPY W/ RETROGRADES N/A 11/17/2016   Procedure: CYSTOSCOPY WITH RETROGRADE PYELOGRAM;  Surgeon: Raynelle Bring, MD;  [facility];  Service: Urology;  Laterality: N/A;  . CYSTOSCOPY WITH RETROGRADE PYELOGRAM, URETEROSCOPY AND STENT PLACEMENT N/A 06/06/2013   Procedure: CYSTOSCOPY WITH RETROGRADE PYELOGRAM;  Surgeon: Dutch Gray, MD;  [facility];  Service: Urology;  Laterality: N/A;  . lexiscan  2012   Normal/negative  . [place] in [place]  ([place])-no trouble since last surgery  . myocardial perfusion study  2009   Neg ischemia, EF 52%  . TRANSURETHRAL RESECTION OF BLADDER TUMOR N/A 06/06/2013   Procedure: TRANSURETHRAL RESECTION OF BLADDER TUMOR (TURBT);  Surgeon: Dutch Gray, MD;  [facility];  Service: Urology;  Laterality: N/A;.  PATH: squamous metaplasia, fibrosis with chronic inflammation, no malignancy.  . TRANSURETHRAL RESECTION OF BLADDER TUMOR N/A 06/22/2015   Carcinoma in situ: no invasive malignancy.  Procedure: TRANSURETHRAL RESECTION OF BLADDER TUMOR (TURBT);  Surgeon: Raynelle Bring, MD;  [facility];  Service: Urology;  Laterality: N/A;  **BLADDER BIOPSY*RESECTION**  . TRANSURETHRAL RESECTION OF BLADDER TUMOR N/A 11/17/2016   Path: BENIGN UROTHELIUM WITH INFLAMMATION--no malignancy. Procedure: TRANSURETHRAL RESECTION OF BLADDER TUMOR (TURBT);  Surgeon: Raynelle Bring, MD;  [facility];  Service: Urology;  Laterality: N/A;  . VASECTOMY      Social History   Socioeconomic History  . Marital status:  Married    Spouse name: Not on file  . Number of children: Not on file  . Years of education: Not on file  . Highest education level: Not on file  Occupational History  . Not on file  Social Needs  . Financial resource strain: Not on file  . Food insecurity:    Worry: Not on file    Inability: Not on file  . Transportation needs:    Medical: Not on file    Non-medical: Not on file  Tobacco Use  . Smoking status: Former Smoker    Packs/day: 0.25    Years: 40.00    Pack years: 10.00    Types: Cigarettes    Last attempt to quit: 09/05/2015    Years since quitting: 2.3  . Smokeless tobacco: Never Used  Substance and Sexual Activity  . Alcohol use: No    Alcohol/week: 0.0 oz  . Drug use: No  . Sexual activity: Not on file  Lifestyle  . Physical activity:    Days per week: Not on file    Minutes per session: Not on file  . Stress: Not on file  Relationships  . Social connections:    Talks  on phone: Not on file    Gets together: Not on file    Attends religious service: Not on file    Active member of club or organization: Not on file    Attends meetings of clubs or organizations: Not on file    Relationship status: Not on file  . Intimate partner violence:    Fear of current or ex partner: Not on file    Emotionally abused: Not on file    Physically abused: Not on file    Forced sexual activity: Not on file  Other Topics Concern  . Not on file  Social History Narrative   Married, 2 children, 4 grandchildren--[place].   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   Tob: 30 pack-yr hx, quit 08/2011.   No alcohol or drug use.   Active on farm but no formal exercise regimen.    Family History  Problem Relation Age of Onset  . Hypertension Mother   . Cancer Father        colon and prostate, mets to lungs.  . Hypertension Father   . Heart disease Brother        d. 60 MI  . Multiple sclerosis Brother   . Diabetes Brother   . Heart disease Paternal Uncle     ROS: no fevers or chills, productive cough, hemoptysis, dysphasia, odynophagia, melena, hematochezia, dysuria, hematuria, rash, seizure activity, orthopnea, PND, pedal edema, claudication. Remaining systems are negative.  Physical Exam: Well-developed well-nourished in no acute distress.  Skin is warm and dry.  HEENT is normal.  Neck is supple.  Chest is clear to auscultation with normal expansion.  Cardiovascular exam is regular rate and rhythm.  Abdominal exam nontender or distended. No masses palpated. Extremities show no edema. neuro grossly intact   A/P  1 coronary artery disease-patient continues to do well with no chest pain.  Continue medical therapy with aspirin and statin.  2 hypertension-blood pressure is controlled.  Continue present medications.  Potassium and renal function  monitored by primary care.  Most recent laboratories from Dec 14, 2017 personally reviewed.  BUN 14 and creatinine 1.1.  Potassium 4.1.  3 hyperlipidemia-continue  statin.  Most recent liver functions from September 11, 2017 personally reviewed.  Normal.  Lipids 06/05/2017 showed total cholesterol 86 with LDL 41.  4 ischemic cardiomyopathy-continue ACE inhibitor and beta-blocker.  No symptoms of congestive heart failure.  5 diabetes mellitus-managed by primary care.  Kirk Ruths, MD

## 2018-01-22 ENCOUNTER — Ambulatory Visit: Payer: BLUE CROSS/BLUE SHIELD | Admitting: Cardiology

## 2018-01-22 ENCOUNTER — Encounter: Payer: Self-pay | Admitting: Cardiology

## 2018-01-22 VITALS — BP 124/77 | HR 64 | Ht 67.0 in | Wt 182.2 lb

## 2018-01-22 DIAGNOSIS — E7800 Pure hypercholesterolemia, unspecified: Secondary | ICD-10-CM | POA: Diagnosis not present

## 2018-01-22 DIAGNOSIS — I10 Essential (primary) hypertension: Secondary | ICD-10-CM | POA: Diagnosis not present

## 2018-01-22 DIAGNOSIS — I2510 Atherosclerotic heart disease of native coronary artery without angina pectoris: Secondary | ICD-10-CM | POA: Diagnosis not present

## 2018-01-22 NOTE — Patient Instructions (Signed)
Your physician wants you to follow-up in: ONE YEAR WITH DR CRENSHAW You will receive a reminder letter in the mail two months in advance. If you don't receive a letter, please call our office to schedule the follow-up appointment.   If you need a refill on your cardiac medications before your next appointment, please call your pharmacy.  

## 2018-01-31 DIAGNOSIS — N401 Enlarged prostate with lower urinary tract symptoms: Secondary | ICD-10-CM | POA: Diagnosis not present

## 2018-01-31 DIAGNOSIS — R3914 Feeling of incomplete bladder emptying: Secondary | ICD-10-CM | POA: Diagnosis not present

## 2018-01-31 DIAGNOSIS — C678 Malignant neoplasm of overlapping sites of bladder: Secondary | ICD-10-CM | POA: Diagnosis not present

## 2018-02-01 ENCOUNTER — Encounter: Payer: Self-pay | Admitting: Family Medicine

## 2018-02-06 ENCOUNTER — Other Ambulatory Visit: Payer: Self-pay | Admitting: Family Medicine

## 2018-02-17 ENCOUNTER — Other Ambulatory Visit: Payer: Self-pay | Admitting: Family Medicine

## 2018-02-19 ENCOUNTER — Encounter: Payer: Self-pay | Admitting: Family Medicine

## 2018-03-06 ENCOUNTER — Other Ambulatory Visit: Payer: Self-pay | Admitting: Family Medicine

## 2018-03-08 ENCOUNTER — Encounter: Payer: Self-pay | Admitting: Family Medicine

## 2018-03-14 ENCOUNTER — Ambulatory Visit: Payer: BLUE CROSS/BLUE SHIELD | Admitting: Family Medicine

## 2018-03-27 ENCOUNTER — Ambulatory Visit: Payer: BLUE CROSS/BLUE SHIELD | Admitting: Family Medicine

## 2018-03-28 ENCOUNTER — Ambulatory Visit: Payer: BLUE CROSS/BLUE SHIELD | Admitting: Family Medicine

## 2018-03-28 ENCOUNTER — Encounter: Payer: Self-pay | Admitting: Family Medicine

## 2018-03-28 VITALS — BP 104/63 | HR 55 | Temp 97.5°F | Resp 16 | Ht 67.0 in | Wt 181.0 lb

## 2018-03-28 DIAGNOSIS — C801 Malignant (primary) neoplasm, unspecified: Secondary | ICD-10-CM | POA: Insufficient documentation

## 2018-03-28 DIAGNOSIS — Z23 Encounter for immunization: Secondary | ICD-10-CM

## 2018-03-28 DIAGNOSIS — I10 Essential (primary) hypertension: Secondary | ICD-10-CM | POA: Diagnosis not present

## 2018-03-28 DIAGNOSIS — E118 Type 2 diabetes mellitus with unspecified complications: Secondary | ICD-10-CM | POA: Diagnosis not present

## 2018-03-28 DIAGNOSIS — E7800 Pure hypercholesterolemia, unspecified: Secondary | ICD-10-CM

## 2018-03-28 LAB — POCT GLYCOSYLATED HEMOGLOBIN (HGB A1C): Hemoglobin A1C: 6.1 % — AB (ref 4.0–5.6)

## 2018-03-28 NOTE — Progress Notes (Signed)
OFFICE VISIT  03/28/2018   CC:  Chief Complaint  Patient presents with  . Follow-up    RCI, pt is fasting.     HPI:    Patient is a 62 y.o. Caucasian male who presents for 3 mo f/u DM 2, HTN, HLD.  DM 2: glucs around 120s or better consistently per pt.  HTN: consistently <130/80.  Pt denies any bp's <110/60 at home.    HLD: tolerating statin. 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  He tries to eat fairly healthy.  ROS: no CP, no SOB, no wheezing, no cough, no dizziness, no HAs, no rashes, no melena/hematochezia.  No polyuria or polydipsia.  No myalgias or arthralgias.  Past Medical History:  Diagnosis Date  . Arthritis   . Ascending aorta dilatation (HCC)    a. Ascending aorta mildly dilated by echo 09/03/15.  . Bladder cancer [facility]) 2012   Mass removed, then BCG by urologist (Dr. Alinda Money).  Recurrence documented on cysto + bladder washings 10/14/15--getting more maintenance BCG.  Cysto 01/20/16 OK, then more BCG given.  Cysto 04/13/16 c/w just having finished BCG but cytology/FISH ok.  Cysto 07/27/2016 c/w small area of BCG related inflamm vs recurrent carcinoma in situ--washings for cytology done, BCG stopped due to severe sx's.  . Bladder cancer ([place])    11/02/16 cysto showed 1.5 cm raised erythematous lesion suspicious for recurrence--cystoscopic resection was done and path was benign 10/2016.  Subsequent cystoscopies unchanged, most recent 01/31/18.  Marland Kitchen BPH (benign prostatic hyperplasia)    with hx of acute prostatitis  . CAD (coronary artery disease)    a. '05 MI > DES to circumflex. b. NSTEMI 08/2015 with total occlusion of the mid LAD along with 20% mid-distal Cx, 100% dCX, 40% D1, 50% pCx, 50% m-dRCA, 80% dRCA.Given >12 hours out from symptom onset, medical therapy recommended.  . Complication of anesthesia    pt awakens aggitated and aggressive   . Diabetes mellitus type 2, controlled ([place])    HbA1c 02/2011 was 6.6%  . Elevated PSA 09/2011   with prostate nodule.  Prostate bx  ALL BENIGN 10/10/11 (Dr. Christella Hartigan followed by Urol--improved to 1.18 Jun 2017.  Nodule NOT palpable anymore on DRE at [facility] 01/2018 office f/u.  Marland Kitchen GERD (gastroesophageal reflux disease)    occasional  . Headache(784.0)    cluster headaches a long time ago  . History of bronchitis   . History of vertigo   . HTN (hypertension)   . Hyperlipidemia   . Ischemic cardiomyopathy    a. 2D Echo 09/03/15: mild LVH, EF 40-45%, diffuse HK, akinesis of apical myocardium, grade 1 DD, aortic root dimention 44mm, ascending aorta mildly dilated, mildly dilated RV/RA.   Marland Kitchen Myocardial infarction [facility]) 2005; 08/2015   followed by Dr. Stanford Breed  . Thrombocytopenia ([place])    Stable 2012-2017  . Tobacco abuse   . Urinary tract infection   . Wolf-Parkinson-White syndrome     Past Surgical History:  Procedure Laterality Date  . BLADDER TUMOR EXCISION  11/08/10; 05/2015   urothelial carcinoma (Dr. Alinda Money).  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  Marland Kitchen CARDIAC CATHETERIZATION  2005   with stent placement  . CARDIAC CATHETERIZATION N/A 09/01/2015   Procedure: Left Heart Cath and Coronary Angiography;  Surgeon: Belva Crome, MD;  Location: [place] CV LAB;  Service: Cardiovascular;  Laterality: N/A;  . COLONOSCOPY  2010   Dr. Isa Rankin, repeat 2020.  Patient wants to go to different  GI when time for repeat.  . CYSTOSCOPY W/ RETROGRADES Bilateral 06/22/2015   Procedure: CYSTOSCOPY WITH RETROGRADE PYELOGRAM;  Surgeon: Raynelle Bring, MD;  [facility];  Service: Urology;  Laterality: Bilateral;  . CYSTOSCOPY W/ RETROGRADES N/A 11/17/2016   Procedure: CYSTOSCOPY WITH RETROGRADE PYELOGRAM;  Surgeon: Raynelle Bring, MD;  [facility];  Service: Urology;  Laterality: N/A;  . CYSTOSCOPY WITH RETROGRADE PYELOGRAM, URETEROSCOPY AND STENT PLACEMENT N/A 06/06/2013   Procedure: CYSTOSCOPY WITH RETROGRADE PYELOGRAM;  Surgeon: Dutch Gray, MD;  [facility];  Service: Urology;   Laterality: N/A;  . lexiscan  2012   Normal/negative  . [place] in [place] ([place])-no trouble since last surgery  . myocardial perfusion study  2009   Neg ischemia, EF 52%  . TRANSURETHRAL RESECTION OF BLADDER TUMOR N/A 06/06/2013   Procedure: TRANSURETHRAL RESECTION OF BLADDER TUMOR (TURBT);  Surgeon: Dutch Gray, MD;  [facility];  Service: Urology;  Laterality: N/A;.  PATH: squamous metaplasia, fibrosis with chronic inflammation, no malignancy.  . TRANSURETHRAL RESECTION OF BLADDER TUMOR N/A 06/22/2015   Carcinoma in situ: no invasive malignancy.  Procedure: TRANSURETHRAL RESECTION OF BLADDER TUMOR (TURBT);  Surgeon: Raynelle Bring, MD;  [facility];  Service: Urology;  Laterality: N/A;  **BLADDER BIOPSY*RESECTION**  . TRANSURETHRAL RESECTION OF BLADDER TUMOR N/A 11/17/2016   Path: BENIGN UROTHELIUM WITH INFLAMMATION--no malignancy. Procedure: TRANSURETHRAL RESECTION OF BLADDER TUMOR (TURBT);  Surgeon: Raynelle Bring, MD;  [facility];  Service: Urology;  Laterality: N/A;  . VASECTOMY      Outpatient Medications Prior to Visit  Medication Sig Dispense Refill  . acetaminophen (TYLENOL) 500 MG tablet Take 1,000 mg by mouth daily as needed for mild pain or headache.     Marland Kitchen aspirin EC 81 MG tablet Take 81 mg by mouth daily.    Marland Kitchen atenolol (TENORMIN) 50 MG tablet TAKE ONE TABLET BY MOUTH EVERY DAY WITH LUNCH 90 tablet 1  . atorvastatin (LIPITOR) 80 MG tablet TAKE ONE TABLET BY MOUTH EVERY EVENING 90 tablet 3  . calcium carbonate (TUMS - DOSED IN MG ELEMENTAL CALCIUM) 500 MG chewable tablet Chew 1 tablet by mouth daily as needed for indigestion or heartburn.    . fluticasone (FLONASE) 50 MCG/ACT nasal spray Place 1 spray into both nostrils daily as needed for allergies. 16 g 3  . glipiZIDE (GLIPIZIDE XL) 10 MG 24 hr tablet Take 1 tablet (10 mg total) by mouth daily with breakfast. 90 tablet 1  . HYDROcodone-acetaminophen (NORCO/VICODIN) 5-325 MG  tablet Take 1-2 tablets by mouth every 6 (six) hours as needed for moderate pain. 30 tablet 0  . JANUVIA 100 MG tablet TAKE ONE TABLET BY MOUTH EVERY DAY 90 tablet 1  . lisinopril (PRINIVIL,ZESTRIL) 10 MG tablet Take 1 tablet (10 mg total) by mouth daily. Please cancel Lisinopril 20mg  Rx 90 tablet 1  . metFORMIN (GLUCOPHAGE) 1000 MG tablet TAKE ONE TABLET BY MOUTH TWICE DAILY WITH MEALS 180 tablet 1  . nitroGLYCERIN (NITROSTAT) 0.4 MG SL tablet Place 1 tablet (0.4 mg total) under the tongue every 5 (five) minutes as needed for chest pain (up to 3 doses). 25 tablet 3  . oxybutynin (DITROPAN) 5 MG tablet Take 5 mg by mouth daily as needed for bladder spasms.     . phenazopyridine (PYRIDIUM) 100 MG tablet Take 1 tablet (100 mg total) by mouth 3 (three) times daily as needed for pain (for burning). 20 tablet 0  . tamsulosin (FLOMAX)  0.4 MG CAPS capsule TAKE 1 CAPSULE BY MOUTH EVERY DAY AFTER SUPPER 90 capsule 1  . glipiZIDE (GLIPIZIDE XL) 10 MG 24 hr tablet 1 tab po q Breakfast (Patient not taking: Reported on 03/28/2018) 90 tablet 1   No facility-administered medications prior to visit.     No Known Allergies  ROS As per HPI  PE: Blood pressure 104/63, pulse (!) 55, temperature (!) 97.5 F (36.4 C), temperature source Oral, resp. rate 16, height 5\' 7"  (1.702 m), weight 181 lb (82.1 kg), SpO2 96 %. Gen: Alert, well appearing.  Patient is oriented to person, place, time, and situation. AFFECT: pleasant, lucid thought and speech. CV: RRR, no m/r/g.   LUNGS: CTA bilat, nonlabored resps, good aeration in all lung fields. EXT: no clubbing or cyanosis.  no edema.    LABS:  Lab Results  Component Value Date   TSH 0.96 08/26/2015   Lab Results  Component Value Date   WBC 7.6 06/05/2017   HGB 15.7 06/05/2017   HCT 45.7 06/05/2017   MCV 91.8 06/05/2017   PLT 115.0 (L) 06/05/2017   Lab Results  Component Value Date   CREATININE 1.11 12/14/2017   BUN 14 12/14/2017   NA 139 12/14/2017   K  4.1 12/14/2017   CL 104 12/14/2017   CO2 28 12/14/2017   Lab Results  Component Value Date   ALT 20 09/11/2017   AST 18 09/11/2017   ALKPHOS 65 09/11/2017   BILITOT 0.8 09/11/2017   Lab Results  Component Value Date   CHOL 86 06/05/2017   Lab Results  Component Value Date   HDL 26.70 (L) 06/05/2017   Lab Results  Component Value Date   LDLCALC 41 06/05/2017   Lab Results  Component Value Date   TRIG 92.0 06/05/2017   Lab Results  Component Value Date   CHOLHDL 3 06/05/2017   Lab Results  Component Value Date   PSA 1.25 06/21/2017   PSA 2.25 01/13/2016   Lab Results  Component Value Date   HGBA1C 6.1 (A) 03/28/2018   POC HbA1c today: 6.1%  IMPRESSION AND PLAN:  1) DM 2 Overdue for eye exam-->he'll set up appt with eye MD later this year. HbA1c today: 6.1%.    2) HTN: The current medical regimen is effective;  continue present plan and medications. Lytes/cr have been consistently stable, most recent 11/2017.  3) HLD: tolerating statin.  FLP excellent 05/2017. Plan repeat at next f/u visit.  An After Visit Summary was printed and given to the patient.  FOLLOW UP: Return in about 3 months (around 06/27/2018) for routine chronic illness f/u.  Signed:  Crissie Sickles, MD           03/28/2018

## 2018-05-02 ENCOUNTER — Other Ambulatory Visit: Payer: Self-pay

## 2018-05-02 MED ORDER — TAMSULOSIN HCL 0.4 MG PO CAPS: ORAL_CAPSULE | ORAL | 1 refills | Status: DC

## 2018-05-02 MED ORDER — ATENOLOL 50 MG PO TABS: ORAL_TABLET | ORAL | 1 refills | Status: DC

## 2018-06-05 ENCOUNTER — Other Ambulatory Visit: Payer: Self-pay

## 2018-06-05 MED ORDER — LISINOPRIL 10 MG PO TABS: 10.0000 mg | ORAL_TABLET | Freq: Every day | ORAL | 1 refills | Status: DC

## 2018-06-18 ENCOUNTER — Other Ambulatory Visit: Payer: Self-pay | Admitting: Cardiology

## 2018-06-28 ENCOUNTER — Ambulatory Visit: Payer: BLUE CROSS/BLUE SHIELD | Admitting: Family Medicine

## 2018-06-28 ENCOUNTER — Encounter: Payer: Self-pay | Admitting: Family Medicine

## 2018-06-28 VITALS — BP 137/77 | HR 84 | Resp 16 | Ht 67.0 in | Wt 186.0 lb

## 2018-06-28 DIAGNOSIS — E7800 Pure hypercholesterolemia, unspecified: Secondary | ICD-10-CM | POA: Diagnosis not present

## 2018-06-28 DIAGNOSIS — E119 Type 2 diabetes mellitus without complications: Secondary | ICD-10-CM

## 2018-06-28 DIAGNOSIS — I10 Essential (primary) hypertension: Secondary | ICD-10-CM

## 2018-06-28 NOTE — Progress Notes (Signed)
OFFICE VISIT  06/28/2018   CC:  Chief Complaint  Patient presents with  . Follow-up    rci     HPI:    Patient is a 62 y.o. Caucasian male who presents for 3 mo f/u DM 2, HTN, HLD.  DM:  Glucoses up some since last visit, the highest since 250.  Avg approx 160-170.  Dietary indiscretion lately + working less b/c knees hurting. Feet: left foot big toes with much decreased sensation since getting back surgery in remote past.  HTN; only occasional check at home: always <130/80.  HLD: tolerating high dose atorva  ROS: no CP, no SOB, no wheezing, no cough, no dizziness, no HAs, no rashes, no melena/hematochezia.  No polyuria or polydipsia.  No myalgias.  Chronic bilat knee pains, hx of osteoarthritis, considering going back to ortho to discuss possible TKA.    Past Medical History:  Diagnosis Date  . Arthritis   . Ascending aorta dilatation (HCC)    a. Ascending aorta mildly dilated by echo 09/03/15.  . Bladder cancer [facility]) 2012   Mass removed, then BCG by urologist (Dr. Alinda Money).  Recurrence documented on cysto + bladder washings 10/14/15--getting more maintenance BCG.  Cysto 01/20/16 OK, then more BCG given.  Cysto 04/13/16 c/w just having finished BCG but cytology/FISH ok.  Cysto 07/27/2016 c/w small area of BCG related inflamm vs recurrent carcinoma in situ--washings for cytology done, BCG stopped due to severe sx's.  . Bladder cancer ([place])    11/02/16 cysto showed 1.5 cm raised erythematous lesion suspicious for recurrence--cystoscopic resection was done and path was benign 10/2016.  Subsequent cystoscopies unchanged, most recent 01/31/18.  Marland Kitchen BPH (benign prostatic hyperplasia)    with hx of acute prostatitis  . CAD (coronary artery disease)    a. '05 MI > DES to circumflex. b. NSTEMI 08/2015 with total occlusion of the mid LAD along with 20% mid-distal Cx, 100% dCX, 40% D1, 50% pCx, 50% m-dRCA, 80% dRCA.Given >12 hours out from symptom onset, medical therapy recommended.  . Complication  of anesthesia    pt awakens aggitated and aggressive   . Diabetes mellitus type 2, controlled ([place])    HbA1c 02/2011 was 6.6%  . Elevated PSA 09/2011   with prostate nodule.  Prostate bx ALL BENIGN 10/10/11 (Dr. Christella Hartigan followed by Urol--improved to 1.18 Jun 2017.  Nodule NOT palpable anymore on DRE at [facility] 01/2018 office f/u.  Marland Kitchen GERD (gastroesophageal reflux disease)    occasional  . Headache(784.0)    cluster headaches a long time ago  . History of bronchitis   . History of vertigo   . HTN (hypertension)   . Hyperlipidemia   . Ischemic cardiomyopathy    a. 2D Echo 09/03/15: mild LVH, EF 40-45%, diffuse HK, akinesis of apical myocardium, grade 1 DD, aortic root dimention 74mm, ascending aorta mildly dilated, mildly dilated RV/RA.   Marland Kitchen Myocardial infarction [facility] 2005; 08/2015   followed by Dr. Stanford Breed  . Thrombocytopenia ([place])    Stable 2012-2017  . Tobacco abuse   . Urinary tract infection   . Wolf-Parkinson-White syndrome     Past Surgical History:  Procedure Laterality Date  . BLADDER TUMOR EXCISION  11/08/10; 05/2015   urothelial carcinoma (Dr. Alinda Money).  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  Marland Kitchen CARDIAC CATHETERIZATION  2005   with stent placement  . CARDIAC CATHETERIZATION N/A 09/01/2015   Procedure: Left Heart Cath and Coronary Angiography;  Surgeon: Belva Crome, MD;  Location:  [place] INVASIVE CV LAB;  Service: Cardiovascular;  Laterality: N/A;  . COLONOSCOPY  2010   Dr. Isa Rankin, repeat 2020.  Patient wants to go to different GI when time for repeat.  . CYSTOSCOPY W/ RETROGRADES Bilateral 06/22/2015   Procedure: CYSTOSCOPY WITH RETROGRADE PYELOGRAM;  Surgeon: Raynelle Bring, MD;  [facility];  Service: Urology;  Laterality: Bilateral;  . CYSTOSCOPY W/ RETROGRADES N/A 11/17/2016   Procedure: CYSTOSCOPY WITH RETROGRADE PYELOGRAM;  Surgeon: Raynelle Bring, MD;  [facility];  Service: Urology;  Laterality: N/A;  . CYSTOSCOPY  WITH RETROGRADE PYELOGRAM, URETEROSCOPY AND STENT PLACEMENT N/A 06/06/2013   Procedure: CYSTOSCOPY WITH RETROGRADE PYELOGRAM;  Surgeon: Dutch Gray, MD;  [facility];  Service: Urology;  Laterality: N/A;  . lexiscan  2012   Normal/negative  . [place] in [place] ([place])-no trouble since last surgery  . myocardial perfusion study  2009   Neg ischemia, EF 52%  . TRANSURETHRAL RESECTION OF BLADDER TUMOR N/A 06/06/2013   Procedure: TRANSURETHRAL RESECTION OF BLADDER TUMOR (TURBT);  Surgeon: Dutch Gray, MD;  [facility];  Service: Urology;  Laterality: N/A;.  PATH: squamous metaplasia, fibrosis with chronic inflammation, no malignancy.  . TRANSURETHRAL RESECTION OF BLADDER TUMOR N/A 06/22/2015   Carcinoma in situ: no invasive malignancy.  Procedure: TRANSURETHRAL RESECTION OF BLADDER TUMOR (TURBT);  Surgeon: Raynelle Bring, MD;  [facility];  Service: Urology;  Laterality: N/A;  **BLADDER BIOPSY*RESECTION**  . TRANSURETHRAL RESECTION OF BLADDER TUMOR N/A 11/17/2016   Path: BENIGN UROTHELIUM WITH INFLAMMATION--no malignancy. Procedure: TRANSURETHRAL RESECTION OF BLADDER TUMOR (TURBT);  Surgeon: Raynelle Bring, MD;  [facility];  Service: Urology;  Laterality: N/A;  . VASECTOMY      Outpatient Medications Prior to Visit  Medication Sig Dispense Refill  . acetaminophen (TYLENOL) 500 MG tablet Take 1,000 mg by mouth daily as needed for mild pain or headache.     Marland Kitchen aspirin EC 81 MG tablet Take 81 mg by mouth daily.    Marland Kitchen atenolol (TENORMIN) 50 MG tablet TAKE ONE TABLET BY MOUTH EVERY DAY WITH LUNCH 90 tablet 1  . atorvastatin (LIPITOR) 80 MG tablet TAKE ONE TABLET BY MOUTH EVERY EVENING 90 tablet 3  . calcium carbonate (TUMS - DOSED IN MG ELEMENTAL CALCIUM) 500 MG chewable tablet Chew 1 tablet by mouth daily as needed for indigestion or heartburn.    . fluticasone (FLONASE) 50 MCG/ACT nasal spray Place 1 spray into both nostrils daily as needed for  allergies. 16 g 3  . glipiZIDE (GLIPIZIDE XL) 10 MG 24 hr tablet Take 1 tablet (10 mg total) by mouth daily with breakfast. 90 tablet 1  . HYDROcodone-acetaminophen (NORCO/VICODIN) 5-325 MG tablet Take 1-2 tablets by mouth every 6 (six) hours as needed for moderate pain. 30 tablet 0  . JANUVIA 100 MG tablet TAKE ONE TABLET BY MOUTH EVERY DAY 90 tablet 1  . lisinopril (PRINIVIL,ZESTRIL) 10 MG tablet Take 1 tablet (10 mg total) by mouth daily. Please cancel Lisinopril 20mg  Rx 90 tablet 1  . metFORMIN (GLUCOPHAGE) 1000 MG tablet TAKE ONE TABLET BY MOUTH TWICE DAILY WITH MEALS 180 tablet 1  . nitroGLYCERIN (NITROSTAT) 0.4 MG SL tablet Place 1 tablet (0.4 mg total) under the tongue every 5 (five) minutes as needed for chest pain (up to 3 doses). 25 tablet 3  . oxybutynin (DITROPAN) 5 MG tablet Take 5 mg by mouth daily as needed for bladder spasms.     . phenazopyridine (PYRIDIUM)  100 MG tablet Take 1 tablet (100 mg total) by mouth 3 (three) times daily as needed for pain (for burning). 20 tablet 0  . tamsulosin (FLOMAX) 0.4 MG CAPS capsule TAKE 1 CAPSULE BY MOUTH EVERY DAY AFTER SUPPER 90 capsule 1  . glipiZIDE (GLIPIZIDE XL) 10 MG 24 hr tablet 1 tab po q Breakfast 90 tablet 1   No facility-administered medications prior to visit.     No Known Allergies  ROS As per HPI  PE: Blood pressure 137/77, pulse 84, resp. rate 16, height 5\' 7"  (1.702 m), weight 186 lb (84.4 kg), SpO2 96 %. Gen: Alert, well appearing.  Patient is oriented to person, place, time, and situation. AFFECT: pleasant, lucid thought and speech. CV: RRR, no m/r/g.   LUNGS: CTA bilat, nonlabored resps, good aeration in all lung fields.  Foot exam - bilateral normal; no swelling, tenderness or skin or vascular lesions. Color and temperature is normal. Sensation is intact. Peripheral pulses are palpable. Toenails are normal.   LABS:  Lab Results  Component Value Date   TSH 0.96 08/26/2015   Lab Results  Component Value Date    WBC 7.6 06/05/2017   HGB 15.7 06/05/2017   HCT 45.7 06/05/2017   MCV 91.8 06/05/2017   PLT 115.0 (L) 06/05/2017   Lab Results  Component Value Date   CREATININE 1.11 12/14/2017   BUN 14 12/14/2017   NA 139 12/14/2017   K 4.1 12/14/2017   CL 104 12/14/2017   CO2 28 12/14/2017   Lab Results  Component Value Date   ALT 20 09/11/2017   AST 18 09/11/2017   ALKPHOS 65 09/11/2017   BILITOT 0.8 09/11/2017   Lab Results  Component Value Date   CHOL 86 06/05/2017   Lab Results  Component Value Date   HDL 26.70 (L) 06/05/2017   Lab Results  Component Value Date   LDLCALC 41 06/05/2017   Lab Results  Component Value Date   TRIG 92.0 06/05/2017   Lab Results  Component Value Date   CHOLHDL 3 06/05/2017   Lab Results  Component Value Date   PSA 1.25 06/21/2017   PSA 2.25 01/13/2016   Lab Results  Component Value Date   HGBA1C 6.1 (A) 03/28/2018    IMPRESSION AND PLAN:  1) DM 2: control not as good lately. Feet exam normal. Pt not fasting. A1c, BMET, FLP--future. Pt reminded that he is due for diab retpthy screening.  2) HTN: The current medical regimen is effective;  continue present plan and medications. Return tomorrow morning for fasting BMET.  3) HLD: tolerating 80mg  atorva qd. FLP tomorrow morning.  An After Visit Summary was printed and given to the patient.  FOLLOW UP: Return in about 3 months (around 09/27/2018) for annual CPE (fasting).  Signed:  Crissie Sickles, MD           06/28/2018

## 2018-06-29 ENCOUNTER — Other Ambulatory Visit (INDEPENDENT_AMBULATORY_CARE_PROVIDER_SITE_OTHER): Payer: BLUE CROSS/BLUE SHIELD

## 2018-06-29 DIAGNOSIS — E119 Type 2 diabetes mellitus without complications: Secondary | ICD-10-CM | POA: Diagnosis not present

## 2018-06-29 DIAGNOSIS — I10 Essential (primary) hypertension: Secondary | ICD-10-CM

## 2018-06-29 DIAGNOSIS — E7800 Pure hypercholesterolemia, unspecified: Secondary | ICD-10-CM | POA: Diagnosis not present

## 2018-06-29 LAB — LIPID PANEL
Cholesterol: 90 mg/dL (ref 0–200)
HDL: 25 mg/dL — ABNORMAL LOW (ref >39.00)
LDL Cholesterol: 42 mg/dL (ref 0–99)
NonHDL: 65.17
Total CHOL/HDL Ratio: 4
Triglycerides: 117 mg/dL (ref 0.0–149.0)
VLDL: 23.4 mg/dL (ref 0.0–40.0)

## 2018-06-29 LAB — BASIC METABOLIC PANEL
BUN: 18 mg/dL (ref 6–23)
CO2: 24 mEq/L (ref 19–32)
Calcium: 9.1 mg/dL (ref 8.4–10.5)
Chloride: 109 mEq/L (ref 96–112)
Creatinine, Ser: 1 mg/dL (ref 0.40–1.50)
GFR: 80.2 mL/min (ref >60.00)
Glucose, Bld: 142 mg/dL — ABNORMAL HIGH (ref 70–99)
Potassium: 4.1 mEq/L (ref 3.5–5.1)
Sodium: 141 mEq/L (ref 135–145)

## 2018-06-29 LAB — HEMOGLOBIN A1C: HEMOGLOBIN A1C: 6.9 % — AB (ref 4.6–6.5)

## 2018-08-06 ENCOUNTER — Other Ambulatory Visit: Payer: Self-pay | Admitting: Family Medicine

## 2018-08-08 DIAGNOSIS — R3914 Feeling of incomplete bladder emptying: Secondary | ICD-10-CM | POA: Diagnosis not present

## 2018-08-08 DIAGNOSIS — N401 Enlarged prostate with lower urinary tract symptoms: Secondary | ICD-10-CM | POA: Diagnosis not present

## 2018-08-08 LAB — LAB REPORT - SCANNED: PSA, TOTAL: 3.53

## 2018-08-15 DIAGNOSIS — C678 Malignant neoplasm of overlapping sites of bladder: Secondary | ICD-10-CM | POA: Diagnosis not present

## 2018-08-15 DIAGNOSIS — R972 Elevated prostate specific antigen [PSA]: Secondary | ICD-10-CM | POA: Diagnosis not present

## 2018-08-17 ENCOUNTER — Encounter: Payer: Self-pay | Admitting: Family Medicine

## 2018-08-19 ENCOUNTER — Encounter: Payer: Self-pay | Admitting: Family Medicine

## 2018-08-31 ENCOUNTER — Telehealth: Payer: Self-pay | Admitting: *Deleted

## 2018-08-31 MED ORDER — GLIPIZIDE ER 10 MG PO TB24: 10.0000 mg | ORAL_TABLET | Freq: Every day | ORAL | 1 refills | Status: DC

## 2018-08-31 NOTE — Telephone Encounter (Signed)
Rx sent 

## 2018-09-27 ENCOUNTER — Encounter: Payer: BLUE CROSS/BLUE SHIELD | Admitting: Family Medicine

## 2018-10-03 ENCOUNTER — Other Ambulatory Visit: Payer: Self-pay | Admitting: Family Medicine

## 2018-10-04 ENCOUNTER — Encounter: Payer: BLUE CROSS/BLUE SHIELD | Admitting: Family Medicine

## 2018-10-04 ENCOUNTER — Other Ambulatory Visit: Payer: Self-pay

## 2018-10-04 DIAGNOSIS — E119 Type 2 diabetes mellitus without complications: Secondary | ICD-10-CM

## 2018-10-04 DIAGNOSIS — I10 Essential (primary) hypertension: Secondary | ICD-10-CM

## 2018-10-04 DIAGNOSIS — Z0000 Encounter for general adult medical examination without abnormal findings: Secondary | ICD-10-CM

## 2018-10-04 DIAGNOSIS — E7800 Pure hypercholesterolemia, unspecified: Secondary | ICD-10-CM

## 2018-10-04 LAB — COMPREHENSIVE METABOLIC PANEL
ALT: 21 U/L (ref 0–53)
AST: 20 U/L (ref 0–37)
Albumin: 4.5 g/dL (ref 3.5–5.2)
Alkaline Phosphatase: 65 U/L (ref 39–117)
BUN: 19 mg/dL (ref 6–23)
CHLORIDE: 104 meq/L (ref 96–112)
CO2: 24 mEq/L (ref 19–32)
Calcium: 9.6 mg/dL (ref 8.4–10.5)
Creatinine, Ser: 1.15 mg/dL (ref 0.40–1.50)
GFR: 64.16 mL/min (ref >60.00)
GLUCOSE: 126 mg/dL — AB (ref 70–99)
Potassium: 4.5 mEq/L (ref 3.5–5.1)
SODIUM: 137 meq/L (ref 135–145)
Total Bilirubin: 0.7 mg/dL (ref 0.2–1.2)
Total Protein: 6.7 g/dL (ref 6.0–8.3)

## 2018-10-04 LAB — LIPID PANEL
Cholesterol: 88 mg/dL (ref 0–200)
HDL: 27.3 mg/dL — ABNORMAL LOW (ref >39.00)
LDL Cholesterol: 43 mg/dL (ref 0–99)
NONHDL: 60.27
Total CHOL/HDL Ratio: 3
Triglycerides: 88 mg/dL (ref 0.0–149.0)
VLDL: 17.6 mg/dL (ref 0.0–40.0)

## 2018-10-04 LAB — HEMOGLOBIN A1C: Hgb A1c MFr Bld: 7 % — ABNORMAL HIGH (ref 4.6–6.5)

## 2018-10-08 ENCOUNTER — Telehealth: Payer: Self-pay

## 2018-10-08 NOTE — Telephone Encounter (Signed)
-----   Message from Tammi Sou, MD sent at 10/05/2018  5:57 PM EDT ----- All labs stable.-thx

## 2018-10-31 ENCOUNTER — Other Ambulatory Visit: Payer: Self-pay | Admitting: Family Medicine

## 2018-10-31 MED ORDER — ATENOLOL 50 MG PO TABS: ORAL_TABLET | ORAL | 1 refills | Status: DC

## 2018-12-05 ENCOUNTER — Other Ambulatory Visit: Payer: Self-pay | Admitting: Family Medicine

## 2019-01-30 ENCOUNTER — Other Ambulatory Visit: Payer: Self-pay | Admitting: Family Medicine

## 2019-02-24 ENCOUNTER — Other Ambulatory Visit: Payer: Self-pay | Admitting: Family Medicine

## 2019-02-27 ENCOUNTER — Other Ambulatory Visit: Payer: Self-pay | Admitting: Family Medicine

## 2019-03-05 DIAGNOSIS — R972 Elevated prostate specific antigen [PSA]: Secondary | ICD-10-CM | POA: Diagnosis not present

## 2019-03-05 LAB — PSA: PSA: 2.42

## 2019-03-05 IMAGING — DX DG HIP (WITH OR WITHOUT PELVIS) 2-3V*R*
3 series · 3 of 3 positions shown · non-contrast
Comparison: Coronal and sagittal CT images through the pelvis of
August 23, 2010

CLINICAL DATA: Onset of right hip pain 4 days ago. No history of
injury.

EXAM:
DG HIP (WITH OR WITHOUT PELVIS) 2-3V RIGHT

[pelvis ap]
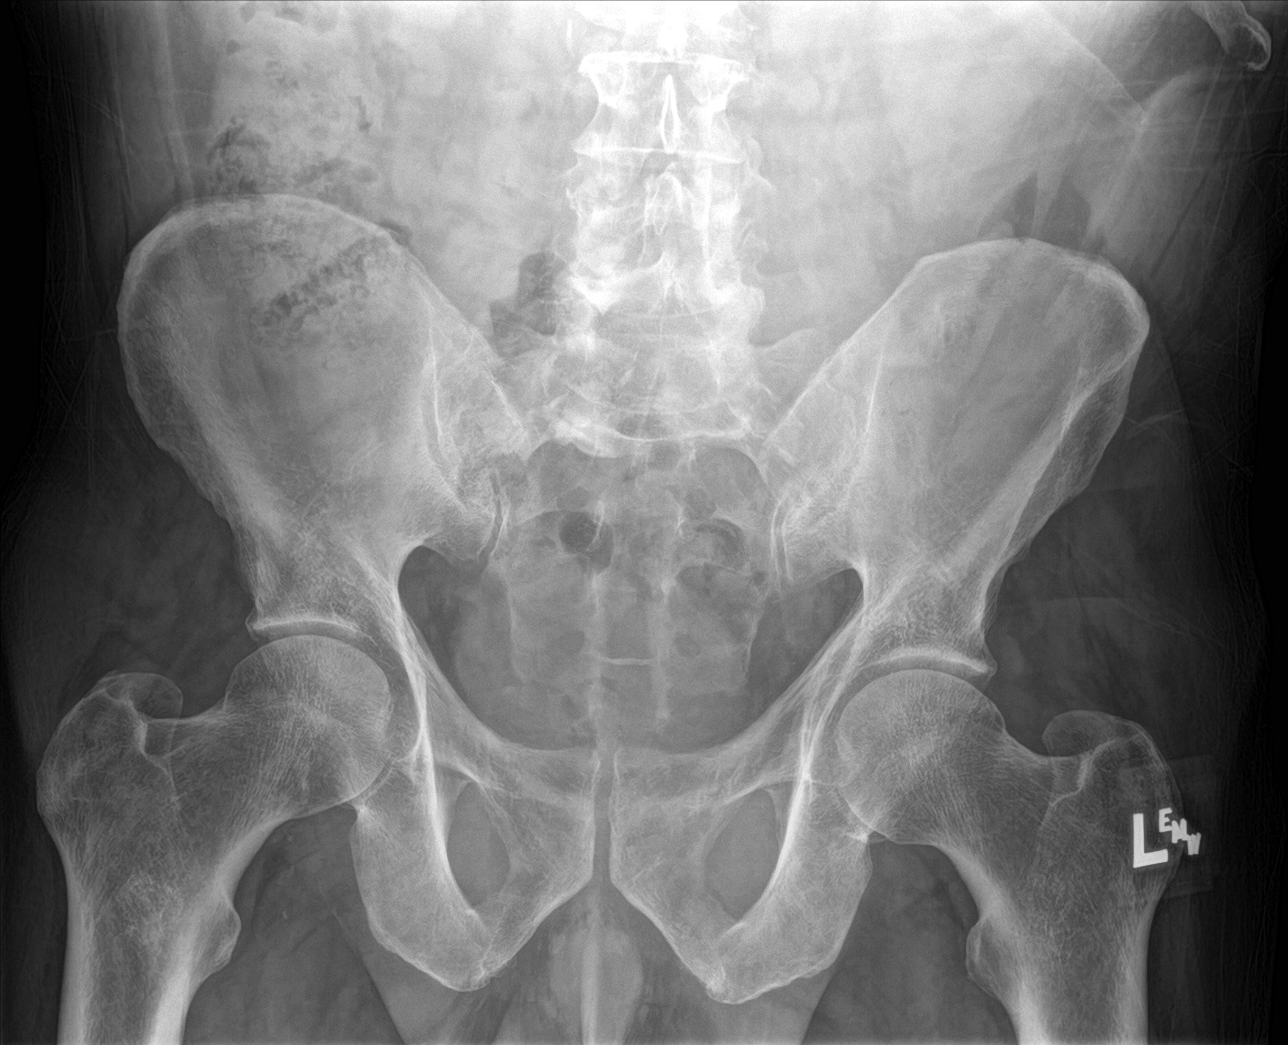

[hip ap]
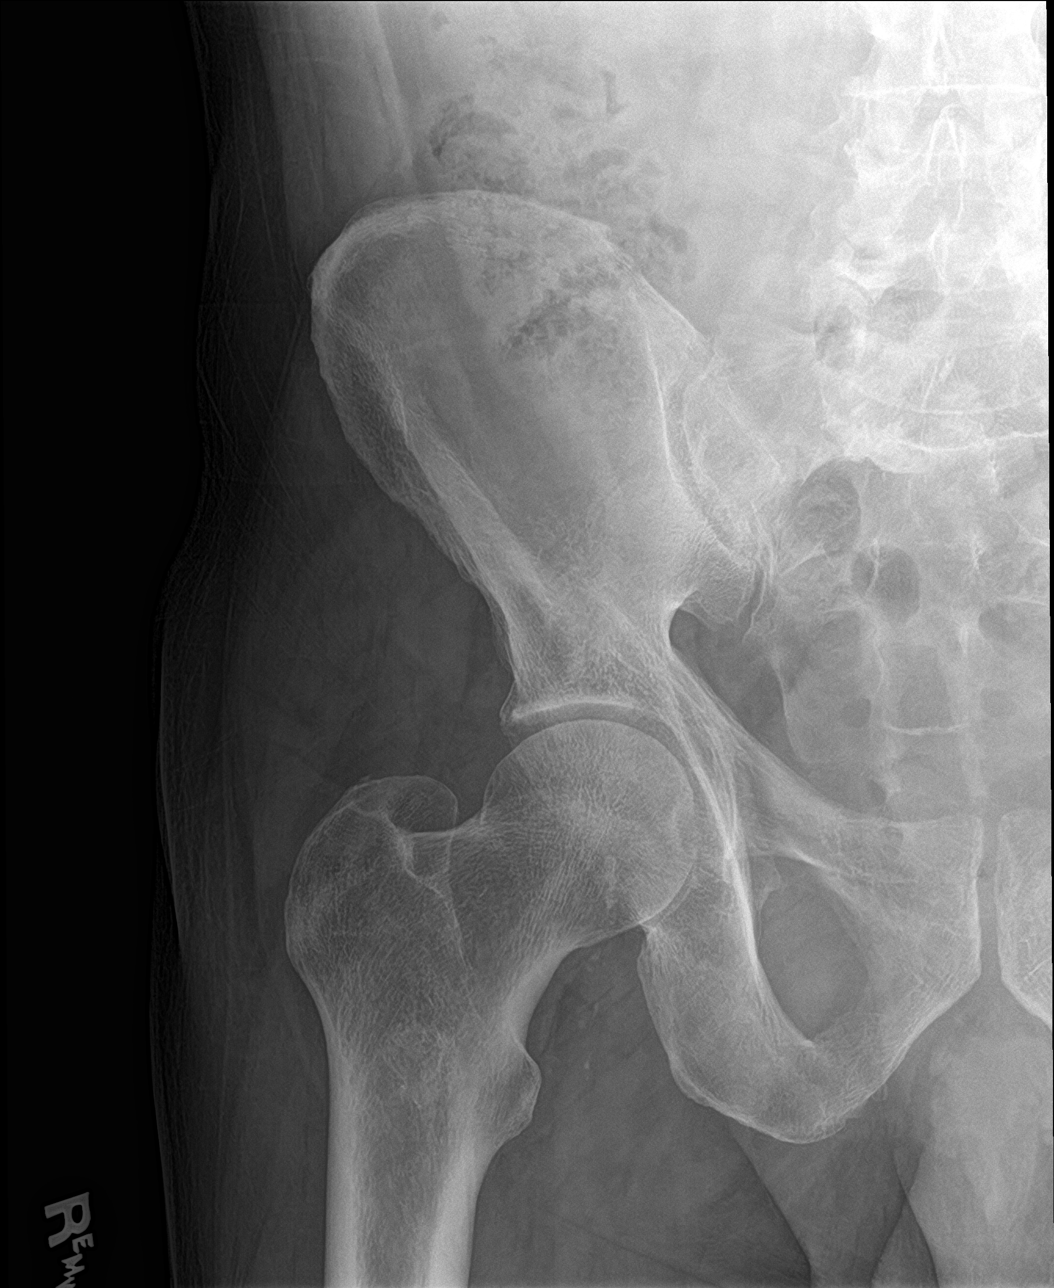

[hip lat]
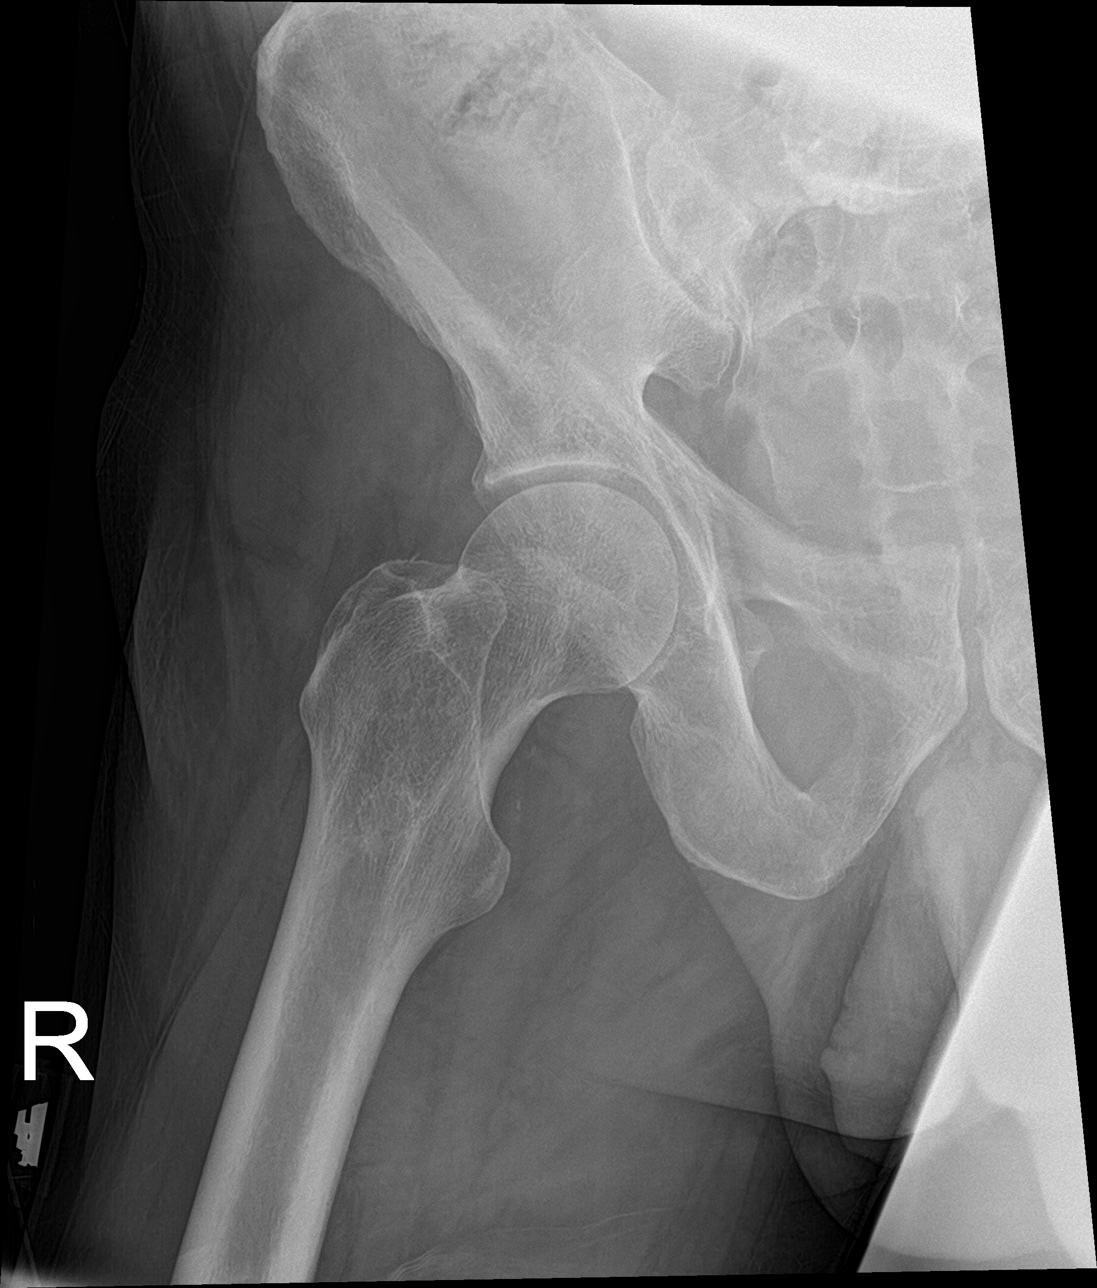

[3 of 3 positions shown; findings below may reference images not displayed]

FINDINGS: The bones are subjectively adequately mineralized. There is no lytic
or blastic pelvic lesion. The hip joint spaces are reasonably
well-maintained. AP and lateral views of the right hip reveal the
articular margins of the femoral head and acetabulum to remains
smooth. The femoral neck, intertrochanteric, and subtrochanteric
regions are normal. There is a stable cyst in the intertrochanteric
region of the right proximal femur.
IMPRESSION: There is no acute or significant chronic bony abnormality of the
pelvis.

## 2019-03-06 ENCOUNTER — Encounter: Payer: Self-pay | Admitting: *Deleted

## 2019-03-12 DIAGNOSIS — Z8551 Personal history of malignant neoplasm of bladder: Secondary | ICD-10-CM | POA: Diagnosis not present

## 2019-03-12 DIAGNOSIS — N401 Enlarged prostate with lower urinary tract symptoms: Secondary | ICD-10-CM | POA: Diagnosis not present

## 2019-03-12 DIAGNOSIS — R972 Elevated prostate specific antigen [PSA]: Secondary | ICD-10-CM | POA: Diagnosis not present

## 2019-03-12 DIAGNOSIS — R8279 Other abnormal findings on microbiological examination of urine: Secondary | ICD-10-CM | POA: Diagnosis not present

## 2019-03-19 ENCOUNTER — Encounter: Payer: Self-pay | Admitting: Family Medicine

## 2019-03-24 ENCOUNTER — Other Ambulatory Visit: Payer: Self-pay | Admitting: Family Medicine

## 2019-03-25 ENCOUNTER — Other Ambulatory Visit: Payer: Self-pay | Admitting: Family Medicine

## 2019-03-28 ENCOUNTER — Ambulatory Visit: Payer: BLUE CROSS/BLUE SHIELD | Admitting: Cardiology

## 2019-03-29 ENCOUNTER — Ambulatory Visit: Payer: BLUE CROSS/BLUE SHIELD | Admitting: Family Medicine

## 2019-03-29 ENCOUNTER — Other Ambulatory Visit: Payer: Self-pay

## 2019-03-29 ENCOUNTER — Other Ambulatory Visit (INDEPENDENT_AMBULATORY_CARE_PROVIDER_SITE_OTHER): Payer: BC Managed Care – PPO

## 2019-03-29 ENCOUNTER — Telehealth: Payer: Self-pay

## 2019-03-29 ENCOUNTER — Encounter: Payer: Self-pay | Admitting: Family Medicine

## 2019-03-29 VITALS — BP 132/84 | HR 77 | Temp 97.9°F | Resp 16 | Ht 67.0 in | Wt 181.8 lb

## 2019-03-29 DIAGNOSIS — E7800 Pure hypercholesterolemia, unspecified: Secondary | ICD-10-CM

## 2019-03-29 DIAGNOSIS — I10 Essential (primary) hypertension: Secondary | ICD-10-CM | POA: Diagnosis not present

## 2019-03-29 DIAGNOSIS — Z23 Encounter for immunization: Secondary | ICD-10-CM | POA: Diagnosis not present

## 2019-03-29 DIAGNOSIS — E1159 Type 2 diabetes mellitus with other circulatory complications: Secondary | ICD-10-CM | POA: Diagnosis not present

## 2019-03-29 LAB — BASIC METABOLIC PANEL
BUN: 16 mg/dL (ref 6–23)
CO2: 25 mEq/L (ref 19–32)
Calcium: 9.4 mg/dL (ref 8.4–10.5)
Chloride: 104 mEq/L (ref 96–112)
Creatinine, Ser: 1.02 mg/dL (ref 0.40–1.50)
GFR: 73.57 mL/min (ref >60.00)
Glucose, Bld: 139 mg/dL — ABNORMAL HIGH (ref 70–99)
Potassium: 4.5 mEq/L (ref 3.5–5.1)
Sodium: 137 mEq/L (ref 135–145)

## 2019-03-29 LAB — HEMOGLOBIN A1C: Hgb A1c MFr Bld: 7 % — ABNORMAL HIGH (ref 4.6–6.5)

## 2019-03-29 MED ORDER — SITAGLIPTIN PHOSPHATE 100 MG PO TABS: 100.0000 mg | ORAL_TABLET | Freq: Every day | ORAL | 1 refills | Status: DC

## 2019-03-29 MED ORDER — NITROGLYCERIN 0.4 MG SL SUBL: 0.4000 mg | SUBLINGUAL_TABLET | SUBLINGUAL | 3 refills | Status: DC | PRN

## 2019-03-29 NOTE — Telephone Encounter (Signed)
Patient was unable to use the restroom while at his appointment for lab. Patient stated that he would come back later on. Patient did not show. Dr. Anitra Lauth said it is fine if the patient waits until his next appointment. Labs for urine has been shredded.

## 2019-03-29 NOTE — Progress Notes (Signed)
OFFICE VISIT  03/29/2019   CC:  Chief Complaint  Patient presents with  . Follow-up    RCI, pt is fasting   HPI:    Patient is a 63 y.o. Caucasian male who presents for 3 mo f/u DM, HTN, HLD. He has hist of CAD and has ischemic cardiomyopathy.  DM: no glucose monitoring in the last month or two. HTN: no home bp monitoring lately, either. HLD: taking statin daily.   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  ROS: no CP, no SOB, no wheezing, no cough, no dizziness, no HAs, no rashes, no melena/hematochezia.  No polyuria or polydipsia.  No myalgias.  Has chronic bilat knee arthralgias.  Past Medical History:  Diagnosis Date  . Arthritis   . Ascending aorta dilatation (HCC)    a. Ascending aorta mildly dilated by echo 09/03/15.  . Bladder cancer [facility]) 2012   Mass removed, then BCG by urologist (Dr. Alinda Money).  Recurrence documented on cysto + bladder washings 10/14/15--getting more maintenance BCG.  Cysto 01/20/16 OK, then more BCG given.  Cysto 04/13/16 c/w just having finished BCG but cytology/FISH ok.  Cysto 07/27/2016 c/w small area of BCG related inflamm vs recurrent carcinoma in situ--washings for cytology done, BCG stopped due to severe sx's.  . Bladder cancer ([place])    11/02/16 cysto showed 1.5 cm raised erythematous lesion suspicious for recurrence--cystoscopic resection was done and path was benign 10/2016.  Subsequent cystoscopies unchanged, most recent 07/2018..  . BPH (benign prostatic hyperplasia)    with hx of acute prostatitis  . CAD (coronary artery disease)    a. '05 MI > DES to circumflex. b. NSTEMI 08/2015 with total occlusion of the mid LAD along with 20% mid-distal Cx, 100% dCX, 40% D1, 50% pCx, 50% m-dRCA, 80% dRCA.Given >12 hours out from symptom onset, medical therapy recommended.  . Complication of anesthesia    pt awakens aggitated and aggressive   . Diabetes mellitus type 2, controlled ([place])    HbA1c 02/2011 was 6.6%  .  Elevated PSA 09/2011   with prostate nodule.  Prostate bx ALL BENIGN 10/10/11 (Dr. Christella Hartigan followed by Urol--improved to 1.18 Jun 2017.  Nodule NOT palpable anymore on DRE at [facility] 01/2018 office f/u. Stable as of 07/2018.  Marland Kitchen GERD (gastroesophageal reflux disease)    occasional  . Headache(784.0)    cluster headaches a long time ago  . History of bronchitis   . History of vertigo   . HTN (hypertension)   . Hyperlipidemia   . Ischemic cardiomyopathy    a. 2D Echo 09/03/15: mild LVH, EF 40-45%, diffuse HK, akinesis of apical myocardium, grade 1 DD, aortic root dimention 38mm, ascending aorta mildly dilated, mildly dilated RV/RA.   Marland Kitchen Myocardial infarction [facility]) 2005; 08/2015   followed by Dr. Stanford Breed  . Thrombocytopenia ([place])    Stable 2012-2017  . Tobacco abuse   . Urinary tract infection   . Wolf-Parkinson-White syndrome     Past Surgical History:  Procedure Laterality Date  . BLADDER TUMOR EXCISION  11/08/10; 05/2015   urothelial carcinoma (Dr. Alinda Money).  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  Marland Kitchen CARDIAC CATHETERIZATION  2005   with stent placement  . CARDIAC CATHETERIZATION N/A 09/01/2015   Procedure: Left Heart Cath and Coronary Angiography;  Surgeon: Belva Crome, MD;  Location: [place] [facility];  Service: Cardiovascular;  Laterality: N/A;  . COLONOSCOPY  2010   Dr. Isa Rankin, repeat 2020.  Patient wants to go to different GI when time for repeat.  . CYSTOSCOPY W/ RETROGRADES Bilateral 06/22/2015   Procedure: CYSTOSCOPY WITH RETROGRADE PYELOGRAM;  Surgeon: Raynelle Bring, MD;  [facility];  Service: Urology;  Laterality: Bilateral;  . CYSTOSCOPY W/ RETROGRADES N/A 11/17/2016   Procedure: CYSTOSCOPY WITH RETROGRADE PYELOGRAM;  Surgeon: Raynelle Bring, MD;  [facility];  Service: Urology;  Laterality: N/A;  . CYSTOSCOPY WITH RETROGRADE PYELOGRAM, URETEROSCOPY AND STENT PLACEMENT N/A 06/06/2013   Procedure: CYSTOSCOPY WITH  RETROGRADE PYELOGRAM;  Surgeon: Dutch Gray, MD;  [facility];  Service: Urology;  Laterality: N/A;  . lexiscan  2012   Normal/negative  . [place] in [place] ([place])-no trouble since last surgery  . myocardial perfusion study  2009   Neg ischemia, EF 52%  . TRANSURETHRAL RESECTION OF BLADDER TUMOR N/A 06/06/2013   Procedure: TRANSURETHRAL RESECTION OF BLADDER TUMOR (TURBT);  Surgeon: Dutch Gray, MD;  [facility];  Service: Urology;  Laterality: N/A;.  PATH: squamous metaplasia, fibrosis with chronic inflammation, no malignancy.  . TRANSURETHRAL RESECTION OF BLADDER TUMOR N/A 06/22/2015   Carcinoma in situ: no invasive malignancy.  Procedure: TRANSURETHRAL RESECTION OF BLADDER TUMOR (TURBT);  Surgeon: Raynelle Bring, MD;  [facility];  Service: Urology;  Laterality: N/A;  **BLADDER BIOPSY*RESECTION**  . TRANSURETHRAL RESECTION OF BLADDER TUMOR N/A 11/17/2016   Path: BENIGN UROTHELIUM WITH INFLAMMATION--no malignancy. Procedure: TRANSURETHRAL RESECTION OF BLADDER TUMOR (TURBT);  Surgeon: Raynelle Bring, MD;  [facility];  Service: Urology;  Laterality: N/A;  . VASECTOMY      Outpatient Medications Prior to Visit  Medication Sig Dispense Refill  . acetaminophen (TYLENOL) 500 MG tablet Take 1,000 mg by mouth daily as needed for mild pain or headache.     Marland Kitchen aspirin EC 81 MG tablet Take 81 mg by mouth daily.    Marland Kitchen atenolol (TENORMIN) 50 MG tablet TAKE ONE TABLET BY MOUTH EVERY DAY WITH LUNCH 90 tablet 1  . atorvastatin (LIPITOR) 80 MG tablet TAKE ONE TABLET BY MOUTH EVERY EVENING 90 tablet 3  . calcium carbonate (TUMS - DOSED IN MG ELEMENTAL CALCIUM) 500 MG chewable tablet Chew 1 tablet by mouth daily as needed for indigestion or heartburn.    . fluticasone (FLONASE) 50 MCG/ACT nasal spray Place 1 spray into both nostrils daily as needed for allergies. 16 g 3  . glipiZIDE (GLUCOTROL XL) 10 MG 24 hr tablet TAKE 1 TABLET (10 MG TOTAL) BY MOUTH  DAILY WITH BREAKFAST. 90 tablet 0  . JANUVIA 100 MG tablet TAKE 1 TABLET BY MOUTH EVERY DAY 90 tablet 1  . lisinopril (ZESTRIL) 10 MG tablet TAKE 1 TABLET (10 MG TOTAL) BY MOUTH DAILY.** PLEASE CANCEL LISINOPRIL 20MG  RX** 90 tablet 1  . metFORMIN (GLUCOPHAGE) 1000 MG tablet TAKE 1 TABLET BY MOUTH TWICE A DAY WITH MEALS 180 tablet 0  . oxybutynin (DITROPAN) 5 MG tablet Take 5 mg by mouth daily as needed for bladder spasms.     . phenazopyridine (PYRIDIUM) 100 MG tablet Take 1 tablet (100 mg total) by mouth 3 (three) times daily as needed for pain (for burning). 20 tablet 0  . tamsulosin (FLOMAX) 0.4 MG CAPS capsule TAKE 1 CAPSULE BY MOUTH EVERY DAY AFTER SUPPER 90 capsule 1  . HYDROcodone-acetaminophen (NORCO/VICODIN) 5-325 MG tablet Take 1-2 tablets by mouth every 6 (six) hours as needed for moderate pain. (Patient not taking: Reported on 03/29/2019) 30 tablet 0  . nitroGLYCERIN (NITROSTAT)  0.4 MG SL tablet Place 1 tablet (0.4 mg total) under the tongue every 5 (five) minutes as needed for chest pain (up to 3 doses). (Patient not taking: Reported on 03/29/2019) 25 tablet 3   No facility-administered medications prior to visit.     No Known Allergies  ROS As per HPI  PE: Blood pressure 132/84, pulse 77, temperature 97.9 F (36.6 C), temperature source Temporal, resp. rate 16, height 5\' 7"  (1.702 m), weight 181 lb 12.8 oz (82.5 kg), SpO2 96 %. Body mass index is 28.47 kg/m.  Gen: Alert, well appearing.  Patient is oriented to person, place, time, and situation. AFFECT: pleasant, lucid thought and speech. CV: RRR, no m/r/g.   LUNGS: CTA bilat, nonlabored resps, good aeration in all lung fields. EXT: no clubbing or cyanosis.  no edema.    LABS:  Lab Results  Component Value Date   TSH 0.96 08/26/2015   Lab Results  Component Value Date   WBC 7.6 06/05/2017   HGB 15.7 06/05/2017   HCT 45.7 06/05/2017   MCV 91.8 06/05/2017   PLT 115.0 (L) 06/05/2017   Lab Results  Component Value  Date   CREATININE 1.15 10/04/2018   BUN 19 10/04/2018   NA 137 10/04/2018   K 4.5 10/04/2018   CL 104 10/04/2018   CO2 24 10/04/2018   Lab Results  Component Value Date   ALT 21 10/04/2018   AST 20 10/04/2018   ALKPHOS 65 10/04/2018   BILITOT 0.7 10/04/2018   Lab Results  Component Value Date   CHOL 88 10/04/2018   Lab Results  Component Value Date   HDL 27.30 (L) 10/04/2018   Lab Results  Component Value Date   LDLCALC 43 10/04/2018   Lab Results  Component Value Date   TRIG 88.0 10/04/2018   Lab Results  Component Value Date   CHOLHDL 3 10/04/2018   Lab Results  Component Value Date   PSA 2.42 03/05/2019   PSA 1.25 06/21/2017   PSA 2.25 01/13/2016   Lab Results  Component Value Date   HGBA1C 7.0 (H) 10/04/2018    IMPRESSION AND PLAN:  1) DM 2, historically fairly well controlled. No monitoring at home lately.  Diet is fair.  Activity level good. A1c, BMET, microalb/cr today. Flu vaccine->given today.  2) HTN: bp fine here today. Needs to monitor periodically at home. Lytes/cr today.  3) HLD: tolerating statin. Diet and activity level fair. Wt stable.  An After Visit Summary was printed and given to the patient.  FOLLOW UP: No follow-ups on file.3 mo cpe  Signed:  Crissie Sickles, MD           03/29/2019

## 2019-03-31 ENCOUNTER — Encounter: Payer: Self-pay | Admitting: Family Medicine

## 2019-04-02 ENCOUNTER — Other Ambulatory Visit: Payer: Self-pay

## 2019-04-02 MED ORDER — FLUTICASONE PROPIONATE 50 MCG/ACT NA SUSP: 1.0000 | Freq: Every day | NASAL | 3 refills | Status: DC | PRN

## 2019-04-23 ENCOUNTER — Other Ambulatory Visit: Payer: Self-pay | Admitting: Family Medicine

## 2019-04-28 ENCOUNTER — Encounter: Payer: Self-pay | Admitting: Family Medicine

## 2019-05-25 ENCOUNTER — Other Ambulatory Visit: Payer: Self-pay | Admitting: Family Medicine

## 2019-06-04 ENCOUNTER — Other Ambulatory Visit: Payer: Self-pay | Admitting: Cardiology

## 2019-06-12 ENCOUNTER — Other Ambulatory Visit: Payer: Self-pay | Admitting: Family Medicine

## 2019-06-14 NOTE — Progress Notes (Signed)
HPI: FU coronary disease status post drug-eluting stent to the circumflex in August 2005. Patient admitted 2/17 with myocardial infarction. Cardiac catheterization February 2017 showed an occluded distal circumflex. The mid to distal LAD was occluded as well. Ejection fraction 40-50%. Patient was treated medically because of late presentation. Echocardiogram February 2017 showed ejection fraction 40-45% with akinesis of the apical myocardium. Mild right atrial and right ventricular enlargement. Since I last saw him, there is no dyspnea, chest pain, palpitations or syncope.  He describes pain in his calves bilaterally with ambulation worse on the right.  This is new in onset since September.  Current Outpatient Medications  Medication Sig Dispense Refill  . acetaminophen (TYLENOL) 500 MG tablet Take 1,000 mg by mouth daily as needed for mild pain or headache.     Marland Kitchen aspirin EC 81 MG tablet Take 81 mg by mouth daily.    Marland Kitchen atenolol (TENORMIN) 50 MG tablet TAKE ONE TABLET BY MOUTH EVERY DAY WITH LUNCH 90 tablet 1  . atorvastatin (LIPITOR) 80 MG tablet TAKE ONE TABLET BY MOUTH EVERY EVENING 90 tablet 0  . calcium carbonate (TUMS - DOSED IN MG ELEMENTAL CALCIUM) 500 MG chewable tablet Chew 1 tablet by mouth daily as needed for indigestion or heartburn.    . fluticasone (FLONASE) 50 MCG/ACT nasal spray Place 1 spray into both nostrils daily as needed for allergies. 16 g 3  . glipiZIDE (GLUCOTROL XL) 10 MG 24 hr tablet TAKE 1 TABLET (10 MG TOTAL) BY MOUTH DAILY WITH BREAKFAST. 90 tablet 0  . HYDROcodone-acetaminophen (NORCO/VICODIN) 5-325 MG tablet Take 1-2 tablets by mouth every 6 (six) hours as needed for moderate pain. 30 tablet 0  . lisinopril (ZESTRIL) 10 MG tablet TAKE 1 TABLET (10 MG TOTAL) BY MOUTH DAILY 90 tablet 1  . metFORMIN (GLUCOPHAGE) 1000 MG tablet TAKE 1 TABLET BY MOUTH TWICE A DAY WITH MEALS 180 tablet 0  . nitroGLYCERIN (NITROSTAT) 0.4 MG SL tablet Place 1 tablet (0.4 mg total)  under the tongue every 5 (five) minutes as needed for chest pain (up to 3 doses). 25 tablet 3  . oxybutynin (DITROPAN) 5 MG tablet Take 5 mg by mouth daily as needed for bladder spasms.     . phenazopyridine (PYRIDIUM) 100 MG tablet Take 1 tablet (100 mg total) by mouth 3 (three) times daily as needed for pain (for burning). 20 tablet 0  . sitaGLIPtin (JANUVIA) 100 MG tablet Take 1 tablet (100 mg total) by mouth daily. 90 tablet 1  . tamsulosin (FLOMAX) 0.4 MG CAPS capsule TAKE 1 CAPSULE BY MOUTH EVERY DAY AFTER SUPPER 90 capsule 1   No current facility-administered medications for this visit.      Past Medical History:  Diagnosis Date  . Arthritis   . Ascending aorta dilatation (HCC)    a. Ascending aorta mildly dilated by echo 09/03/15.  . Bladder cancer [facility]) 2012   Mass removed, then BCG by urologist (Dr. Alinda Money).  Recurrence documented on cysto + bladder washings 10/14/15--getting more maintenance BCG.  Cysto 01/20/16 OK, then more BCG given.  Cysto 04/13/16 c/w just having finished BCG but cytology/FISH ok.  Cysto 07/27/2016 c/w small area of BCG related inflamm vs recurrent carcinoma in situ--washings for cytology done, BCG stopped due to severe sx's.  . Bladder cancer [facility])    Surveillance cysto 11/02/16 cysto showed 1.5 cm raised erythematous lesion->bx benign. Subsequent cystoscopies unchanged, most recent 07/2018..  . BPH (benign prostatic hyperplasia)    with hx  of acute prostatitis  . CAD (coronary artery disease)    a. '05 MI > DES to circumflex. b. NSTEMI 08/2015 with total occlusion of the mid LAD along with 20% mid-distal Cx, 100% dCX, 40% D1, 50% pCx, 50% m-dRCA, 80% dRCA.Given >12 hours out from symptom onset, medical therapy recommended.  . Complication of anesthesia    pt awakens aggitated and aggressive   . Diabetes mellitus type 2, controlled ([place])    HbA1c 02/2011 was 6.6%  . Elevated PSA 09/2011   with prostate nodule.  Prostate bx ALL BENIGN 10/10/11 (Dr. Christella Hartigan  followed by Urol--improved to 1.18 Jun 2017.  Nodule NOT palpable anymore on DRE at [facility] 01/2018 office f/u. Stable as of 07/2018.  Marland Kitchen GERD (gastroesophageal reflux disease)    occasional  . Headache(784.0)    cluster headaches a long time ago  . History of bronchitis   . History of vertigo   . HTN (hypertension)   . Hyperlipidemia   . Ischemic cardiomyopathy    a. 2D Echo 09/03/15: mild LVH, EF 40-45%, diffuse HK, akinesis of apical myocardium, grade 1 DD, aortic root dimention 67mm, ascending aorta mildly dilated, mildly dilated RV/RA.   Marland Kitchen Myocardial infarction [facility]) 2005; 08/2015   followed by Dr. Stanford Breed  . Thrombocytopenia ([place])    Stable 2012-2017  . Tobacco abuse   . Urinary tract infection   . Wolf-Parkinson-White syndrome     Past Surgical History:  Procedure Laterality Date  . BLADDER TUMOR EXCISION  11/08/10; 05/2015   urothelial carcinoma (Dr. Alinda Money).  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  Marland Kitchen CARDIAC CATHETERIZATION  2005   with stent placement  . CARDIAC CATHETERIZATION N/A 09/01/2015   Procedure: Left Heart Cath and Coronary Angiography;  Surgeon: Belva Crome, MD;  Location: [place] CV LAB;  Service: Cardiovascular;  Laterality: N/A;  . COLONOSCOPY  2010   Dr. Isa Rankin, repeat 2020.  Patient wants to go to different GI when time for repeat.  . CYSTOSCOPY W/ RETROGRADES Bilateral 06/22/2015   Procedure: CYSTOSCOPY WITH RETROGRADE PYELOGRAM;  Surgeon: Raynelle Bring, MD;  [facility];  Service: Urology;  Laterality: Bilateral;  . CYSTOSCOPY W/ RETROGRADES N/A 11/17/2016   Procedure: CYSTOSCOPY WITH RETROGRADE PYELOGRAM;  Surgeon: Raynelle Bring, MD;  [facility];  Service: Urology;  Laterality: N/A;  . CYSTOSCOPY WITH RETROGRADE PYELOGRAM, URETEROSCOPY AND STENT PLACEMENT N/A 06/06/2013   Procedure: CYSTOSCOPY WITH RETROGRADE PYELOGRAM;  Surgeon: Dutch Gray, MD;  [facility];  Service: Urology;  Laterality: N/A;   . lexiscan  2012   Normal/negative  . [place] in [place] ([place])-no trouble since last surgery  . myocardial perfusion study  2009   Neg ischemia, EF 52%  . TRANSURETHRAL RESECTION OF BLADDER TUMOR N/A 06/06/2013   Procedure: TRANSURETHRAL RESECTION OF BLADDER TUMOR (TURBT);  Surgeon: Dutch Gray, MD;  [facility];  Service: Urology;  Laterality: N/A;.  PATH: squamous metaplasia, fibrosis with chronic inflammation, no malignancy.  . TRANSURETHRAL RESECTION OF BLADDER TUMOR N/A 06/22/2015   Carcinoma in situ: no invasive malignancy.  Procedure: TRANSURETHRAL RESECTION OF BLADDER TUMOR (TURBT);  Surgeon: Raynelle Bring, MD;  [facility];  Service: Urology;  Laterality: N/A;  **BLADDER BIOPSY*RESECTION**  . TRANSURETHRAL RESECTION OF BLADDER TUMOR N/A 11/17/2016   Path: BENIGN UROTHELIUM WITH INFLAMMATION--no malignancy. Procedure: TRANSURETHRAL RESECTION OF BLADDER TUMOR (TURBT);  Surgeon: Raynelle Bring, MD;  [facility];  Service: Urology;  Laterality: N/A;  . VASECTOMY      Social History   Socioeconomic History  . Marital status: Married    Spouse name: Not on file  . Number of children: Not on file  . Years of education: Not on file  . Highest education level: Not on file  Occupational History  . Not on file  Social Needs  . Financial resource strain: Not on file  . Food insecurity    Worry: Not on file    Inability: Not on file  . Transportation needs    Medical: Not on file    Non-medical: Not on file  Tobacco Use  . Smoking status: Former Smoker    Packs/day: 0.25    Years: 40.00    Pack years: 10.00    Types: Cigarettes    Quit date: 09/05/2015    Years since quitting: 3.8  . Smokeless tobacco: Never Used  Substance and Sexual Activity  . Alcohol use: No    Alcohol/week: 0.0 standard drinks  . Drug use: No  . Sexual activity: Not on file  Lifestyle  . Physical activity    Days per week: Not on file    Minutes  per session: Not on file  . Stress: Not on file  Relationships  . Social Herbalist on phone: Not on file    Gets together: Not on file    Attends religious service: Not on file    Active member of club or organization: Not on file    Attends meetings of clubs or organizations: Not on file    Relationship status: Not on file  . Intimate partner violence    Fear of current or ex partner: Not on file    Emotionally abused: Not on file    Physically abused: Not on file    Forced sexual activity: Not on file  Other Topics Concern  . Not on file  Social History Narrative   Married, 2 children, 4 grandchildren--[place] all [place] the same farm now.   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   Tob: 30 pack-yr hx, quit 08/2011.   No alcohol or drug use.   Active on farm but no formal exercise regimen.    Family History  Problem Relation Age of Onset  . Hypertension Mother   . Cancer Father        colon and prostate, mets to lungs.  . Hypertension Father   . Heart disease Brother        d. 39 MI  . Multiple sclerosis Brother   . Diabetes Brother   . Heart disease Paternal Uncle     ROS: no fevers or chills, productive cough, hemoptysis, dysphasia, odynophagia, melena, hematochezia, dysuria, hematuria, rash, seizure activity, orthopnea, PND, pedal edema, claudication. Remaining systems are negative.  Physical Exam: Well-developed well-nourished in no acute distress.  Skin is warm and dry.  HEENT is normal.  Neck is supple.  Chest is clear to auscultation with normal expansion.  Cardiovascular exam is regular rate and rhythm.  Abdominal exam nontender or distended. No masses palpated. Extremities show no edema.  Diminished posterior tibial pulse on the right neuro grossly intact  ECG-sinus rhythm at a rate of 63, prior inferior infarct.  Personally reviewed  A/P  1 coronary artery  disease-no recurrent chest pain.  Continue present medications including aspirin and statin.  2 hypertension-blood pressure controlled.  Continue present medications and follow.  3 hyperlipidemia-continue statin.  4 ischemic cardiomyopathy-patient is not having symptoms of congestive heart failure.  Continue ACE inhibitor and beta-blocker.  5 diabetes mellitus-Per primary care.  6 probable new onset claudication-we will arrange ABIs with Doppler.  If abnormal I will ask him to see Dr. Gwenlyn Found or Dr. Fletcher Anon.  Kirk Ruths, MD

## 2019-06-25 ENCOUNTER — Encounter: Payer: Self-pay | Admitting: Cardiology

## 2019-06-25 ENCOUNTER — Other Ambulatory Visit: Payer: Self-pay

## 2019-06-25 ENCOUNTER — Ambulatory Visit: Payer: BC Managed Care – PPO | Admitting: Cardiology

## 2019-06-25 VITALS — BP 122/72 | HR 63 | Temp 96.9°F | Ht 67.0 in | Wt 188.2 lb

## 2019-06-25 DIAGNOSIS — I10 Essential (primary) hypertension: Secondary | ICD-10-CM | POA: Diagnosis not present

## 2019-06-25 DIAGNOSIS — I739 Peripheral vascular disease, unspecified: Secondary | ICD-10-CM

## 2019-06-25 DIAGNOSIS — I2510 Atherosclerotic heart disease of native coronary artery without angina pectoris: Secondary | ICD-10-CM

## 2019-06-25 DIAGNOSIS — E7800 Pure hypercholesterolemia, unspecified: Secondary | ICD-10-CM

## 2019-06-25 DIAGNOSIS — I255 Ischemic cardiomyopathy: Secondary | ICD-10-CM

## 2019-06-25 HISTORY — DX: Peripheral vascular disease, unspecified: I73.9

## 2019-06-25 HISTORY — PX: OTHER SURGICAL HISTORY: SHX169

## 2019-06-25 NOTE — Patient Instructions (Signed)
Medication Instructions:  NO CHANGE *If you need a refill on your cardiac medications before your next appointment, please call your pharmacy*  Lab Work: If you have labs (blood work) drawn today and your tests are completely normal, you will receive your results only by: Marland Kitchen MyChart Message (if you have MyChart) OR . A paper copy in the mail If you have any lab test that is abnormal or we need to change your treatment, we will call you to review the results.  Testing/Procedures: Your physician has requested that you have an ankle brachial index (ABI). During this test an ultrasound and blood pressure cuff are used to evaluate the arteries that supply the arms and legs with blood. Allow thirty minutes for this exam. There are no restrictions or special instructions.[facility]    Follow-Up: At [facility], you and your health needs are our priority.  As part of our continuing mission to provide you with exceptional heart care, we have created designated Provider Care Teams.  These Care Teams include your primary Cardiologist (physician) and Advanced Practice Providers (APPs -  Physician Assistants and Nurse Practitioners) who all work together to provide you with the care you need, when you need it.  Your next appointment:   6 month(s)  The format for your next appointment:   Either In Person or Virtual  Provider:   You may see Kirk Ruths MD or one of the following Advanced Practice Providers on your designated Care Team:    Kerin Ransom, PA-C  [place], [place]  Coletta Memos, [place]

## 2019-07-03 ENCOUNTER — Ambulatory Visit (HOSPITAL_COMMUNITY)
Admission: RE | Admit: 2019-07-03 | Discharge: 2019-07-03 | Disposition: A | Discharge status: Home / Self Care | Payer: BC Managed Care – PPO | Source: Ambulatory Visit | Attending: Cardiology | Admitting: Cardiology

## 2019-07-03 ENCOUNTER — Other Ambulatory Visit: Payer: Self-pay

## 2019-07-03 DIAGNOSIS — I739 Peripheral vascular disease, unspecified: Secondary | ICD-10-CM

## 2019-07-09 ENCOUNTER — Ambulatory Visit: Payer: BC Managed Care – PPO | Admitting: Cardiovascular Disease

## 2019-07-09 ENCOUNTER — Other Ambulatory Visit: Payer: Self-pay

## 2019-07-09 ENCOUNTER — Encounter: Payer: Self-pay | Admitting: Cardiovascular Disease

## 2019-07-09 VITALS — BP 128/68 | HR 64 | Ht 67.0 in | Wt 190.6 lb

## 2019-07-09 DIAGNOSIS — E7800 Pure hypercholesterolemia, unspecified: Secondary | ICD-10-CM

## 2019-07-09 DIAGNOSIS — I739 Peripheral vascular disease, unspecified: Secondary | ICD-10-CM

## 2019-07-09 DIAGNOSIS — I255 Ischemic cardiomyopathy: Secondary | ICD-10-CM

## 2019-07-09 DIAGNOSIS — I2510 Atherosclerotic heart disease of native coronary artery without angina pectoris: Secondary | ICD-10-CM | POA: Diagnosis not present

## 2019-07-09 MED ORDER — XARELTO 2.5 MG PO TABS: 2.5000 mg | ORAL_TABLET | Freq: Two times a day (BID) | ORAL | 2 refills | Status: DC

## 2019-07-09 NOTE — Progress Notes (Signed)
Cardiology Office Note   Date:  07/09/2019   ID:  Mark Blankenship, DOB [birth year], MRN [id number]  PCP:  Tammi Sou, MD  Cardiologist:   Dr. Stanford Breed  No chief complaint on file.     History of Present Illness: Mark Blankenship is a 63 y.o. male who was referred by Dr. Stanford Breed for evaluation and management of peripheral arterial disease.  The patient has known history of coronary artery disease with previous myocardial infarction with an occluded LAD with late presentation, ischemic cardiomyopathy with an EF of 40 to 45%, type 2 diabetes for at least 20 years, essential hypertension and hyperlipidemia.  He is not a smoker but does have strong family history of coronary artery disease.  Starting in October, he noticed right calf discomfort after walking about 100 yards.  This has been gradually getting worse.  No symptoms on the left side but he does have significant knee arthritis bilaterally so he does complain of joint pain.  The right calf discomfort has been bothering him especially that [place].  He has no rest pain or lower extremity ulceration. He underwent recent noninvasive vascular testing which showed an ABI of 0.65 on the right and normal on the left.  Duplex showed occluded right popliteal and peroneal arteries.    Past Medical History:  Diagnosis Date  . Arthritis   . Ascending aorta dilatation (HCC)    a. Ascending aorta mildly dilated by echo 09/03/15.  . Bladder cancer [facility]) 2012   Mass removed, then BCG by urologist (Dr. Alinda Money).  Recurrence documented on cysto + bladder washings 10/14/15--getting more maintenance BCG.  Cysto 01/20/16 OK, then more BCG given.  Cysto 04/13/16 c/w just having finished BCG but cytology/FISH ok.  Cysto 07/27/2016 c/w small area of BCG related inflamm vs recurrent carcinoma in situ--washings for cytology done, BCG stopped due to severe sx's.  . Bladder cancer [facility])    Surveillance cysto 11/02/16 cysto showed 1.5  cm raised erythematous lesion->bx benign. Subsequent cystoscopies unchanged, most recent 07/2018..  . BPH (benign prostatic hyperplasia)    with hx of acute prostatitis  . CAD (coronary artery disease)    a. '05 MI > DES to circumflex. b. NSTEMI 08/2015 with total occlusion of the mid LAD along with 20% mid-distal Cx, 100% dCX, 40% D1, 50% pCx, 50% m-dRCA, 80% dRCA.Given >12 hours out from symptom onset, medical therapy recommended.  . Complication of anesthesia    pt awakens aggitated and aggressive   . Diabetes mellitus type 2, controlled ([place])    HbA1c 02/2011 was 6.6%  . Elevated PSA 09/2011   with prostate nodule.  Prostate bx ALL BENIGN 10/10/11 (Dr. Christella Hartigan followed by Urol--improved to 1.18 Jun 2017.  Nodule NOT palpable anymore on DRE at [facility] 01/2018 office f/u. Stable as of 07/2018.  Marland Kitchen GERD (gastroesophageal reflux disease)    occasional  . Headache(784.0)    cluster headaches a long time ago  . History of bronchitis   . History of vertigo   . HTN (hypertension)   . Hyperlipidemia   . Ischemic cardiomyopathy    a. 2D Echo 09/03/15: mild LVH, EF 40-45%, diffuse HK, akinesis of apical myocardium, grade 1 DD, aortic root dimention 56mm, ascending aorta mildly dilated, mildly dilated RV/RA.   Marland Kitchen Myocardial infarction [facility]) 2005; 08/2015   followed by Dr. Stanford Breed  . Thrombocytopenia ([place])    Stable 2012-2017  . Tobacco abuse   . Urinary tract infection   .  Wolf-Parkinson-White syndrome     Past Surgical History:  Procedure Laterality Date  . BLADDER TUMOR EXCISION  11/08/10; 05/2015   urothelial carcinoma (Dr. Alinda Money).  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  Marland Kitchen CARDIAC CATHETERIZATION  2005   with stent placement  . CARDIAC CATHETERIZATION N/A 09/01/2015   Procedure: Left Heart Cath and Coronary Angiography;  Surgeon: Belva Crome, MD;  Location: [place] [facility];  Service: Cardiovascular;  Laterality: N/A;  . COLONOSCOPY  2010   Dr.  Isa Rankin, repeat 2020.  Patient wants to go to different GI when time for repeat.  . CYSTOSCOPY W/ RETROGRADES Bilateral 06/22/2015   Procedure: CYSTOSCOPY WITH RETROGRADE PYELOGRAM;  Surgeon: Raynelle Bring, MD;  Location: [facility];  Service: Urology;  Laterality: Bilateral;  . CYSTOSCOPY W/ RETROGRADES N/A 11/17/2016   Procedure: CYSTOSCOPY WITH RETROGRADE PYELOGRAM;  Surgeon: Raynelle Bring, MD;  Location: [facility];  Service: Urology;  Laterality: N/A;  . CYSTOSCOPY WITH RETROGRADE PYELOGRAM, URETEROSCOPY AND STENT PLACEMENT N/A 06/06/2013   Procedure: CYSTOSCOPY WITH RETROGRADE PYELOGRAM;  Surgeon: Dutch Gray, MD;  Location: [facility];  Service: Urology;  Laterality: N/A;  . lexiscan  2012   Normal/negative  . [place] ([place]no trouble since last surgery  . myocardial perfusion study  2009   Neg ischemia, EF 52%  . TRANSURETHRAL RESECTION OF BLADDER TUMOR N/A 06/06/2013   Procedure: TRANSURETHRAL RESECTION OF BLADDER TUMOR (TURBT);  Surgeon: Dutch Gray, MD;  Location: [facility];  Service: Urology;  Laterality: N/A;.  PATH: squamous metaplasia, fibrosis with chronic inflammation, no malignancy.  . TRANSURETHRAL RESECTION OF BLADDER TUMOR N/A 06/22/2015   Carcinoma in situ: no invasive malignancy.  Procedure: TRANSURETHRAL RESECTION OF BLADDER TUMOR (TURBT);  Surgeon: Raynelle Bring, MD;  Location: [facility];  Service: Urology;  Laterality: N/A;  **BLADDER BIOPSY*RESECTION**  . TRANSURETHRAL RESECTION OF BLADDER TUMOR N/A 11/17/2016   Path: BENIGN UROTHELIUM WITH INFLAMMATION--no malignancy. Procedure: TRANSURETHRAL RESECTION OF BLADDER TUMOR (TURBT);  Surgeon: Raynelle Bring, MD;  Location: [facility];  Service: Urology;  Laterality: N/A;  . VASECTOMY       Current Outpatient Medications  Medication Sig Dispense Refill  . acetaminophen (TYLENOL) 500 MG tablet Take 1,000 mg by mouth daily as needed for mild pain or headache.     Marland Kitchen aspirin EC 81 MG tablet  Take 81 mg by mouth daily.    Marland Kitchen atenolol (TENORMIN) 50 MG tablet TAKE ONE TABLET BY MOUTH EVERY DAY WITH LUNCH 90 tablet 1  . atorvastatin (LIPITOR) 80 MG tablet TAKE ONE TABLET BY MOUTH EVERY EVENING 90 tablet 0  . calcium carbonate (TUMS - DOSED IN MG ELEMENTAL CALCIUM) 500 MG chewable tablet Chew 1 tablet by mouth daily as needed for indigestion or heartburn.    . fluticasone (FLONASE) 50 MCG/ACT nasal spray Place 1 spray into both nostrils daily as needed for allergies. 16 g 3  . glipiZIDE (GLUCOTROL XL) 10 MG 24 hr tablet TAKE 1 TABLET (10 MG TOTAL) BY MOUTH DAILY WITH BREAKFAST. 90 tablet 0  . lisinopril (ZESTRIL) 10 MG tablet TAKE 1 TABLET (10 MG TOTAL) BY MOUTH DAILY 90 tablet 1  . metFORMIN (GLUCOPHAGE) 1000 MG tablet TAKE 1 TABLET BY MOUTH TWICE A DAY WITH MEALS 180 tablet 0  . nitroGLYCERIN (NITROSTAT) 0.4 MG SL tablet Place 1 tablet (0.4 mg total) under the tongue every 5 (five) minutes as needed for chest pain (up to 3 doses).  25 tablet 3  . sitaGLIPtin (JANUVIA) 100 MG tablet Take 1 tablet (100 mg total) by mouth daily. 90 tablet 1  . tamsulosin (FLOMAX) 0.4 MG CAPS capsule TAKE 1 CAPSULE BY MOUTH EVERY DAY AFTER SUPPER 90 capsule 1   No current facility-administered medications for this visit.    Allergies:   Patient has no known allergies.    Social History:  The patient  reports that he quit smoking about 3 years ago. His smoking use included cigarettes. He has a 10.00 pack-year smoking history. He has never used smokeless tobacco. He reports that he does not drink alcohol or use drugs.   Family History:  The patient's family history includes Cancer in his father; Diabetes in his brother; Heart disease in his brother and paternal uncle; Hypertension in his father and mother; Multiple sclerosis in his brother.    ROS:  Please see the history of present illness.   Otherwise, review of systems are positive for none.   All other systems are reviewed and negative.    PHYSICAL  EXAM: VS:  BP 128/68   Pulse 64   Ht 5\' 7"  (1.702 m)   Wt 190 lb 9.6 oz (86.5 kg)   SpO2 94%   BMI 29.85 kg/m  , BMI Body mass index is 29.85 kg/m. GEN: Well nourished, well developed, in no acute distress  HEENT: normal  Neck: no JVD, carotid bruits, or masses Cardiac: RRR; no murmurs, rubs, or gallops,no edema  Respiratory:  clear to auscultation bilaterally, normal work of breathing GI: soft, nontender, nondistended, + BS MS: no deformity or atrophy  Skin: warm and dry, no rash Neuro:  Strength and sensation are intact Psych: euthymic mood, full affect Vascular: Femoral pulses normal bilaterally.  Distal pulses are normal on the left side and not palpable on the right side  EKG:  EKG is not ordered today.    Recent Labs: 10/04/2018: ALT 21 03/29/2019: BUN 16; Creatinine, Ser 1.02; Potassium 4.5; Sodium 137    Lipid Panel    Component Value Date/Time   CHOL 88 10/04/2018 0900   TRIG 88.0 10/04/2018 0900   HDL 27.30 (L) 10/04/2018 0900   CHOLHDL 3 10/04/2018 0900   VLDL 17.6 10/04/2018 0900   LDLCALC 43 10/04/2018 0900   LDLDIRECT 87.2 09/03/2010 1110      Wt Readings from Last 3 Encounters:  07/09/19 190 lb 9.6 oz (86.5 kg)  06/25/19 188 lb 3.2 oz (85.4 kg)  03/29/19 181 lb 12.8 oz (82.5 kg)        No flowsheet data found.    ASSESSMENT AND PLAN:  1.  Peripheral arterial disease with severe right calf claudication Rutherford class III : due to an occluded right popliteal artery.  I discussed the natural history and management of claudication.  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  I discussed the option of attempting a walking program and he wants to do that before considering revascularization.  I gave him instructions about that.  Also given his history of peripheral arterial disease and coronary artery disease, he benefits from being on small dose Xarelto.  Thus, I started  2.5 mg twice daily.  He will need labs upon his follow-up with me in 3 months. If he continues to be severely limited after few months, angiography and endovascular intervention can be considered.  2.  Coronary artery disease involving native coronary arteries without  angina: Continue medical therapy.  3.  Mild ischemic cardiomyopathy: No signs of volume overload.  4.  Hyperlipidemia: Continue high-dose atorvastatin.  Most recent LDL was 43.    Disposition:   FU with me in 3 months  Signed,  Kathlyn Sacramento, MD  07/09/2019 8:27 AM    [place]

## 2019-07-09 NOTE — Patient Instructions (Signed)
Medication Instructions:  START Xarelto 2.5 mg twice daily  *If you need a refill on your cardiac medications before your next appointment, please call your pharmacy*  Lab Work: None ordered If you have labs (blood work) drawn today and your tests are completely normal, you will receive your results only by: Marland Kitchen MyChart Message (if you have MyChart) OR . A paper copy in the mail If you have any lab test that is abnormal or we need to change your treatment, we will call you to review the results.  Testing/Procedures: None ordered  Follow-Up: At [facility], you and your health needs are our priority.  As part of our continuing mission to provide you with exceptional heart care, we have created designated Provider Care Teams.  These Care Teams include your primary Cardiologist (physician) and Advanced Practice Providers (APPs -  Physician Assistants and Nurse Practitioners) who all work together to provide you with the care you need, when you need it.  Your next appointment:   3 month(s)  The format for your next appointment:   In Person  Provider:   Kathlyn Sacramento, MD

## 2019-07-21 ENCOUNTER — Encounter: Payer: Self-pay | Admitting: Family Medicine

## 2019-07-29 LAB — HM DIABETES EYE EXAM

## 2019-08-02 ENCOUNTER — Encounter: Payer: BC Managed Care – PPO | Admitting: Family Medicine

## 2019-08-19 ENCOUNTER — Ambulatory Visit (INDEPENDENT_AMBULATORY_CARE_PROVIDER_SITE_OTHER): Payer: BC Managed Care – PPO | Admitting: Family Medicine

## 2019-08-19 ENCOUNTER — Encounter: Payer: Self-pay | Admitting: Family Medicine

## 2019-08-19 ENCOUNTER — Telehealth: Payer: Self-pay

## 2019-08-19 ENCOUNTER — Other Ambulatory Visit: Payer: Self-pay

## 2019-08-19 VITALS — BP 128/80 | HR 56 | Temp 97.9°F | Resp 16 | Ht 67.0 in | Wt 191.8 lb

## 2019-08-19 DIAGNOSIS — E119 Type 2 diabetes mellitus without complications: Secondary | ICD-10-CM | POA: Diagnosis not present

## 2019-08-19 DIAGNOSIS — I10 Essential (primary) hypertension: Secondary | ICD-10-CM | POA: Diagnosis not present

## 2019-08-19 DIAGNOSIS — Z7901 Long term (current) use of anticoagulants: Secondary | ICD-10-CM | POA: Diagnosis not present

## 2019-08-19 DIAGNOSIS — Z0000 Encounter for general adult medical examination without abnormal findings: Secondary | ICD-10-CM | POA: Diagnosis not present

## 2019-08-19 DIAGNOSIS — E7800 Pure hypercholesterolemia, unspecified: Secondary | ICD-10-CM | POA: Diagnosis not present

## 2019-08-19 DIAGNOSIS — Z1211 Encounter for screening for malignant neoplasm of colon: Secondary | ICD-10-CM | POA: Diagnosis not present

## 2019-08-19 DIAGNOSIS — R972 Elevated prostate specific antigen [PSA]: Secondary | ICD-10-CM

## 2019-08-19 LAB — COMPREHENSIVE METABOLIC PANEL
ALT: 22 U/L (ref 0–53)
AST: 20 U/L (ref 0–37)
Albumin: 4.2 g/dL (ref 3.5–5.2)
Alkaline Phosphatase: 63 U/L (ref 39–117)
BUN: 11 mg/dL (ref 6–23)
CO2: 25 mEq/L (ref 19–32)
Calcium: 9.2 mg/dL (ref 8.4–10.5)
Chloride: 104 mEq/L (ref 96–112)
Creatinine, Ser: 1.08 mg/dL (ref 0.40–1.50)
GFR: 68.79 mL/min (ref >60.00)
Glucose, Bld: 129 mg/dL — ABNORMAL HIGH (ref 70–99)
Potassium: 4 mEq/L (ref 3.5–5.1)
Sodium: 138 mEq/L (ref 135–145)
Total Bilirubin: 0.7 mg/dL (ref 0.2–1.2)
Total Protein: 6.4 g/dL (ref 6.0–8.3)

## 2019-08-19 LAB — CBC WITH DIFFERENTIAL/PLATELET
Basophils Absolute: 0 10*3/uL (ref 0.0–0.1)
Basophils Relative: 0.3 % (ref 0.0–3.0)
Eosinophils Absolute: 0.1 10*3/uL (ref 0.0–0.7)
Eosinophils Relative: 1.4 % (ref 0.0–5.0)
HCT: 46.2 % (ref 39.0–52.0)
Hemoglobin: 16 g/dL (ref 13.0–17.0)
Lymphocytes Relative: 17.9 % (ref 12.0–46.0)
Lymphs Abs: 1.3 10*3/uL (ref 0.7–4.0)
MCHC: 34.6 g/dL (ref 30.0–36.0)
MCV: 91 fl (ref 78.0–100.0)
Monocytes Absolute: 0.6 10*3/uL (ref 0.1–1.0)
Monocytes Relative: 8.5 % (ref 3.0–12.0)
Neutro Abs: 5.1 10*3/uL (ref 1.4–7.7)
Neutrophils Relative %: 71.9 % (ref 43.0–77.0)
Platelets: 108 10*3/uL — ABNORMAL LOW (ref 150.0–400.0)
RBC: 5.08 Mil/uL (ref 4.22–5.81)
RDW: 14.2 % (ref 11.5–15.5)
WBC: 7 10*3/uL (ref 4.0–10.5)

## 2019-08-19 LAB — HEMOGLOBIN A1C: Hgb A1c MFr Bld: 7.7 % — ABNORMAL HIGH (ref 4.6–6.5)

## 2019-08-19 LAB — LIPID PANEL
Cholesterol: 88 mg/dL (ref 0–200)
HDL: 26.6 mg/dL — ABNORMAL LOW (ref >39.00)
LDL Cholesterol: 41 mg/dL (ref 0–99)
NonHDL: 61.7
Total CHOL/HDL Ratio: 3
Triglycerides: 103 mg/dL (ref 0.0–149.0)
VLDL: 20.6 mg/dL (ref 0.0–40.0)

## 2019-08-19 NOTE — Telephone Encounter (Signed)
Patient brought health form to visit for completion. Most sections could be completed during visit. He needs total cholesterol documented then form can be faxed to [place]. Fax # [phone]

## 2019-08-19 NOTE — Progress Notes (Signed)
Office Note 08/19/2019  CC:  Chief Complaint  Patient presents with  . Annual Exam    pt is fasting   HPI:  Mark Blankenship is a 64 y.o. White male who is here for annual health maintenance exam.   Past Medical History:  Diagnosis Date  . Arthritis   . Ascending aorta dilatation (HCC)    a. Ascending aorta mildly dilated by echo 09/03/15.  . Bladder cancer [facility]) 2012   Mass removed, then BCG by urologist (Dr. Alinda Money).  Recurrence documented on cysto + bladder washings 10/14/15--getting more maintenance BCG.  Cysto 01/20/16 OK, then more BCG given.  Cysto 04/13/16 c/w just having finished BCG but cytology/FISH ok.  Cysto 07/27/2016 c/w small area of BCG related inflamm vs recurrent carcinoma in situ--washings for cytology done, BCG stopped due to severe sx's.  . Bladder cancer [facility])    Surveillance cysto 11/02/16 cysto showed 1.5 cm raised erythematous lesion->bx benign. Subsequent cystoscopies unchanged, most recent 07/2018..  . BPH (benign prostatic hyperplasia)    with hx of acute prostatitis  . CAD (coronary artery disease)    a. '05 MI > DES to circumflex. b. NSTEMI 08/2015 with total occlusion of the mid LAD along with 20% mid-distal Cx, 100% dCX, 40% D1, 50% pCx, 50% m-dRCA, 80% dRCA.Given >12 hours out from symptom onset, medical therapy recommended.  . Diabetes mellitus type 2, controlled ([place])    HbA1c 02/2011 was 6.6%  . Elevated PSA 09/2011   with prostate nodule.  Prostate bx ALL BENIGN 10/10/11 (Dr. Christella Hartigan followed by Urol--improved to 1.18 Jun 2017.  Nodule NOT palpable anymore on DRE at [facility] 01/2018 office f/u. Stable as of 07/2018.  Marland Kitchen GERD (gastroesophageal reflux disease)    occasional  . Headache(784.0)    cluster headaches a long time ago  . History of bronchitis   . History of vertigo   . HTN (hypertension)   . Hyperlipidemia   . Ischemic cardiomyopathy    a. 2D Echo 09/03/15: mild LVH, EF 40-45%, diffuse HK, akinesis of apical myocardium, grade 1 DD, aortic  root dimention 46mm, ascending aorta mildly dilated, mildly dilated RV/RA.   Marland Kitchen Myocardial infarction [facility]) 2005; 08/2015   followed by Dr. Stanford Breed  . PAD (peripheral artery disease) ([place]) 06/2019   R LL abnl ABI and doppler->see PSH section of EMR for details.  . Thrombocytopenia ([place])    Stable 2012-2017  . Tobacco abuse   . Wolf-Parkinson-White syndrome     Past Surgical History:  Procedure Laterality Date  . BLADDER TUMOR EXCISION  11/08/10; 05/2015   urothelial carcinoma (Dr. Alinda Money).  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  Marland Kitchen CARDIAC CATHETERIZATION  2005   with stent placement  . CARDIAC CATHETERIZATION N/A 09/01/2015   Procedure: Left Heart Cath and Coronary Angiography;  Surgeon: Belva Crome, MD;  Location: [facility];  Service: Cardiovascular;  Laterality: N/A;  . COLONOSCOPY  2010   Dr. Isa Rankin, repeat 2020.  Patient wants to go to different GI when time for repeat.  . CYSTOSCOPY W/ RETROGRADES Bilateral 06/22/2015   Procedure: CYSTOSCOPY WITH RETROGRADE PYELOGRAM;  Surgeon: Raynelle Bring, MD;  Location: [facility];  Service: Urology;  Laterality: Bilateral;  . CYSTOSCOPY W/ RETROGRADES N/A 11/17/2016   Procedure: CYSTOSCOPY WITH RETROGRADE PYELOGRAM;  Surgeon: Raynelle Bring, MD;  Location: [facility];  Service: Urology;  Laterality: N/A;  . CYSTOSCOPY WITH RETROGRADE PYELOGRAM, URETEROSCOPY AND STENT PLACEMENT N/A 06/06/2013   Procedure: CYSTOSCOPY WITH  RETROGRADE PYELOGRAM;  Surgeon: Dutch Gray, MD;  Location: [facility];  Service: Urology;  Laterality: N/A;  . LE ABI vascular studies  06/2019   R LL claudication + evidence of PAD (ABI very low AND popliteal and peroneal artery occlusion on dopler u/s) in R leg (L leg fine)->referred to Dr. Fletcher Anon.  Conservative mgmt with walking program and low dose xarelto first, then consider revascularization.  Marland Kitchen lexiscan  2012   Normal/negative  . [place]no trouble since last surgery  . myocardial perfusion study  2009   Neg ischemia, EF 52%  . TRANSURETHRAL RESECTION OF BLADDER TUMOR N/A 06/06/2013   Procedure: TRANSURETHRAL RESECTION OF BLADDER TUMOR (TURBT);  Surgeon: Dutch Gray, MD;  Location: [facility];  Service: Urology;  Laterality: N/A;.  PATH: squamous metaplasia, fibrosis with chronic inflammation, no malignancy.  . TRANSURETHRAL RESECTION OF BLADDER TUMOR N/A 06/22/2015   Carcinoma in situ: no invasive malignancy.  Procedure: TRANSURETHRAL RESECTION OF BLADDER TUMOR (TURBT);  Surgeon: Raynelle Bring, MD;  Location: [facility];  Service: Urology;  Laterality: N/A;  **BLADDER BIOPSY*RESECTION**  . TRANSURETHRAL RESECTION OF BLADDER TUMOR N/A 11/17/2016   Path: BENIGN UROTHELIUM WITH INFLAMMATION--no malignancy. Procedure: TRANSURETHRAL RESECTION OF BLADDER TUMOR (TURBT);  Surgeon: Raynelle Bring, MD;  Location: [facility];  Service: Urology;  Laterality: N/A;  . VASECTOMY      Family History  Problem Relation Age of Onset  . Hypertension Mother   . Cancer Father        colon and prostate, mets to lungs.  . Hypertension Father   . Heart disease Brother        d. 4 MI  . Multiple sclerosis Brother   . Diabetes Brother   . Heart disease Paternal Uncle     Social History   Socioeconomic History  . Marital status: Married    Spouse name: Not on file  . Number of children: Not on file  . Years of education: Not on file  . Highest education level: Not on file  Occupational History  . Not on file  Tobacco Use  . Smoking status: Former Smoker    Packs/day: 0.25    Years: 40.00    Pack years: 10.00    Types: Cigarettes    Quit date: 09/05/2015    Years since quitting: 3.9  . Smokeless tobacco: Never Used  Substance and Sexual Activity  . Alcohol use: No    Alcohol/week: 0.0 standard drinks  . Drug use: No  . Sexual activity: Not on file  Other Topics Concern  . Not on file  Social History Narrative    Married, 2 children, 4 grandchildren--[place].   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   Tob: 30 pack-yr hx, quit 08/2011.   No alcohol or drug use.   Active on farm but no formal exercise regimen.   Social Determinants of Health   Financial Resource Strain:   . Difficulty of Paying Living Expenses: Not on file  Food Insecurity:   . Worried About Charity fundraiser in the Last Year: Not on file  . Ran Out of Food in the Last Year: Not on file  Transportation Needs:   . Lack of Transportation (Medical): Not on file  . Lack of Transportation (Non-Medical): Not on file  Physical Activity:   .  Days of Exercise per Week: Not on file  . Minutes of Exercise per Session: Not on file  Stress:   . Feeling of Stress : Not on file  Social Connections:   . Frequency of Communication with Friends and Family: Not on file  . Frequency of Social Gatherings with Friends and Family: Not on file  . Attends Religious Services: Not on file  . Active Member of Clubs or Organizations: Not on file  . Attends Archivist Meetings: Not on file  . Marital Status: Not on file  Intimate Partner Violence:   . Fear of Current or Ex-Partner: Not on file  . Emotionally Abused: Not on file  . Physically Abused: Not on file  . Sexually Abused: Not on file    Outpatient Medications Prior to Visit  Medication Sig Dispense Refill  . acetaminophen (TYLENOL) 500 MG tablet Take 1,000 mg by mouth daily as needed for mild pain or headache.     Marland Kitchen aspirin EC 81 MG tablet Take 81 mg by mouth daily.    Marland Kitchen atenolol (TENORMIN) 50 MG tablet TAKE ONE TABLET BY MOUTH EVERY DAY WITH LUNCH 90 tablet 1  . atorvastatin (LIPITOR) 80 MG tablet TAKE ONE TABLET BY MOUTH EVERY EVENING 90 tablet 0  . calcium carbonate (TUMS - DOSED IN MG ELEMENTAL CALCIUM) 500 MG chewable tablet Chew 1 tablet by mouth daily as  needed for indigestion or heartburn.    . fluticasone (FLONASE) 50 MCG/ACT nasal spray Place 1 spray into both nostrils daily as needed for allergies. 16 g 3  . glipiZIDE (GLUCOTROL XL) 10 MG 24 hr tablet TAKE 1 TABLET (10 MG TOTAL) BY MOUTH DAILY WITH BREAKFAST. 90 tablet 0  . lisinopril (ZESTRIL) 10 MG tablet TAKE 1 TABLET (10 MG TOTAL) BY MOUTH DAILY 90 tablet 1  . metFORMIN (GLUCOPHAGE) 1000 MG tablet TAKE 1 TABLET BY MOUTH TWICE A DAY WITH MEALS 180 tablet 0  . rivaroxaban (XARELTO) 2.5 MG TABS tablet Take 1 tablet (2.5 mg total) by mouth 2 (two) times daily. 60 tablet 2  . sitaGLIPtin (JANUVIA) 100 MG tablet Take 1 tablet (100 mg total) by mouth daily. 90 tablet 1  . tamsulosin (FLOMAX) 0.4 MG CAPS capsule TAKE 1 CAPSULE BY MOUTH EVERY DAY AFTER SUPPER 90 capsule 1  . nitroGLYCERIN (NITROSTAT) 0.4 MG SL tablet Place 1 tablet (0.4 mg total) under the tongue every 5 (five) minutes as needed for chest pain (up to 3 doses). (Patient not taking: Reported on 08/19/2019) 25 tablet 3   No facility-administered medications prior to visit.    No Known Allergies  ROS Review of Systems  Constitutional: Negative for appetite change, chills, fatigue and fever.  HENT: Negative for congestion, dental problem, ear pain and sore throat.   Eyes: Negative for discharge, redness and visual disturbance.  Respiratory: Negative for cough, chest tightness, shortness of breath and wheezing.   Cardiovascular: Negative for chest pain, palpitations and leg swelling.       R leg claudication, no rest pain  Gastrointestinal: Negative for abdominal pain, blood in stool, diarrhea, nausea and vomiting.  Genitourinary: Negative for difficulty urinating, dysuria, flank pain, frequency, hematuria and urgency.  Musculoskeletal: Negative for arthralgias, back pain, joint swelling, myalgias and neck stiffness.  Skin: Negative for pallor and rash.  Neurological: Negative for dizziness, speech difficulty, weakness and  headaches.  Hematological: Negative for adenopathy. Does not bruise/bleed easily.  Psychiatric/Behavioral: Negative for confusion and sleep disturbance. The patient is not nervous/anxious.  PE; Blood pressure 128/80, pulse (!) 56, temperature 97.9 F (36.6 C), temperature source Temporal, resp. rate 16, height 5\' 7"  (1.702 m), weight 191 lb 12.8 oz (87 kg), SpO2 96 %. Body mass index is 30.04 kg/m.  Gen: Alert, well appearing.  Patient is oriented to person, place, time, and situation. AFFECT: pleasant, lucid thought and speech. ENT: Ears: EACs clear, normal epithelium.  TMs with good light reflex and landmarks bilaterally.  Eyes: no injection, icteris, swelling, or exudate.  EOMI, PERRLA. Nose: no drainage or turbinate edema/swelling.  No injection or focal lesion.  Mouth: lips without lesion/swelling.  Oral mucosa pink and moist.  Dentition intact and without obvious caries or gingival swelling.  Oropharynx without erythema, exudate, or swelling.  Neck: supple/nontender.  No LAD, mass, or TM.  Carotid pulses 2+ bilaterally, without bruits. CV: RRR, no m/r/g.   LUNGS: CTA bilat, nonlabored resps, good aeration in all lung fields. ABD: soft, NT, ND, BS normal.  No hepatospenomegaly or mass.  No bruits. EXT: no clubbing, cyanosis, or edema.  Musculoskeletal: no joint swelling, erythema, warmth, or tenderness.  ROM of all joints intact. Skin - no sores or suspicious lesions or rashes or color changes Rectal: deferred (followed by urol) Foot exam - no swelling, tenderness or skin or vascular lesions. Color and temperature is normal. Sensation is intact. DP and PT pulses not palpable on R.  DP and PT normal on L. Toenails are normal.  Pertinent labs:  Lab Results  Component Value Date   TSH 0.96 08/26/2015   Lab Results  Component Value Date   WBC 7.6 06/05/2017   HGB 15.7 06/05/2017   HCT 45.7 06/05/2017   MCV 91.8 06/05/2017   PLT 115.0 (L) 06/05/2017   Lab Results  Component  Value Date   CREATININE 1.02 03/29/2019   BUN 16 03/29/2019   NA 137 03/29/2019   K 4.5 03/29/2019   CL 104 03/29/2019   CO2 25 03/29/2019   Lab Results  Component Value Date   ALT 21 10/04/2018   AST 20 10/04/2018   ALKPHOS 65 10/04/2018   BILITOT 0.7 10/04/2018   Lab Results  Component Value Date   CHOL 88 10/04/2018   Lab Results  Component Value Date   HDL 27.30 (L) 10/04/2018   Lab Results  Component Value Date   LDLCALC 43 10/04/2018   Lab Results  Component Value Date   TRIG 88.0 10/04/2018   Lab Results  Component Value Date   CHOLHDL 3 10/04/2018   Lab Results  Component Value Date   PSA 2.42 03/05/2019   PSA 1.25 06/21/2017   PSA 2.25 01/13/2016   Lab Results  Component Value Date   HGBA1C 7.0 (H) 03/29/2019   ASSESSMENT AND PLAN:   Health maintenance exam: Reviewed age and gender appropriate health maintenance issues (prudent diet, regular exercise, health risks of tobacco and excessive alcohol, use of seatbelts, fire alarms in home, use of sunscreen).  Also reviewed age and gender appropriate health screening as well as vaccine recommendations. Vaccines:  All UTD.  Wants to consider shingrix. Labs: health panel, A1c. Prostate ca screening: gets followed by urologist regularly, has recheck coming up 1-2 wks. Colon ca screening: due for repeat colonoscopy->referral to GI today.  An After Visit Summary was printed and given to the patient.  FOLLOW UP:  No follow-ups on file.  Signed:  Crissie Sickles, MD           08/19/2019

## 2019-08-19 NOTE — Patient Instructions (Signed)

## 2019-08-23 DIAGNOSIS — R972 Elevated prostate specific antigen [PSA]: Secondary | ICD-10-CM | POA: Diagnosis not present

## 2019-08-23 LAB — PSA: PSA: 2.48

## 2019-08-30 DIAGNOSIS — Z8551 Personal history of malignant neoplasm of bladder: Secondary | ICD-10-CM | POA: Diagnosis not present

## 2019-08-30 DIAGNOSIS — R3914 Feeling of incomplete bladder emptying: Secondary | ICD-10-CM | POA: Diagnosis not present

## 2019-08-30 DIAGNOSIS — N401 Enlarged prostate with lower urinary tract symptoms: Secondary | ICD-10-CM | POA: Diagnosis not present

## 2019-08-30 DIAGNOSIS — R972 Elevated prostate specific antigen [PSA]: Secondary | ICD-10-CM | POA: Diagnosis not present

## 2019-09-02 ENCOUNTER — Other Ambulatory Visit: Payer: Self-pay | Admitting: Cardiology

## 2019-09-04 ENCOUNTER — Encounter: Payer: Self-pay | Admitting: Family Medicine

## 2019-09-07 ENCOUNTER — Other Ambulatory Visit: Payer: Self-pay | Admitting: Family Medicine

## 2019-09-16 ENCOUNTER — Other Ambulatory Visit: Payer: Self-pay | Admitting: Family Medicine

## 2019-09-18 ENCOUNTER — Encounter: Payer: Self-pay | Admitting: Family Medicine

## 2019-09-18 ENCOUNTER — Other Ambulatory Visit: Payer: Self-pay | Admitting: Family Medicine

## 2019-09-19 ENCOUNTER — Other Ambulatory Visit: Payer: Self-pay | Admitting: Family Medicine

## 2019-09-27 ENCOUNTER — Ambulatory Visit: Payer: BC Managed Care – PPO | Attending: Internal Medicine

## 2019-09-27 DIAGNOSIS — Z23 Encounter for immunization: Secondary | ICD-10-CM

## 2019-09-27 NOTE — Progress Notes (Signed)
   Covid-19 Vaccination Clinic  Name:  Mark Blankenship    MRN: [id number] DOB: 1955/11/26  09/27/2019  Mark Blankenship was observed post Covid-19 immunization for 15 minutes without incident. He was provided with Vaccine Information Sheet and instruction to access the V-Safe system.   Mark Blankenship was instructed to call 911 with any severe reactions post vaccine: Marland Kitchen Difficulty breathing  . Swelling of face and throat  . A fast heartbeat  . A bad rash all over body  . Dizziness and weakness   Immunizations Administered    Name Date Dose VIS Date Route   Moderna COVID-19 Vaccine 09/27/2019  9:14 AM 0.5 mL 06/25/2019 Intramuscular   Manufacturer: Moderna   Lot: [id number]   [id number]

## 2019-09-30 ENCOUNTER — Other Ambulatory Visit: Payer: Self-pay | Admitting: Cardiovascular Disease

## 2019-09-30 NOTE — Telephone Encounter (Signed)
Pt's age 64, wt 1 kg, SCr 1.08, CrCl 85.03, last ov w/ MA 07/09/19.

## 2019-09-30 NOTE — Telephone Encounter (Signed)
Refill Request.  

## 2019-10-08 ENCOUNTER — Ambulatory Visit: Payer: BC Managed Care – PPO | Admitting: Cardiovascular Disease

## 2019-10-08 ENCOUNTER — Other Ambulatory Visit: Payer: Self-pay

## 2019-10-08 ENCOUNTER — Encounter: Payer: Self-pay | Admitting: Cardiovascular Disease

## 2019-10-08 VITALS — BP 130/70 | HR 73 | Resp 15 | Ht 67.0 in | Wt 192.4 lb

## 2019-10-08 DIAGNOSIS — I2510 Atherosclerotic heart disease of native coronary artery without angina pectoris: Secondary | ICD-10-CM

## 2019-10-08 DIAGNOSIS — I255 Ischemic cardiomyopathy: Secondary | ICD-10-CM

## 2019-10-08 DIAGNOSIS — I739 Peripheral vascular disease, unspecified: Secondary | ICD-10-CM

## 2019-10-08 DIAGNOSIS — E785 Hyperlipidemia, unspecified: Secondary | ICD-10-CM | POA: Diagnosis not present

## 2019-10-08 NOTE — Patient Instructions (Signed)

## 2019-10-08 NOTE — Progress Notes (Signed)
Cardiology Office Note   Date:  10/08/2019   ID:  AKITO LOPERA, DOB 1956-04-29, MRN [id number]  PCP:  Tammi Sou, MD  Cardiologist:   Dr. Stanford Breed  No chief complaint on file.     History of Present Illness: Mark Blankenship is a 64 y.o. male who is here today for follow-up visit regarding peripheral arterial disease.   The patient has known history of coronary artery disease with previous myocardial infarction with an occluded LAD with late presentation, ischemic cardiomyopathy with an EF of 40 to 45%, type 2 diabetes for at least 20 years, essential hypertension and hyperlipidemia.  He is not a smoker but does have strong family history of coronary artery disease.   He was seen last year for severe right calf claudication.  Noninvasive vascular testing showed an ABI of 0.65 on the right and normal on the left.  Duplex showed occluded right popliteal and peroneal arteries.  Given coronary artery disease and peripheral arterial disease, I started him on small dose Xarelto 2.5 mg twice daily and instructed him to start a walking program.  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  He reports gradual improvement in right calf claudication.  He worked hard on Thursday and did notice some right calf discomfort after that.    Past Medical History:  Diagnosis Date  . Arthritis   . Ascending aorta dilatation (HCC)    a. Ascending aorta mildly dilated by echo 09/03/15.  . Bladder cancer [facility]) 2012   Mass removed, then BCG by urologist (Dr. Alinda Money).  Recurrence documented on cysto + bladder washings 10/14/15--getting more maintenance BCG.  Cysto 01/20/16 OK, then more BCG given.  Cysto 04/13/16 c/w just having finished BCG but cytology/FISH ok.  Cysto 07/27/2016 c/w small area of BCG related inflamm vs recurrent carcinoma in situ--washings for cytology done, BCG stopped due to severe sx's.  . Bladder cancer [facility])    Surveillance cysto 11/02/16 cysto showed 1.5 cm raised  erythematous lesion->bx benign. Subsequent cystoscopies unchanged, most recent 07/2019.  Marland Kitchen BPH (benign prostatic hyperplasia)    with hx of acute prostatitis  . CAD (coronary artery disease)    a. '05 MI > DES to circumflex. b. NSTEMI 08/2015 with total occlusion of the mid LAD along with 20% mid-distal Cx, 100% dCX, 40% D1, 50% pCx, 50% m-dRCA, 80% dRCA.Given >12 hours out from symptom onset, medical therapy recommended.  . Diabetes mellitus type 2, controlled ([place])    HbA1c 02/2011 was 6.6%  . Elevated PSA 09/2011   with prostate nodule.  Prostate bx ALL BENIGN 10/10/11 (Dr. Christella Hartigan followed by Urol--improved to 1.18 Jun 2017.  Nodule NOT palpable anymore on DRE at [facility] 01/2018 office f/u. Stable as of 08/2019.  Marland Kitchen GERD (gastroesophageal reflux disease)    occasional  . Headache(784.0)    cluster headaches a long time ago  . History of bronchitis   . History of vertigo   . HTN (hypertension)   . Hyperlipidemia   . Ischemic cardiomyopathy    a. 2D Echo 09/03/15: mild LVH, EF 40-45%, diffuse HK, akinesis of apical myocardium, grade 1 DD, aortic root dimention 22mm, ascending aorta mildly dilated, mildly dilated RV/RA.   Marland Kitchen Myocardial infarction [facility]) 2005; 08/2015   followed by Dr. Stanford Breed  . PAD (peripheral artery disease) ([place]) 06/2019   R LL abnl ABI and doppler->see PSH section of EMR for details.  . Thrombocytopenia ([place])    Stable 2012-2017  .  Tobacco abuse   . Wolf-Parkinson-White syndrome     Past Surgical History:  Procedure Laterality Date  . BLADDER TUMOR EXCISION  11/08/10; 05/2015   urothelial carcinoma (Dr. Alinda Money).  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  Marland Kitchen CARDIAC CATHETERIZATION  2005   with stent placement  . CARDIAC CATHETERIZATION N/A 09/01/2015   Procedure: Left Heart Cath and Coronary Angiography;  Surgeon: Belva Crome, MD;  Location: [place] [facility];  Service: Cardiovascular;  Laterality: N/A;  . COLONOSCOPY  2010    Dr. Isa Rankin, repeat 2020.  Patient wants to go to different GI when time for repeat.  . CYSTOSCOPY W/ RETROGRADES Bilateral 06/22/2015   Procedure: CYSTOSCOPY WITH RETROGRADE PYELOGRAM;  Surgeon: Raynelle Bring, MD;  [facility];  Service: Urology;  Laterality: Bilateral;  . CYSTOSCOPY W/ RETROGRADES N/A 11/17/2016   Procedure: CYSTOSCOPY WITH RETROGRADE PYELOGRAM;  Surgeon: Raynelle Bring, MD;  [facility];  Service: Urology;  Laterality: N/A;  . CYSTOSCOPY WITH RETROGRADE PYELOGRAM, URETEROSCOPY AND STENT PLACEMENT N/A 06/06/2013   Procedure: CYSTOSCOPY WITH RETROGRADE PYELOGRAM;  Surgeon: Dutch Gray, MD;  [facility];  Service: Urology;  Laterality: N/A;  . LE ABI vascular studies  06/2019   R LL claudication + evidence of PAD (ABI very low AND popliteal and peroneal artery occlusion on dopler u/s) in R leg (L leg fine)->referred to Dr. Fletcher Anon.  Conservative mgmt with walking program and low dose xarelto first, then consider revascularization.  Marland Kitchen lexiscan  2012   Normal/negative  . [place] in [place] ([place])-no trouble since last surgery  . myocardial perfusion study  2009   Neg ischemia, EF 52%  . TRANSURETHRAL RESECTION OF BLADDER TUMOR N/A 06/06/2013   Procedure: TRANSURETHRAL RESECTION OF BLADDER TUMOR (TURBT);  Surgeon: Dutch Gray, MD;  [facility];  Service: Urology;  Laterality: N/A;.  PATH: squamous metaplasia, fibrosis with chronic inflammation, no malignancy.  . TRANSURETHRAL RESECTION OF BLADDER TUMOR N/A 06/22/2015   Carcinoma in situ: no invasive malignancy.  Procedure: TRANSURETHRAL RESECTION OF BLADDER TUMOR (TURBT);  Surgeon: Raynelle Bring, MD;  [facility];  Service: Urology;  Laterality: N/A;  **BLADDER BIOPSY*RESECTION**  . TRANSURETHRAL RESECTION OF BLADDER TUMOR N/A 11/17/2016   Path: BENIGN UROTHELIUM WITH INFLAMMATION--no malignancy. Procedure: TRANSURETHRAL RESECTION OF BLADDER TUMOR (TURBT);  Surgeon:  Raynelle Bring, MD;  [facility];  Service: Urology;  Laterality: N/A;  . VASECTOMY       Current Outpatient Medications  Medication Sig Dispense Refill  . acetaminophen (TYLENOL) 500 MG tablet Take 1,000 mg by mouth daily as needed for mild pain or headache.     Marland Kitchen aspirin EC 81 MG tablet Take 81 mg by mouth daily.    Marland Kitchen atenolol (TENORMIN) 50 MG tablet TAKE ONE TABLET BY MOUTH EVERY DAY WITH LUNCH 90 tablet 0  . atorvastatin (LIPITOR) 80 MG tablet TAKE ONE TABLET BY MOUTH EVERY EVENING 90 tablet 2  . calcium carbonate (TUMS - DOSED IN MG ELEMENTAL CALCIUM) 500 MG chewable tablet Chew 1 tablet by mouth daily as needed for indigestion or heartburn.    . fluticasone (FLONASE) 50 MCG/ACT nasal spray Place 1 spray into both nostrils daily as needed for allergies. 16 g 3  . glipiZIDE (GLUCOTROL XL) 10 MG 24 hr tablet TAKE 1 TABLET (10 MG TOTAL) BY MOUTH DAILY WITH BREAKFAST. 90 tablet 0  . JANUVIA 100 MG tablet TAKE 1 TABLET BY MOUTH EVERY  DAY 90 tablet 1  . lisinopril (ZESTRIL) 10 MG tablet TAKE 1 TABLET (10 MG TOTAL) BY MOUTH DAILY 90 tablet 1  . metFORMIN (GLUCOPHAGE) 1000 MG tablet TAKE 1 TABLET BY MOUTH TWICE A DAY WITH MEALS 180 tablet 0  . nitroGLYCERIN (NITROSTAT) 0.4 MG SL tablet Place 1 tablet (0.4 mg total) under the tongue every 5 (five) minutes as needed for chest pain (up to 3 doses). 25 tablet 3  . tamsulosin (FLOMAX) 0.4 MG CAPS capsule TAKE 1 CAPSULE BY MOUTH EVERY DAY AFTER SUPPER 90 capsule 1  . XARELTO 2.5 MG TABS tablet TAKE 1 TABLET BY MOUTH TWICE A DAY 60 tablet 2   No current facility-administered medications for this visit.    Allergies:   Patient has no known allergies.    Social History:  The patient  reports that he quit smoking about 4 years ago. His smoking use included cigarettes. He has a 10.00 pack-year smoking history. He has never used smokeless tobacco. He reports that he does not drink alcohol or use drugs.   Family History:  The patient's family  history includes Cancer in his father; Diabetes in his brother; Heart disease in his brother and paternal uncle; Hypertension in his father and mother; Multiple sclerosis in his brother.    ROS:  Please see the history of present illness.   Otherwise, review of systems are positive for none.   All other systems are reviewed and negative.    PHYSICAL EXAM: VS:  BP 130/70   Pulse 73   Resp 15   Ht 5\' 7"  (1.702 m)   Wt 192 lb 6.4 oz (87.3 kg)   SpO2 97%   BMI 30.13 kg/m  , BMI Body mass index is 30.13 kg/m. GEN: Well nourished, well developed, in no acute distress  HEENT: normal  Neck: no JVD, carotid bruits, or masses Cardiac: RRR; no murmurs, rubs, or gallops,no edema  Respiratory:  clear to auscultation bilaterally, normal work of breathing GI: soft, nontender, nondistended, + BS MS: no deformity or atrophy  Skin: warm and dry, no rash Neuro:  Strength and sensation are intact Psych: euthymic mood, full affect Vascular: Femoral pulses normal bilaterally.  Distal pulses are normal on the left side and not palpable on the right side  EKG:  EKG is not ordered today.    Recent Labs: 08/19/2019: ALT 22; BUN 11; Creatinine, Ser 1.08; Hemoglobin 16.0; Platelets 108.0; Potassium 4.0; Sodium 138    Lipid Panel    Component Value Date/Time   CHOL 88 08/19/2019 1002   TRIG 103.0 08/19/2019 1002   HDL 26.60 (L) 08/19/2019 1002   CHOLHDL 3 08/19/2019 1002   VLDL 20.6 08/19/2019 1002   LDLCALC 41 08/19/2019 1002   LDLDIRECT 87.2 09/03/2010 1110      Wt Readings from Last 3 Encounters:  10/08/19 192 lb 6.4 oz (87.3 kg)  08/19/19 191 lb 12.8 oz (87 kg)  07/09/19 190 lb 9.6 oz (86.5 kg)        No flowsheet data found.    ASSESSMENT AND PLAN:  1.  Peripheral arterial disease with moderate to severe right calf claudication: due to an occluded right popliteal artery.  He reports some improvement in symptoms with a walking program.  He is tolerating low-dose Xarelto with no  bleeding issues.  I reviewed his labs and January which were stable.   He feels that his symptoms have improved enough not to be lifestyle limiting.  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  He will let me know.  2.  Coronary artery disease involving native coronary arteries without angina: Continue medical therapy.  3.  Mild ischemic cardiomyopathy: No signs of volume overload.  4.  Hyperlipidemia: Continue high-dose atorvastatin.  I reviewed his recent labs which showed an LDL of 41.    Disposition:   FU with me in 6 months  Signed,  Kathlyn Sacramento, MD  10/08/2019 9:00 AM    [place]

## 2019-10-21 ENCOUNTER — Encounter: Payer: Self-pay | Admitting: Family Medicine

## 2019-10-21 ENCOUNTER — Other Ambulatory Visit: Payer: Self-pay

## 2019-10-21 MED ORDER — TAMSULOSIN HCL 0.4 MG PO CAPS: ORAL_CAPSULE | ORAL | 1 refills | Status: DC

## 2019-10-30 ENCOUNTER — Ambulatory Visit: Payer: BC Managed Care – PPO | Attending: Internal Medicine

## 2019-10-30 DIAGNOSIS — Z23 Encounter for immunization: Secondary | ICD-10-CM

## 2019-10-30 NOTE — Progress Notes (Signed)
   Covid-19 Vaccination Clinic  Name:  Mark Blankenship    MRN: [id number] DOB: [birth year]  10/30/2019  Mr. [name] was observed post Covid-19 immunization for 15 minutes without incident. He was provided with Vaccine Information Sheet and instruction to access the V-Safe system.   Mr. [name] was instructed to call 911 with any severe reactions post vaccine: Marland Kitchen Difficulty breathing  . Swelling of face and throat  . A fast heartbeat  . A bad rash all over body  . Dizziness and weakness   Immunizations Administered    Name Date Dose VIS Date Route   Moderna COVID-19 Vaccine 10/30/2019  9:28 AM 0.5 mL 06/25/2019 Intramuscular   Manufacturer: Levan Hurst   [id number]   [id number][id number]

## 2019-11-10 ENCOUNTER — Encounter: Payer: Self-pay | Admitting: Family Medicine

## 2019-11-19 ENCOUNTER — Other Ambulatory Visit: Payer: Self-pay

## 2019-11-19 MED ORDER — LISINOPRIL 10 MG PO TABS: ORAL_TABLET | ORAL | 1 refills | Status: DC

## 2019-11-20 ENCOUNTER — Other Ambulatory Visit: Payer: Self-pay

## 2019-11-20 ENCOUNTER — Telehealth (INDEPENDENT_AMBULATORY_CARE_PROVIDER_SITE_OTHER): Payer: BC Managed Care – PPO | Admitting: Family Medicine

## 2019-11-20 ENCOUNTER — Encounter: Payer: Self-pay | Admitting: Family Medicine

## 2019-11-20 DIAGNOSIS — I10 Essential (primary) hypertension: Secondary | ICD-10-CM

## 2019-11-20 DIAGNOSIS — E119 Type 2 diabetes mellitus without complications: Secondary | ICD-10-CM

## 2019-11-20 DIAGNOSIS — E7800 Pure hypercholesterolemia, unspecified: Secondary | ICD-10-CM | POA: Diagnosis not present

## 2019-11-20 NOTE — Progress Notes (Signed)
Virtual Visit via Video Note  I connected with pt on 11/20/19 at 11:00 AM EDT by a video enabled telemedicine application and verified that I am speaking with the correct person using two identifiers.  Location patient: home Location provider:work or home office Persons participating in the virtual visit: patient, provider  I discussed the limitations of evaluation and management by telemedicine and the availability of in person appointments. The patient expressed understanding and agreed to proceed.  Telemedicine visit is a necessity given the COVID-19 restrictions in place at the current time.  HPI: 64 y/o WM being seen today for 3 mo f/u DM 2, HTN, HLD. Doing pretty good.  No home bp monitoring. Diet: working on limiting sweets more, trying to limit Kcal to 2000/day. Walking for exercise, "I walk til I can't walk, about 1 mile, limited by R calf claudication.  Fasting gluc 130 this morning.  Ate later than normal last night. Typical fasting 120-125, no other checks being done. Feet: no burning, tingling, or numbness.  HLD: tolerating statin.  ROS: no fevers, no CP, no SOB, no wheezing, no cough, no dizziness, no HAs, no rashes, no melena/hematochezia.  No polyuria or polydipsia.  No myalgias or arthralgias.  No focal weakness, paresthesias, or tremors.  No acute vision or hearing abnormalities. No n/v/d or abd pain.  No palpitations.     Past Medical History:  Diagnosis Date  . Arthritis   . Ascending aorta dilatation (HCC)    a. Ascending aorta mildly dilated by echo 09/03/15.  . Bladder cancer [facility]) 2012   Mass removed, then BCG by urologist (Dr. Alinda Money).  Recurrence documented on cysto + bladder washings 10/14/15--getting more maintenance BCG.  Cysto 01/20/16 OK, then more BCG given.  Cysto 04/13/16 c/w just having finished BCG but cytology/FISH ok.  Cysto 07/27/2016 c/w small area of BCG related inflamm vs recurrent carcinoma in situ--washings for cytology done, BCG stopped due to  severe sx's.  . Bladder cancer [facility])    Surveillance cysto 11/02/16 cysto showed 1.5 cm raised erythematous lesion->bx benign. Subsequent cystoscopies unchanged, most recent 07/2019.  Marland Kitchen BPH (benign prostatic hyperplasia)    with hx of acute prostatitis  . CAD (coronary artery disease)    a. '05 MI > DES to circumflex. b. NSTEMI 08/2015 with total occlusion of the mid LAD along with 20% mid-distal Cx, 100% dCX, 40% D1, 50% pCx, 50% m-dRCA, 80% dRCA.Given >12 hours out from symptom onset, medical therapy recommended.  . Diabetes mellitus type 2, controlled ([place])    HbA1c 02/2011 was 6.6%  . Elevated PSA 09/2011   with prostate nodule.  Prostate bx ALL BENIGN 10/10/11 (Dr. Christella Hartigan followed by Urol--improved to 1.18 Jun 2017.  Nodule NOT palpable anymore on DRE at [facility] 01/2018 office f/u. Stable as of 08/2019.  Marland Kitchen GERD (gastroesophageal reflux disease)    occasional  . Headache(784.0)    cluster headaches a long time ago  . History of bronchitis   . History of vertigo   . HTN (hypertension)   . Hyperlipidemia   . Ischemic cardiomyopathy    a. 2D Echo 09/03/15: mild LVH, EF 40-45%, diffuse HK, akinesis of apical myocardium, grade 1 DD, aortic root dimention 20mm, ascending aorta mildly dilated, mildly dilated RV/RA.   Marland Kitchen Myocardial infarction [facility]) 2005; 08/2015   followed by Dr. Stanford Breed  . PAD (peripheral artery disease) ([place]) 06/2019   R LL abnl ABI and doppler->see PSH section of EMR for details.  . Thrombocytopenia ([place])    Stable  2012-2017  . Tobacco abuse   . Wolf-Parkinson-White syndrome     Past Surgical History:  Procedure Laterality Date  . BLADDER TUMOR EXCISION  11/08/10; 05/2015   urothelial carcinoma (Dr. Alinda Money).  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  Marland Kitchen CARDIAC CATHETERIZATION  2005   with stent placement  . CARDIAC CATHETERIZATION N/A 09/01/2015   Procedure: Left Heart Cath and Coronary Angiography;  Surgeon: Belva Crome, MD;   Location: [place] [facility];  Service: Cardiovascular;  Laterality: N/A;  . COLONOSCOPY  2010   Dr. Isa Rankin, repeat 2020.  Patient wanted to go to different GI when time for repeat-->[place] [facility] unable to contact pt 2021  . CYSTOSCOPY W/ RETROGRADES Bilateral 06/22/2015   Procedure: CYSTOSCOPY WITH RETROGRADE PYELOGRAM;  Surgeon: Raynelle Bring, MD;  [facility];  Service: Urology;  Laterality: Bilateral;  . CYSTOSCOPY W/ RETROGRADES N/A 11/17/2016   Procedure: CYSTOSCOPY WITH RETROGRADE PYELOGRAM;  Surgeon: Raynelle Bring, MD;  [facility];  Service: Urology;  Laterality: N/A;  . CYSTOSCOPY WITH RETROGRADE PYELOGRAM, URETEROSCOPY AND STENT PLACEMENT N/A 06/06/2013   Procedure: CYSTOSCOPY WITH RETROGRADE PYELOGRAM;  Surgeon: Dutch Gray, MD;  [facility];  Service: Urology;  Laterality: N/A;  . LE ABI vascular studies  06/2019   R LL claudication + evidence of PAD (ABI very low AND popliteal and peroneal artery occlusion on dopler u/s) in R leg (L leg fine)->referred to Dr. Fletcher Anon.  Conservative mgmt with walking program and low dose xarelto first, then consider revascularization.  Marland Kitchen lexiscan  2012   Normal/negative  . [place] in [place] ([place])-no trouble since last surgery  . myocardial perfusion study  2009   Neg ischemia, EF 52%  . TRANSURETHRAL RESECTION OF BLADDER TUMOR N/A 06/06/2013   Procedure: TRANSURETHRAL RESECTION OF BLADDER TUMOR (TURBT);  Surgeon: Dutch Gray, MD;  [facility];  Service: Urology;  Laterality: N/A;.  PATH: squamous metaplasia, fibrosis with chronic inflammation, no malignancy.  . TRANSURETHRAL RESECTION OF BLADDER TUMOR N/A 06/22/2015   Carcinoma in situ: no invasive malignancy.  Procedure: TRANSURETHRAL RESECTION OF BLADDER TUMOR (TURBT);  Surgeon: Raynelle Bring, MD;  [facility];  Service: Urology;  Laterality: N/A;  **BLADDER BIOPSY*RESECTION**  . TRANSURETHRAL RESECTION OF BLADDER TUMOR N/A  11/17/2016   Path: BENIGN UROTHELIUM WITH INFLAMMATION--no malignancy. Procedure: TRANSURETHRAL RESECTION OF BLADDER TUMOR (TURBT);  Surgeon: Raynelle Bring, MD;  [facility];  Service: Urology;  Laterality: N/A;  . VASECTOMY      Family History  Problem Relation Age of Onset  . Hypertension Mother   . Cancer Father        colon and prostate, mets to lungs.  . Hypertension Father   . Heart disease Brother        d. 23 MI  . Multiple sclerosis Brother   . Diabetes Brother   . Heart disease Paternal Uncle      Current Outpatient Medications:  .  aspirin EC 81 MG tablet, Take 81 mg by mouth daily., Disp: , Rfl:  .  atenolol (TENORMIN) 50 MG tablet, TAKE ONE TABLET BY MOUTH EVERY DAY WITH LUNCH, Disp: 90 tablet, Rfl: 0 .  atorvastatin (LIPITOR) 80 MG tablet, TAKE ONE TABLET BY MOUTH EVERY EVENING, Disp: 90 tablet, Rfl: 2 .  calcium carbonate (TUMS - DOSED IN MG ELEMENTAL CALCIUM) 500 MG chewable tablet, Chew 1 tablet by mouth daily as needed for indigestion or heartburn., Disp: ,  Rfl:  .  fluticasone (FLONASE) 50 MCG/ACT nasal spray, Place 1 spray into both nostrils daily as needed for allergies., Disp: 16 g, Rfl: 3 .  glipiZIDE (GLUCOTROL XL) 10 MG 24 hr tablet, TAKE 1 TABLET (10 MG TOTAL) BY MOUTH DAILY WITH BREAKFAST., Disp: 90 tablet, Rfl: 0 .  JANUVIA 100 MG tablet, TAKE 1 TABLET BY MOUTH EVERY DAY, Disp: 90 tablet, Rfl: 1 .  lisinopril (ZESTRIL) 10 MG tablet, TAKE 1 TABLET (10 MG TOTAL) BY MOUTH DAILY, Disp: 90 tablet, Rfl: 1 .  metFORMIN (GLUCOPHAGE) 1000 MG tablet, TAKE 1 TABLET BY MOUTH TWICE A DAY WITH MEALS, Disp: 180 tablet, Rfl: 0 .  tamsulosin (FLOMAX) 0.4 MG CAPS capsule, TAKE 1 CAPSULE BY MOUTH EVERY DAY AFTER SUPPER., Disp: 90 capsule, Rfl: 1 .  XARELTO 2.5 MG TABS tablet, TAKE 1 TABLET BY MOUTH TWICE A DAY, Disp: 60 tablet, Rfl: 2 .  acetaminophen (TYLENOL) 500 MG tablet, Take 1,000 mg by mouth daily as needed for mild pain or headache. , Disp: , Rfl:  .   nitroGLYCERIN (NITROSTAT) 0.4 MG SL tablet, Place 1 tablet (0.4 mg total) under the tongue every 5 (five) minutes as needed for chest pain (up to 3 doses). (Patient not taking: Reported on 11/20/2019), Disp: 25 tablet, Rfl: 3  EXAM:  VITALS per patient if applicable: There were no vitals taken for this visit.   GENERAL: alert, oriented, appears well and in no acute distress  HEENT: atraumatic, conjunttiva clear, no obvious abnormalities on inspection of external nose and ears  NECK: normal movements of the head and neck  LUNGS: on inspection no signs of respiratory distress, breathing rate appears normal, no obvious gross SOB, gasping or wheezing  CV: no obvious cyanosis  MS: moves all visible extremities without noticeable abnormality  PSYCH/NEURO: pleasant and cooperative, no obvious depression or anxiety, speech and thought processing grossly intact  LABS: none today  Lab Results  Component Value Date   TSH 0.96 08/26/2015   Lab Results  Component Value Date   WBC 7.0 08/19/2019   HGB 16.0 08/19/2019   HCT 46.2 08/19/2019   MCV 91.0 08/19/2019   PLT 108.0 (L) 08/19/2019   Lab Results  Component Value Date   CREATININE 1.08 08/19/2019   BUN 11 08/19/2019   NA 138 08/19/2019   K 4.0 08/19/2019   CL 104 08/19/2019   CO2 25 08/19/2019   Lab Results  Component Value Date   ALT 22 08/19/2019   AST 20 08/19/2019   ALKPHOS 63 08/19/2019   BILITOT 0.7 08/19/2019   Lab Results  Component Value Date   CHOL 88 08/19/2019   Lab Results  Component Value Date   HDL 26.60 (L) 08/19/2019   Lab Results  Component Value Date   LDLCALC 41 08/19/2019   Lab Results  Component Value Date   TRIG 103.0 08/19/2019   Lab Results  Component Value Date   CHOLHDL 3 08/19/2019   Lab Results  Component Value Date   PSA 2.48 08/23/2019   PSA 2.42 03/05/2019   PSA 1.25 06/21/2017   Lab Results  Component Value Date   HGBA1C 7.7 (H) 08/19/2019   ASSESSMENT AND  PLAN:  Discussed the following assessment and plan:  1) DM 2: fasting glucoses pretty good. Working on diet some.  Exercise limited due to claudication. Due for HbA1c monitoring--future lab appt needed. BMET as well.  2) HLD: tolerating statin.  LDL 41 three mo ago and hepatic panel normal at  that time. Plan repeat lipids 3 mo.  3) HTN: no home monitoring. Review of bp flowsheet in EMR shows normal bp's for the last year. Lytes/cr future.   I discussed the assessment and treatment plan with the patient. The patient was provided an opportunity to ask questions and all were answered. The patient agreed with the plan and demonstrated an understanding of the instructions.   The patient was advised to call back or seek an in-person evaluation if the symptoms worsen or if the condition fails to improve as anticipated.  F/u: 3 mo  Signed:  Crissie Sickles, MD           11/20/2019

## 2019-12-02 ENCOUNTER — Other Ambulatory Visit: Payer: Self-pay | Admitting: Family Medicine

## 2019-12-08 ENCOUNTER — Other Ambulatory Visit: Payer: Self-pay | Admitting: Family Medicine

## 2019-12-17 NOTE — Progress Notes (Signed)
HPI: FU coronary disease status post drug-eluting stent to the circumflex in August 2005. Patient admitted 2/17 with myocardial infarction. Cardiac catheterization February 2017 showed an occluded distal circumflex. The mid to distal LAD was occluded as well. Ejection fraction 40-50%. Patient was treated medically because of late presentation. Echocardiogram February 2017 showed ejection fraction 40-45% with akinesis of the apical myocardium. Mild right atrial and right ventricular enlargement.  Lower extremity Dopplers December 2020 showed occlusion of the popliteal artery on the right, occlusion of the peroneal artery on the right and no significant focal stenosis on the left; ABI moderate on the right.  Patient seen by Dr. Fletcher Anon and walking program recommended.  If symptoms persisted angiography and intervention could be considered.  Since I last saw him, he had a vague discomfort in his left upper chest this morning but attributes this to planting sweet potatoes yesterday.  He does not have exertional chest pain.  Mild dyspnea on exertion but no orthopnea, PND or pedal edema.  Continues to have some degree of claudication.  Current Outpatient Medications  Medication Sig Dispense Refill   acetaminophen (TYLENOL) 500 MG tablet Take 1,000 mg by mouth daily as needed for mild pain or headache.      aspirin EC 81 MG tablet Take 81 mg by mouth daily.     atenolol (TENORMIN) 50 MG tablet TAKE ONE TABLET BY MOUTH EVERY DAY WITH LUNCH 90 tablet 0   atorvastatin (LIPITOR) 80 MG tablet TAKE ONE TABLET BY MOUTH EVERY EVENING 90 tablet 2   calcium carbonate (TUMS - DOSED IN MG ELEMENTAL CALCIUM) 500 MG chewable tablet Chew 1 tablet by mouth daily as needed for indigestion or heartburn.     fluticasone (FLONASE) 50 MCG/ACT nasal spray Place 1 spray into both nostrils daily as needed for allergies. 16 g 3   glipiZIDE (GLUCOTROL XL) 10 MG 24 hr tablet TAKE 1 TABLET (10 MG TOTAL) BY MOUTH DAILY WITH  BREAKFAST. 90 tablet 0   JANUVIA 100 MG tablet TAKE 1 TABLET BY MOUTH EVERY DAY 90 tablet 1   lisinopril (ZESTRIL) 10 MG tablet TAKE 1 TABLET (10 MG TOTAL) BY MOUTH DAILY 90 tablet 1   metFORMIN (GLUCOPHAGE) 1000 MG tablet TAKE 1 TABLET BY MOUTH TWICE A DAY WITH MEALS 180 tablet 0   nitroGLYCERIN (NITROSTAT) 0.4 MG SL tablet Place 1 tablet (0.4 mg total) under the tongue every 5 (five) minutes as needed for chest pain (up to 3 doses). 25 tablet 3   tamsulosin (FLOMAX) 0.4 MG CAPS capsule TAKE 1 CAPSULE BY MOUTH EVERY DAY AFTER SUPPER. 90 capsule 1   XARELTO 2.5 MG TABS tablet TAKE 1 TABLET BY MOUTH TWICE A DAY 60 tablet 2   No current facility-administered medications for this visit.     Past Medical History:  Diagnosis Date   Arthritis    Ascending aorta dilatation ([place])    a. Ascending aorta mildly dilated by echo 09/03/15.   Bladder cancer [facility]) 2012   Mass removed, then BCG by urologist (Dr. Alinda Money).  Recurrence documented on cysto + bladder washings 10/14/15--getting more maintenance BCG.  Cysto 01/20/16 OK, then more BCG given.  Cysto 04/13/16 c/w just having finished BCG but cytology/FISH ok.  Cysto 07/27/2016 c/w small area of BCG related inflamm vs recurrent carcinoma in situ--washings for cytology done, BCG stopped due to severe sx's.   Bladder cancer ([place])    Surveillance cysto 11/02/16 cysto showed 1.5 cm raised erythematous lesion->bx benign. Subsequent cystoscopies  unchanged, most recent 07/2019.   BPH (benign prostatic hyperplasia)    with hx of acute prostatitis   CAD (coronary artery disease)    a. '05 MI > DES to circumflex. b. NSTEMI 08/2015 with total occlusion of the mid LAD along with 20% mid-distal Cx, 100% dCX, 40% D1, 50% pCx, 50% m-dRCA, 80% dRCA.Given >12 hours out from symptom onset, medical therapy recommended.   Diabetes mellitus type 2, controlled ([place])    HbA1c 02/2011 was 6.6%   Elevated PSA 09/2011   with prostate nodule.  Prostate bx ALL BENIGN  10/10/11 (Dr. Christella Hartigan followed by Urol--improved to 1.18 Jun 2017.  Nodule NOT palpable anymore on DRE at [facility] 01/2018 office f/u. Stable as of 08/2019.   GERD (gastroesophageal reflux disease)    occasional   Headache(784.0)    cluster headaches a long time ago   History of bronchitis    History of vertigo    HTN (hypertension)    Hyperlipidemia    Ischemic cardiomyopathy    a. 2D Echo 09/03/15: mild LVH, EF 40-45%, diffuse HK, akinesis of apical myocardium, grade 1 DD, aortic root dimention 71mm, ascending aorta mildly dilated, mildly dilated RV/RA.    Myocardial infarction [facility]) 2005; 08/2015   followed by Dr. Stanford Breed   PAD (peripheral artery disease) ([place]) 06/2019   R LL abnl ABI and doppler->see PSH section of EMR for details.   Thrombocytopenia ([place])    Stable 2012-2017   Tobacco abuse    Wolf-Parkinson-White syndrome     Past Surgical History:  Procedure Laterality Date   BLADDER TUMOR EXCISION  11/08/10; 05/2015   urothelial carcinoma (Dr. Alinda Money).  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   CARDIAC CATHETERIZATION  2005   with stent placement   CARDIAC CATHETERIZATION N/A 09/01/2015   Procedure: Left Heart Cath and Coronary Angiography;  Surgeon: Belva Crome, MD;  Location: [facility];  Service: Cardiovascular;  Laterality: N/A;   COLONOSCOPY  2010   Dr. Isa Rankin, repeat 2020.  Patient wanted to go to different GI when time for repeat-->[facility] unable to contact pt 2021   CYSTOSCOPY W/ RETROGRADES Bilateral 06/22/2015   Procedure: CYSTOSCOPY WITH RETROGRADE PYELOGRAM;  Surgeon: Raynelle Bring, MD;  [facility];  Service: Urology;  Laterality: Bilateral;   CYSTOSCOPY W/ RETROGRADES N/A 11/17/2016   Procedure: CYSTOSCOPY WITH RETROGRADE PYELOGRAM;  Surgeon: Raynelle Bring, MD;  [facility];  Service: Urology;  Laterality: N/A;   CYSTOSCOPY WITH RETROGRADE PYELOGRAM, URETEROSCOPY AND STENT  PLACEMENT N/A 06/06/2013   Procedure: CYSTOSCOPY WITH RETROGRADE PYELOGRAM;  Surgeon: Dutch Gray, MD;  [facility];  Service: Urology;  Laterality: N/A;   LE ABI vascular studies  06/2019   R LL claudication + evidence of PAD (ABI very low AND popliteal and peroneal artery occlusion on dopler u/s) in R leg (L leg fine)->referred to Dr. Fletcher Anon.  Conservative mgmt with walking program and low dose xarelto first, then consider revascularization.   lexiscan  2012   Normal/negative   LUMBAR SPINE [place] in [place] ([place])-no trouble since last surgery   myocardial perfusion study  2009   Neg ischemia, EF 52%   TRANSURETHRAL RESECTION OF BLADDER TUMOR N/A 06/06/2013   Procedure: TRANSURETHRAL RESECTION OF BLADDER TUMOR (TURBT);  Surgeon: Dutch Gray, MD;  [facility];  Service: Urology;  Laterality: N/A;.  PATH: squamous metaplasia, fibrosis with chronic inflammation, no malignancy.   TRANSURETHRAL RESECTION OF  BLADDER TUMOR N/A 06/22/2015   Carcinoma in situ: no invasive malignancy.  Procedure: TRANSURETHRAL RESECTION OF BLADDER TUMOR (TURBT);  Surgeon: Raynelle Bring, MD;  [facility];  Service: Urology;  Laterality: N/A;  **BLADDER BIOPSY*RESECTION**   TRANSURETHRAL RESECTION OF BLADDER TUMOR N/A 11/17/2016   Path: BENIGN UROTHELIUM WITH INFLAMMATION--no malignancy. Procedure: TRANSURETHRAL RESECTION OF BLADDER TUMOR (TURBT);  Surgeon: Raynelle Bring, MD;  [facility];  Service: Urology;  Laterality: N/A;   VASECTOMY      Social History   Socioeconomic History   Marital status: Married    Spouse name: Not on file   Number of children: Not on file   Years of education: Not on file   Highest education level: Not on file  Occupational History   Not on file  Tobacco Use   Smoking status: Former Smoker    Packs/day: 0.25    Years: 40.00    Pack years: 10.00    Types: Cigarettes    Quit date: 09/05/2015    Years since quitting: 4.3    Smokeless tobacco: Never Used  Substance and Sexual Activity   Alcohol use: No    Alcohol/week: 0.0 standard drinks   Drug use: No   Sexual activity: Not on file  Other Topics Concern   Not on file  Social History Narrative   Married, 2 children, 4 grandchildren--[place].   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   Tob: 30 pack-yr hx, quit 08/2011.   No alcohol or drug use.   Active on farm but no formal exercise regimen.   Social Determinants of Health   Financial Resource Strain:    Difficulty of Paying Living Expenses:   Food Insecurity:    Worried About Charity fundraiser in the Last Year:    Arboriculturist in the Last Year:   Transportation Needs:    Film/video editor (Medical):    Lack of Transportation (Non-Medical):   Physical Activity:    Days of Exercise per Week:    Minutes of Exercise per Session:   Stress:    Feeling of Stress :   Social Connections:    Frequency of Communication with Friends and Family:    Frequency of Social Gatherings with Friends and Family:    Attends Religious Services:    Active Member of Clubs or Organizations:    Attends Music therapist:    Marital Status:   Intimate Partner Violence:    Fear of Current or Ex-Partner:    Emotionally Abused:    Physically Abused:    Sexually Abused:     Family History  Problem Relation Age of Onset   Hypertension Mother    Cancer Father        colon and prostate, mets to lungs.   Hypertension Father    Heart disease Brother        d. 3 MI   Multiple sclerosis Brother    Diabetes Brother    Heart disease Paternal Uncle     ROS: no fevers or chills, productive cough, hemoptysis, dysphasia, odynophagia, melena, hematochezia, dysuria, hematuria, rash, seizure activity, orthopnea, PND, pedal edema, claudication. Remaining systems are  negative.  Physical Exam: Well-developed well-nourished in no acute distress.  Skin is warm and dry.  HEENT is normal.  Neck is supple.  Chest is clear to auscultation with normal expansion.  Cardiovascular exam is regular rate and rhythm.  Abdominal exam nontender or distended. No masses palpated. Extremities show no edema. neuro grossly intact  A/P  1 coronary artery disease- Plan to continue medical therapy with aspirin and statin.  2 hypertension-blood pressure controlled.  Continue present medical regimen.  3 hyperlipidemia-continue statin.  4 ischemic cardiomyopathy-continue ACE inhibitor and beta-blocker.  5 peripheral vascular disease-followed by Dr. Fletcher Anon.  Can consider angiography and intervention in the future if symptoms worsen. Continue xarelto and statin.  We will plan to arrange abdominal ultrasound to exclude aneurysm in December after his 64th birthday.  6 vague chest pain-mild upper left chest pain earlier this a.m. after planting sweet potatoes.  He does not have exertional chest pain.  Electrocardiogram shows no ST changes.  We will follow for now.  Kirk Ruths, MD

## 2019-12-24 ENCOUNTER — Ambulatory Visit: Payer: BC Managed Care – PPO | Admitting: Cardiology

## 2019-12-24 ENCOUNTER — Encounter: Payer: Self-pay | Admitting: Cardiology

## 2019-12-24 ENCOUNTER — Other Ambulatory Visit: Payer: Self-pay

## 2019-12-24 VITALS — BP 125/81 | HR 67 | Temp 95.9°F | Ht 64.0 in | Wt 188.0 lb

## 2019-12-24 DIAGNOSIS — I255 Ischemic cardiomyopathy: Secondary | ICD-10-CM

## 2019-12-24 DIAGNOSIS — Z87891 Personal history of nicotine dependence: Secondary | ICD-10-CM

## 2019-12-24 DIAGNOSIS — I10 Essential (primary) hypertension: Secondary | ICD-10-CM | POA: Diagnosis not present

## 2019-12-24 DIAGNOSIS — E7800 Pure hypercholesterolemia, unspecified: Secondary | ICD-10-CM | POA: Diagnosis not present

## 2019-12-24 DIAGNOSIS — I2510 Atherosclerotic heart disease of native coronary artery without angina pectoris: Secondary | ICD-10-CM | POA: Diagnosis not present

## 2019-12-24 NOTE — Addendum Note (Signed)
Addended by: Cristopher Estimable on: 12/24/2019 01:26 PM   Modules accepted: Orders

## 2019-12-24 NOTE — Patient Instructions (Signed)
Medication Instructions:  NO CHANGE *If you need a refill on your cardiac medications before your next appointment, please call your pharmacy*   Lab Work: If you have labs (blood work) drawn today and your tests are completely normal, you will receive your results only by: Marland Kitchen MyChart Message (if you have MyChart) OR . A paper copy in the mail If you have any lab test that is abnormal or we need to change your treatment, we will call you to review the results.  TESTING: Your physician has requested that you have an abdominal aorta duplex. During this test, an ultrasound is used to evaluate the aorta. Allow 30 minutes for this exam. Do not eat after midnight the day before and avoid carbonated beverages SCHEDULE IN December   Follow-Up: At [facility], you and your health needs are our priority.  As part of our continuing mission to provide you with exceptional heart care, we have created designated Provider Care Teams.  These Care Teams include your primary Cardiologist (physician) and Advanced Practice Providers (APPs -  Physician Assistants and Nurse Practitioners) who all work together to provide you with the care you need, when you need it.  We recommend signing up for the patient portal called "MyChart".  Sign up information is provided on this After Visit Summary.  MyChart is used to connect with patients for Virtual Visits (Telemedicine).  Patients are able to view lab/test results, encounter notes, upcoming appointments, etc.  Non-urgent messages can be sent to your provider as well.   To learn more about what you can do with MyChart, go to [email or web address].    Your next appointment:   6 month(s)  The format for your next appointment:   Either In Person or Virtual  Provider:   Kirk Ruths MD

## 2019-12-28 ENCOUNTER — Other Ambulatory Visit: Payer: Self-pay | Admitting: Cardiovascular Disease

## 2019-12-30 NOTE — Telephone Encounter (Signed)
Pt's age 64, wt 85.3 kg, SCr 1.08, CrCl 83.37, last ov w/ Dr. Stanford Breed 12/24/19, pt on low dose Xarelto per Dr. Fletcher Anon.

## 2019-12-30 NOTE — Telephone Encounter (Signed)
Refill request

## 2020-03-03 ENCOUNTER — Other Ambulatory Visit: Payer: Self-pay | Admitting: Family Medicine

## 2020-03-03 NOTE — Telephone Encounter (Signed)
Medication filled for one month. Patient notified via MyChart of needed appointment for further medication refills.

## 2020-03-03 NOTE — Telephone Encounter (Signed)
Pt called and appointment scheduled for 03/25/20. Rx fill for 90 supply

## 2020-03-10 ENCOUNTER — Other Ambulatory Visit: Payer: Self-pay | Admitting: Family Medicine

## 2020-03-24 ENCOUNTER — Other Ambulatory Visit: Payer: Self-pay | Admitting: Family Medicine

## 2020-03-25 ENCOUNTER — Ambulatory Visit: Payer: BC Managed Care – PPO | Admitting: Family Medicine

## 2020-03-25 ENCOUNTER — Other Ambulatory Visit: Payer: Self-pay

## 2020-03-25 ENCOUNTER — Encounter: Payer: Self-pay | Admitting: Family Medicine

## 2020-03-25 ENCOUNTER — Other Ambulatory Visit: Payer: Self-pay | Admitting: Family Medicine

## 2020-03-25 VITALS — BP 121/83 | HR 62 | Temp 97.7°F | Resp 16 | Ht 64.0 in | Wt 183.4 lb

## 2020-03-25 DIAGNOSIS — I10 Essential (primary) hypertension: Secondary | ICD-10-CM

## 2020-03-25 DIAGNOSIS — Z23 Encounter for immunization: Secondary | ICD-10-CM

## 2020-03-25 DIAGNOSIS — E118 Type 2 diabetes mellitus with unspecified complications: Secondary | ICD-10-CM

## 2020-03-25 DIAGNOSIS — Z0000 Encounter for general adult medical examination without abnormal findings: Secondary | ICD-10-CM

## 2020-03-25 DIAGNOSIS — E7800 Pure hypercholesterolemia, unspecified: Secondary | ICD-10-CM

## 2020-03-25 DIAGNOSIS — Z1211 Encounter for screening for malignant neoplasm of colon: Secondary | ICD-10-CM

## 2020-03-25 DIAGNOSIS — Z7901 Long term (current) use of anticoagulants: Secondary | ICD-10-CM | POA: Diagnosis not present

## 2020-03-25 DIAGNOSIS — I739 Peripheral vascular disease, unspecified: Secondary | ICD-10-CM | POA: Diagnosis not present

## 2020-03-25 LAB — LIPID PANEL
Cholesterol: 96 mg/dL (ref 0–200)
HDL: 27.3 mg/dL — ABNORMAL LOW (ref >39.00)
LDL Cholesterol: 47 mg/dL (ref 0–99)
NonHDL: 68.27
Total CHOL/HDL Ratio: 4
Triglycerides: 104 mg/dL (ref 0.0–149.0)
VLDL: 20.8 mg/dL (ref 0.0–40.0)

## 2020-03-25 LAB — CBC
HCT: 47.6 % (ref 39.0–52.0)
Hemoglobin: 16.2 g/dL (ref 13.0–17.0)
MCHC: 34.1 g/dL (ref 30.0–36.0)
MCV: 92.2 fl (ref 78.0–100.0)
Platelets: 117 10*3/uL — ABNORMAL LOW (ref 150.0–400.0)
RBC: 5.16 Mil/uL (ref 4.22–5.81)
RDW: 13.9 % (ref 11.5–15.5)
WBC: 7.5 10*3/uL (ref 4.0–10.5)

## 2020-03-25 LAB — MICROALBUMIN / CREATININE URINE RATIO
Creatinine,U: 236.8 mg/dL
Microalb Creat Ratio: 11 mg/g (ref 0.0–30.0)
Microalb, Ur: 26 mg/dL — ABNORMAL HIGH (ref 0.0–1.9)

## 2020-03-25 LAB — HEMOGLOBIN A1C: Hgb A1c MFr Bld: 8.3 % — ABNORMAL HIGH (ref 4.6–6.5)

## 2020-03-25 LAB — COMPREHENSIVE METABOLIC PANEL
ALT: 21 U/L (ref 0–53)
AST: 19 U/L (ref 0–37)
Albumin: 4.3 g/dL (ref 3.5–5.2)
Alkaline Phosphatase: 67 U/L (ref 39–117)
BUN: 16 mg/dL (ref 6–23)
CO2: 28 mEq/L (ref 19–32)
Calcium: 9.7 mg/dL (ref 8.4–10.5)
Chloride: 102 mEq/L (ref 96–112)
Creatinine, Ser: 1.17 mg/dL (ref 0.40–1.50)
GFR: 62.6 mL/min (ref >60.00)
Glucose, Bld: 160 mg/dL — ABNORMAL HIGH (ref 70–99)
Potassium: 4.9 mEq/L (ref 3.5–5.1)
Sodium: 136 mEq/L (ref 135–145)
Total Bilirubin: 1 mg/dL (ref 0.2–1.2)
Total Protein: 6.5 g/dL (ref 6.0–8.3)

## 2020-03-25 NOTE — Addendum Note (Signed)
Addended by: Deveron Furlong D on: 03/25/2020 10:51 AM   Modules accepted: Orders

## 2020-03-25 NOTE — Telephone Encounter (Signed)
Pt has appointment today will refill at that time

## 2020-03-25 NOTE — Progress Notes (Signed)
OFFICE VISIT  03/25/2020   CC:  Chief Complaint  Patient presents with  . Follow-up    RCI, pt is fasting   HPI:    Patient is a 64 y.o. Caucasian male who presents for 4 mo f/u DM 2, HTN, HLD. A/P as of last visit: ") DM 2: fasting glucoses pretty good. Working on diet some.  Exercise limited due to claudication. Due for HbA1c monitoring--future lab appt needed. BMET as well.  2) HLD: tolerating statin.  LDL 41 three mo ago and hepatic panel normal at that time. Plan repeat lipids 3 mo.  3) HTN: no home monitoring. Review of bp flowsheet in EMR shows normal bp's for the last year. Lytes/cr future."  INTERIM HX: Feeling fine. He did not come in for the BMET and A1c as we had planned after last video visit. Also of note: pt was not able to be contacted back in jan 2021 to set up [facility] colonoscopy.  He was busy when they called he says, so he told them no thanks. He wants to go ahead and get referral again.  DM: not checking glucoses at home. Drinking lots of water: >65 oz water per day. Tolerating statin well.  Bread and tomatoes are his worst choices regarding carbs.   Minimizes red meat and fried foods. No exercise. Lower legs feeling improved this summer.  No rest pain.  Some days goes 1/4 mile before pain and fatigue in LL's, other days 1/2 that far.  He takes his ASA and xarelto for PAD.  ROS: no fevers, no CP, no SOB, no wheezing, no cough, no dizziness, no HAs, no rashes, no melena/hematochezia.  No polyuria or polydipsia.  No myalgias or arthralgias.  No focal weakness, paresthesias, or tremors.  No acute vision or hearing abnormalities. No n/v/d or abd pain.  No palpitations.     Past Medical History:  Diagnosis Date  . Arthritis   . Ascending aorta dilatation ([facility])    a. Ascending aorta mildly dilated by echo 09/03/15.  . Bladder cancer [facility]) 2012   Mass removed, then BCG by urologist (Dr. Alinda Money).  Recurrence documented on cysto + bladder washings  10/14/15--getting more maintenance BCG.  Cysto 01/20/16 OK, then more BCG given.  Cysto 04/13/16 c/w just having finished BCG but cytology/FISH ok.  Cysto 07/27/2016 c/w small area of BCG related inflamm vs recurrent carcinoma in situ--washings for cytology done, BCG stopped due to severe sx's.  . Bladder cancer [facility])    Surveillance cysto 11/02/16 cysto showed 1.5 cm raised erythematous lesion->bx benign. Subsequent cystoscopies unchanged, most recent 07/2019.  Marland Kitchen BPH (benign prostatic hyperplasia)    with hx of acute prostatitis  . CAD (coronary artery disease)    a. '05 MI > DES to circumflex. b. NSTEMI 08/2015 with total occlusion of the mid LAD along with 20% mid-distal Cx, 100% dCX, 40% D1, 50% pCx, 50% m-dRCA, 80% dRCA.Given >12 hours out from symptom onset, medical therapy recommended.  . Diabetes mellitus type 2, controlled ([place])    HbA1c 02/2011 was 6.6%  . Elevated PSA 09/2011   with prostate nodule.  Prostate bx ALL BENIGN 10/10/11 (Dr. Christella Hartigan followed by Urol--improved to 1.18 Jun 2017.  Nodule NOT palpable anymore on DRE at [facility] 01/2018 office f/u. Stable as of 08/2019.  Marland Kitchen GERD (gastroesophageal reflux disease)    occasional  . Headache(784.0)    cluster headaches a long time ago  . History of bronchitis   . History of vertigo   .  HTN (hypertension)   . Hyperlipidemia   . Ischemic cardiomyopathy    a. 2D Echo 09/03/15: mild LVH, EF 40-45%, diffuse HK, akinesis of apical myocardium, grade 1 DD, aortic root dimention 71mm, ascending aorta mildly dilated, mildly dilated RV/RA.   Marland Kitchen Myocardial infarction [facility]) 2005; 08/2015   followed by Dr. Stanford Breed  . PAD (peripheral artery disease) ([place]) 06/2019   R LL abnl ABI and doppler->see PSH section of EMR for details.  . Thrombocytopenia ([place])    Stable 2012-2017  . Tobacco abuse   . Wolf-Parkinson-White syndrome     Past Surgical History:  Procedure Laterality Date  . BLADDER TUMOR EXCISION  11/08/10; 05/2015   urothelial carcinoma (Dr.  Alinda Money).  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  Marland Kitchen CARDIAC CATHETERIZATION  2005   with stent placement  . CARDIAC CATHETERIZATION N/A 09/01/2015   Procedure: Left Heart Cath and Coronary Angiography;  Surgeon: Belva Crome, MD;  Location: [place] [facility];  Service: Cardiovascular;  Laterality: N/A;  . COLONOSCOPY  2010   Dr. Isa Rankin, repeat 2020.  Patient wanted to go to different GI when time for repeat-->[facility] unable to contact pt 2021  . CYSTOSCOPY W/ RETROGRADES Bilateral 06/22/2015   Procedure: CYSTOSCOPY WITH RETROGRADE PYELOGRAM;  Surgeon: Raynelle Bring, MD;  [facility];  Service: Urology;  Laterality: Bilateral;  . CYSTOSCOPY W/ RETROGRADES N/A 11/17/2016   Procedure: CYSTOSCOPY WITH RETROGRADE PYELOGRAM;  Surgeon: Raynelle Bring, MD;  [facility];  Service: Urology;  Laterality: N/A;  . CYSTOSCOPY WITH RETROGRADE PYELOGRAM, URETEROSCOPY AND STENT PLACEMENT N/A 06/06/2013   Procedure: CYSTOSCOPY WITH RETROGRADE PYELOGRAM;  Surgeon: Dutch Gray, MD;  [facility];  Service: Urology;  Laterality: N/A;  . LE ABI vascular studies  06/2019   R LL claudication + evidence of PAD (ABI very low AND popliteal and peroneal artery occlusion on dopler u/s) in R leg (L leg fine)->referred to Dr. Fletcher Anon.  Conservative mgmt with walking program and low dose xarelto first, then consider revascularization.  Marland Kitchen lexiscan  2012   Normal/negative  . [place] in [place] ([place])-no trouble since last surgery  . myocardial perfusion study  2009   Neg ischemia, EF 52%  . TRANSURETHRAL RESECTION OF BLADDER TUMOR N/A 06/06/2013   Procedure: TRANSURETHRAL RESECTION OF BLADDER TUMOR (TURBT);  Surgeon: Dutch Gray, MD;  [facility];  Service: Urology;  Laterality: N/A;.  PATH: squamous metaplasia, fibrosis with chronic inflammation, no malignancy.  . TRANSURETHRAL RESECTION OF BLADDER TUMOR N/A 06/22/2015    Carcinoma in situ: no invasive malignancy.  Procedure: TRANSURETHRAL RESECTION OF BLADDER TUMOR (TURBT);  Surgeon: Raynelle Bring, MD;  [facility];  Service: Urology;  Laterality: N/A;  **BLADDER BIOPSY*RESECTION**  . TRANSURETHRAL RESECTION OF BLADDER TUMOR N/A 11/17/2016   Path: BENIGN UROTHELIUM WITH INFLAMMATION--no malignancy. Procedure: TRANSURETHRAL RESECTION OF BLADDER TUMOR (TURBT);  Surgeon: Raynelle Bring, MD;  [facility];  Service: Urology;  Laterality: N/A;  . VASECTOMY      Outpatient Medications Prior to Visit  Medication Sig Dispense Refill  . acetaminophen (TYLENOL) 500 MG tablet Take 1,000 mg by mouth daily as needed for mild pain or headache.     Marland Kitchen aspirin EC 81 MG tablet Take 81 mg by mouth daily.    Marland Kitchen atenolol (TENORMIN) 50 MG tablet TAKE ONE TABLET BY MOUTH EVERY DAY WITH LUNCH 90 tablet 0  . atorvastatin (LIPITOR) 80 MG tablet TAKE  ONE TABLET BY MOUTH EVERY EVENING 90 tablet 2  . calcium carbonate (TUMS - DOSED IN MG ELEMENTAL CALCIUM) 500 MG chewable tablet Chew 1 tablet by mouth daily as needed for indigestion or heartburn.    . fluticasone (FLONASE) 50 MCG/ACT nasal spray Place 1 spray into both nostrils daily as needed for allergies. 16 g 3  . glipiZIDE (GLUCOTROL XL) 10 MG 24 hr tablet TAKE 1 TABLET (10 MG TOTAL) BY MOUTH DAILY WITH BREAKFAST. 90 tablet 0  . JANUVIA 100 MG tablet TAKE 1 TABLET BY MOUTH EVERY DAY 90 tablet 1  . lisinopril (ZESTRIL) 10 MG tablet TAKE 1 TABLET (10 MG TOTAL) BY MOUTH DAILY 90 tablet 1  . metFORMIN (GLUCOPHAGE) 1000 MG tablet TAKE 1 TABLET BY MOUTH TWICE A DAY WITH MEALS 180 tablet 0  . tamsulosin (FLOMAX) 0.4 MG CAPS capsule TAKE 1 CAPSULE BY MOUTH EVERY DAY AFTER SUPPER. 90 capsule 1  . XARELTO 2.5 MG TABS tablet TAKE 1 TABLET BY MOUTH TWICE A DAY 60 tablet 6  . nitroGLYCERIN (NITROSTAT) 0.4 MG SL tablet Place 1 tablet (0.4 mg total) under the tongue every 5 (five) minutes as needed for chest pain (up to 3 doses). (Patient not  taking: Reported on 03/25/2020) 25 tablet 3   No facility-administered medications prior to visit.    No Known Allergies  ROS As per HPI  PE: Blood pressure 121/83, pulse 62, temperature 97.7 F (36.5 C), temperature source Oral, resp. rate 16, height 5\' 4"  (1.626 m), weight 183 lb 6.4 oz (83.2 kg), SpO2 97 %. Gen: Alert, well appearing.  Patient is oriented to person, place, time, and situation. AFFECT: pleasant, lucid thought and speech. CV: RRR, no m/r/g.   LUNGS: CTA bilat, nonlabored resps, good aeration in all lung fields. EXT: no clubbing or cyanosis.  no edema.    LABS:  Lab Results  Component Value Date   TSH 0.96 08/26/2015   Lab Results  Component Value Date   WBC 7.0 08/19/2019   HGB 16.0 08/19/2019   HCT 46.2 08/19/2019   MCV 91.0 08/19/2019   PLT 108.0 (L) 08/19/2019   Lab Results  Component Value Date   CREATININE 1.08 08/19/2019   BUN 11 08/19/2019   NA 138 08/19/2019   K 4.0 08/19/2019   CL 104 08/19/2019   CO2 25 08/19/2019   Lab Results  Component Value Date   ALT 22 08/19/2019   AST 20 08/19/2019   ALKPHOS 63 08/19/2019   BILITOT 0.7 08/19/2019   Lab Results  Component Value Date   CHOL 88 08/19/2019   Lab Results  Component Value Date   HDL 26.60 (L) 08/19/2019   Lab Results  Component Value Date   LDLCALC 41 08/19/2019   Lab Results  Component Value Date   TRIG 103.0 08/19/2019   Lab Results  Component Value Date   CHOLHDL 3 08/19/2019   Lab Results  Component Value Date   PSA 2.48 08/23/2019   PSA 2.42 03/05/2019   PSA 1.25 06/21/2017   Lab Results  Component Value Date   HGBA1C 7.7 (H) 08/19/2019   IMPRESSION AND PLAN:  1) DM 2, no home monitoring. Compliant with meds, diet improving some. No cardio exercise but very active. Hba1c and urine microalb/cr today, as well as lytes/cr.  2) HLD: tolerating statin. Working on diet.   LDL at goal 07/2019. FLP and hepatic panel today.  3) Hypertension.  No home  monitoring.  BP normal here today. Lytes/cr today.  4) PAD, on low dose xarelto + ASA daily as per vasc surg rec's. Seems less symptomatic last few months. CBC today.  5) preventative health: flu vaccine today.  He'll get covid 19 booster in near future.  An After Visit Summary was printed and given to the patient.  FOLLOW UP: Return in about 3 months (around 06/24/2020) for routine chronic illness f/u.  Signed:  Crissie Sickles, MD           03/25/2020

## 2020-03-26 ENCOUNTER — Telehealth: Payer: Self-pay | Admitting: Family Medicine

## 2020-03-26 NOTE — Telephone Encounter (Signed)
Patient's call was returned, advised of lab results. Follow up message sent to PCP from result note

## 2020-03-26 NOTE — Telephone Encounter (Signed)
Patient states that he's returning a call to the office. He was unsure of who called him. Please give him a call back at [phone].

## 2020-03-31 ENCOUNTER — Telehealth: Payer: Self-pay | Admitting: Family Medicine

## 2020-03-31 ENCOUNTER — Other Ambulatory Visit: Payer: Self-pay | Admitting: Family Medicine

## 2020-03-31 MED ORDER — OZEMPIC (0.25 OR 0.5 MG/DOSE) 2 MG/1.5ML ~~LOC~~ SOPN: 0.5000 mg | PEN_INJECTOR | SUBCUTANEOUS | 1 refills | Status: DC

## 2020-03-31 NOTE — Telephone Encounter (Signed)
Pls notify pt that the dose of the ozempic is the starting dose and we'll likely increase it in 1 mo. Check glucose fasting every morning and then again TWO hours after a meal (any meal of his choice) and write the numbers down and we'll review them in o/v in 1 month.-thx

## 2020-03-31 NOTE — Telephone Encounter (Signed)
Patient advised of new medication and provider instructions.  Patient voiced understanding.  Scheduled f/u 05/01/20 @ 9am.

## 2020-04-01 ENCOUNTER — Other Ambulatory Visit: Payer: Self-pay | Admitting: Family Medicine

## 2020-04-01 MED ORDER — BLOOD GLUCOSE MONITOR KIT: PACK | 0 refills | Status: DC

## 2020-04-01 NOTE — Addendum Note (Signed)
Addended by: Octaviano Glow on: 04/01/2020 08:36 AM   Modules accepted: Orders

## 2020-04-01 NOTE — Progress Notes (Addendum)
Pt states that he will try the ozempic without januvia. Pt understood that Rx will be sent to pharmacy. Meter sent to pharmacy

## 2020-04-06 ENCOUNTER — Other Ambulatory Visit: Payer: Self-pay

## 2020-04-06 ENCOUNTER — Telehealth: Payer: Self-pay

## 2020-04-06 DIAGNOSIS — E118 Type 2 diabetes mellitus with unspecified complications: Secondary | ICD-10-CM

## 2020-04-06 MED ORDER — BLOOD GLUCOSE MONITOR KIT: PACK | 0 refills | Status: DC

## 2020-04-06 NOTE — Telephone Encounter (Signed)
Spoke with patient, he called the pharmacy earlier and nothing was available. Rx sent again to [facility].

## 2020-04-06 NOTE — Telephone Encounter (Signed)
Patient is checking to see if an Rx for  glucose meter & strips was sent to his pharmacy.

## 2020-04-06 NOTE — Telephone Encounter (Signed)
LM for pt to return call. Glucometer and supplies sent to pharmacy on 9/8.

## 2020-04-14 ENCOUNTER — Other Ambulatory Visit: Payer: Self-pay | Admitting: Family Medicine

## 2020-04-30 ENCOUNTER — Other Ambulatory Visit: Payer: Self-pay

## 2020-05-01 ENCOUNTER — Other Ambulatory Visit: Payer: Self-pay

## 2020-05-01 ENCOUNTER — Ambulatory Visit: Payer: BC Managed Care – PPO | Admitting: Family Medicine

## 2020-05-01 ENCOUNTER — Encounter: Payer: Self-pay | Admitting: Family Medicine

## 2020-05-01 VITALS — BP 117/76 | HR 75 | Temp 97.7°F | Ht 64.0 in | Wt 174.0 lb

## 2020-05-01 DIAGNOSIS — Z23 Encounter for immunization: Secondary | ICD-10-CM | POA: Diagnosis not present

## 2020-05-01 DIAGNOSIS — E119 Type 2 diabetes mellitus without complications: Secondary | ICD-10-CM | POA: Diagnosis not present

## 2020-05-01 MED ORDER — OZEMPIC (0.25 OR 0.5 MG/DOSE) 2 MG/1.5ML ~~LOC~~ SOPN: 0.5000 mg | PEN_INJECTOR | SUBCUTANEOUS | 0 refills | Status: DC

## 2020-05-01 NOTE — Progress Notes (Signed)
OFFICE VISIT  05/01/2020  CC:  Chief Complaint  Patient presents with  . Diabetes    HPI:    Patient is a 64 y.o. Caucasian male who presents for 5 week f/u DM 2. A/P as of last visit: "1) DM 2, no home monitoring. Compliant with meds, diet improving some. No cardio exercise but very active. Hba1c and urine microalb/cr today, as well as lytes/cr.  2) HLD: tolerating statin. Working on diet.   LDL at goal 07/2019. FLP and hepatic panel today.  3) Hypertension.  No home monitoring.  BP normal here today. Lytes/cr today.  4) PAD, on low dose xarelto + ASA daily as per vasc surg rec's. Seems less symptomatic last few months. CBC today.  5) preventative health: flu vaccine today.  He'll get covid 19 booster in near future."  INTERIM HX:  DM: A1c up last visit, opted for trial of ozempic after considering januvia, victoza, GLP2 inh, and insulin. We kept him on metformin and glipizide, also ordered him glucometer/strips. Feeling well.  Some signif nausea initially after starting ozempic but it is signif improved the last week or so.  He has lost 9 lbs since last visit! Eating less carbs and smaller portion sizes. Fasting gluc avg around 100 or so, no hypoglycemia. 2H PP avg 140. 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 Arthritis   . Ascending aorta dilatation (HCC)    a. Ascending aorta mildly dilated by echo 09/03/15.  . Bladder cancer [facility]) 2012   Mass removed, then BCG by urologist (Dr. Alinda Money).  Recurrence documented on cysto + bladder washings 10/14/15--getting more maintenance BCG.  Cysto 01/20/16 OK, then more BCG given.  Cysto 04/13/16 c/w just having finished BCG but cytology/FISH ok.  Cysto 07/27/2016 c/w small area of BCG related inflamm vs recurrent carcinoma in situ--washings for cytology done, BCG stopped due to severe sx's.  . Bladder cancer [facility])    Surveillance cysto 11/02/16 cysto showed 1.5 cm  raised erythematous lesion->bx benign. Subsequent cystoscopies unchanged, most recent 07/2019.  Marland Kitchen BPH (benign prostatic hyperplasia)    with hx of acute prostatitis  . CAD (coronary artery disease)    a. '05 MI > DES to circumflex. b. NSTEMI 08/2015 with total occlusion of the mid LAD along with 20% mid-distal Cx, 100% dCX, 40% D1, 50% pCx, 50% m-dRCA, 80% dRCA.Given >12 hours out from symptom onset, medical therapy recommended.  . Diabetes mellitus type 2, controlled ([place])    HbA1c 02/2011 was 6.6%  . Elevated PSA 09/2011   with prostate nodule.  Prostate bx ALL BENIGN 10/10/11 (Dr. Christella Hartigan followed by Urol--improved to 1.18 Jun 2017.  Nodule NOT palpable anymore on DRE at [facility] 01/2018 office f/u. Stable as of 08/2019.  Marland Kitchen GERD (gastroesophageal reflux disease)    occasional  . Headache(784.0)    cluster headaches a long time ago  . History of bronchitis   . History of vertigo   . HTN (hypertension)   . Hyperlipidemia   . Ischemic cardiomyopathy    a. 2D Echo 09/03/15: mild LVH, EF 40-45%, diffuse HK, akinesis of apical myocardium, grade 1 DD, aortic root dimention 73m, ascending aorta mildly dilated, mildly dilated RV/RA.   .Marland KitchenMyocardial infarction ([facility] 2005; 08/2015   followed by Dr. CStanford Breed . PAD (peripheral artery disease) ([facility] 06/2019   R LL abnl ABI and doppler->see PSH section of EMR for details.  . Thrombocytopenia ([facility]  Stable 2012-2017  . Tobacco abuse   . Wolf-Parkinson-White syndrome     Past Surgical History:  Procedure Laterality Date  . BLADDER TUMOR EXCISION  11/08/10; 05/2015   urothelial carcinoma (Dr. Alinda Money).  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  Marland Kitchen CARDIAC CATHETERIZATION  2005   with stent placement  . CARDIAC CATHETERIZATION N/A 09/01/2015   Procedure: Left Heart Cath and Coronary Angiography;  Surgeon: Belva Crome, MD;  Location: [place] CV [facility];  Service: Cardiovascular;  Laterality: N/A;  . COLONOSCOPY   2010   Dr. Isa Rankin, repeat 2020.  Patient wanted to go to different GI when time for repeat-->[facility] unable to contact pt 2021  . CYSTOSCOPY W/ RETROGRADES Bilateral 06/22/2015   Procedure: CYSTOSCOPY WITH RETROGRADE PYELOGRAM;  Surgeon: Raynelle Bring, MD;  [facility];  Service: Urology;  Laterality: Bilateral;  . CYSTOSCOPY W/ RETROGRADES N/A 11/17/2016   Procedure: CYSTOSCOPY WITH RETROGRADE PYELOGRAM;  Surgeon: Raynelle Bring, MD;  [facility];  Service: Urology;  Laterality: N/A;  . CYSTOSCOPY WITH RETROGRADE PYELOGRAM, URETEROSCOPY AND STENT PLACEMENT N/A 06/06/2013   Procedure: CYSTOSCOPY WITH RETROGRADE PYELOGRAM;  Surgeon: Dutch Gray, MD;  [facility];  Service: Urology;  Laterality: N/A;  . LE ABI vascular studies  06/2019   R LL claudication + evidence of PAD (ABI very low AND popliteal and peroneal artery occlusion on dopler u/s) in R leg (L leg fine)->referred to Dr. Fletcher Anon.  Conservative mgmt with walking program and low dose xarelto first, then consider revascularization.  Marland Kitchen lexiscan  2012   Normal/negative  . [place] in [place] ([place])-no trouble since last surgery  . myocardial perfusion study  2009   Neg ischemia, EF 52%  . TRANSURETHRAL RESECTION OF BLADDER TUMOR N/A 06/06/2013   Procedure: TRANSURETHRAL RESECTION OF BLADDER TUMOR (TURBT);  Surgeon: Dutch Gray, MD;  [facility];  Service: Urology;  Laterality: N/A;.  PATH: squamous metaplasia, fibrosis with chronic inflammation, no malignancy.  . TRANSURETHRAL RESECTION OF BLADDER TUMOR N/A 06/22/2015   Carcinoma in situ: no invasive malignancy.  Procedure: TRANSURETHRAL RESECTION OF BLADDER TUMOR (TURBT);  Surgeon: Raynelle Bring, MD;  [facility];  Service: Urology;  Laterality: N/A;  **BLADDER BIOPSY*RESECTION**  . TRANSURETHRAL RESECTION OF BLADDER TUMOR N/A 11/17/2016   Path: BENIGN UROTHELIUM WITH INFLAMMATION--no malignancy. Procedure: TRANSURETHRAL  RESECTION OF BLADDER TUMOR (TURBT);  Surgeon: Raynelle Bring, MD;  [facility];  Service: Urology;  Laterality: N/A;  . VASECTOMY      Outpatient Medications Prior to Visit  Medication Sig Dispense Refill  . ACCU-CHEK GUIDE test strip 2 (two) times daily.    . Accu-Chek Softclix Lancets lancets 2 (two) times daily.    Marland Kitchen acetaminophen (TYLENOL) 500 MG tablet Take 1,000 mg by mouth daily as needed for mild pain or headache.     Marland Kitchen aspirin EC 81 MG tablet Take 81 mg by mouth daily.    Marland Kitchen atenolol (TENORMIN) 50 MG tablet TAKE ONE TABLET BY MOUTH EVERY DAY WITH LUNCH 90 tablet 0  . atorvastatin (LIPITOR) 80 MG tablet TAKE ONE TABLET BY MOUTH EVERY EVENING 90 tablet 2  . blood glucose meter kit and supplies KIT Dispense based on patient and insurance preference. Use two times daily as directed. 1 each 0  . calcium carbonate (TUMS - DOSED IN MG ELEMENTAL CALCIUM) 500 MG chewable tablet Chew 1 tablet by mouth daily as needed for indigestion or heartburn.    Marland Kitchen  fluticasone (FLONASE) 50 MCG/ACT nasal spray Place 1 spray into both nostrils daily as needed for allergies. 16 g 3  . glipiZIDE (GLUCOTROL XL) 10 MG 24 hr tablet TAKE 1 TABLET (10 MG TOTAL) BY MOUTH DAILY WITH BREAKFAST. 90 tablet 0  . lisinopril (ZESTRIL) 10 MG tablet TAKE 1 TABLET (10 MG TOTAL) BY MOUTH DAILY 90 tablet 1  . metFORMIN (GLUCOPHAGE) 1000 MG tablet TAKE 1 TABLET BY MOUTH TWICE A DAY WITH MEALS 180 tablet 0  . nitroGLYCERIN (NITROSTAT) 0.4 MG SL tablet PLACE 1 TABLET UNDER THE TONGUE EVERY 5 (FIVE) MINUTES AS NEEDED FOR CHEST PAIN (UP TO 3 DOSES). 25 tablet 3  . tamsulosin (FLOMAX) 0.4 MG CAPS capsule TAKE 1 CAPSULE BY MOUTH EVERY DAY AFTER SUPPER. 90 capsule 1  . XARELTO 2.5 MG TABS tablet TAKE 1 TABLET BY MOUTH TWICE A DAY 60 tablet 6  . Semaglutide,0.25 or 0.5MG/DOS, (OZEMPIC, 0.25 OR 0.5 MG/DOSE,) 2 MG/1.5ML SOPN Inject 0.375 mLs (0.5 mg total) into the skin once a week. 1.5 mL 1   No facility-administered medications prior  to visit.    No Known Allergies  ROS As per HPI  PE: Vitals with BMI 05/01/2020 03/25/2020 12/24/2019  Height _0  _1  _2   Weight 174 lbs 183 lbs 6 oz 188 lbs  BMI 29.85 45.40 98.11  Systolic 914 782 956  Diastolic 76 83 81  Pulse 75 62 67     Gen: Alert, well appearing.  Patient is oriented to person, place, time, and situation. AFFECT: pleasant, lucid thought and speech. No further exam today.  LABS:  Lab Results  Component Value Date   HGBA1C 8.3 (H) 03/25/2020   Lab Results  Component Value Date   WBC 7.5 03/25/2020   HGB 16.2 03/25/2020   HCT 47.6 03/25/2020   MCV 92.2 03/25/2020   PLT 117.0 (L) 03/25/2020     Chemistry      Component Value Date/Time   NA 136 03/25/2020 1029   K 4.9 03/25/2020 1029   CL 102 03/25/2020 1029   CO2 28 03/25/2020 1029   BUN 16 03/25/2020 1029   CREATININE 1.17 03/25/2020 1029   GLU 133 04/16/2016 0000      Component Value Date/Time   CALCIUM 9.7 03/25/2020 1029   ALKPHOS 67 03/25/2020 1029   AST 19 03/25/2020 1029   ALT 21 03/25/2020 1029   BILITOT 1.0 03/25/2020 1029     Lab Results  Component Value Date   CHOL 96 03/25/2020   HDL 27.30 (L) 03/25/2020   LDLCALC 47 03/25/2020   LDLDIRECT 87.2 09/03/2010   TRIG 104.0 03/25/2020   CHOLHDL 4 03/25/2020   Lab Results  Component Value Date   TSH 0.96 08/26/2015   Lab Results  Component Value Date   HGBA1C 8.3 (H) 03/25/2020    IMPRESSION AND PLAN:  DM 2, poor control Much improved per glucose readings over the last 1 mo. Continue 0.38m sq q week, metformin 1000 bid, and glipizide xl 144mqd.  Preventative health: Shingrix #1 today.  An After Visit Summary was printed and given to the patient.  FOLLOW UP: Return in about 2 months (around 07/01/2020) for routine chronic illness f/u.  Signed:  PhCrissie SicklesMD           05/01/2020

## 2020-05-17 ENCOUNTER — Other Ambulatory Visit: Payer: Self-pay | Admitting: Family Medicine

## 2020-05-26 ENCOUNTER — Ambulatory Visit (INDEPENDENT_AMBULATORY_CARE_PROVIDER_SITE_OTHER): Payer: BC Managed Care – PPO | Admitting: Cardiovascular Disease

## 2020-05-26 ENCOUNTER — Other Ambulatory Visit: Payer: Self-pay

## 2020-05-26 ENCOUNTER — Encounter: Payer: Self-pay | Admitting: Cardiovascular Disease

## 2020-05-26 VITALS — BP 116/70 | HR 75 | Ht 66.0 in | Wt 174.4 lb

## 2020-05-26 DIAGNOSIS — E7800 Pure hypercholesterolemia, unspecified: Secondary | ICD-10-CM

## 2020-05-26 DIAGNOSIS — I739 Peripheral vascular disease, unspecified: Secondary | ICD-10-CM | POA: Diagnosis not present

## 2020-05-26 DIAGNOSIS — I2510 Atherosclerotic heart disease of native coronary artery without angina pectoris: Secondary | ICD-10-CM

## 2020-05-26 NOTE — Patient Instructions (Signed)
Follow-Up: At [facility], you and your health needs are our priority.  As part of our continuing mission to provide you with exceptional heart care, we have created designated Provider Care Teams.  These Care Teams include your primary Cardiologist (physician) and Advanced Practice Providers (APPs -  Physician Assistants and Nurse Practitioners) who all work together to provide you with the care you need, when you need it.  We recommend signing up for the patient portal called "MyChart".  Sign up information is provided on this After Visit Summary.  MyChart is used to connect with patients for Virtual Visits (Telemedicine).  Patients are able to view lab/test results, encounter notes, upcoming appointments, etc.  Non-urgent messages can be sent to your provider as well.   To learn more about what you can do with MyChart, go to [email or web address].    Your next appointment:   12 month(s)  The format for your next appointment:   In Person  Provider:   M. Fletcher Anon, MD

## 2020-05-26 NOTE — Progress Notes (Signed)
Cardiology Office Note   Date:  05/26/2020   ID:  Mark Blankenship, DOB Apr 07, 1956, MRN [id number]  PCP:  Tammi Sou, MD  Cardiologist:   Dr. Stanford Breed  No chief complaint on file.     History of Present Illness: Mark Blankenship is a 64 y.o. male who is here today for follow-up visit regarding peripheral arterial disease.   The patient has known history of coronary artery disease with previous myocardial infarction with an occluded LAD with late presentation, ischemic cardiomyopathy with an EF of 40 to 45%, type 2 diabetes for at least 20 years, essential hypertension and hyperlipidemia.  He is not a smoker but does have strong family history of coronary artery disease.   He is followed for right calf claudication with moderately reduced ABI due to an occluded popliteal artery. He has been treated medically and his symptoms improved with walking.  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  He continues to take Xarelto 2.5 mg twice daily with no issues.  No chest pain or shortness of breath.  He reports that his symptoms are stable and happen only with overexertion or going incline.   Past Medical History:  Diagnosis Date  . Arthritis   . Ascending aorta dilatation (HCC)    a. Ascending aorta mildly dilated by echo 09/03/15.  . Bladder cancer [facility]) 2012   Mass removed, then BCG by urologist (Dr. Alinda Money).  Recurrence documented on cysto + bladder washings 10/14/15--getting more maintenance BCG.  Cysto 01/20/16 OK, then more BCG given.  Cysto 04/13/16 c/w just having finished BCG but cytology/FISH ok.  Cysto 07/27/2016 c/w small area of BCG related inflamm vs recurrent carcinoma in situ--washings for cytology done, BCG stopped due to severe sx's.  . Bladder cancer [facility])    Surveillance cysto 11/02/16 cysto showed 1.5 cm raised erythematous lesion->bx benign. Subsequent cystoscopies unchanged, most recent 07/2019.  Marland Kitchen BPH (benign prostatic hyperplasia)    with hx of acute prostatitis  . CAD  (coronary artery disease)    a. '05 MI > DES to circumflex. b. NSTEMI 08/2015 with total occlusion of the mid LAD along with 20% mid-distal Cx, 100% dCX, 40% D1, 50% pCx, 50% m-dRCA, 80% dRCA.Given >12 hours out from symptom onset, medical therapy recommended.  . Diabetes mellitus type 2, controlled ([place])    HbA1c 02/2011 was 6.6%  . Elevated PSA 09/2011   with prostate nodule.  Prostate bx ALL BENIGN 10/10/11 (Dr. Christella Hartigan followed by Urol--improved to 1.18 Jun 2017.  Nodule NOT palpable anymore on DRE at [facility] 01/2018 office f/u. Stable as of 08/2019.  Marland Kitchen GERD (gastroesophageal reflux disease)    occasional  . Headache(784.0)    cluster headaches a long time ago  . History of bronchitis   . History of vertigo   . HTN (hypertension)   . Hyperlipidemia   . Ischemic cardiomyopathy    a. 2D Echo 09/03/15: mild LVH, EF 40-45%, diffuse HK, akinesis of apical myocardium, grade 1 DD, aortic root dimention 79m, ascending aorta mildly dilated, mildly dilated RV/RA.   .Marland KitchenMyocardial infarction ([facility]; 08/2015   followed by Dr. CStanford Breed . PAD (peripheral artery disease) ([place] 06/2019   R LL abnl ABI and doppler->see PSH section of EMR for details.  . [place]ocyt[place] ([place]    Stable 2012-2017  . Tobacco abuse   . Wolf-Parkinson-White syndrome     Past Surgical History:  Procedure Laterality Date  . BLADDER TUMOR EXCISION  11/08/10; 05/2015  urothelial carcinoma (Dr. Alinda Money).  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  Marland Kitchen CARDIAC CATHETERIZATION  2005   with stent placement  . CARDIAC CATHETERIZATION N/A 09/01/2015   Procedure: Left Heart Cath and Coronary Angiography;  Surgeon: Belva Crome, MD;  Location: [place] CV LAB;  Service: Cardiovascular;  Laterality: N/A;  . COLONOSCOPY  2010   Dr. Isa Rankin, repeat 2020.  Patient wanted to go to different GI when time for repeat-->[facility] unable to contact pt 2021  . CYSTOSCOPY W/ RETROGRADES  Bilateral 06/22/2015   Procedure: CYSTOSCOPY WITH RETROGRADE PYELOGRAM;  Surgeon: Raynelle Bring, MD;  [facility];  Service: Urology;  Laterality: Bilateral;  . CYSTOSCOPY W/ RETROGRADES N/A 11/17/2016   Procedure: CYSTOSCOPY WITH RETROGRADE PYELOGRAM;  Surgeon: Raynelle Bring, MD;  [facility];  Service: Urology;  Laterality: N/A;  . CYSTOSCOPY WITH RETROGRADE PYELOGRAM, URETEROSCOPY AND STENT PLACEMENT N/A 06/06/2013   Procedure: CYSTOSCOPY WITH RETROGRADE PYELOGRAM;  Surgeon: Dutch Gray, MD;  [facility];  Service: Urology;  Laterality: N/A;  . LE ABI vascular studies  06/2019   R LL claudication + evidence of PAD (ABI very low AND popliteal and peroneal artery occlusion on dopler u/s) in R leg (L leg fine)->referred to Dr. Fletcher Anon.  Conservative mgmt with walking program and low dose xarelto first, then consider revascularization.  Marland Kitchen lexiscan  2012   Normal/negative  . [place] in [place] ([place])-no trouble since last surgery  . myocardial perfusion study  2009   Neg ischemia, EF 52%  . TRANSURETHRAL RESECTION OF BLADDER TUMOR N/A 06/06/2013   Procedure: TRANSURETHRAL RESECTION OF BLADDER TUMOR (TURBT);  Surgeon: Dutch Gray, MD;  [facility];  Service: Urology;  Laterality: N/A;.  PATH: squamous metaplasia, fibrosis with chronic inflammation, no malignancy.  . TRANSURETHRAL RESECTION OF BLADDER TUMOR N/A 06/22/2015   Carcinoma in situ: no invasive malignancy.  Procedure: TRANSURETHRAL RESECTION OF BLADDER TUMOR (TURBT);  Surgeon: Raynelle Bring, MD;  [facility];  Service: Urology;  Laterality: N/A;  **BLADDER BIOPSY*RESECTION**  . TRANSURETHRAL RESECTION OF BLADDER TUMOR N/A 11/17/2016   Path: BENIGN UROTHELIUM WITH INFLAMMATION--no malignancy. Procedure: TRANSURETHRAL RESECTION OF BLADDER TUMOR (TURBT);  Surgeon: Raynelle Bring, MD;  [facility];  Service: Urology;  Laterality: N/A;  . VASECTOMY       Current Outpatient  Medications  Medication Sig Dispense Refill  . ACCU-CHEK GUIDE test strip 2 (two) times daily.    . Accu-Chek Softclix Lancets lancets 2 (two) times daily.    Marland Kitchen acetaminophen (TYLENOL) 500 MG tablet Take 1,000 mg by mouth daily as needed for mild pain or headache.     Marland Kitchen aspirin EC 81 MG tablet Take 81 mg by mouth daily.    Marland Kitchen atenolol (TENORMIN) 50 MG tablet TAKE ONE TABLET BY MOUTH EVERY DAY WITH LUNCH 90 tablet 0  . atorvastatin (LIPITOR) 80 MG tablet TAKE ONE TABLET BY MOUTH EVERY EVENING 90 tablet 2  . blood glucose meter kit and supplies KIT Dispense based on patient and insurance preference. Use two times daily as directed. 1 each 0  . calcium carbonate (TUMS - DOSED IN MG ELEMENTAL CALCIUM) 500 MG chewable tablet Chew 1 tablet by mouth daily as needed for indigestion or heartburn.    . fluticasone (FLONASE) 50 MCG/ACT nasal spray Place 1 spray into both nostrils daily as needed for allergies. 16 g 3  . glipiZIDE (GLUCOTROL XL) 10 MG 24 hr  tablet TAKE 1 TABLET (10 MG TOTAL) BY MOUTH DAILY WITH BREAKFAST. 90 tablet 0  . lisinopril (ZESTRIL) 10 MG tablet TAKE 1 TABLET BY MOUTH EVERY DAY 90 tablet 0  . metFORMIN (GLUCOPHAGE) 1000 MG tablet TAKE 1 TABLET BY MOUTH TWICE A DAY WITH MEALS 180 tablet 0  . nitroGLYCERIN (NITROSTAT) 0.4 MG SL tablet PLACE 1 TABLET UNDER THE TONGUE EVERY 5 (FIVE) MINUTES AS NEEDED FOR CHEST PAIN (UP TO 3 DOSES). 25 tablet 3  . Semaglutide,0.25 or 0.5MG/DOS, (OZEMPIC, 0.25 OR 0.5 MG/DOSE,) 2 MG/1.5ML SOPN Inject 0.5 mg into the skin once a week. 4.5 mL 0  . tamsulosin (FLOMAX) 0.4 MG CAPS capsule TAKE 1 CAPSULE BY MOUTH EVERY DAY AFTER SUPPER. 90 capsule 1  . XARELTO 2.5 MG TABS tablet TAKE 1 TABLET BY MOUTH TWICE A DAY 60 tablet 6   No current facility-administered medications for this visit.    Allergies:   Patient has no known allergies.    Social History:  The patient  reports that he quit smoking about 4 years ago. His smoking use included cigarettes. He has a  10.00 pack-year smoking history. He has never used smokeless tobacco. He reports that he does not drink alcohol and does not use drugs.   Family History:  The patient's family history includes Cancer in his father; Diabetes in his brother; Heart disease in his brother and paternal uncle; Hypertension in his father and mother; Multiple sclerosis in his brother.    ROS:  Please see the history of present illness.   Otherwise, review of systems are positive for none.   All other systems are reviewed and negative.    PHYSICAL EXAM: VS:  BP 116/70   Pulse 75   Ht _0  (1.676 m)   Wt 174 lb 6.4 oz (79.1 kg)   SpO2 97%   BMI 28.15 kg/m  , BMI Body mass index is 28.15 kg/m. GEN: Well nourished, well developed, in no acute distress  HEENT: normal  Neck: no JVD, carotid bruits, or masses Cardiac: RRR; no murmurs, rubs, or gallops,no edema  Respiratory:  clear to auscultation bilaterally, normal work of breathing GI: soft, nontender, nondistended, + BS MS: no deformity or atrophy  Skin: warm and dry, no rash Neuro:  Strength and sensation are intact Psych: euthymic mood, full affect   EKG:  EKG is not ordered today.    Recent Labs: 03/25/2020: ALT 21; BUN 16; Creatinine, Ser 1.17; Hemoglobin 16.2; Platelets 117.0; Potassium 4.9; Sodium 136    Lipid Panel    Component Value Date/Time   CHOL 96 03/25/2020 1029   TRIG 104.0 03/25/2020 1029   HDL 27.30 (L) 03/25/2020 1029   CHOLHDL 4 03/25/2020 1029   VLDL 20.8 03/25/2020 1029   LDLCALC 47 03/25/2020 1029   LDLDIRECT 87.2 09/03/2010 1110      Wt Readings from Last 3 Encounters:  05/26/20 174 lb 6.4 oz (79.1 kg)  05/01/20 174 lb (78.9 kg)  03/25/20 183 lb 6.4 oz (83.2 kg)        No flowsheet data found.    ASSESSMENT AND PLAN:  1.  Peripheral arterial disease with moderate right calf claudication: due to an occluded right popliteal artery.  His symptoms are not lifestyle limiting at the present time and I explained to  him that this is not a good location for endovascular intervention which should be reserved for critical limb ischemia.  Surgical revascularization usually has better outcome in this location.   At the  present time, I think it is best to continue medical therapy and reserve revascularization for worsening symptoms. He is tolerating small dose Xarelto twice daily and I reviewed his labs done in September which were unremarkable.  2.  Coronary artery disease involving native coronary arteries without angina: Continue medical therapy.  3.  Mild ischemic cardiomyopathy: No signs of volume overload.  4.  Hyperlipidemia: Continue high-dose atorvastatin.  I reviewed his recent labs which showed an LDL of 47.    Disposition:   FU with me in 12 months  Signed,  Kathlyn Sacramento, MD  05/26/2020 8:40 AM    [place]

## 2020-05-27 ENCOUNTER — Other Ambulatory Visit: Payer: Self-pay | Admitting: Family Medicine

## 2020-05-28 ENCOUNTER — Other Ambulatory Visit: Payer: Self-pay | Admitting: Cardiology

## 2020-06-09 ENCOUNTER — Other Ambulatory Visit: Payer: Self-pay | Admitting: Family Medicine

## 2020-06-12 ENCOUNTER — Encounter: Payer: Self-pay | Admitting: Gastroenterology

## 2020-06-15 ENCOUNTER — Other Ambulatory Visit: Payer: Self-pay | Admitting: Family Medicine

## 2020-06-23 NOTE — Progress Notes (Signed)
HPI:FU coronary disease status post drug-eluting stent to the circumflex in August 2005. Patient admitted 2/17 with myocardial infarction. Cardiac catheterization February 2017 showed an occluded distal circumflex. The mid to distal LAD was occluded as well. Ejection fraction 40-50%. Patient was treated medically because of late presentation. Echocardiogram February 2017 showed ejection fraction 40-45% with akinesis of the apical myocardium. Mild right atrial and right ventricular enlargement.  Lower extremity Dopplers December 2020 showed occlusion of the popliteal artery on the right, occlusion of the peroneal artery on the right and no significant focal stenosis on the left; ABI moderate on the right.  Patient seen by Dr. Fletcher Anon and walking program recommended.  If symptoms persisted angiography and intervention could be considered.  Since I last saw him,  he denies increased dyspnea, chest pain, palpitations or syncope.  Current Outpatient Medications  Medication Sig Dispense Refill  . ACCU-CHEK GUIDE test strip USE TO TEST BLOOD SUGAR TWICE DAILY 200 strip 0  . Accu-Chek Softclix Lancets lancets 2 (two) times daily.    Marland Kitchen acetaminophen (TYLENOL) 500 MG tablet Take 1,000 mg by mouth daily as needed for mild pain or headache.     Marland Kitchen aspirin EC 81 MG tablet Take 81 mg by mouth daily.    Marland Kitchen atenolol (TENORMIN) 50 MG tablet TAKE ONE TABLET BY MOUTH EVERY DAY WITH LUNCH 90 tablet 0  . atorvastatin (LIPITOR) 80 MG tablet TAKE ONE TABLET BY MOUTH EVERY EVENING 90 tablet 2  . blood glucose meter kit and supplies KIT Dispense based on patient and insurance preference. Use two times daily as directed. 1 each 0  . calcium carbonate (TUMS - DOSED IN MG ELEMENTAL CALCIUM) 500 MG chewable tablet Chew 1 tablet by mouth daily as needed for indigestion or heartburn.    . fluticasone (FLONASE) 50 MCG/ACT nasal spray Place 1 spray into both nostrils daily as needed for allergies. 16 g 3  . glipiZIDE (GLUCOTROL  XL) 10 MG 24 hr tablet TAKE 1 TABLET (10 MG TOTAL) BY MOUTH DAILY WITH BREAKFAST. 90 tablet 1  . lisinopril (ZESTRIL) 10 MG tablet TAKE 1 TABLET BY MOUTH EVERY DAY 90 tablet 0  . metFORMIN (GLUCOPHAGE) 1000 MG tablet TAKE 1 TABLET BY MOUTH TWICE A DAY WITH MEALS 180 tablet 0  . nitroGLYCERIN (NITROSTAT) 0.4 MG SL tablet PLACE 1 TABLET UNDER THE TONGUE EVERY 5 (FIVE) MINUTES AS NEEDED FOR CHEST PAIN (UP TO 3 DOSES). 25 tablet 3  . Semaglutide,0.25 or 0.5MG/DOS, (OZEMPIC, 0.25 OR 0.5 MG/DOSE,) 2 MG/1.5ML SOPN Inject 0.5 mg into the skin once a week. 4.5 mL 1  . tamsulosin (FLOMAX) 0.4 MG CAPS capsule TAKE 1 CAPSULE BY MOUTH EVERY DAY AFTER SUPPER. 90 capsule 1  . XARELTO 2.5 MG TABS tablet TAKE 1 TABLET BY MOUTH TWICE A DAY 60 tablet 6   No current facility-administered medications for this visit.     Past Medical History:  Diagnosis Date  . Arthritis   . Ascending aorta dilatation (HCC)    a. Ascending aorta mildly dilated by echo 09/03/15.  . Bladder cancer [facility]) 2012   Mass removed, then BCG by urologist (Dr. Alinda Money).  Recurrence documented on cysto + bladder washings 10/14/15--getting more maintenance BCG.  Cysto 01/20/16 OK, then more BCG given.  Cysto 04/13/16 c/w just having finished BCG but cytology/FISH ok.  Cysto 07/27/2016 c/w small area of BCG related inflamm vs recurrent carcinoma in situ--washings for cytology done, BCG stopped due to severe sx's.  . Bladder  cancer [facility])    Surveillance cysto 11/02/16 cysto showed 1.5 cm raised erythematous lesion->bx benign. Subsequent cystoscopies unchanged, most recent 07/2019.  Marland Kitchen BPH (benign prostatic hyperplasia)    with hx of acute prostatitis  . CAD (coronary artery disease)    a. '05 MI > DES to circumflex. b. NSTEMI 08/2015 with total occlusion of the mid LAD along with 20% mid-distal Cx, 100% dCX, 40% D1, 50% pCx, 50% m-dRCA, 80% dRCA.Given >12 hours out from symptom onset, medical therapy recommended.  . Diabetes mellitus type 2, controlled  ([place])    HbA1c 02/2011 was 6.6%  . Elevated PSA 09/2011   with prostate nodule.  Prostate bx ALL BENIGN 10/10/11 (Dr. Christella Hartigan followed by Urol--improved to 1.18 Jun 2017.  Nodule NOT palpable anymore on DRE at [facility] 01/2018 office f/u. Stable as of 08/2019.  Marland Kitchen GERD (gastroesophageal reflux disease)    occasional  . Headache(784.0)    cluster headaches a long time ago  . History of bronchitis   . History of vertigo   . HTN (hypertension)   . Hyperlipidemia   . Ischemic cardiomyopathy    a. 2D Echo 09/03/15: mild LVH, EF 40-45%, diffuse HK, akinesis of apical myocardium, grade 1 DD, aortic root dimention 19m, ascending aorta mildly dilated, mildly dilated RV/RA.   .Marland KitchenMyocardial infarction ([facility] 2005; 08/2015   followed by Dr. CStanford Breed . PAD [place]peripheral artery disease) ([place] 06/[place]   R LL abnl ABI and doppler->see PSH section of EMR for details.  . [place]ocyt[place] ([place]    Stable 2012-2017  . Tobacco abuse   . Wolf-Parkinson-White syndrome     Past Surgical History:  Procedure Laterality Date  . BLADDER TUMOR EXCISION  11/08/10; 05/2015   urothelial carcinoma (Dr. BAlinda Money.  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  .Marland KitchenCARDIAC CATHETERIZATION  2005   with stent placement  . CARDIAC CATHETERIZATION N/A 09/01/2015   Procedure: Left Heart Cath and Coronary Angiography;  Surgeon: HBelva Crome MD;  [facility];  Service: Cardiovascular;  Laterality: N/A;  . COLONOSCOPY  2010   Dr. MIsa Rankin repeat 2020.  Patient wanted to go to different GI when time for repeat-->[facility] unable to contact pt 2021  . CYSTOSCOPY W/ RETROGRADES Bilateral 06/22/2015   Procedure: CYSTOSCOPY WITH RETROGRADE PYELOGRAM;  Surgeon: LRaynelle Bring MD;  [facility];  Service: Urology;  Laterality: Bilateral;  . CYSTOSCOPY W/ RETROGRADES N/A 11/17/2016   Procedure: CYSTOSCOPY WITH RETROGRADE PYELOGRAM;  Surgeon: LRaynelle Bring MD;  [facility];   Service: Urology;  Laterality: N/A;  . CYSTOSCOPY WITH RETROGRADE PYELOGRAM, URETEROSCOPY AND STENT PLACEMENT N/A 06/06/2013   Procedure: CYSTOSCOPY WITH RETROGRADE PYELOGRAM;  Surgeon: LDutch Gray MD;  [facility];  Service: Urology;  Laterality: N/A;  . LE ABI vascular studies  06/2019   R LL claudication + evidence of PAD (ABI very low AND popliteal and peroneal artery occlusion on dopler u/s) in R leg (L leg fine)->referred to Dr. AFletcher Anon  Conservative mgmt with walking program and low dose xarelto first, then consider revascularization.  .Marland Kitchenlexiscan  2012   Normal/negative  . [place]no trouble since last surgery  . myocardial perfusion study  2009   Neg ischemia, EF 52%  . TRANSURETHRAL RESECTION OF BLADDER TUMOR N/A 06/06/2013   Procedure: TRANSURETHRAL RESECTION OF BLADDER TUMOR (TURBT);  Surgeon: LDutch Gray MD;  [facility];  Service: Urology;  Laterality: N/A;.  PATH: squamous metaplasia, fibrosis with chronic inflammation, no malignancy.  . TRANSURETHRAL RESECTION OF BLADDER TUMOR N/A 06/22/2015   Carcinoma in situ: no invasive malignancy.  Procedure: TRANSURETHRAL RESECTION OF BLADDER TUMOR (TURBT);  Surgeon: Raynelle Bring, MD;  [facility];  Service: Urology;  Laterality: N/A;  **BLADDER BIOPSY*RESECTION**  . TRANSURETHRAL RESECTION OF BLADDER TUMOR N/A 11/17/2016   Path: BENIGN UROTHELIUM WITH INFLAMMATION--no malignancy. Procedure: TRANSURETHRAL RESECTION OF BLADDER TUMOR (TURBT);  Surgeon: Raynelle Bring, MD;  [facility];  Service: Urology;  Laterality: N/A;  . VASECTOMY      Social History   Socioeconomic History  . Marital status: Married    Spouse name: Not on file  . Number of children: Not on file  . Years of education: Not on file  . Highest education level: Not on file  Occupational History  . Not on file  Tobacco Use  . Smoking status: Former Smoker    Packs/day: 0.25    Years: 40.00      Pack years: 10.00    Types: Cigarettes    Quit date: 09/05/2015    Years since quitting: 4.8  . Smokeless tobacco: Never Used  Vaping Use  . Vaping Use: Never used  Substance and Sexual Activity  . Alcohol use: No    Alcohol/week: 0.0 standard drinks  . Drug use: No  . Sexual activity: Not on file  Other Topics Concern  . Not on file  Social History Narrative   Married, 2 children, 4 grandchildren--[place].   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   Tob: 30 pack-yr hx, quit 08/2011.   No alcohol or drug use.   Active on farm but no formal exercise regimen.   Social Determinants of Health   Financial Resource Strain:   . Difficulty of Paying Living Expenses: Not on file  Food Insecurity:   . Worried About Charity fundraiser in the Last Year: Not on file  . Ran Out of Food in the Last Year: Not on file  Transportation Needs:   . Lack of Transportation (Medical): Not on file  . Lack of Transportation (Non-Medical): Not on file  Physical Activity:   . Days of Exercise per Week: Not on file  . Minutes of Exercise per Session: Not on file  Stress:   . Feeling of Stress : Not on file  Social Connections:   . Frequency of Communication with Friends and Family: Not on file  . Frequency of Social Gatherings with Friends and Family: Not on file  . Attends Religious Services: Not on file  . Active Member of Clubs or Organizations: Not on file  . Attends Archivist Meetings: Not on file  . Marital Status: Not on file  Intimate Partner Violence:   . Fear of Current or Ex-Partner: Not on file  . Emotionally Abused: Not on file  . Physically Abused: Not on file  . Sexually Abused: Not on file    Family History  Problem Relation Age of Onset  . Hypertension Mother   . Cancer Father        colon and prostate, mets to lungs.  . Hypertension Father   .  Heart disease Brother        d. 31 MI  . Multiple sclerosis Brother   . Diabetes Brother   . Heart disease Paternal Uncle  ROS: no fevers or chills, productive cough, hemoptysis, dysphasia, odynophagia, melena, hematochezia, dysuria, hematuria, rash, seizure activity, orthopnea, PND, pedal edema, claudication. Remaining systems are negative.  Physical Exam: Well-developed well-nourished in no acute distress.  Skin is warm and dry.  HEENT is normal.  Neck is supple.  Chest is clear to auscultation with normal expansion.  Cardiovascular exam is regular rate and rhythm.  Abdominal exam nontender or distended. No masses palpated. Extremities show no edema. neuro grossly intact  A/P  1 CAD-patient denies chest pain.  Continue aspirin and statin.  2 hypertension-patient's blood pressure is controlled.  Continue present medications.  3 hyperlipidemia-continue statin.  4 ischemic cardiomyopathy-continue beta-blocker and ACE inhibitor.  5 bruit-schedule abdominal ultrasound to exclude aneurysm.  6 peripheral vascular disease-continue Xarelto and statin.  Followed by Dr. Fletcher Anon.  Kirk Ruths, MD

## 2020-06-24 ENCOUNTER — Encounter (HOSPITAL_COMMUNITY): Payer: BC Managed Care – PPO

## 2020-06-25 ENCOUNTER — Ambulatory Visit (HOSPITAL_COMMUNITY)
Admission: RE | Admit: 2020-06-25 | Discharge: 2020-06-25 | Disposition: A | Discharge status: Home / Self Care | Payer: BC Managed Care – PPO | Source: Ambulatory Visit | Attending: Cardiovascular Disease | Admitting: Cardiovascular Disease

## 2020-06-25 ENCOUNTER — Other Ambulatory Visit: Payer: Self-pay

## 2020-06-25 ENCOUNTER — Encounter: Payer: Self-pay | Admitting: Cardiology

## 2020-06-25 ENCOUNTER — Ambulatory Visit (INDEPENDENT_AMBULATORY_CARE_PROVIDER_SITE_OTHER): Payer: BC Managed Care – PPO | Admitting: Cardiology

## 2020-06-25 VITALS — BP 122/72 | HR 68 | Ht 66.0 in | Wt 168.4 lb

## 2020-06-25 DIAGNOSIS — I2510 Atherosclerotic heart disease of native coronary artery without angina pectoris: Secondary | ICD-10-CM | POA: Diagnosis not present

## 2020-06-25 DIAGNOSIS — I255 Ischemic cardiomyopathy: Secondary | ICD-10-CM | POA: Diagnosis not present

## 2020-06-25 DIAGNOSIS — I739 Peripheral vascular disease, unspecified: Secondary | ICD-10-CM | POA: Diagnosis not present

## 2020-06-25 DIAGNOSIS — Z87891 Personal history of nicotine dependence: Secondary | ICD-10-CM | POA: Insufficient documentation

## 2020-06-25 DIAGNOSIS — I10 Essential (primary) hypertension: Secondary | ICD-10-CM

## 2020-06-25 DIAGNOSIS — E7800 Pure hypercholesterolemia, unspecified: Secondary | ICD-10-CM

## 2020-06-25 NOTE — Patient Instructions (Signed)

## 2020-06-30 ENCOUNTER — Other Ambulatory Visit: Payer: Self-pay

## 2020-06-30 ENCOUNTER — Encounter: Payer: Self-pay | Admitting: Family Medicine

## 2020-06-30 ENCOUNTER — Ambulatory Visit (INDEPENDENT_AMBULATORY_CARE_PROVIDER_SITE_OTHER): Payer: BC Managed Care – PPO | Admitting: Family Medicine

## 2020-06-30 VITALS — BP 108/70 | HR 71 | Temp 97.6°F | Resp 16 | Ht 66.0 in | Wt 169.2 lb

## 2020-06-30 DIAGNOSIS — Z7901 Long term (current) use of anticoagulants: Secondary | ICD-10-CM

## 2020-06-30 DIAGNOSIS — I10 Essential (primary) hypertension: Secondary | ICD-10-CM | POA: Diagnosis not present

## 2020-06-30 DIAGNOSIS — E119 Type 2 diabetes mellitus without complications: Secondary | ICD-10-CM

## 2020-06-30 DIAGNOSIS — E7800 Pure hypercholesterolemia, unspecified: Secondary | ICD-10-CM | POA: Diagnosis not present

## 2020-06-30 LAB — CBC
HCT: 44.4 % (ref 39.0–52.0)
Hemoglobin: 15.5 g/dL (ref 13.0–17.0)
MCHC: 35 g/dL (ref 30.0–36.0)
MCV: 90.2 fl (ref 78.0–100.0)
Platelets: 129 10*3/uL — ABNORMAL LOW (ref 150.0–400.0)
RBC: 4.93 Mil/uL (ref 4.22–5.81)
RDW: 14.3 % (ref 11.5–15.5)
WBC: 7.6 10*3/uL (ref 4.0–10.5)

## 2020-06-30 LAB — BASIC METABOLIC PANEL
BUN: 14 mg/dL (ref 6–23)
CO2: 27 mEq/L (ref 19–32)
Calcium: 9.3 mg/dL (ref 8.4–10.5)
Chloride: 105 mEq/L (ref 96–112)
Creatinine, Ser: 0.94 mg/dL (ref 0.40–1.50)
GFR: 85.33 mL/min (ref >60.00)
Glucose, Bld: 93 mg/dL (ref 70–99)
Potassium: 4 mEq/L (ref 3.5–5.1)
Sodium: 138 mEq/L (ref 135–145)

## 2020-06-30 LAB — HEMOGLOBIN A1C: Hgb A1c MFr Bld: 6.1 % (ref 4.6–6.5)

## 2020-06-30 MED ORDER — ACCU-CHEK SOFTCLIX LANCETS MISC: 0 refills | Status: DC

## 2020-06-30 NOTE — Progress Notes (Signed)
OFFICE VISIT  06/30/2020  CC:  Chief Complaint  Patient presents with  . Follow-up    RCI, pt is fasting   HPI:    Patient is a 64 y.o. Caucasian male who presents for 3 mo f/u DM 2, HTN, HLD. A/P as of last visit: "1) DM 2, no home monitoring. Compliant with meds, diet improving some. No cardio exercise but very active. Hba1c and urine microalb/cr today, as well as lytes/cr.  2) HLD: tolerating statin. Working on diet.   LDL at goal 07/2019. FLP and hepatic panel today.  3) Hypertension.  No home monitoring.  BP normal here today. Lytes/cr today.  4) PAD, on low dose xarelto + ASA daily as per vasc surg rec's. Seems less symptomatic last few months. CBC today.  5) preventative health: flu vaccine today.  He'll get covid 19 booster in near future."  INTERIM HX: A1c up to 8.3% last visit.  He stopped [place] b/c it was increasing his appetite.  He was agreeable to trial of ozempic (but declined trial of victoza) to take along with his glipizide and metformin.  DOING GREAT. Signif dietary changes, tolerating full dose ozempic + glip and metf. Fasting gluc's avg 90s or so, 2H after supper avg low 100s, lowest gluc around 70 and minimal hypoglyc sx's.   No home bp monitoring but compliant with atenolol and lisinopril. HLD: tolerating statin, good recent dietary mod.  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  ROS: no fevers, no CP, no SOB, no wheezing, no cough, no dizziness, no HAs, no rashes, no melena/hematochezia.  No polyuria or polydipsia.  No myalgias or arthralgias.  No focal weakness, paresthesias, or tremors.  No acute vision or hearing abnormalities. No n/v/d or abd pain.  No palpitations.    Past Medical History:  Diagnosis Date  . Arthritis   . Ascending aorta dilatation (HCC)    a. Ascending aorta mildly dilated by echo 09/03/15.  . Bladder cancer [facility]) 2012   Mass removed, then BCG by urologist (Dr. Alinda Money).   Recurrence documented on cysto + bladder washings 10/14/15--getting more maintenance BCG.  Cysto 01/20/16 OK, then more BCG given.  Cysto 04/13/16 c/w just having finished BCG but cytology/FISH ok.  Cysto 07/27/2016 c/w small area of BCG related inflamm vs recurrent carcinoma in situ--washings for cytology done, BCG stopped due to severe sx's.  . Bladder cancer [facility])    Surveillance cysto 11/02/16 cysto showed 1.5 cm raised erythematous lesion->bx benign. Subsequent cystoscopies unchanged, most recent 07/2019.  Marland Kitchen BPH (benign prostatic hyperplasia)    with hx of acute prostatitis  . CAD (coronary artery disease)    a. '05 MI > DES to circumflex. b. NSTEMI 08/2015 with total occlusion of the mid LAD along with 20% mid-distal Cx, 100% dCX, 40% D1, 50% pCx, 50% m-dRCA, 80% dRCA.Given >12 hours out from symptom onset, medical therapy recommended.  . Diabetes mellitus type 2, controlled ([place])    HbA1c 02/2011 was 6.6%  . Elevated PSA 09/2011   with prostate nodule.  Prostate bx ALL BENIGN 10/10/11 (Dr. Christella Hartigan followed by Urol--improved to 1.18 Jun 2017.  Nodule NOT palpable anymore on DRE at [facility] 01/2018 office f/u. Stable as of 08/2019.  Marland Kitchen GERD (gastroesophageal reflux disease)    occasional  . Headache(784.0)    cluster headaches a long time ago  . History of bronchitis   . History of vertigo   . HTN (hypertension)   . Hyperlipidemia   .  Ischemic cardiomyopathy    a. 2D Echo 09/03/15: mild LVH, EF 40-45%, diffuse HK, akinesis of apical myocardium, grade 1 DD, aortic root dimention 25m, ascending aorta mildly dilated, mildly dilated RV/RA.   .Marland KitchenMyocardial infarction ([facility] 2005; 08/2015   followed by Dr. CStanford Breed . PAD [place]peripheral artery disease) ([place] 06/2019   R LL abnl ABI and doppler->see PSH section of EMR for details.  . Thrombocytopenia ([place]    Stable 2012-2017  . Tobacco abuse   . Wolf-Parkinson-White syndrome     Past Surgical History:  Procedure Laterality Date  . BLADDER TUMOR EXCISION   11/08/10; 05/2015   urothelial carcinoma (Dr. BAlinda Money.  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  .Marland KitchenCARDIAC CATHETERIZATION  2005   with stent placement  . CARDIAC CATHETERIZATION N/A 09/01/2015   Procedure: Left Heart Cath and Coronary Angiography;  Surgeon: HBelva Crome MD;  [facility];  Service: Cardiovascular;  Laterality: N/A;  . COLONOSCOPY  2010   Dr. MIsa Rankin repeat 2020.  Patient wanted to go to different GI when time for repeat-->[facility] unable to contact pt 2021  . CYSTOSCOPY W/ RETROGRADES Bilateral 06/22/2015   Procedure: CYSTOSCOPY WITH RETROGRADE PYELOGRAM;  Surgeon: LRaynelle Bring MD;  [facility];  Service: Urology;  Laterality: Bilateral;  . CYSTOSCOPY W/ RETROGRADES N/A 11/17/2016   Procedure: CYSTOSCOPY WITH RETROGRADE PYELOGRAM;  Surgeon: LRaynelle Bring MD;  [facility];  Service: Urology;  Laterality: N/A;  . CYSTOSCOPY WITH RETROGRADE PYELOGRAM, URETEROSCOPY AND STENT PLACEMENT N/A 06/06/2013   Procedure: CYSTOSCOPY WITH RETROGRADE PYELOGRAM;  Surgeon: LDutch Gray MD;  [facility];  Service: Urology;  Laterality: N/A;  . LE ABI vascular studies  06/2019   R LL claudication + evidence of PAD (ABI very low AND popliteal and peroneal artery occlusion on dopler u/s) in R leg (L leg fine)->referred to Dr. AFletcher Anon  Conservative mgmt with walking program and low dose xarelto first, then consider revascularization.  .Marland Kitchenlexiscan  2012   Normal/negative  . [place]no trouble since last surgery  . myocardial perfusion study  2009   Neg ischemia, EF 52%  . TRANSURETHRAL RESECTION OF BLADDER TUMOR N/A 06/06/2013   Procedure: TRANSURETHRAL RESECTION OF BLADDER TUMOR (TURBT);  Surgeon: LDutch Gray MD;  [facility];  Service: Urology;  Laterality: N/A;.  PATH: squamous metaplasia, fibrosis with chronic inflammation, no malignancy.  .  TRANSURETHRAL RESECTION OF BLADDER TUMOR N/A 06/22/2015   Carcinoma in situ: no invasive malignancy.  Procedure: TRANSURETHRAL RESECTION OF BLADDER TUMOR (TURBT);  Surgeon: LRaynelle Bring MD;  [facility];  Service: Urology;  Laterality: N/A;  **BLADDER BIOPSY*RESECTION**  . TRANSURETHRAL RESECTION OF BLADDER TUMOR N/A 11/17/2016   Path: BENIGN UROTHELIUM WITH INFLAMMATION--no malignancy. Procedure: TRANSURETHRAL RESECTION OF BLADDER TUMOR (TURBT);  Surgeon: LRaynelle Bring MD;  [facility];  Service: Urology;  Laterality: N/A;  . VASECTOMY      Outpatient Medications Prior to Visit  Medication Sig Dispense Refill  . ACCU-CHEK GUIDE test strip USE TO TEST BLOOD SUGAR TWICE DAILY 200 strip 0  . Accu-Chek Softclix Lancets lancets 2 (two) times daily.    .Marland Kitchenaspirin EC 81 MG tablet Take 81 mg by mouth daily.    .Marland Kitchenatenolol (TENORMIN) 50 MG tablet TAKE ONE TABLET BY MOUTH EVERY DAY WITH LUNCH 90 tablet 0  . atorvastatin (LIPITOR) 80 MG tablet TAKE ONE TABLET BY MOUTH  EVERY EVENING 90 tablet 2  . blood glucose meter kit and supplies KIT Dispense based on patient and insurance preference. Use two times daily as directed. 1 each 0  . calcium carbonate (TUMS - DOSED IN MG ELEMENTAL CALCIUM) 500 MG chewable tablet Chew 1 tablet by mouth daily as needed for indigestion or heartburn.    Marland Kitchen glipiZIDE (GLUCOTROL XL) 10 MG 24 hr tablet TAKE 1 TABLET (10 MG TOTAL) BY MOUTH DAILY WITH BREAKFAST. 90 tablet 1  . lisinopril (ZESTRIL) 10 MG tablet TAKE 1 TABLET BY MOUTH EVERY DAY 90 tablet 0  . metFORMIN (GLUCOPHAGE) 1000 MG tablet TAKE 1 TABLET BY MOUTH TWICE A DAY WITH MEALS 180 tablet 0  . Semaglutide,0.25 or 0.5MG/DOS, (OZEMPIC, 0.25 OR 0.5 MG/DOSE,) 2 MG/1.5ML SOPN Inject 0.5 mg into the skin once a week. 4.5 mL 1  . tamsulosin (FLOMAX) 0.4 MG CAPS capsule TAKE 1 CAPSULE BY MOUTH EVERY DAY AFTER SUPPER. 90 capsule 1  . XARELTO 2.5 MG TABS tablet TAKE 1 TABLET BY MOUTH TWICE A DAY 60 tablet 6  .  acetaminophen (TYLENOL) 500 MG tablet Take 1,000 mg by mouth daily as needed for mild pain or headache.  (Patient not taking: Reported on 06/30/2020)    . fluticasone (FLONASE) 50 MCG/ACT nasal spray Place 1 spray into both nostrils daily as needed for allergies. (Patient not taking: Reported on 06/30/2020) 16 g 3  . nitroGLYCERIN (NITROSTAT) 0.4 MG SL tablet PLACE 1 TABLET UNDER THE TONGUE EVERY 5 (FIVE) MINUTES AS NEEDED FOR CHEST PAIN (UP TO 3 DOSES). (Patient not taking: Reported on 06/30/2020) 25 tablet 3   No facility-administered medications prior to visit.    No Known Allergies  ROS As per HPI  PE: Vitals with BMI 06/30/2020 06/25/2020 05/26/2020  Height 5' 6" 5' 6" 5' 6"  Weight 169 lbs 3 oz 168 lbs 6 oz 174 lbs 6 oz  BMI 27.32 86.57 84.69  Systolic 629 528 413  Diastolic 70 72 70  Pulse 71 68 75     Gen: Alert, well appearing.  Patient is oriented to person, place, time, and situation. AFFECT: pleasant, lucid thought and speech. CV: RRR, no m/r/g.   LUNGS: CTA bilat, nonlabored resps, good aeration in all lung fields. EXT: no clubbing or cyanosis.  no edema.    LABS:  Lab Results  Component Value Date   TSH 0.96 08/26/2015   Lab Results  Component Value Date   WBC 7.5 03/25/2020   HGB 16.2 03/25/2020   HCT 47.6 03/25/2020   MCV 92.2 03/25/2020   PLT 117.0 (L) 03/25/2020   Lab Results  Component Value Date   CREATININE 1.17 03/25/2020   BUN 16 03/25/2020   NA 136 03/25/2020   K 4.9 03/25/2020   CL 102 03/25/2020   CO2 28 03/25/2020   Lab Results  Component Value Date   ALT 21 03/25/2020   AST 19 03/25/2020   ALKPHOS 67 03/25/2020   BILITOT 1.0 03/25/2020   Lab Results  Component Value Date   CHOL 96 03/25/2020   Lab Results  Component Value Date   HDL 27.30 (L) 03/25/2020   Lab Results  Component Value Date   LDLCALC 47 03/25/2020   Lab Results  Component Value Date   TRIG 104.0 03/25/2020   Lab Results  Component Value Date   CHOLHDL 4  03/25/2020   Lab Results  Component Value Date   PSA 2.48 08/23/2019   PSA 2.42 03/05/2019   PSA 1.25  06/21/2017   Lab Results  Component Value Date   HGBA1C 8.3 (H) 03/25/2020   IMPRESSION AND PLAN:  1) DM 2, doing well last 3 mo-->great diet changes, tolerating latest med addition (ozempic). Continue ozempic 0.54m q week SQ, metformin 1000 mg bid, and glipizide xl 126mqd. Hba1c and bmet today. Cont checking gluc bid.  2) HTN: stable. Cont atenolol 5064md and lisinopril 86m59m. BMET today.  3) HLD: tolerating atorva 80mg81m LDL 47 and hepatic panel normal 3 months ago. Plan repeat 3 mo.  4) Chronic anticoag (low dose xarelto) for PAD, per vasc MD. No overt signs of bleeding. CBC monitoring today.  An After Visit Summary was printed and given to the patient.  FOLLOW UP: Return in about 3 months (around 09/28/2020) for annual CPE (fasting) + RCI.   Signed:  Phil Crissie Sickles          06/30/2020

## 2020-07-03 ENCOUNTER — Encounter: Payer: Self-pay | Admitting: Family Medicine

## 2020-07-21 ENCOUNTER — Encounter: Payer: Self-pay | Admitting: Family Medicine

## 2020-07-21 ENCOUNTER — Other Ambulatory Visit: Payer: Self-pay | Admitting: Internal Medicine

## 2020-07-21 ENCOUNTER — Telehealth: Payer: Self-pay | Admitting: Family Medicine

## 2020-07-21 MED ORDER — OZEMPIC (0.25 OR 0.5 MG/DOSE) 2 MG/1.5ML ~~LOC~~ SOPN: PEN_INJECTOR | SUBCUTANEOUS | 6 refills | Status: DC

## 2020-07-21 MED ORDER — XARELTO 2.5 MG PO TABS: 2.5000 mg | ORAL_TABLET | Freq: Two times a day (BID) | ORAL | 6 refills | Status: DC

## 2020-07-21 NOTE — Telephone Encounter (Signed)
29m, 169 lb 3.2 oz (76.7 kg), 0.94 06/30/20. ccr 85, lovw/crenshaw 06/25/20, PAD, refill is for xarelto 2.5mg  and I am unable to refill those I will route to pharmd pool for further clarification

## 2020-07-21 NOTE — Telephone Encounter (Signed)
OK, rx printed and placed on your keyboard.

## 2020-07-21 NOTE — Telephone Encounter (Signed)
*  STAT* If patient is at the pharmacy, call can be transferred to refill team.   1. Which medications need to be refilled? (please list name of each medication and dose if known) XARELTO 2.5 MG TABS tablet  2. Which pharmacy/location (including street and city if local pharmacy) is medication to be sent to? [facility] - [place], [place] - [address]  3. Do they need a 30 day or 90 day supply? 90

## 2020-07-21 NOTE — Telephone Encounter (Signed)
Please advise 

## 2020-07-21 NOTE — Telephone Encounter (Signed)
Faxed to pharmacy

## 2020-07-21 NOTE — Telephone Encounter (Signed)
CrCL appropriate for PAD Xarelto 2.5mg  BID

## 2020-07-21 NOTE — Telephone Encounter (Signed)
Patient requesting written prescription for Ozempic due to insurance, 90 day supply. Please call patient when ready to pick up.

## 2020-07-29 ENCOUNTER — Ambulatory Visit: Payer: Medicare Other | Admitting: Gastroenterology

## 2020-07-29 ENCOUNTER — Encounter: Payer: Self-pay | Admitting: Gastroenterology

## 2020-07-29 ENCOUNTER — Telehealth: Payer: Self-pay

## 2020-07-29 VITALS — BP 128/64 | HR 70 | Ht 66.0 in | Wt 166.0 lb

## 2020-07-29 DIAGNOSIS — Z1211 Encounter for screening for malignant neoplasm of colon: Secondary | ICD-10-CM

## 2020-07-29 NOTE — Progress Notes (Signed)
HPI: This is a very pleasant 65 year old man who was referred to me by McGowen, Adrian Blackwater, MD  to evaluate colon cancer screening.    He is on a blood thinner Xarelto, according to recent cardiology note that is because of his peripheral vascular disease.  He has ischemic cardiomyopathy with an ejection fraction between 40 and 45% based on echocardiogram in 2017.  Since starting a new medicine, Ozempic, he has noticed erratic bowel habits alternating constipation and loose stools.  In the past week this has evened out however he is getting another shot soon.  He has no problems with abdominal pain, nausea or vomiting  Prior to this medicine he really has never had much issue with his bowels.  He never sees blood in his stool.  He believes his father was diagnosed with colon cancer in his 27s.  He has lost about 20 pounds in the past 6 or 8 months with significant intention, diet changes  Old Data Reviewed: He underwent a colonoscopy with Dr. Juanita Craver in 2010 for routine colon cancer screening.  She documented that she found 4 polyps.  2 were ablated with snare and into removed via biopsy forceps.  She also noted small internal hemorrhoids and a "prominent cecal fold" which was biopsied I do not have any of the pathology reports from that procedure.  Blood work December 2021 shows normal CBC except for platelets of 129.  These are chronically low, in that range since at least the past for 5 years    Review of systems: Pertinent positive and negative review of systems were noted in the above HPI section. All other review negative.   Past Medical History:  Diagnosis Date  . Arthritis   . Ascending aorta dilatation (HCC)    a. Ascending aorta mildly dilated by echo 09/03/15.  . Bladder cancer [facility]) 2012   Mass removed, then BCG by urologist (Dr. Alinda Money).  Recurrence documented on cysto + bladder washings 10/14/15--getting more maintenance BCG.  Cysto 01/20/16 OK, then more BCG given.   Cysto 04/13/16 c/w just having finished BCG but cytology/FISH ok.  Cysto 07/27/2016 c/w small area of BCG related inflamm vs recurrent carcinoma in situ--washings for cytology done, BCG stopped due to severe sx's.  . Bladder cancer [facility])    Surveillance cysto 11/02/16 cysto showed 1.5 cm raised erythematous lesion->bx benign. Subsequent cystoscopies unchanged, most recent 07/2019.  Marland Kitchen BPH (benign prostatic hyperplasia)    with hx of acute prostatitis  . CAD (coronary artery disease)    a. '05 MI > DES to circumflex. b. NSTEMI 08/2015 with total occlusion of the mid LAD along with 20% mid-distal Cx, 100% dCX, 40% D1, 50% pCx, 50% m-dRCA, 80% dRCA.Given >12 hours out from symptom onset, medical therapy recommended.  . Diabetes mellitus type 2, controlled ([facility])    HbA1c 02/2011 was 6.6%  . Elevated PSA 09/2011   with prostate nodule.  Prostate bx ALL BENIGN 10/10/11 (Dr. Christella Hartigan followed by Urol--improved to 1.18 Jun 2017.  Nodule NOT palpable anymore on DRE at [facility] 01/2018 office f/u. Stable as of 08/2019.  Marland Kitchen GERD (gastroesophageal reflux disease)    occasional  . Headache(784.0)    cluster headaches a long time ago  . History of bronchitis   . History of vertigo   . HTN (hypertension)   . Hyperlipidemia   . Ischemic cardiomyopathy    a. 2D Echo 09/03/15: mild LVH, EF 40-45%, diffuse HK, akinesis of apical myocardium, grade 1 DD, aortic root  dimention 79m, ascending aorta mildly dilated, mildly dilated RV/RA.   .Marland KitchenMyocardial infarction ([facility] 2005; 08/2015   followed by Dr. CStanford Breed . PAD ([place]ipheral artery disease) [place] 06[place]   R LL abnl ABI and doppler->see PSH section of EMR for details.  . Thrombocytopenia ([facility]    Stable 2012-2017  . Tobacco abuse   . Wolf-Parkinson-White syndrome     Past Surgical History:  Procedure Laterality Date  . BLADDER TUMOR EXCISION  11/08/10; 05/2015   urothelial carcinoma (Dr. BAlinda Money.  2016 high grade urothelial dysplasia (carcinoma in situ), no invasive  malignancy.  .Marland KitchenCARDIAC CATHETERIZATION  2005   with stent placement  . CARDIAC CATHETERIZATION N/A 09/01/2015   Procedure: Left Heart Cath and Coronary Angiography;  Surgeon: HBelva Crome MD;  [facility];  Service: Cardiovascular;  Laterality: N/A;  . COLONOSCOPY  2010   Dr. MIsa Rankin repeat 2020.  Patient wanted to go to different GI when time for repeat-->[facility] unable to contact pt 2021  . CYSTOSCOPY W/ RETROGRADES Bilateral 06/22/2015   Procedure: CYSTOSCOPY WITH RETROGRADE PYELOGRAM;  Surgeon: LRaynelle Bring MD;  Location: [facility];  Service: Urology;  Laterality: Bilateral;  . CYSTOSCOPY W/ RETROGRADES N/A 11/17/2016   Procedure: CYSTOSCOPY WITH RETROGRADE PYELOGRAM;  Surgeon: LRaynelle Bring MD;  Location: [facility];  Service: Urology;  Laterality: N/A;  . CYSTOSCOPY WITH RETROGRADE PYELOGRAM, URETEROSCOPY AND STENT PLACEMENT N/A 06/06/2013   Procedure: CYSTOSCOPY WITH RETROGRADE PYELOGRAM;  Surgeon: LDutch Gray MD;  Location: [facility];  Service: Urology;  Laterality: N/A;  . LE ABI vascular studies  06/2019   R LL claudication + evidence of PAD (ABI very low AND popliteal and peroneal artery occlusion on dopler u/s) in R leg (L leg fine)->referred to Dr. AFletcher Anon  Conservative mgmt with walking program and low dose xarelto first, then consider revascularization.  .Marland Kitchenlexiscan  2012   Normal/negative  . [place]no trouble since last surgery  . myocardial perfusion study  2009   Neg ischemia, EF 52%  . TRANSURETHRAL RESECTION OF BLADDER TUMOR N/A 06/06/2013   Procedure: TRANSURETHRAL RESECTION OF BLADDER TUMOR (TURBT);  Surgeon: LDutch Gray MD;  [facility];  Service: Urology;  Laterality: N/A;.  PATH: squamous metaplasia, fibrosis with chronic inflammation, no malignancy.  . TRANSURETHRAL RESECTION OF BLADDER TUMOR N/A 06/22/2015   Carcinoma in situ: no invasive malignancy.  Procedure: TRANSURETHRAL RESECTION  OF BLADDER TUMOR (TURBT);  Surgeon: LRaynelle Bring MD;  [facility];  Service: Urology;  Laterality: N/A;  **BLADDER BIOPSY*RESECTION**  . TRANSURETHRAL RESECTION OF BLADDER TUMOR N/A 11/17/2016   Path: BENIGN UROTHELIUM WITH INFLAMMATION--no malignancy. Procedure: TRANSURETHRAL RESECTION OF BLADDER TUMOR (TURBT);  Surgeon: LRaynelle Bring MD;  [facility];  Service: Urology;  Laterality: N/A;  . VASECTOMY      Current Outpatient Medications  Medication Sig Dispense Refill  . ACCU-CHEK GUIDE test strip USE TO TEST BLOOD SUGAR TWICE DAILY 200 strip 0  . Accu-Chek Softclix Lancets lancets Use to check sugar twice daily. 200 each 0  . acetaminophen (TYLENOL) 500 MG tablet Take 1,000 mg by mouth daily as needed for mild pain or headache.    .Marland Kitchenaspirin EC 81 MG tablet Take 81 mg by mouth daily.    .Marland Kitchenatenolol (TENORMIN) 50 MG tablet TAKE ONE TABLET BY MOUTH EVERY DAY WITH LUNCH 90 tablet 0  . atorvastatin (LIPITOR) 80 MG tablet TAKE ONE TABLET BY MOUTH EVERY  EVENING 90 tablet 2  . blood glucose meter kit and supplies KIT Dispense based on patient and insurance preference. Use two times daily as directed. 1 each 0  . calcium carbonate (TUMS - DOSED IN MG ELEMENTAL CALCIUM) 500 MG chewable tablet Chew 1 tablet by mouth daily as needed for indigestion or heartburn.    . fluticasone (FLONASE) 50 MCG/ACT nasal spray Place 1 spray into both nostrils daily as needed for allergies. 16 g 3  . glipiZIDE (GLUCOTROL XL) 10 MG 24 hr tablet TAKE 1 TABLET (10 MG TOTAL) BY MOUTH DAILY WITH BREAKFAST. 90 tablet 1  . lisinopril (ZESTRIL) 10 MG tablet TAKE 1 TABLET BY MOUTH EVERY DAY 90 tablet 0  . metFORMIN (GLUCOPHAGE) 1000 MG tablet TAKE 1 TABLET BY MOUTH TWICE A DAY WITH MEALS 180 tablet 0  . nitroGLYCERIN (NITROSTAT) 0.4 MG SL tablet PLACE 1 TABLET UNDER THE TONGUE EVERY 5 (FIVE) MINUTES AS NEEDED FOR CHEST PAIN (UP TO 3 DOSES). 25 tablet 3  . rivaroxaban (XARELTO) 2.5 MG TABS tablet Take 1 tablet (2.5 mg  total) by mouth 2 (two) times daily. 60 tablet 6  . Semaglutide,0.25 or 0.5MG/DOS, (OZEMPIC, 0.25 OR 0.5 MG/DOSE,) 2 MG/1.5ML SOPN Inject 0.5 mg into the skin once a week. 4.5 mL 6  . tamsulosin (FLOMAX) 0.4 MG CAPS capsule TAKE 1 CAPSULE BY MOUTH EVERY DAY AFTER SUPPER. 90 capsule 1   No current facility-administered medications for this visit.    Allergies as of 07/29/2020  . (No Known Allergies)    Family History  Problem Relation Age of Onset  . Hypertension Mother   . Cancer Father        colon and prostate, mets to lungs.  . Hypertension Father   . Heart disease Brother        d. 38 MI  . Multiple sclerosis Brother   . Diabetes Brother   . Heart disease Paternal Uncle     Social History   Socioeconomic History  . Marital status: Married    Spouse name: Not on file  . Number of children: Not on file  . Years of education: Not on file  . Highest education level: Not on file  Occupational History  . Not on file  Tobacco Use  . Smoking status: Former Smoker    Packs/day: 0.25    Years: 40.00    Pack years: 10.00    Types: Cigarettes    Quit date: 09/05/2015    Years since quitting: 4.9  . Smokeless tobacco: Never Used  Vaping Use  . Vaping Use: Never used  Substance and Sexual Activity  . Alcohol use: No    Alcohol/week: 0.0 standard drinks  . Drug use: No  . Sexual activity: Not on file  Other Topics Concern  . Not on file  Social History Narrative   Married, 2 children, 4 grandchildren--[place].   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   Tob: 30 pack-yr hx, quit 08/2011.   No alcohol or drug use.   Active on farm but no formal exercise regimen.   Social Determinants of Health   Financial Resource Strain: Not on file  Food Insecurity: Not on file  Transportation Needs: Not on file  Physical Activity: Not on file  Stress: Not on file   Social Connections: Not on file  Intimate Partner Violence: Not on file  Physical Exam: BP 128/64   Pulse 70   Ht 5' 6" (1.676 m)   Wt 166 lb (75.3 kg)   BMI 26.79 kg/m  Constitutional: generally well-appearing Psychiatric: alert and oriented x3 Eyes: extraocular movements intact Mouth: oral pharynx moist, no lesions Neck: supple no lymphadenopathy Cardiovascular: heart regular rate and rhythm Lungs: clear to auscultation bilaterally Abdomen: soft, nontender, nondistended, no obvious ascites, no peritoneal signs, normal bowel sounds Extremities: no lower extremity edema bilaterally Skin: no lesions on visible extremities   Assessment and plan: 65 y.o. male with routine risk for colon cancer, elevated risk for procedure complications due to his ongoing blood thinner use  We discussed colon cancer screening.  He has not had colon cancer screening in at least 10 years and I recommended we do that for him with a colonoscopy at his soonest convenience.  He understands of being on a blood thinner increases his risk of procedure related complications.  I recommended that he hold his Xarelto for 2 days prior to the procedure and we will reach out to the prescribing physician to make sure they are okay with that recommendation.    Please see the "Patient Instructions" section for addition details about the plan.   Owens Loffler, MD [facility] 07/29/2020, 11:07 AM  Cc: McGowen, Adrian Blackwater, MD  Total time on date of encounter was 40  minutes (this included time spent preparing to see the patient reviewing records; obtaining and/or reviewing separately obtained history; performing a medically appropriate exam and/or evaluation; counseling and educating the patient and family if present; ordering medications, tests or procedures if applicable; and documenting clinical information in the health record).

## 2020-07-29 NOTE — Telephone Encounter (Signed)
Yes that is fine.  Hold Xarelto 2 days before.

## 2020-07-29 NOTE — Telephone Encounter (Signed)
[facility] Pre-operative Risk Assessment     Request for surgical clearance:     Endoscopy Procedure  What type of surgery is being performed?     Colonoscopy  When is this surgery scheduled?     09-23-2020  What type of clearance is required ?   Pharmacy  Are there any medications that need to be held prior to surgery and how long? Xarelto x 2 days  Practice name and name of physician performing surgery?      [facility]  What is your office phone and fax number?      Phone- [phone]  [phone]  Anesthesia type (None, local, MAC, general) ?       MAC

## 2020-07-29 NOTE — Telephone Encounter (Signed)
   Primary Cardiologist: Olga Millers, MD  Chart reviewed and patient contacted [place] by phone today as part of pre-operative protocol coverage. Given past medical history and time since last visit, based on ACC/AHA guidelines, Mark Blankenship would be at acceptable risk for the planned procedure without further cardiovascular testing.   OK to hold Xarelto two days pre op if needed.  The patient is aware of this recommendation.   The patient was advised that if he develops new symptoms prior to surgery to contact our office to arrange for a follow-up visit, and he verbalized understanding.  I will route this recommendation to the requesting party via Epic fax function and remove from pre-op pool.  Please call with questions.  Corine Shelter, PA-C 07/29/2020, 1:14 PM

## 2020-07-29 NOTE — Patient Instructions (Addendum)
If you are age 66 or older, your body mass index should be between 23-30. Your Body mass index is 26.79 kg/m. If this is out of the aforementioned range listed, please consider follow up with your Primary Care Provider.  If you are age 14 or younger, your body mass index should be between 19-25. Your Body mass index is 26.79 kg/m. If this is out of the aformentioned range listed, please consider follow up with your Primary Care Provider.   You have been scheduled for a colonoscopy. Please follow written instructions given to you at your visit today.  Please pick up your prep supplies at the pharmacy within the next 1-3 days. If you use inhalers (even only as needed), please bring them with you on the day of your procedure.  Due to recent changes in healthcare laws, you may see the results of your imaging and laboratory studies on MyChart before your provider has had a chance to review them.  We understand that in some cases there may be results that are confusing or concerning to you. Not all laboratory results come back in the same time frame and the provider may be waiting for multiple results in order to interpret others.  Please give [place] 48 hours in order for your provider to thoroughly review all the results before contacting the office for clarification of your results.   You will be contacted by our office prior to your procedure for directions on holding your Xarelto.  If you do not hear from our office 1 week prior to your scheduled procedure, please call [phone] to discuss.   We will obtain surgical pathology report from Dr Loreta Ave from 10 years ago.  Thank you for entrusting me with your care and choosing [facility].  Dr Christella Hartigan

## 2020-07-30 NOTE — Telephone Encounter (Signed)
Patient advised that he can hold Xarelto 2 days prior to colonoscopy scheduled on 09-23-2020.  Patient advised to take last dose of Xarelto on 09-20-2020, and that he would be instructed when to restart Xarelto by Dr Christella Hartigan after colonoscopy.  Patient agreed to plan and verbalized understanding.  No further questions.

## 2020-08-11 ENCOUNTER — Telehealth: Payer: Self-pay | Admitting: Gastroenterology

## 2020-08-11 NOTE — Telephone Encounter (Signed)
Reviewed outside records:  colonoscopy with Dr. Juanita Craver in 2010 for routine colon cancer screening.  She documented that she found 4 polyps.  2 were ablated with snare and into removed via biopsy forceps.  She also noted small internal hemorrhoids and a "prominent cecal fold" which was biopsied.  Pathology results show that the cecum biopsies were fragments of a hyperplastic polyp.  The rectosigmoid polyps were hyperplastic polyps.  It looks like Dr. Collene Mares recommended repeat colonoscopy at 10-year interval.

## 2020-08-31 ENCOUNTER — Encounter: Payer: Self-pay | Admitting: Family Medicine

## 2020-08-31 MED ORDER — ATENOLOL 50 MG PO TABS: ORAL_TABLET | ORAL | 0 refills | Status: DC

## 2020-09-01 ENCOUNTER — Other Ambulatory Visit: Payer: Self-pay | Admitting: Family Medicine

## 2020-09-02 ENCOUNTER — Other Ambulatory Visit: Payer: Self-pay | Admitting: *Deleted

## 2020-09-02 MED ORDER — GLIPIZIDE ER 10 MG PO TB24: 10.0000 mg | ORAL_TABLET | Freq: Every day | ORAL | 1 refills | Status: DC

## 2020-09-07 ENCOUNTER — Other Ambulatory Visit: Payer: Self-pay | Admitting: Family Medicine

## 2020-09-17 ENCOUNTER — Encounter: Payer: Self-pay | Admitting: Gastroenterology

## 2020-09-23 ENCOUNTER — Ambulatory Visit (AMBULATORY_SURGERY_CENTER): Payer: Medicare Other | Admitting: Gastroenterology

## 2020-09-23 ENCOUNTER — Encounter: Payer: Self-pay | Admitting: Family Medicine

## 2020-09-23 ENCOUNTER — Other Ambulatory Visit: Payer: Self-pay

## 2020-09-23 ENCOUNTER — Encounter: Payer: Self-pay | Admitting: Gastroenterology

## 2020-09-23 ENCOUNTER — Other Ambulatory Visit: Payer: Self-pay | Admitting: Family Medicine

## 2020-09-23 VITALS — BP 89/60 | HR 67 | Temp 96.2°F | Resp 15 | Ht 66.0 in | Wt 166.0 lb

## 2020-09-23 DIAGNOSIS — D125 Benign neoplasm of sigmoid colon: Secondary | ICD-10-CM | POA: Diagnosis not present

## 2020-09-23 DIAGNOSIS — D124 Benign neoplasm of descending colon: Secondary | ICD-10-CM

## 2020-09-23 DIAGNOSIS — Z86010 Personal history of colonic polyps: Secondary | ICD-10-CM

## 2020-09-23 DIAGNOSIS — Z1211 Encounter for screening for malignant neoplasm of colon: Secondary | ICD-10-CM

## 2020-09-23 DIAGNOSIS — D122 Benign neoplasm of ascending colon: Secondary | ICD-10-CM

## 2020-09-23 DIAGNOSIS — D120 Benign neoplasm of cecum: Secondary | ICD-10-CM | POA: Diagnosis not present

## 2020-09-23 DIAGNOSIS — I252 Old myocardial infarction: Secondary | ICD-10-CM | POA: Diagnosis not present

## 2020-09-23 DIAGNOSIS — D123 Benign neoplasm of transverse colon: Secondary | ICD-10-CM | POA: Diagnosis not present

## 2020-09-23 DIAGNOSIS — I2510 Atherosclerotic heart disease of native coronary artery without angina pectoris: Secondary | ICD-10-CM | POA: Diagnosis not present

## 2020-09-23 DIAGNOSIS — Z860101 Personal history of adenomatous and serrated colon polyps: Secondary | ICD-10-CM

## 2020-09-23 HISTORY — DX: Personal history of adenomatous and serrated colon polyps: Z86.0101

## 2020-09-23 HISTORY — DX: Personal history of colonic polyps: Z86.010

## 2020-09-23 MED ORDER — METFORMIN HCL 1000 MG PO TABS: 1000.0000 mg | ORAL_TABLET | Freq: Two times a day (BID) | ORAL | 0 refills | Status: DC

## 2020-09-23 MED ORDER — SODIUM CHLORIDE 0.9 % IV SOLN: 500.0000 mL | Freq: Once | INTRAVENOUS | Status: DC

## 2020-09-23 NOTE — Progress Notes (Signed)
Vitals signs by [place]

## 2020-09-23 NOTE — Progress Notes (Signed)
Called to room to assist during endoscopic procedure.  Patient ID and intended procedure confirmed with present staff. Received instructions for my participation in the procedure from the performing physician.  

## 2020-09-23 NOTE — Patient Instructions (Signed)
Information on polyps and hemorrhoids given to you today.  Await pathology results.  Resume previous diet and medications.  You may resume your Xarelto tomorrow at your prior dose.  YOU HAD AN ENDOSCOPIC PROCEDURE TODAY AT [facility]:   Refer to the procedure report that was given to you for any specific questions about what was found during the examination.  If the procedure report does not answer your questions, please call your gastroenterologist to clarify.  If you requested that your care partner not be given the details of your procedure findings, then the procedure report has been included in a sealed envelope for you to review at your convenience later.  YOU SHOULD EXPECT: Some feelings of bloating in the abdomen. Passage of more gas than usual.  Walking can help get rid of the air that was put into your GI tract during the procedure and reduce the bloating. If you had a lower endoscopy (such as a colonoscopy or flexible sigmoidoscopy) you may notice spotting of blood in your stool or on the toilet paper. If you underwent a bowel prep for your procedure, you may not have a normal bowel movement for a few days.  Please Note:  You might notice some irritation and congestion in your nose or some drainage.  This is from the oxygen used during your procedure.  There is no need for concern and it should clear up in a day or so.  SYMPTOMS TO REPORT IMMEDIATELY:   Following lower endoscopy (colonoscopy or flexible sigmoidoscopy):  Excessive amounts of blood in the stool  Significant tenderness or worsening of abdominal pains  Swelling of the abdomen that is new, acute  Fever of 100F or higher   For urgent or emergent issues, a gastroenterologist can be reached at any hour by calling [phone]. Do not use MyChart messaging for urgent concerns.    DIET:  We do recommend a small meal at first, but then you may proceed to your regular diet.  Drink plenty of fluids but  you should avoid alcoholic beverages for 24 hours.  ACTIVITY:  You should plan to take it easy for the rest of today and you should NOT DRIVE or use heavy machinery until tomorrow (because of the sedation medicines used during the test).    FOLLOW UP: Our staff will call the number listed on your records 48-72 hours following your procedure to check on you and address any questions or concerns that you may have regarding the information given to you following your procedure. If we do not reach you, we will leave a message.  We will attempt to reach you two times.  During this call, we will ask if you have developed any symptoms of COVID 19. If you develop any symptoms (ie: fever, flu-like symptoms, shortness of breath, cough etc.) before then, please call [phone].  If you test positive for Covid 19 in the 2 weeks post procedure, please call and report this information to [place].    If any biopsies were taken you will be contacted by phone or by letter within the next 1-3 weeks.  Please call us at [phone] if you have not heard about the biopsies in 3 weeks.    SIGNATURES/CONFIDENTIALITY: You and/or your care partner have signed paperwork which will be entered into your electronic medical record.  These signatures attest to the fact that that the information above on your After Visit Summary has been reviewed and is understood.  Full responsibility  of the confidentiality of this discharge information lies with you and/or your care-partner.

## 2020-09-23 NOTE — Progress Notes (Signed)
Report given to PACU, vss 

## 2020-09-23 NOTE — Op Note (Signed)
[facility] Patient Name: Mark Blankenship Procedure Date: 09/23/2020 1:18 PM MRN: [id number] Endoscopist: Milus Banister , MD Age: 65 Referring MD:  Date of Birth: 31-Jan-1956 Gender: Male Account #: [id number] Procedure:                Colonoscopy Indications:              Screening for colorectal malignant neoplasm;                            colonoscopy 2010 Dr. Collene Mares found 4 polyps, none were                            precancerous Medicines:                Monitored Anesthesia Care Procedure:                Pre-Anesthesia Assessment:                           - Prior to the procedure, a History and Physical                            was performed, and patient medications and                            allergies were reviewed. The patient's tolerance of                            previous anesthesia was also reviewed. The risks                            and benefits of the procedure and the sedation                            options and risks were discussed with the patient.                            All questions were answered, and informed consent                            was obtained. Prior Anticoagulants: The patient has                            taken Xarelto (rivaroxaban), last dose was 2 days                            prior to procedure. ASA Grade Assessment: III - A                            patient with severe systemic disease. After                            reviewing the risks and benefits, the patient was  deemed in satisfactory condition to undergo the                            procedure.                           After obtaining informed consent, the colonoscope                            was passed under direct vision. Throughout the                            procedure, the patient's blood pressure, pulse, and                            oxygen saturations were monitored continuously. The                             Olympus CF-HQ190L (Serial# [id number]) Colonoscope was                            introduced through the anus and advanced to the the                            cecum, identified by appendiceal orifice and                            ileocecal valve. The colonoscopy was performed                            without difficulty. The patient tolerated the                            procedure well. The quality of the bowel                            preparation was good. The ileocecal valve,                            appendiceal orifice, and rectum were photographed. Scope In: 1:21:03 PM Scope Out: 1:37:45 PM Scope Withdrawal Time: 0 hours 13 minutes 56 seconds  Total Procedure Duration: 0 hours 16 minutes 42 seconds  Findings:                 Nine sessile polyps were found in the sigmoid                            colon, descending colon, transverse colon,                            ascending colon and cecum. The polyps were 2 to 6                            mm in size. These polyps were removed with a cold  snare. Resection and retrieval were complete.                           Internal hemorrhoids were found. The hemorrhoids                            were small.                           The exam was otherwise without abnormality on                            direct and retroflexion views. Complications:            No immediate complications. Estimated blood loss:                            None. Estimated Blood Loss:     Estimated blood loss: none. Impression:               - Nine 2 to 6 mm polyps in the sigmoid colon, in                            the descending colon, in the transverse colon, in                            the ascending colon and in the cecum, removed with                            a cold snare. Resected and retrieved.                           - Internal hemorrhoids.                           - The examination was otherwise normal on direct                             and retroflexion views. Recommendation:           - Patient has a contact number available for                            emergencies. The signs and symptoms of potential                            delayed complications were discussed with the                            patient. Return to normal activities tomorrow.                            Written discharge instructions were provided to the                            patient.                           -  Resume previous diet.                           - Continue present medications. It is OK to resume                            your bloodthinner tomorrow.                           - Await pathology results. Milus Banister, MD 09/23/2020 1:40:50 PM This report has been signed electronically.

## 2020-09-25 ENCOUNTER — Telehealth: Payer: Self-pay | Admitting: *Deleted

## 2020-09-25 NOTE — Telephone Encounter (Signed)
  Follow up Call-  Call back number 09/23/2020  Post procedure Call Back phone  # [phone]  Permission to leave phone message Yes  Some recent data might be hidden     Patient questions:  Do you have a fever, pain , or abdominal swelling? No. Pain Score  0 *  Have you tolerated food without any problems? Yes.    Have you been able to return to your normal activities? Yes.    Do you have any questions about your discharge instructions: Diet   No. Medications  No. Follow up visit  No.  Do you have questions or concerns about your Care? No.  Actions: * If pain score is 4 or above: No action needed, pain <4.

## 2020-09-29 ENCOUNTER — Encounter: Payer: BC Managed Care – PPO | Admitting: Family Medicine

## 2020-10-01 ENCOUNTER — Other Ambulatory Visit: Payer: Self-pay

## 2020-10-02 ENCOUNTER — Encounter: Payer: Self-pay | Admitting: Family Medicine

## 2020-10-02 ENCOUNTER — Encounter: Payer: Self-pay | Admitting: Gastroenterology

## 2020-10-02 ENCOUNTER — Ambulatory Visit (INDEPENDENT_AMBULATORY_CARE_PROVIDER_SITE_OTHER): Payer: Medicare Other | Admitting: Family Medicine

## 2020-10-02 VITALS — BP 110/69 | HR 68 | Temp 97.5°F | Resp 16 | Ht 66.0 in | Wt 164.2 lb

## 2020-10-02 DIAGNOSIS — E1129 Type 2 diabetes mellitus with other diabetic kidney complication: Secondary | ICD-10-CM | POA: Diagnosis not present

## 2020-10-02 DIAGNOSIS — R809 Proteinuria, unspecified: Secondary | ICD-10-CM | POA: Diagnosis not present

## 2020-10-02 DIAGNOSIS — Z23 Encounter for immunization: Secondary | ICD-10-CM | POA: Diagnosis not present

## 2020-10-02 DIAGNOSIS — E7800 Pure hypercholesterolemia, unspecified: Secondary | ICD-10-CM | POA: Diagnosis not present

## 2020-10-02 DIAGNOSIS — Z0000 Encounter for general adult medical examination without abnormal findings: Secondary | ICD-10-CM | POA: Diagnosis not present

## 2020-10-02 DIAGNOSIS — Z7901 Long term (current) use of anticoagulants: Secondary | ICD-10-CM

## 2020-10-02 DIAGNOSIS — D693 Immune thrombocytopenic purpura: Secondary | ICD-10-CM

## 2020-10-02 DIAGNOSIS — I10 Essential (primary) hypertension: Secondary | ICD-10-CM | POA: Diagnosis not present

## 2020-10-02 DIAGNOSIS — Z125 Encounter for screening for malignant neoplasm of prostate: Secondary | ICD-10-CM

## 2020-10-02 LAB — CBC WITH DIFFERENTIAL/PLATELET
Basophils Absolute: 0 10*3/uL (ref 0.0–0.1)
Basophils Relative: 0.2 % (ref 0.0–3.0)
Eosinophils Absolute: 0.1 10*3/uL (ref 0.0–0.7)
Eosinophils Relative: 1.1 % (ref 0.0–5.0)
HCT: 45.7 % (ref 39.0–52.0)
Hemoglobin: 15.9 g/dL (ref 13.0–17.0)
Lymphocytes Relative: 16.3 % (ref 12.0–46.0)
Lymphs Abs: 1.2 10*3/uL (ref 0.7–4.0)
MCHC: 34.8 g/dL (ref 30.0–36.0)
MCV: 90.5 fl (ref 78.0–100.0)
Monocytes Absolute: 0.6 10*3/uL (ref 0.1–1.0)
Monocytes Relative: 8.3 % (ref 3.0–12.0)
Neutro Abs: 5.3 10*3/uL (ref 1.4–7.7)
Neutrophils Relative %: 74.1 % (ref 43.0–77.0)
Platelets: 131 10*3/uL — ABNORMAL LOW (ref 150.0–400.0)
RBC: 5.05 Mil/uL (ref 4.22–5.81)
RDW: 14.1 % (ref 11.5–15.5)
WBC: 7.2 10*3/uL (ref 4.0–10.5)

## 2020-10-02 LAB — COMPREHENSIVE METABOLIC PANEL
ALT: 13 U/L (ref 0–53)
AST: 14 U/L (ref 0–37)
Albumin: 4.3 g/dL (ref 3.5–5.2)
Alkaline Phosphatase: 68 U/L (ref 39–117)
BUN: 14 mg/dL (ref 6–23)
CO2: 28 mEq/L (ref 19–32)
Calcium: 9.5 mg/dL (ref 8.4–10.5)
Chloride: 104 mEq/L (ref 96–112)
Creatinine, Ser: 1.07 mg/dL (ref 0.40–1.50)
GFR: 72.92 mL/min (ref >60.00)
Glucose, Bld: 108 mg/dL — ABNORMAL HIGH (ref 70–99)
Potassium: 4.6 mEq/L (ref 3.5–5.1)
Sodium: 139 mEq/L (ref 135–145)
Total Bilirubin: 0.7 mg/dL (ref 0.2–1.2)
Total Protein: 6.6 g/dL (ref 6.0–8.3)

## 2020-10-02 LAB — LIPID PANEL
Cholesterol: 82 mg/dL (ref 0–200)
HDL: 29.2 mg/dL — ABNORMAL LOW (ref >39.00)
LDL Cholesterol: 38 mg/dL (ref 0–99)
NonHDL: 52.8
Total CHOL/HDL Ratio: 3
Triglycerides: 76 mg/dL (ref 0.0–149.0)
VLDL: 15.2 mg/dL (ref 0.0–40.0)

## 2020-10-02 LAB — HEMOGLOBIN A1C: Hgb A1c MFr Bld: 6.1 % (ref 4.6–6.5)

## 2020-10-02 NOTE — Progress Notes (Signed)
Office Note 10/02/2020  CC:  Chief Complaint  Patient presents with  . Annual Exam    Pt is fasting    HPI:  Mark Blankenship is a 65 y.o. White male who is here for annual health maintenance exam and for 3 mo f/u DM 2, HTN, HLD. A/P as of last visit: "1) DM 2, doing well last 3 mo-->great diet changes, tolerating latest med addition (ozempic). Continue ozempic 0.19m q week SQ, metformin 1000 mg bid, and glipizide xl 172mqd. Hba1c and bmet today. Cont checking gluc bid.  2) HTN: stable. Cont atenolol 5052md and lisinopril 55m29m. BMET today.  3) HLD: tolerating atorva 80mg81m LDL 47 and hepatic panel normal 3 months ago. Plan repeat 3 mo.  4) Chronic anticoag (low dose xarelto) for PAD, per vasc MD. No overt signs of bleeding. CBC monitoring today."  INTERIM HX: Doing fine, no complaints. 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  DM: says ozempic won't be covered by his insurer, he has 5 more wks of med left. Home gluc monitoring has been very good: 125-150 postprandial.  No fasting gluc checks in over a month. No burning, tingling, or numbness in feet.  No home bp monitoring. Compliant with atorva 80 qd. He tries to restrict carbs and fats.  No probs taking xarelto. Denies calf pain or swelling.  Past Medical History:  Diagnosis Date  . Arthritis   . Ascending aorta dilatation (HCC)    a. Ascending aorta mildly dilated by echo 09/03/15.  . Bladder cancer (HCC)[facility]   Mass removed, then BCG by urologist (Dr. BordeAlinda Moneyecurrence documented on cysto + bladder washings 10/14/15--getting more maintenance BCG.  Cysto 01/20/16 OK, then more BCG given.  Cysto 04/13/16 c/w just having finished BCG but cytology/FISH ok.  Cysto 07/27/2016 c/w small area of BCG related inflamm vs recurrent carcinoma in situ--washings for cytology done, BCG stopped due to severe sx's.  . [facility]  11/02/16 cysto showed 1.5 cm raised erythematous lesion->bx benign. Subsequent cystoscopies unchanged, most recent 07/2019.  . BPHMarland Kitchen(benign prostatic hyperplasia)    with hx of acute prostatitis  . CAD (coronary artery disease)    a. '05 MI > DES to circumflex. b. NSTEMI 08/2015 with total occlusion of the mid LAD along with 20% mid-distal Cx, 100% dCX, 40% D1, 50% pCx, 50% m-dRCA, 80% dRCA.Given >12 hours out from symptom onset, medical therapy recommended.  . Diabetes mellitus type 2, controlled (HCC) [place] [facility] 6.6[facility]  . DVT (deep venous thrombosis) (HCC) [place] Elevated PSA 09/2011   with prostate nodule.  Prostate bx ALL BENIGN 10/10/11 (Dr. BordeChristella Hartiganowed by Urol--improved to 1.18 Jun 2017.  Nodule NOT palpable anymore on DRE at urol [facility] office f/u. Stable as of 08/2019.  . GERMarland Kitchen (gastroesophageal reflux disease)    occasional  . Headache(784.0)    cluster headaches a long time ago  . History of adenomatous polyp of colon 09/23/2020   recall 09/2025  . History of bronchitis   . History of vertigo   . HTN (hypertension)   . Hyperlipidemia   . Ischemic cardiomyopathy    a. 2D Echo 09/03/15: mild LVH, EF 40-45%, diffuse HK, akinesis of apical myocardium, grade 1 DD, aortic root dimention 38mm,29mending aorta mildly dilated, mildly dilated RV/RA.   . MyocMarland Kitchenrdial infarction (HCC) [facility] 08/2015   followed  by Dr. Stanford Breed  . PAD (peripheral artery disease) ([place]) 06/2019   R LL abnl ABI and doppler->see PSH section of EMR for details.  . Thrombocytopenia ([place])    Stable 2012-2017  . Tobacco abuse   . Wolf-Parkinson-White syndrome     Past Surgical History:  Procedure Laterality Date  . BLADDER TUMOR EXCISION  11/08/10; 05/2015   urothelial carcinoma (Dr. Alinda Money).  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  Marland Kitchen CARDIAC CATHETERIZATION  2005   with stent placement  . CARDIAC CATHETERIZATION N/A 09/01/2015   Procedure: Left Heart Cath and  Coronary Angiography;  Surgeon: Belva Crome, MD;  Location: [facility];  Service: Cardiovascular;  Laterality: N/A;  . COLONOSCOPY  2010; 09/23/20   2010 Dr. Isa Rankin. 09/2020 Dr. Rose Fillers 09/2025.  . CYSTOSCOPY W/ RETROGRADES Bilateral 06/22/2015   Procedure: CYSTOSCOPY WITH RETROGRADE PYELOGRAM;  Surgeon: Raynelle Bring, MD;  Location: [facility];  Service: Urology;  Laterality: Bilateral;  . CYSTOSCOPY W/ RETROGRADES N/A 11/17/2016   Procedure: CYSTOSCOPY WITH RETROGRADE PYELOGRAM;  Surgeon: Raynelle Bring, MD;  Location: [facility];  Service: Urology;  Laterality: N/A;  . CYSTOSCOPY WITH RETROGRADE PYELOGRAM, URETEROSCOPY AND STENT PLACEMENT N/A 06/06/2013   Procedure: CYSTOSCOPY WITH RETROGRADE PYELOGRAM;  Surgeon: Dutch Gray, MD;  Location: [facility];  Service: Urology;  Laterality: N/A;  . LE ABI vascular studies  06/2019   R LL claudication + evidence of PAD (ABI very low AND popliteal and peroneal artery occlusion on dopler u/s) in R leg (L leg fine)->referred to Dr. Fletcher Anon.  Conservative mgmt with walking program and low dose xarelto first, then consider revascularization.  Marland Kitchen lexiscan  2012   Normal/negative  . [place] in [place] ([place])-no trouble since last surgery  . myocardial perfusion study  2009   Neg ischemia, EF 52%  . TRANSURETHRAL RESECTION OF BLADDER TUMOR N/A 06/06/2013   Procedure: TRANSURETHRAL RESECTION OF BLADDER TUMOR (TURBT);  Surgeon: Dutch Gray, MD;  Location: [facility];  Service: Urology;  Laterality: N/A;.  PATH: squamous metaplasia, fibrosis with chronic inflammation, no malignancy.  . TRANSURETHRAL RESECTION OF BLADDER TUMOR N/A 06/22/2015   Carcinoma in situ: no invasive malignancy.  Procedure: TRANSURETHRAL RESECTION OF BLADDER TUMOR (TURBT);  Surgeon: Raynelle Bring, MD;  Location: [facility];  Service: Urology;  Laterality: N/A;  **BLADDER BIOPSY*RESECTION**  . TRANSURETHRAL RESECTION OF BLADDER TUMOR N/A 11/17/2016    Path: BENIGN UROTHELIUM WITH INFLAMMATION--no malignancy. Procedure: TRANSURETHRAL RESECTION OF BLADDER TUMOR (TURBT);  Surgeon: Raynelle Bring, MD;  Location: [facility];  Service: Urology;  Laterality: N/A;  . VASECTOMY      Family History  Problem Relation Age of Onset  . Hypertension Mother   . Cancer Father        colon and prostate, mets to lungs.  . Hypertension Father   . Heart disease Brother        d. 40 MI  . Multiple sclerosis Brother   . Diabetes Brother   . Heart disease Paternal Uncle   . Colon cancer Neg Hx   . Colon polyps Neg Hx   . Esophageal cancer Neg Hx   . Stomach cancer Neg Hx     Social History   Socioeconomic History  . Marital status: Married    Spouse name: Not on file  . Number of children: Not on file  . Years of education: Not on file  . Highest education level: Not on file  Occupational History  . Not on file  Tobacco Use  . Smoking status: Former Smoker    Packs/day: 0.25    Years: 40.00    Pack years: 10.00    Types: Cigarettes    Quit date: 09/05/2015    Years since quitting: 5.0  . Smokeless tobacco: Never Used  Vaping Use  . Vaping Use: Never used  Substance and Sexual Activity  . Alcohol use: No    Alcohol/week: 0.0 standard drinks  . Drug use: No  . Sexual activity: Not on file  Other Topics Concern  . Not on file  Social History Narrative   Married, 2 children, 4 grandchildren--[place].   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   Tob: 30 pack-yr hx, quit 08/2011.   No alcohol or drug use.   Active on farm but no formal exercise regimen.   Social Determinants of Health   Financial Resource Strain: Not on file  Food Insecurity: Not on file  Transportation Needs: Not on file  Physical Activity: Not on file  Stress: Not on file  Social Connections: Not on file  Intimate Partner Violence: Not on file     Outpatient Medications Prior to Visit  Medication Sig Dispense Refill  . ACCU-CHEK GUIDE test strip USE TO TEST BLOOD SUGAR TWICE DAILY 200 strip 0  . Accu-Chek Softclix Lancets lancets Use to check sugar twice daily. 200 each 0  . aspirin EC 81 MG tablet Take 81 mg by mouth daily.    Marland Kitchen atenolol (TENORMIN) 50 MG tablet TAKE ONE TABLET BY MOUTH EVERY DAY WITH LUNCH 90 tablet 0  . atorvastatin (LIPITOR) 80 MG tablet TAKE ONE TABLET BY MOUTH EVERY EVENING 90 tablet 2  . blood glucose meter kit and supplies KIT Dispense based on patient and insurance preference. Use two times daily as directed. 1 each 0  . calcium carbonate (TUMS - DOSED IN MG ELEMENTAL CALCIUM) 500 MG chewable tablet Chew 1 tablet by mouth daily as needed for indigestion or heartburn.    . fluticasone (FLONASE) 50 MCG/ACT nasal spray Place 1 spray into both nostrils daily as needed for allergies. 16 g 3  . glipiZIDE (GLUCOTROL XL) 10 MG 24 hr tablet Take 1 tablet (10 mg total) by mouth daily with breakfast. 90 tablet 1  . lisinopril (ZESTRIL) 10 MG tablet TAKE 1 TABLET BY MOUTH EVERY DAY 90 tablet 0  . metFORMIN (GLUCOPHAGE) 1000 MG tablet Take 1 tablet (1,000 mg total) by mouth 2 (two) times daily with a meal. 60 tablet 0  . rivaroxaban (XARELTO) 2.5 MG TABS tablet Take 1 tablet (2.5 mg total) by mouth 2 (two) times daily. 60 tablet 6  . Semaglutide,0.25 or 0.5MG/DOS, (OZEMPIC, 0.25 OR 0.5 MG/DOSE,) 2 MG/1.5ML SOPN Inject 0.5 mg into the skin once a week. 4.5 mL 6  . tamsulosin (FLOMAX) 0.4 MG CAPS capsule TAKE 1 CAPSULE BY MOUTH EVERY DAY AFTER SUPPER. 90 capsule 1  . acetaminophen (TYLENOL) 500 MG tablet Take 1,000 mg by mouth daily as needed for mild pain or headache. (Patient not taking: Reported on 10/02/2020)    . nitroGLYCERIN (NITROSTAT) 0.4 MG SL tablet PLACE 1 TABLET UNDER THE TONGUE EVERY 5 (FIVE) MINUTES AS NEEDED FOR CHEST PAIN (UP TO 3 DOSES). (Patient not taking: Reported on 10/02/2020) 25 tablet 3   No  facility-administered medications prior to visit.  No Known Allergies  ROS Review of Systems  Constitutional: Negative for appetite change, chills, fatigue and fever.  HENT: Negative for congestion, dental problem, ear pain and sore throat.   Eyes: Negative for discharge, redness and visual disturbance.  Respiratory: Negative for cough, chest tightness, shortness of breath and wheezing.   Cardiovascular: Negative for chest pain, palpitations and leg swelling.  Gastrointestinal: Negative for abdominal pain, blood in stool, diarrhea, nausea and vomiting.  Genitourinary: Negative for difficulty urinating, dysuria, flank pain, frequency, hematuria and urgency.  Musculoskeletal: Negative for arthralgias, back pain, joint swelling, myalgias and neck stiffness.  Skin: Negative for pallor and rash.  Neurological: Negative for dizziness, speech difficulty, weakness and headaches.  Hematological: Negative for adenopathy. Does not bruise/bleed easily.  Psychiatric/Behavioral: Negative for confusion and sleep disturbance. The patient is not nervous/anxious.     PE; Vitals with BMI 10/02/2020 09/23/2020 09/23/2020  Height _0  - -  Weight 164 lbs 3 oz - -  BMI 01.77 - -  Systolic 939 89 87  Diastolic 69 60 61  Pulse 68 67 73    Gen: Alert, well appearing.  Patient is oriented to person, place, time, and situation. AFFECT: pleasant, lucid thought and speech. ENT: Ears: EACs clear, normal epithelium.  TMs with good light reflex and landmarks bilaterally.  Eyes: no injection, icteris, swelling, or exudate.  EOMI, PERRLA. Nose: no drainage or turbinate edema/swelling.  No injection or focal lesion.  Mouth: lips without lesion/swelling.  Oral mucosa pink and moist.  Dentition intact and without obvious caries or gingival swelling.  Oropharynx without erythema, exudate, or swelling.  Neck: supple/nontender.  No LAD, mass, or TM.  Carotid pulses 2+ bilaterally, without bruits. CV: RRR, no m/r/g.    LUNGS: CTA bilat, nonlabored resps, good aeration in all lung fields. ABD: soft, NT, ND, BS normal.  No hepatospenomegaly or mass.  No bruits. EXT: no clubbing, cyanosis, or edema.  Musculoskeletal: no joint swelling, erythema, warmth, or tenderness.  ROM of all joints intact. Skin - no sores or suspicious lesions or rashes or color changes Foot exam - no swelling, tenderness or skin or vascular lesions. Color and temperature is normal. Sensation is intact.  PT pulses non-palpable bilat, but DP pulses are palpable. Toenails are normal.   Pertinent labs:  Lab Results  Component Value Date   TSH 0.96 08/26/2015   Lab Results  Component Value Date   WBC 7.6 06/30/2020   HGB 15.5 06/30/2020   HCT 44.4 06/30/2020   MCV 90.2 06/30/2020   PLT 129.0 (L) 06/30/2020   Lab Results  Component Value Date   CREATININE 0.94 06/30/2020   BUN 14 06/30/2020   NA 138 06/30/2020   K 4.0 06/30/2020   CL 105 06/30/2020   CO2 27 06/30/2020   Lab Results  Component Value Date   ALT 21 03/25/2020   AST 19 03/25/2020   ALKPHOS 67 03/25/2020   BILITOT 1.0 03/25/2020   Lab Results  Component Value Date   CHOL 96 03/25/2020   Lab Results  Component Value Date   HDL 27.30 (L) 03/25/2020   Lab Results  Component Value Date   LDLCALC 47 03/25/2020   Lab Results  Component Value Date   TRIG 104.0 03/25/2020   Lab Results  Component Value Date   CHOLHDL 4 03/25/2020   Lab Results  Component Value Date   PSA 2.48 08/23/2019   PSA 2.42 03/05/2019   PSA 1.25 06/21/2017   Lab Results  Component Value  Date   HGBA1C 6.1 06/30/2020   ASSESSMENT AND PLAN:   1) DM 2, good control. 5 more wks of ozempic then he runs out and ins won't cover anymore. We'll see what a1c is today and decide if we want to try to replace this med in a month or not---we'll have to have him see what his insurer might cover in its place. A1c, lytes/cr, and urine microalb/cr today. Feet exam normal today.  2)  HLD: tolerating atorva 54m qd.  Goal LDL <70.  LDL was 47 six mo ago. FLP and hepatic panel today.  3) HTN: bp well controlled. Cont atenolol 567mqd, lisin 1095md. Lytes/cr today.  4) Chronic anticoag (low dose xarelto) for PAD, per vasc MD. No overt signs of bleeding. CBC monitoring today.  5) Health maintenance exam: Reviewed age and gender appropriate health maintenance issues (prudent diet, regular exercise, health risks of tobacco and excessive alcohol, use of seatbelts, fire alarms in home, use of sunscreen).  Also reviewed age and gender appropriate health screening as well as vaccine recommendations. Vaccines: Pneumovax 23 booster due->given today.  Prevnar recommended in 1 yr. Labs: fasting HP, Hba1c, urine microalb/cr. Prostate ca screening: followed by urol for elev PSA, next f/u 3 mo. Colon ca screening: UTD, recall 2027.  An After Visit Summary was printed and given to the patient.  FOLLOW UP:  Return in about 3 months (around 01/02/2021) for routine chronic illness f/u.  Signed:  PhiCrissie SicklesD           10/02/2020

## 2020-10-05 ENCOUNTER — Other Ambulatory Visit: Payer: Self-pay

## 2020-10-05 ENCOUNTER — Other Ambulatory Visit: Payer: Self-pay | Admitting: Family Medicine

## 2020-10-05 MED ORDER — TAMSULOSIN HCL 0.4 MG PO CAPS: ORAL_CAPSULE | ORAL | 1 refills | Status: DC

## 2020-10-05 MED ORDER — METFORMIN HCL 1000 MG PO TABS: 1000.0000 mg | ORAL_TABLET | Freq: Two times a day (BID) | ORAL | 1 refills | Status: DC

## 2020-10-27 ENCOUNTER — Other Ambulatory Visit: Payer: Self-pay | Admitting: Family Medicine

## 2020-11-18 ENCOUNTER — Encounter: Payer: Self-pay | Admitting: Family Medicine

## 2020-11-18 ENCOUNTER — Other Ambulatory Visit: Payer: Self-pay

## 2020-11-18 MED ORDER — LISINOPRIL 10 MG PO TABS: 1.0000 | ORAL_TABLET | Freq: Every day | ORAL | 1 refills | Status: DC

## 2020-12-09 ENCOUNTER — Encounter: Payer: Self-pay | Admitting: Family Medicine

## 2020-12-09 ENCOUNTER — Other Ambulatory Visit: Payer: Self-pay

## 2020-12-09 MED ORDER — ATENOLOL 50 MG PO TABS: ORAL_TABLET | ORAL | 0 refills | Status: DC

## 2021-01-04 ENCOUNTER — Other Ambulatory Visit: Payer: Self-pay

## 2021-01-04 ENCOUNTER — Ambulatory Visit (INDEPENDENT_AMBULATORY_CARE_PROVIDER_SITE_OTHER): Payer: Medicare Other | Admitting: Family Medicine

## 2021-01-04 ENCOUNTER — Encounter: Payer: Self-pay | Admitting: Family Medicine

## 2021-01-04 VITALS — BP 101/66 | HR 54 | Temp 97.4°F | Resp 16 | Ht 66.0 in | Wt 169.6 lb

## 2021-01-04 DIAGNOSIS — E7800 Pure hypercholesterolemia, unspecified: Secondary | ICD-10-CM

## 2021-01-04 DIAGNOSIS — I10 Essential (primary) hypertension: Secondary | ICD-10-CM

## 2021-01-04 DIAGNOSIS — E1159 Type 2 diabetes mellitus with other circulatory complications: Secondary | ICD-10-CM

## 2021-01-04 DIAGNOSIS — E118 Type 2 diabetes mellitus with unspecified complications: Secondary | ICD-10-CM

## 2021-01-04 LAB — BASIC METABOLIC PANEL
BUN: 14 mg/dL (ref 6–23)
CO2: 26 mEq/L (ref 19–32)
Calcium: 9.3 mg/dL (ref 8.4–10.5)
Chloride: 105 mEq/L (ref 96–112)
Creatinine, Ser: 1.06 mg/dL (ref 0.40–1.50)
GFR: 73.61 mL/min (ref >60.00)
Glucose, Bld: 141 mg/dL — ABNORMAL HIGH (ref 70–99)
Potassium: 4.4 mEq/L (ref 3.5–5.1)
Sodium: 139 mEq/L (ref 135–145)

## 2021-01-04 LAB — HEMOGLOBIN A1C: Hgb A1c MFr Bld: 7 % — ABNORMAL HIGH (ref 4.6–6.5)

## 2021-01-04 NOTE — Progress Notes (Signed)
OFFICE VISIT  01/04/2021  CC:  Chief Complaint  Patient presents with   Follow-up    RCI; 3 mo. Pt is fasting    HPI:    Patient is a 65 y.o. Caucasian male who presents for 3 mo f/u DM 2, HTN, HLD. A/P as of last visit: "1) DM 2, good control. 5 more wks of ozempic then he runs out and ins won't cover anymore. We'll see what a1c is today and decide if we want to try to replace this med in a month or not---we'll have to have him see what his insurer might cover in its place. A1c, lytes/cr, and urine microalb/cr today. Feet exam normal today.   2) HLD: tolerating atorva 66m qd.  Goal LDL <70.  LDL was 47 six mo ago. FLP and hepatic panel today.   3) HTN: bp well controlled. Cont atenolol 541mqd, lisin 1046md. Lytes/cr today.   4) Chronic anticoag (low dose xarelto) for PAD, per vasc MD. No overt signs of bleeding. CBC monitoring today.   5) Health maintenance exam: Reviewed age and gender appropriate health maintenance issues (prudent diet, regular exercise, health risks of tobacco and excessive alcohol, use of seatbelts, fire alarms in home, use of sunscreen).  Also reviewed age and gender appropriate health screening as well as vaccine recommendations. Vaccines: Pneumovax 23 booster due->given today.  Prevnar recommended in 1 yr. Labs: fasting HP, Hba1c, urine microalb/cr. Prostate ca screening: followed by urol for elev PSA, next f/u 3 mo. Colon ca screening: UTD, recall 2027."  INTERIM HX: Doing fine, no complaints.  DM: fasting gluc 108 yesterday, "I think running pretty normal", overall his diet is quite good. He ran out of ozempic about 2 wks after our last visit and has been taking only glipizide and metformin.  HTN:  no home monitoring. Compliant with mes.    HLD: tolerating atorva 47m21m.  ROS as above, plus--> no fevers, no CP, no SOB, no wheezing, no cough, no dizziness, no HAs, no rashes, no melena/hematochezia.  No polyuria or polydipsia.  No myalgias  or arthralgias.  No focal weakness, paresthesias, or tremors.  No acute vision or hearing abnormalities.  No dysuria or unusual/new urinary urgency or frequency.  No recent changes in lower legs. No n/v/d or abd pain.  No palpitations.       Past Medical History:  Diagnosis Date   Arthritis    Ascending aorta dilatation (HCC)[place] a. Ascending aorta mildly dilated by echo 09/03/15.   [facility]   Mass removed, then BCG by urologist (Dr. BordAlinda MoneyRecurrence documented on cysto + bladder washings 10/14/15--getting more maintenance BCG.  Cysto 01/20/16 OK, then more BCG given.  Cysto 04/13/16 c/w just having finished BCG but cytology/FISH ok.  Cysto 07/27/2016 c/w small area of BCG related inflamm vs recurrent carcinoma in situ--washings for cytology done, BCG stopped due to severe sx's.   Bladder cancer (HCC)[place] Surveillance cysto 11/02/16 cysto showed 1.5 cm raised erythematous lesion->bx benign. Subsequent cystoscopies unchanged, most recent 07/2019.   BPH (benign prostatic hyperplasia)    with hx of acute prostatitis   CAD (coronary artery disease)    a. '05 MI > DES to circumflex. b. NSTEMI 08/2015 with total occlusion of the mid LAD along with 20% mid-distal Cx, 100% dCX, 40% D1, 50% pCx, 50% m-dRCA, 80% dRCA.Given >12 hours out from symptom onset, medical therapy recommended.   Diabetes mellitus type 2, controlled (HCC)[place]  HbA1c 02/2011 was 6.6%   DVT (deep venous thrombosis) (HCC)    Elevated PSA 09/2011   with prostate nodule.  Prostate bx ALL BENIGN 10/10/11 (Dr. Christella Hartigan followed by Urol--improved to 1.18 Jun 2017.  Nodule NOT palpable anymore on DRE at [facility] 01/2018 office f/u. Stable as of 08/2019.   GERD (gastroesophageal reflux disease)    occasional   Headache(784.0)    cluster headaches a long time ago   History of adenomatous polyp of colon 09/23/2020   recall 09/2025   History of bronchitis    History of vertigo    HTN (hypertension)    Hyperlipidemia    Ischemic  cardiomyopathy    a. 2D Echo 09/03/15: mild LVH, EF 40-45%, diffuse HK, akinesis of apical myocardium, grade 1 DD, aortic root dimention 11m, ascending aorta mildly dilated, mildly dilated RV/RA.    Myocardial infarction ([facility] 2005; 08/2015   followed by Dr. CStanford Breed  PAD (peripheral artery disease) ([place] 06/2019   R LL abnl ABI and doppler->see PSH section of EMR for details.   Thrombocytopenia ([place]    Stable 2012-2017   Tobacco abuse    Wolf-Parkinson-White syndrome     Past Surgical History:  Procedure Laterality Date   BLADDER TUMOR EXCISION  11/08/10; 05/2015   urothelial carcinoma (Dr. BAlinda Money.  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   CARDIAC CATHETERIZATION  2005   with stent placement   CARDIAC CATHETERIZATION N/A 09/01/2015   Procedure: Left Heart Cath and Coronary Angiography;  Surgeon: HBelva Crome MD;  [facility];  Service: Cardiovascular;  Laterality: N/A;   COLONOSCOPY  2010; 09/23/20   2010 Dr. MIsa Rankin 09/2020 Dr. JRose Fillers3/2027.   CYSTOSCOPY W/ RETROGRADES Bilateral 06/22/2015   Procedure: CYSTOSCOPY WITH RETROGRADE PYELOGRAM;  Surgeon: LRaynelle Bring MD;  [facility];  Service: Urology;  Laterality: Bilateral;   CYSTOSCOPY W/ RETROGRADES N/A 11/17/2016   Procedure: CYSTOSCOPY WITH RETROGRADE PYELOGRAM;  Surgeon: LRaynelle Bring MD;  [facility];  Service: Urology;  Laterality: N/A;   CYSTOSCOPY WITH RETROGRADE PYELOGRAM, URETEROSCOPY AND STENT PLACEMENT N/A 06/06/2013   Procedure: CYSTOSCOPY WITH RETROGRADE PYELOGRAM;  Surgeon: LDutch Gray MD;  [facility];  Service: Urology;  Laterality: N/A;   LE ABI vascular studies  06/2019   R LL claudication + evidence of PAD (ABI very low AND popliteal and peroneal artery occlusion on dopler u/s) in R leg (L leg fine)->referred to Dr. AFletcher Anon  Conservative mgmt with walking program and low dose xarelto first, then consider  revascularization.   lexiscan  2012   Normal/negative   LUMBAR SPINE [place])-no trouble since last surgery   myocardial perfusion study  2009   Neg ischemia, EF 52%   TRANSURETHRAL RESECTION OF BLADDER TUMOR N/A 06/06/2013   Procedure: TRANSURETHRAL RESECTION OF BLADDER TUMOR (TURBT);  Surgeon: LDutch Gray MD;  [facility];  Service: Urology;  Laterality: N/A;.  PATH: squamous metaplasia, fibrosis with chronic inflammation, no malignancy.   TRANSURETHRAL RESECTION OF BLADDER TUMOR N/A 06/22/2015   Carcinoma in situ: no invasive malignancy.  Procedure: TRANSURETHRAL RESECTION OF BLADDER TUMOR (TURBT);  Surgeon: LRaynelle Bring MD;  [facility];  Service: Urology;  Laterality: N/A;  **BLADDER BIOPSY*RESECTION**   TRANSURETHRAL RESECTION OF BLADDER TUMOR N/A 11/17/2016   Path: BENIGN UROTHELIUM WITH INFLAMMATION--no malignancy. Procedure: TRANSURETHRAL RESECTION OF BLADDER TUMOR (TURBT);  Surgeon: LRaynelle Bring MD;  [facility];  Service: Urology;  Laterality: N/A;   VASECTOMY      Outpatient Medications Prior to Visit  Medication Sig Dispense Refill   ACCU-CHEK GUIDE test strip USE TO TEST BLOOD SUGAR TWICE DAILY 200 strip 0   Accu-Chek Softclix Lancets lancets Use to check sugar twice daily. 200 each 0   acetaminophen (TYLENOL) 500 MG tablet Take 1,000 mg by mouth daily as needed for mild pain or headache.     aspirin EC 81 MG tablet Take 81 mg by mouth daily.     atenolol (TENORMIN) 50 MG tablet TAKE ONE TABLET BY MOUTH EVERY DAY WITH LUNCH 90 tablet 0   atorvastatin (LIPITOR) 80 MG tablet TAKE ONE TABLET BY MOUTH EVERY EVENING 90 tablet 2   blood glucose meter kit and supplies KIT Dispense based on patient and insurance preference. Use two times daily as directed. 1 each 0   calcium carbonate (TUMS - DOSED IN MG ELEMENTAL CALCIUM) 500 MG chewable tablet Chew 1 tablet by mouth daily as needed for indigestion or heartburn.      glipiZIDE (GLUCOTROL XL) 10 MG 24 hr tablet Take 1 tablet (10 mg total) by mouth daily with breakfast. 90 tablet 1   lisinopril (ZESTRIL) 10 MG tablet Take 1 tablet (10 mg total) by mouth daily. 90 tablet 1   metFORMIN (GLUCOPHAGE) 1000 MG tablet Take 1 tablet (1,000 mg total) by mouth 2 (two) times daily with a meal. 180 tablet 1   rivaroxaban (XARELTO) 2.5 MG TABS tablet Take 1 tablet (2.5 mg total) by mouth 2 (two) times daily. 60 tablet 6   tamsulosin (FLOMAX) 0.4 MG CAPS capsule TAKE 1 CAPSULE BY MOUTH EVERY DAY AFTER SUPPER. 90 capsule 1   nitroGLYCERIN (NITROSTAT) 0.4 MG SL tablet PLACE 1 TABLET UNDER THE TONGUE EVERY 5 (FIVE) MINUTES AS NEEDED FOR CHEST PAIN (UP TO 3 DOSES). (Patient not taking: No sig reported) 25 tablet 3   Semaglutide,0.25 or 0.5MG/DOS, (OZEMPIC, 0.25 OR 0.5 MG/DOSE,) 2 MG/1.5ML SOPN Inject 0.5 mg into the skin once a week. (Patient not taking: Reported on 01/04/2021) 4.5 mL 6   fluticasone (FLONASE) 50 MCG/ACT nasal spray Place 1 spray into both nostrils daily as needed for allergies. (Patient not taking: Reported on 01/04/2021) 16 g 3   No facility-administered medications prior to visit.    No Known Allergies  ROS As per HPI  PE: Vitals with BMI 01/04/2021 10/02/2020 09/23/2020  Height _0  _1  -  Weight 169 lbs 10 oz 164 lbs 3 oz -  BMI 49.44 96.75 -  Systolic 916 384 89  Diastolic 66 69 60  Pulse 54 68 67    Gen: Alert, well appearing.  Patient is oriented to person, place, time, and situation. AFFECT: pleasant, lucid thought and speech. CV: RRR, no m/r/g.   LUNGS: CTA bilat, nonlabored resps, good aeration in all lung fields. EXT: no clubbing or cyanosis.  no edema.    LABS:  Lab Results  Component Value Date   TSH 0.96 08/26/2015   Lab Results  Component Value Date   WBC 7.2 10/02/2020   HGB 15.9 10/02/2020   HCT 45.7 10/02/2020   MCV 90.5 10/02/2020   PLT 131.0 (L) 10/02/2020   Lab Results  Component Value Date   CREATININE 1.07  10/02/2020   BUN 14 10/02/2020   NA 139 10/02/2020   K 4.6 10/02/2020   CL 104 10/02/2020   CO2 28 10/02/2020   Lab Results  Component Value Date  ALT 13 10/02/2020   AST 14 10/02/2020   ALKPHOS 68 10/02/2020   BILITOT 0.7 10/02/2020   Lab Results  Component Value Date   CHOL 82 10/02/2020   Lab Results  Component Value Date   HDL 29.20 (L) 10/02/2020   Lab Results  Component Value Date   LDLCALC 38 10/02/2020   Lab Results  Component Value Date   TRIG 76.0 10/02/2020   Lab Results  Component Value Date   CHOLHDL 3 10/02/2020   Lab Results  Component Value Date   PSA 2.48 08/23/2019   PSA 2.42 03/05/2019   PSA 1.25 06/21/2017   Lab Results  Component Value Date   HGBA1C 6.1 10/02/2020    IMPRESSION AND PLAN:  1) DM 2 with complications (CAD/PAD):  Hba1c and lytes/cr today.  2) HTN: good control on lisinopril 74m qd and atenolol 50 mg qd. Lytes/cr today.  3) Hyperchol: LDL was 38 about 3 mo ago. Cont atorva 80 mg qd. Plan lipid and hepatic panel recheck 3 mo.  4) Chronic anticoag (low dose xarelto) for PAD, per vasc MD. No overt signs of bleeding. Monitor cbc next f/u 3 mo.  An After Visit Summary was printed and given to the patient.  FOLLOW UP: Return in about 3 months (around 04/06/2021) for routine chronic illness f/u. Next cpe 09/2021  Signed:  PCrissie Sickles MD           01/04/2021

## 2021-01-08 DIAGNOSIS — R972 Elevated prostate specific antigen [PSA]: Secondary | ICD-10-CM | POA: Diagnosis not present

## 2021-01-08 LAB — PSA: PSA: 3.82

## 2021-01-15 DIAGNOSIS — R3914 Feeling of incomplete bladder emptying: Secondary | ICD-10-CM | POA: Diagnosis not present

## 2021-01-15 DIAGNOSIS — Z8551 Personal history of malignant neoplasm of bladder: Secondary | ICD-10-CM | POA: Diagnosis not present

## 2021-01-15 DIAGNOSIS — N401 Enlarged prostate with lower urinary tract symptoms: Secondary | ICD-10-CM | POA: Diagnosis not present

## 2021-01-15 DIAGNOSIS — R972 Elevated prostate specific antigen [PSA]: Secondary | ICD-10-CM | POA: Diagnosis not present

## 2021-01-20 ENCOUNTER — Other Ambulatory Visit: Payer: Self-pay

## 2021-01-20 MED ORDER — ATORVASTATIN CALCIUM 80 MG PO TABS: 80.0000 mg | ORAL_TABLET | Freq: Every evening | ORAL | 2 refills | Status: DC

## 2021-02-08 ENCOUNTER — Encounter: Payer: Self-pay | Admitting: Family Medicine

## 2021-02-17 ENCOUNTER — Encounter: Payer: Self-pay | Admitting: Family Medicine

## 2021-02-23 ENCOUNTER — Other Ambulatory Visit: Payer: Self-pay

## 2021-02-23 MED ORDER — METFORMIN HCL 1000 MG PO TABS: 1000.0000 mg | ORAL_TABLET | Freq: Two times a day (BID) | ORAL | 0 refills | Status: DC

## 2021-02-27 ENCOUNTER — Other Ambulatory Visit: Payer: Self-pay | Admitting: Family Medicine

## 2021-04-06 ENCOUNTER — Other Ambulatory Visit: Payer: Self-pay

## 2021-04-07 ENCOUNTER — Encounter: Payer: Self-pay | Admitting: Family Medicine

## 2021-04-07 ENCOUNTER — Ambulatory Visit (INDEPENDENT_AMBULATORY_CARE_PROVIDER_SITE_OTHER): Payer: Medicare Other | Admitting: Family Medicine

## 2021-04-07 ENCOUNTER — Other Ambulatory Visit: Payer: Self-pay

## 2021-04-07 VITALS — BP 123/68 | HR 60 | Temp 97.7°F | Resp 16 | Ht 66.0 in | Wt 176.8 lb

## 2021-04-07 DIAGNOSIS — R809 Proteinuria, unspecified: Secondary | ICD-10-CM | POA: Diagnosis not present

## 2021-04-07 DIAGNOSIS — Z23 Encounter for immunization: Secondary | ICD-10-CM

## 2021-04-07 DIAGNOSIS — I10 Essential (primary) hypertension: Secondary | ICD-10-CM

## 2021-04-07 DIAGNOSIS — E7800 Pure hypercholesterolemia, unspecified: Secondary | ICD-10-CM

## 2021-04-07 DIAGNOSIS — Z7901 Long term (current) use of anticoagulants: Secondary | ICD-10-CM | POA: Diagnosis not present

## 2021-04-07 DIAGNOSIS — E1129 Type 2 diabetes mellitus with other diabetic kidney complication: Secondary | ICD-10-CM | POA: Diagnosis not present

## 2021-04-07 LAB — COMPREHENSIVE METABOLIC PANEL
ALT: 22 U/L (ref 0–53)
AST: 20 U/L (ref 0–37)
Albumin: 4.3 g/dL (ref 3.5–5.2)
Alkaline Phosphatase: 66 U/L (ref 39–117)
BUN: 18 mg/dL (ref 6–23)
CO2: 24 mEq/L (ref 19–32)
Calcium: 9.2 mg/dL (ref 8.4–10.5)
Chloride: 104 mEq/L (ref 96–112)
Creatinine, Ser: 1.08 mg/dL (ref 0.40–1.50)
GFR: 71.85 mL/min (ref >60.00)
Glucose, Bld: 149 mg/dL — ABNORMAL HIGH (ref 70–99)
Potassium: 4.3 mEq/L (ref 3.5–5.1)
Sodium: 138 mEq/L (ref 135–145)
Total Bilirubin: 0.9 mg/dL (ref 0.2–1.2)
Total Protein: 6.7 g/dL (ref 6.0–8.3)

## 2021-04-07 LAB — CBC
HCT: 45.6 % (ref 39.0–52.0)
Hemoglobin: 15.7 g/dL (ref 13.0–17.0)
MCHC: 34.4 g/dL (ref 30.0–36.0)
MCV: 91.1 fl (ref 78.0–100.0)
Platelets: 123 10*3/uL — ABNORMAL LOW (ref 150.0–400.0)
RBC: 5.01 Mil/uL (ref 4.22–5.81)
RDW: 14.2 % (ref 11.5–15.5)
WBC: 7 10*3/uL (ref 4.0–10.5)

## 2021-04-07 LAB — HEMOGLOBIN A1C: Hgb A1c MFr Bld: 7.7 % — ABNORMAL HIGH (ref 4.6–6.5)

## 2021-04-07 LAB — LIPID PANEL
Cholesterol: 105 mg/dL (ref 0–200)
HDL: 33.7 mg/dL — ABNORMAL LOW (ref >39.00)
LDL Cholesterol: 52 mg/dL (ref 0–99)
NonHDL: 71.51
Total CHOL/HDL Ratio: 3
Triglycerides: 97 mg/dL (ref 0.0–149.0)
VLDL: 19.4 mg/dL (ref 0.0–40.0)

## 2021-04-07 MED ORDER — TETANUS-DIPHTH-ACELL PERTUSSIS 5-2-15.5 LF-MCG/0.5 IM SUSP: 0.5000 mL | Freq: Once | INTRAMUSCULAR | 0 refills | Status: AC

## 2021-04-07 MED ORDER — METFORMIN HCL 1000 MG PO TABS: 1000.0000 mg | ORAL_TABLET | Freq: Two times a day (BID) | ORAL | 3 refills | Status: DC

## 2021-04-07 MED ORDER — GLIPIZIDE ER 10 MG PO TB24: 10.0000 mg | ORAL_TABLET | Freq: Every day | ORAL | 3 refills | Status: DC

## 2021-04-07 MED ORDER — TAMSULOSIN HCL 0.4 MG PO CAPS: ORAL_CAPSULE | ORAL | 3 refills | Status: DC

## 2021-04-07 MED ORDER — LISINOPRIL 10 MG PO TABS: 10.0000 mg | ORAL_TABLET | Freq: Every day | ORAL | 3 refills | Status: DC

## 2021-04-07 NOTE — Addendum Note (Signed)
Addended by: Beryle Lathe S on: 04/07/2021 09:46 AM   Modules accepted: Orders

## 2021-04-07 NOTE — Progress Notes (Signed)
OFFICE VISIT  04/07/2021  CC:  Chief Complaint  Patient presents with   Follow-up    RCI, 3 mo. Pt is fasting. Rx sent to pharmacy to update tdap and flu shot given today.   HPI:    Patient is a 65 y.o. Caucasian male who presents for 3 mo f/u DM, HTN, HLD, and PAD (for which he is treated with low dose xarelto per vasc MD).  A/P as of last visit: "1) DM 2 with complications (CAD/PAD):  Hba1c and lytes/cr today.   2) HTN: good control on lisinopril '10mg'$  qd and atenolol 50 mg qd. Lytes/cr today.   3) Hyperchol: LDL was 38 about 3 mo ago. Cont atorva 80 mg qd. Plan lipid and hepatic panel recheck 3 mo.   4) Chronic anticoag (low dose xarelto) for PAD, per vasc MD. No overt signs of bleeding. Monitor cbc next f/u 3 mo."  INTERIM HX: Labs stable last visit except Hba1c rose from 6.1 to 7%. Chose to make no changes but if rising again today then will add med.  DM: metformin, glipzide.  Was on ozempic but ran out prior to last f/u visit and we did not restart it d/t cost. Fastings up to 130-140 range last 3 wks.  Prior to this they were near normal. Pt admits some eating too much + wrong foods lately.  Working hard.  ROS as above, plus--> no fevers, no CP, no SOB, no wheezing, no cough, no dizziness, no HAs, no rashes, no melena/hematochezia.  No polyuria or polydipsia.  No focal weakness, paresthesias, or tremors.  No acute vision or hearing abnormalities.  No dysuria or unusual/new urinary urgency or frequency.  No recent changes in lower legs. No n/v/d or abd pain.  No palpitations.     Past Medical History:  Diagnosis Date   Arthritis    Ascending aorta dilatation ([place])    a. Ascending aorta mildly dilated by echo 09/03/15.   Bladder cancer [facility]) 2012   Mass removed, then BCG by urologist (Dr. Alinda Money).  Recurrence documented on cysto + bladder washings 10/14/15--getting more maintenance BCG.  Cysto 01/20/16 OK, then more BCG given.  Cysto 04/13/16 c/w just having finished BCG  but cytology/FISH ok.  Cysto 07/27/2016 c/w small area of BCG related inflamm, BCG stopped due to severe sx's. 12/2020 cysto benign   Bladder cancer ([place])    Surveillance cysto 11/02/16 cysto showed 1.5 cm raised erythematous lesion->bx benign. Subsequent cystoscopies unchanged, most recent 07/2019.   BPH (benign prostatic hyperplasia)    with hx of acute prostatitis   CAD (coronary artery disease)    a. '05 MI > DES to circumflex. b. NSTEMI 08/2015 with total occlusion of the mid LAD along with 20% mid-distal Cx, 100% dCX, 40% D1, 50% pCx, 50% m-dRCA, 80% dRCA.Given >12 hours out from symptom onset, medical therapy recommended.   Diabetes mellitus type 2, controlled ([place])    HbA1c 02/2011 was 6.6%   DVT (deep venous thrombosis) (HCC)    Elevated PSA 09/2011   with prostate nodule.  Prostate bx ALL BENIGN 10/10/11 (Dr. Christella Hartigan followed by Urol--improved to 1.18 Jun 2017.  Nodule NOT palpable anymore on DRE at [facility] 01/2018 office f/u. Stable as of 08/2019. Slight incease 12/2020, urol to rpt 1 yr.   GERD (gastroesophageal reflux disease)    occasional   Headache(784.0)    cluster headaches a long time ago   History of adenomatous polyp of colon 09/23/2020   recall 09/2025   History of  bronchitis    History of vertigo    HTN (hypertension)    Hyperlipidemia    Ischemic cardiomyopathy    a. 2D Echo 09/03/15: mild LVH, EF 40-45%, diffuse HK, akinesis of apical myocardium, grade 1 DD, aortic root dimention 74m, ascending aorta mildly dilated, mildly dilated RV/RA.    Myocardial infarction ([facility] 2005; 08/2015   followed by Dr. CStanford Breed  PAD [place]ipheral artery disease) ([place] 06/[place]   R LL abnl ABI and doppler->see PSH section of EMR for details.   Thrombocytopenia ([place]    Stable 2012-2017   Tobacco abuse    Wolf-Parkinson-White syndrome     Past Surgical History:  Procedure Laterality Date   BLADDER TUMOR EXCISION  11/08/10; 05/2015   urothelial carcinoma (Dr. BAlinda Money.  2016 high grade  urothelial dysplasia (carcinoma in situ), no invasive malignancy.   CARDIAC CATHETERIZATION  2005   with stent placement   CARDIAC CATHETERIZATION N/A 09/01/2015   Procedure: Left Heart Cath and Coronary Angiography;  Surgeon: HBelva Crome MD;  [facility];  Service: Cardiovascular;  Laterality: N/A;   COLONOSCOPY  2010; 09/23/20   2010 Dr. MIsa Rankin 09/2020 Dr. JRose Fillers3/2027.   CYSTOSCOPY W/ RETROGRADES Bilateral 06/22/2015   Procedure: CYSTOSCOPY WITH RETROGRADE PYELOGRAM;  Surgeon: LRaynelle Bring MD;  [facility];  Service: Urology;  Laterality: Bilateral;   CYSTOSCOPY W/ RETROGRADES N/A 11/17/2016   Procedure: CYSTOSCOPY WITH RETROGRADE PYELOGRAM;  Surgeon: LRaynelle Bring MD;  [facility];  Service: Urology;  Laterality: N/A;   CYSTOSCOPY WITH RETROGRADE PYELOGRAM, URETEROSCOPY AND STENT PLACEMENT N/A 06/06/2013   Procedure: CYSTOSCOPY WITH RETROGRADE PYELOGRAM;  Surgeon: LDutch Gray MD;  [facility];  Service: Urology;  Laterality: N/A;   LE ABI vascular studies  06/2019   R LL claudication + evidence of PAD (ABI very low AND popliteal and peroneal artery occlusion on dopler u/s) in R leg (L leg fine)->referred to Dr. AFletcher Anon  Conservative mgmt with walking program and low dose xarelto first, then consider revascularization.   lexiscan  2012   Normal/negative   [place]no trouble since last surgery   myocardial perfusion study  2009   Neg ischemia, EF 52%   TRANSURETHRAL RESECTION OF BLADDER TUMOR N/A 06/06/2013   Procedure: TRANSURETHRAL RESECTION OF BLADDER TUMOR (TURBT);  Surgeon: LDutch Gray MD;  [facility];  Service: Urology;  Laterality: N/A;.  PATH: squamous metaplasia, fibrosis with chronic inflammation, no malignancy.   TRANSURETHRAL RESECTION OF BLADDER TUMOR N/A 06/22/2015   Carcinoma in situ: no invasive malignancy.  Procedure: TRANSURETHRAL RESECTION OF BLADDER  TUMOR (TURBT);  Surgeon: LRaynelle Bring MD;  [facility];  Service: Urology;  Laterality: N/A;  **BLADDER BIOPSY*RESECTION**   TRANSURETHRAL RESECTION OF BLADDER TUMOR N/A 11/17/2016   Path: BENIGN UROTHELIUM WITH INFLAMMATION--no malignancy. Procedure: TRANSURETHRAL RESECTION OF BLADDER TUMOR (TURBT);  Surgeon: LRaynelle Bring MD;  [facility];  Service: Urology;  Laterality: N/A;   VASECTOMY      Outpatient Medications Prior to Visit  Medication Sig Dispense Refill   acetaminophen (TYLENOL) 500 MG tablet Take 1,000 mg by mouth daily as needed for mild pain or headache.     aspirin EC 81 MG tablet Take 81 mg by mouth daily.     atenolol (TENORMIN) 50 MG tablet TAKE 1 TABLET BY MOUTH EVERY DAY WITH LUNCH 90 tablet 0   atorvastatin (LIPITOR) 80 MG tablet Take 1 tablet (80 mg total)  by mouth every evening. 90 tablet 2   calcium carbonate (TUMS - DOSED IN MG ELEMENTAL CALCIUM) 500 MG chewable tablet Chew 1 tablet by mouth daily as needed for indigestion or heartburn.     rivaroxaban (XARELTO) 2.5 MG TABS tablet Take 1 tablet (2.5 mg total) by mouth 2 (two) times daily. 60 tablet 6   nitroGLYCERIN (NITROSTAT) 0.4 MG SL tablet PLACE 1 TABLET UNDER THE TONGUE EVERY 5 (FIVE) MINUTES AS NEEDED FOR CHEST PAIN (UP TO 3 DOSES). (Patient not taking: No sig reported) 25 tablet 3   glipiZIDE (GLUCOTROL XL) 10 MG 24 hr tablet Take 1 tablet (10 mg total) by mouth daily with breakfast. 90 tablet 1   lisinopril (ZESTRIL) 10 MG tablet Take 1 tablet (10 mg total) by mouth daily. 90 tablet 1   metFORMIN (GLUCOPHAGE) 1000 MG tablet Take 1 tablet (1,000 mg total) by mouth 2 (two) times daily with a meal. 180 tablet 0   Semaglutide,0.25 or 0.'5MG'$ /DOS, (OZEMPIC, 0.25 OR 0.5 MG/DOSE,) 2 MG/1.5ML SOPN Inject 0.5 mg into the skin once a week. (Patient not taking: No sig reported) 4.5 mL 6   tamsulosin (FLOMAX) 0.4 MG CAPS capsule TAKE 1 CAPSULE BY MOUTH EVERY DAY AFTER SUPPER. 90 capsule 1   No facility-administered  medications prior to visit.    No Known Allergies  ROS As per HPI  PE: Vitals with BMI 04/07/2021 01/04/2021 10/02/2020  Height '5\' 6"'$  '5\' 6"'$  '5\' 6"'$   Weight 176 lbs 13 oz 169 lbs 10 oz 164 lbs 3 oz  BMI 28.55 [id number]  Systolic [id number]  Diastolic 68 66 69  Pulse 60 54 68  Gen: Alert, well appearing.  Patient is oriented to person, place, time, and situation. AFFECT: pleasant, lucid thought and speech. CV: RRR, no m/r/g.   LUNGS: CTA bilat, nonlabored resps, good aeration in all lung fields. EXT: no clubbing or cyanosis.  no edema.    LABS:  Lab Results  Component Value Date   TSH 0.96 08/26/2015   Lab Results  Component Value Date   WBC 7.2 10/02/2020   HGB 15.9 10/02/2020   HCT 45.7 10/02/2020   MCV 90.5 10/02/2020   PLT 131.0 (L) 10/02/2020   Lab Results  Component Value Date   CREATININE 1.06 01/04/2021   BUN 14 01/04/2021   NA 139 01/04/2021   K 4.4 01/04/2021   CL 105 01/04/2021   CO2 26 01/04/2021   Lab Results  Component Value Date   ALT 13 10/02/2020   AST 14 10/02/2020   ALKPHOS 68 10/02/2020   BILITOT 0.7 10/02/2020   Lab Results  Component Value Date   CHOL 82 10/02/2020   Lab Results  Component Value Date   HDL 29.20 (L) 10/02/2020   Lab Results  Component Value Date   LDLCALC 38 10/02/2020   Lab Results  Component Value Date   TRIG 76.0 10/02/2020   Lab Results  Component Value Date   CHOLHDL 3 10/02/2020   Lab Results  Component Value Date   PSA 3.82 01/08/2021   PSA 2.48 08/23/2019   PSA 2.42 03/05/2019   Lab Results  Component Value Date   HGBA1C 7.0 (H) 01/04/2021   IMPRESSION AND PLAN:  1) DM 2 with complications (CAD/PAD): control not as good as his usual the last 3 wks. Tighten up on diet. Hba1c, urine microalb/cr, and lytes/cr today.   2) HTN: good control on lisinopril '10mg'$  qd and atenolol 50 mg qd. Lytes/cr today.  3) Hyperchol: LDL was 38 about 3 mo ago. Cont atorva 80 mg qd. FLP and hepatic panel  today.   4) Chronic anticoag (low dose xarelto) for PAD, per vasc MD. No overt signs of bleeding. CBC monitoring today.   An After Visit Summary was printed and given to the patient.  FOLLOW UP: Return in about 3 months (around 07/07/2021) for routine chronic illness f/u. Next cpe 09/2021  Signed:  Crissie Sickles, MD           04/07/2021

## 2021-04-08 NOTE — Progress Notes (Signed)
Noted  

## 2021-04-30 DIAGNOSIS — Z8551 Personal history of malignant neoplasm of bladder: Secondary | ICD-10-CM | POA: Diagnosis not present

## 2021-05-14 ENCOUNTER — Other Ambulatory Visit: Payer: Self-pay | Admitting: Family Medicine

## 2021-05-17 ENCOUNTER — Ambulatory Visit (INDEPENDENT_AMBULATORY_CARE_PROVIDER_SITE_OTHER): Payer: Medicare Other | Admitting: Family Medicine

## 2021-05-17 ENCOUNTER — Encounter: Payer: Self-pay | Admitting: Family Medicine

## 2021-05-17 ENCOUNTER — Other Ambulatory Visit: Payer: Self-pay

## 2021-05-17 VITALS — BP 132/80 | HR 62 | Temp 98.0°F | Ht 66.0 in | Wt 177.4 lb

## 2021-05-17 DIAGNOSIS — M25562 Pain in left knee: Secondary | ICD-10-CM

## 2021-05-17 DIAGNOSIS — M1712 Unilateral primary osteoarthritis, left knee: Secondary | ICD-10-CM

## 2021-05-17 MED ORDER — TRIAMCINOLONE ACETONIDE 40 MG/ML IJ SUSP
40.0000 mg | Freq: Once | INTRAMUSCULAR | Status: AC
  Administered 2021-05-17: 40 mg via INTRAMUSCULAR

## 2021-05-17 NOTE — Progress Notes (Addendum)
OFFICE VISIT  05/17/2021  CC: Left knee pain   HPI:    Patient is a 65 y.o. male who presents with pain in his left knee. The pain has been persistent for 3 days and he describes it as burning and stabbing. It is alleviated by rest and exacerbated with use and ranges from 3/10 to 10/10. 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  Some swelling and dec ROM.  ROS: no fever, no nausea, no malaise or fatigue No redness over knee  Past Medical History:  Diagnosis Date   Arthritis    Ascending aorta dilatation ([place])    a. Ascending aorta mildly dilated by echo 09/03/15.   Bladder cancer [facility]) 2012   Mass removed, then BCG by urologist (Dr. Alinda Money).  Recurrence documented on cysto + bladder washings 10/14/15--getting more maintenance BCG.  Cysto 01/20/16 OK, then more BCG given.  Cysto 04/13/16 c/w just having finished BCG but cytology/FISH ok.  Cysto 07/27/2016 c/w small area of BCG related inflamm, BCG stopped due to severe sx's. 12/2020 cysto benign   Bladder cancer ([place])    Surveillance cysto 11/02/16 cysto showed 1.5 cm raised erythematous lesion->bx benign. Subsequent cystoscopies unchanged, most recent 07/2019.   BPH (benign prostatic hyperplasia)    with hx of acute prostatitis   CAD (coronary artery disease)    a. '05 MI > DES to circumflex. b. NSTEMI 08/2015 with total occlusion of the mid LAD along with 20% mid-distal Cx, 100% dCX, 40% D1, 50% pCx, 50% m-dRCA, 80% dRCA.Given >12 hours out from symptom onset, medical therapy recommended.   Diabetes mellitus type 2, controlled ([place])    HbA1c 02/2011 was 6.6%   DVT (deep venous thrombosis) (HCC)    Elevated PSA 09/2011   with prostate nodule.  Prostate bx ALL BENIGN 10/10/11 (Dr. Christella Hartigan followed by Urol--improved to 1.18 Jun 2017.  Nodule NOT palpable anymore on DRE at [facility] 01/2018 office f/u. Stable as of 08/2019. Slight incease 12/2020, urol to rpt 1 yr.   GERD (gastroesophageal reflux disease)     occasional   Headache(784.0)    cluster headaches a long time ago   History of adenomatous polyp of colon 09/23/2020   recall 09/2025   History of bronchitis    History of vertigo    HTN (hypertension)    Hyperlipidemia    Ischemic cardiomyopathy    a. 2D Echo 09/03/15: mild LVH, EF 40-45%, diffuse HK, akinesis of apical myocardium, grade 1 DD, aortic root dimention 65mm, ascending aorta mildly dilated, mildly dilated RV/RA.    Myocardial infarction [facility]) 2005; 08/2015   followed by Dr. Stanford Breed   PAD (peripheral artery disease) ([place]) 06/2019   R LL abnl ABI and doppler->see PSH section of EMR for details.   Thrombocytopenia ([place])    Stable 2012-2017   Tobacco abuse    Wolf-Parkinson-White syndrome     Past Surgical History:  Procedure Laterality Date   BLADDER TUMOR EXCISION  11/08/10; 05/2015   urothelial carcinoma (Dr. Alinda Money).  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   CARDIAC CATHETERIZATION  2005   with stent placement   CARDIAC CATHETERIZATION N/A 09/01/2015   Procedure: Left Heart Cath and Coronary Angiography;  Surgeon: Belva Crome, MD;  Location: [place] CV [facility];  Service: Cardiovascular;  Laterality: N/A;   COLONOSCOPY  2010; 09/23/20   2010 Dr. Isa Rankin. 09/2020 Dr. Rose Fillers 09/2025.   CYSTOSCOPY W/ RETROGRADES Bilateral 06/22/2015  Procedure: CYSTOSCOPY WITH RETROGRADE PYELOGRAM;  Surgeon: Raynelle Bring, MD;  [facility];  Service: Urology;  Laterality: Bilateral;   CYSTOSCOPY W/ RETROGRADES N/A 11/17/2016   Procedure: CYSTOSCOPY WITH RETROGRADE PYELOGRAM;  Surgeon: Raynelle Bring, MD;  [facility];  Service: Urology;  Laterality: N/A;   CYSTOSCOPY WITH RETROGRADE PYELOGRAM, URETEROSCOPY AND STENT PLACEMENT N/A 06/06/2013   Procedure: CYSTOSCOPY WITH RETROGRADE PYELOGRAM;  Surgeon: Dutch Gray, MD;  [facility];  Service: Urology;  Laterality: N/A;   LE ABI vascular studies  06/2019   R LL  claudication + evidence of PAD (ABI very low AND popliteal and peroneal artery occlusion on dopler u/s) in R leg (L leg fine)->referred to Dr. Fletcher Anon.  Conservative mgmt with walking program and low dose xarelto first, then consider revascularization.   lexiscan  2012   Normal/negative   LUMBAR SPINE [facility] in [place] ([place])-no trouble since last surgery   myocardial perfusion study  2009   Neg ischemia, EF 52%   TRANSURETHRAL RESECTION OF BLADDER TUMOR N/A 06/06/2013   Procedure: TRANSURETHRAL RESECTION OF BLADDER TUMOR (TURBT);  Surgeon: Dutch Gray, MD;  [facility];  Service: Urology;  Laterality: N/A;.  PATH: squamous metaplasia, fibrosis with chronic inflammation, no malignancy.   TRANSURETHRAL RESECTION OF BLADDER TUMOR N/A 06/22/2015   Carcinoma in situ: no invasive malignancy.  Procedure: TRANSURETHRAL RESECTION OF BLADDER TUMOR (TURBT);  Surgeon: Raynelle Bring, MD;  [facility];  Service: Urology;  Laterality: N/A;  **BLADDER BIOPSY*RESECTION**   TRANSURETHRAL RESECTION OF BLADDER TUMOR N/A 11/17/2016   Path: BENIGN UROTHELIUM WITH INFLAMMATION--no malignancy. Procedure: TRANSURETHRAL RESECTION OF BLADDER TUMOR (TURBT);  Surgeon: Raynelle Bring, MD;  [facility];  Service: Urology;  Laterality: N/A;   VASECTOMY      Outpatient Medications Prior to Visit  Medication Sig Dispense Refill   acetaminophen (TYLENOL) 500 MG tablet Take 1,000 mg by mouth daily as needed for mild pain or headache.     aspirin EC 81 MG tablet Take 81 mg by mouth daily.     atenolol (TENORMIN) 50 MG tablet TAKE 1 TABLET BY MOUTH EVERY DAY WITH LUNCH 90 tablet 0   atorvastatin (LIPITOR) 80 MG tablet Take 1 tablet (80 mg total) by mouth every evening. 90 tablet 2   calcium carbonate (TUMS - DOSED IN MG ELEMENTAL CALCIUM) 500 MG chewable tablet Chew 1 tablet by mouth daily as needed for indigestion or heartburn.     glipiZIDE (GLUCOTROL XL) 10 MG 24 hr tablet Take 1 tablet  (10 mg total) by mouth daily with breakfast. 90 tablet 3   lisinopril (ZESTRIL) 10 MG tablet Take 1 tablet (10 mg total) by mouth daily. 90 tablet 3   metFORMIN (GLUCOPHAGE) 1000 MG tablet Take 1 tablet (1,000 mg total) by mouth 2 (two) times daily with a meal. 180 tablet 3   nitroGLYCERIN (NITROSTAT) 0.4 MG SL tablet PLACE 1 TABLET UNDER THE TONGUE EVERY 5 (FIVE) MINUTES AS NEEDED FOR CHEST PAIN (UP TO 3 DOSES). (Patient not taking: No sig reported) 25 tablet 3   rivaroxaban (XARELTO) 2.5 MG TABS tablet Take 1 tablet (2.5 mg total) by mouth 2 (two) times daily. 60 tablet 6   tamsulosin (FLOMAX) 0.4 MG CAPS capsule TAKE 1 CAPSULE BY MOUTH EVERY DAY AFTER SUPPER. 90 capsule 3   No facility-administered medications prior to visit.    No Known Allergies  ROS As per HPI  PE: Vitals with BMI 04/07/2021 01/04/2021 10/02/2020  Height 5\' 6"  5\' 6"  5\' 6"   Weight 176 lbs 13 oz 169 lbs 10 oz 164 lbs 3 oz  BMI 28.55 16.10 96.04  Systolic 540 981 191  Diastolic 68 66 69  Pulse 60 54 68   Physical Exam Constitutional:      Appearance: Normal appearance.  Cardiovascular:     Rate and Rhythm: Normal rate and regular rhythm.  Pulmonary:     Effort: Pulmonary effort is normal.     Breath sounds: Normal breath sounds.  Skin:    Comments: Mild swelling on left knee with tenderness to palpation medially   Neurological:     Mental Status: He is alert.  TTP medial L knee over joint line. ROM L knee limited to 90 flexion.  Extension fully intact. Joint w/out warmth or erythema.   Minimal visible L knee enlargement, particularly bony hypertrophy.  Ultrasound of left knee: Suprapatellar pouch with simple fluid present Med meniscus irregular, question of tear superimposed on degenerative changes. MCL intact, w/out edema.  No Pes anserine bursitis.  Quad and patellar tendons intact. Color doppler does not show increased vascularity  LABS:  None today  IMPRESSION AND PLAN: Unilateral (left) knee  pain after extended kneeling, improvement of pain after rest, and joint swelling is most consistent with osteoarthritis of the knee. Possible medial meniscus degenerative tear. Pt unable to take NSAIDs d/t being on anticoagulant for hx of DVT. Pt w/out hx of abnormal bleeding.  Discussed risks/benefits of knee joint injection today and he elected to proceed. Patient received an injection of Triamcinolone for symptom improvement and he was advised to rest and ice his knee the next few days.    Procedure: Therapeutic knee injection with ultrasound guidance.  The patient's clinical condition is marked by substantial pain and/or significant functional disability.  Other conservative therapy has not provided relief, is contraindicated, or not appropriate.  There is a reasonable likelihood that injection will significantly improve the patient's pain and/or functional disability. Cleaned skin with betadine, used lateral approach to enter knee joint, needle visualized in SPP, Injected 49ml of kenalog 40 mg/ml + 2  ml of 1% plain lidocaine without resistance.  No immediate complications.  Patient tolerated procedure well.  Post-injection care discussed, including 20 min of icing 1-2 times in the next 4-8 hours, frequent non weight-bearing ROM exercises over the next few days, and general pain medication management.  An After Visit Summary was printed and given to the patient.  FOLLOW UP:  If pain continues to worsen or he notes increased swelling in knee or redness of knee.  Phil Dopp (MS3)  I personally was present during the history, physical exam, and medical decision-making activities of this service and have verified that the service and findings are accurately documented in the student's note.  Signed:  Crissie Sickles, MD           05/17/2021

## 2021-05-18 ENCOUNTER — Ambulatory Visit: Payer: Medicare Other | Admitting: Family Medicine

## 2021-06-01 ENCOUNTER — Other Ambulatory Visit: Payer: Self-pay

## 2021-06-01 ENCOUNTER — Ambulatory Visit: Payer: Medicare Other | Admitting: Cardiovascular Disease

## 2021-06-01 ENCOUNTER — Encounter: Payer: Self-pay | Admitting: Cardiovascular Disease

## 2021-06-01 VITALS — BP 136/74 | HR 60 | Ht 67.0 in | Wt 175.0 lb

## 2021-06-01 DIAGNOSIS — I739 Peripheral vascular disease, unspecified: Secondary | ICD-10-CM | POA: Diagnosis not present

## 2021-06-01 DIAGNOSIS — E785 Hyperlipidemia, unspecified: Secondary | ICD-10-CM | POA: Diagnosis not present

## 2021-06-01 DIAGNOSIS — I2510 Atherosclerotic heart disease of native coronary artery without angina pectoris: Secondary | ICD-10-CM | POA: Diagnosis not present

## 2021-06-01 DIAGNOSIS — I255 Ischemic cardiomyopathy: Secondary | ICD-10-CM | POA: Diagnosis not present

## 2021-06-01 NOTE — Patient Instructions (Signed)

## 2021-06-01 NOTE — Progress Notes (Signed)
Cardiology Office Note   Date:  06/01/2021   ID:  Mark Blankenship, DOB 22-Oct-1955, MRN [id number]  PCP:  Tammi Sou, MD  Cardiologist:   Dr. Stanford Breed  No chief complaint on file.     History of Present Illness: Mark Blankenship is a 65 y.o. male who is here today for follow-up visit regarding peripheral arterial disease.   The patient has known history of coronary artery disease with previous myocardial infarction with an occluded LAD with late presentation, ischemic cardiomyopathy with an EF of 40 to 45%, type 2 diabetes for at least 20 years, essential hypertension and hyperlipidemia.  He is not a smoker but does have strong family history of coronary artery disease.   He is followed for right calf claudication with moderately reduced ABI due to an occluded popliteal artery. He has been treated medically and his symptoms improved with walking.  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   He has been doing very well overall with no chest pain or shortness of breath.  He reports minimal right calf claudication.  He does have issues with both knee joints and required steroid injection on the left side recently. He pays $300 every 3 months for Xarelto when he goes into the donut hole.   Past Medical History:  Diagnosis Date   Arthritis    Ascending aorta dilatation ([place])    a. Ascending aorta mildly dilated by echo 09/03/15.   Bladder cancer [facility]) 2012   Mass removed, then BCG by urologist (Dr. Alinda Money).  Recurrence documented on cysto + bladder washings 10/14/15--getting more maintenance BCG.  Cysto 01/20/16 OK, then more BCG given.  Cysto 04/13/16 c/w just having finished BCG but cytology/FISH ok.  Cysto 07/27/2016 c/w small area of BCG related inflamm, BCG stopped due to severe sx's. 12/2020 cysto benign   Bladder cancer ([place])    Surveillance cysto 11/02/16 cysto showed 1.5 cm raised erythematous lesion->bx benign. Subsequent cystoscopies unchanged, most recent 07/2019 and 04/2021.    BPH (benign prostatic hyperplasia)    with hx of acute prostatitis   CAD (coronary artery disease)    a. '05 MI > DES to circumflex. b. NSTEMI 08/2015 with total occlusion of the mid LAD along with 20% mid-distal Cx, 100% dCX, 40% D1, 50% pCx, 50% m-dRCA, 80% dRCA.Given >12 hours out from symptom onset, medical therapy recommended.   Diabetes mellitus type 2, controlled ([place])    HbA1c 02/2011 was 6.6%   DVT (deep venous thrombosis) (HCC)    Elevated PSA 09/2011   with prostate nodule.  Prostate bx ALL BENIGN 10/10/11 (Dr. Christella Hartigan followed by Urol--improved to 1.18 Jun 2017.  Nodule NOT palpable anymore on DRE at [facility] 01/2018 office f/u. Stable as of 08/2019. Slight incease 12/2020, urol to rpt 1 yr.   GERD (gastroesophageal reflux disease)    occasional   Headache(784.0)    cluster headaches a long time ago   History of adenomatous polyp of colon 09/23/2020   recall 09/2025   History of bronchitis    History of vertigo    HTN (hypertension)    Hyperlipidemia    Ischemic cardiomyopathy    a. 2D Echo 09/03/15: mild LVH, EF 40-45%, diffuse HK, akinesis of apical myocardium, grade 1 DD, aortic root dimention 85mm, ascending aorta mildly dilated, mildly dilated RV/RA.    Myocardial infarction [facility]) 2005; 08/2015   followed by Dr. Stanford Breed   PAD (peripheral artery disease) ([place]) 06/2019   R LL abnl ABI and  doppler->see PSH section of EMR for details.   Thrombocytopenia ([place])    Stable 2012-2017   Tobacco abuse    Wolf-Parkinson-White syndrome     Past Surgical History:  Procedure Laterality Date   BLADDER TUMOR EXCISION  11/08/10; 05/2015   urothelial carcinoma (Dr. Alinda Money).  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   CARDIAC CATHETERIZATION  2005   with stent placement   CARDIAC CATHETERIZATION N/A 09/01/2015   Procedure: Left Heart Cath and Coronary Angiography;  Surgeon: Belva Crome, MD;  Location: [place] [facility];  Service: Cardiovascular;   Laterality: N/A;   COLONOSCOPY  2010; 09/23/20   2010 Dr. Isa Rankin. 09/2020 Dr. Rose Fillers 09/2025.   CYSTOSCOPY W/ RETROGRADES Bilateral 06/22/2015   Procedure: CYSTOSCOPY WITH RETROGRADE PYELOGRAM;  Surgeon: Raynelle Bring, MD;  [facility];  Service: Urology;  Laterality: Bilateral;   CYSTOSCOPY W/ RETROGRADES N/A 11/17/2016   Procedure: CYSTOSCOPY WITH RETROGRADE PYELOGRAM;  Surgeon: Raynelle Bring, MD;  [facility];  Service: Urology;  Laterality: N/A;   CYSTOSCOPY WITH RETROGRADE PYELOGRAM, URETEROSCOPY AND STENT PLACEMENT N/A 06/06/2013   Procedure: CYSTOSCOPY WITH RETROGRADE PYELOGRAM;  Surgeon: Dutch Gray, MD;  [facility];  Service: Urology;  Laterality: N/A;   LE ABI vascular studies  06/2019   R LL claudication + evidence of PAD (ABI very low AND popliteal and peroneal artery occlusion on dopler u/s) in R leg (L leg fine)->referred to Dr. Fletcher Anon.  Conservative mgmt with walking program and low dose xarelto first, then consider revascularization.   lexiscan  2012   Normal/negative   LUMBAR SPINE [facility] in [place] ([place])-no trouble since last surgery   myocardial perfusion study  2009   Neg ischemia, EF 52%   TRANSURETHRAL RESECTION OF BLADDER TUMOR N/A 06/06/2013   Procedure: TRANSURETHRAL RESECTION OF BLADDER TUMOR (TURBT);  Surgeon: Dutch Gray, MD;  [facility];  Service: Urology;  Laterality: N/A;.  PATH: squamous metaplasia, fibrosis with chronic inflammation, no malignancy.   TRANSURETHRAL RESECTION OF BLADDER TUMOR N/A 06/22/2015   Carcinoma in situ: no invasive malignancy.  Procedure: TRANSURETHRAL RESECTION OF BLADDER TUMOR (TURBT);  Surgeon: Raynelle Bring, MD;  [facility];  Service: Urology;  Laterality: N/A;  **BLADDER BIOPSY*RESECTION**   TRANSURETHRAL RESECTION OF BLADDER TUMOR N/A 11/17/2016   Path: BENIGN UROTHELIUM WITH INFLAMMATION--no malignancy. Procedure: TRANSURETHRAL RESECTION OF BLADDER TUMOR (TURBT);   Surgeon: Raynelle Bring, MD;  [facility];  Service: Urology;  Laterality: N/A;   VASECTOMY       Current Outpatient Medications  Medication Sig Dispense Refill   acetaminophen (TYLENOL) 500 MG tablet Take 1,000 mg by mouth daily as needed for mild pain or headache.     aspirin EC 81 MG tablet Take 81 mg by mouth daily.     atenolol (TENORMIN) 50 MG tablet TAKE 1 TABLET BY MOUTH EVERY DAY WITH LUNCH 90 tablet 0   atorvastatin (LIPITOR) 80 MG tablet Take 1 tablet (80 mg total) by mouth every evening. 90 tablet 2   calcium carbonate (TUMS - DOSED IN MG ELEMENTAL CALCIUM) 500 MG chewable tablet Chew 1 tablet by mouth daily as needed for indigestion or heartburn.     glipiZIDE (GLUCOTROL XL) 10 MG 24 hr tablet Take 1 tablet (10 mg total) by mouth daily with breakfast. 90 tablet 3   lisinopril (ZESTRIL) 10 MG tablet Take 1 tablet (10 mg total) by mouth daily. 90 tablet 3   metFORMIN (  GLUCOPHAGE) 1000 MG tablet Take 1 tablet (1,000 mg total) by mouth 2 (two) times daily with a meal. 180 tablet 3   nitroGLYCERIN (NITROSTAT) 0.4 MG SL tablet PLACE 1 TABLET UNDER THE TONGUE EVERY 5 (FIVE) MINUTES AS NEEDED FOR CHEST PAIN (UP TO 3 DOSES). 25 tablet 3   rivaroxaban (XARELTO) 2.5 MG TABS tablet Take 1 tablet (2.5 mg total) by mouth 2 (two) times daily. 60 tablet 6   tamsulosin (FLOMAX) 0.4 MG CAPS capsule TAKE 1 CAPSULE BY MOUTH EVERY DAY AFTER SUPPER. 90 capsule 3   No current facility-administered medications for this visit.    Allergies:   Patient has no known allergies.    Social History:  The patient  reports that he quit smoking about 5 years ago. His smoking use included cigarettes. He has a 10.00 pack-year smoking history. He has never used smokeless tobacco. He reports that he does not drink alcohol and does not use drugs.   Family History:  The patient's family history includes Cancer in his father; Diabetes in his brother; Heart disease in his brother and paternal uncle; Hypertension  in his father and mother; Multiple sclerosis in his brother.    ROS:  Please see the history of present illness.   Otherwise, review of systems are positive for none.   All other systems are reviewed and negative.    PHYSICAL EXAM: VS:  BP 136/74   Pulse 60   Ht 5\' 7"  (1.702 m)   Wt 175 lb (79.4 kg)   SpO2 98%   BMI 27.41 kg/m  , BMI Body mass index is 27.41 kg/m. GEN: Well nourished, well developed, in no acute distress  HEENT: normal  Neck: no JVD, carotid bruits, or masses Cardiac: RRR; no murmurs, rubs, or gallops,no edema  Respiratory:  clear to auscultation bilaterally, normal work of breathing GI: soft, nontender, nondistended, + BS MS: no deformity or atrophy  Skin: warm and dry, no rash Neuro:  Strength and sensation are intact Psych: euthymic mood, full affect   EKG:  EKG is ordered today. EKG showed normal sinus rhythm with no significant ST or T wave changes.    Recent Labs: 04/07/2021: ALT 22; BUN 18; Creatinine, Ser 1.08; Hemoglobin 15.7; Platelets 123.0; Potassium 4.3; Sodium 138    Lipid Panel    Component Value Date/Time   CHOL 105 04/07/2021 0858   TRIG 97.0 04/07/2021 0858   HDL 33.70 (L) 04/07/2021 0858   CHOLHDL 3 04/07/2021 0858   VLDL 19.4 04/07/2021 0858   LDLCALC 52 04/07/2021 0858   LDLDIRECT 87.2 09/03/2010 1110      Wt Readings from Last 3 Encounters:  06/01/21 175 lb (79.4 kg)  05/17/21 177 lb 6.4 oz (80.5 kg)  04/07/21 176 lb 12.8 oz (80.2 kg)        No flowsheet data found.    ASSESSMENT AND PLAN:  1.  Peripheral arterial disease with  right calf claudication: due to an occluded right popliteal artery.  His symptoms gradually improved and at the present time he has minimal right calf claudication.  Recommend continuing medical therapy.  Continue low-dose Xarelto twice daily.  If cost becomes an issue, we could consider switching him to clopidogrel.  He will let me know when he goes for his next refill.    2.  Coronary  artery disease involving native coronary arteries without angina: Continue medical therapy.  3.  Mild ischemic cardiomyopathy: No signs of volume overload.  4.  Hyperlipidemia: I reviewed his  most recent lipid profile done in September which showed an LDL of 52 and triglyceride of 97.  Both at target.  Continue high-dose atorvastatin.   Disposition:   FU with me in 12 months  Signed,  Kathlyn Sacramento, MD  06/01/2021 9:39 AM    [facility]

## 2021-06-23 ENCOUNTER — Other Ambulatory Visit: Payer: Self-pay

## 2021-06-23 ENCOUNTER — Ambulatory Visit (INDEPENDENT_AMBULATORY_CARE_PROVIDER_SITE_OTHER): Payer: Medicare Other | Admitting: Family Medicine

## 2021-06-23 VITALS — BP 117/74 | HR 63 | Temp 97.9°F | Ht 67.0 in | Wt 182.6 lb

## 2021-06-23 DIAGNOSIS — R051 Acute cough: Secondary | ICD-10-CM

## 2021-06-23 MED ORDER — HYDROCODONE BIT-HOMATROP MBR 5-1.5 MG/5ML PO SOLN: ORAL | 0 refills | Status: DC

## 2021-06-23 NOTE — Progress Notes (Signed)
OFFICE VISIT  06/23/2021  CC:  Chief Complaint  Patient presents with   Sore Throat    Started yesterday. Pt also c/o cough and headache. Pt tried Delsym, Nightquil, Robitussin, tylenol severe cold and flu, Theraflu.    HPI:    Patient is a 65 y.o. male who presents for cough.  INTERIM HX: Jairo started getting a cough and nasal congestion over a week ago.  No fever or shortness of breath or face pain.  Some postnasal drip causing some sore throat.  Left ear feeling full and left side of throat hurting last night some, took Tylenol and it helped eventually.  His wife got concerned about that and had him come in. He feels better today, like he is getting over it.  Cough is much better. He has taken some Robitussin and it helps some for a while.  Delsym no help.  Past Medical History:  Diagnosis Date   Arthritis    Ascending aorta dilatation ([place])    a. Ascending aorta mildly dilated by echo 09/03/15.   Bladder cancer [facility]) 2012   Mass removed, then BCG by urologist (Dr. Alinda Money).  Recurrence documented on cysto + bladder washings 10/14/15--getting more maintenance BCG.  Cysto 01/20/16 OK, then more BCG given.  Cysto 04/13/16 c/w just having finished BCG but cytology/FISH ok.  Cysto 07/27/2016 c/w small area of BCG related inflamm, BCG stopped due to severe sx's. 12/2020 cysto benign   Bladder cancer ([place])    Surveillance cysto 11/02/16 cysto showed 1.5 cm raised erythematous lesion->bx benign. Subsequent cystoscopies unchanged, most recent 07/2019 and 04/2021.   BPH (benign prostatic hyperplasia)    with hx of acute prostatitis   CAD (coronary artery disease)    a. '05 MI > DES to circumflex. b. NSTEMI 08/2015 with total occlusion of the mid LAD along with 20% mid-distal Cx, 100% dCX, 40% D1, 50% pCx, 50% m-dRCA, 80% dRCA.Given >12 hours out from symptom onset, medical therapy recommended.   Diabetes mellitus type 2, controlled ([place])    HbA1c 02/2011 was 6.6%   DVT (deep venous thrombosis)  (H[facility])    Elevated PSA 09/2011   with prostate nodule.  Prostate bx ALL BENIGN 10/10/11 (Dr. Christella Hartigan followed by Urol--improved to 1.18 Jun 2017.  Nodule NOT palpable anymore on DRE at [facility] 01/2018 office f/u. Stable as of 08/2019. Slight incease 12/2020, urol to rpt 1 yr.   GERD (gastroesophageal reflux disease)    occasional   Headache(784.0)    cluster headaches a long time ago   History of adenomatous polyp of colon 09/23/2020   recall 09/2025   History of bronchitis    History of vertigo    HTN (hypertension)    Hyperlipidemia    Ischemic cardiomyopathy    a. 2D Echo 09/03/15: mild LVH, EF 40-45%, diffuse HK, akinesis of apical myocardium, grade 1 DD, aortic root dimention 53mm, ascending aorta mildly dilated, mildly dilated RV/RA.    Myocardial infarction [facility]) 2005; 08/2015   followed by Dr. Stanford Breed   PAD (peripheral artery disease) ([place]) 06/2019   R LL abnl ABI and doppler->see PSH section of EMR for details.   Thrombocytopenia ([place])    Stable 2012-2017   Tobacco abuse    Wolf-Parkinson-White syndrome     Past Surgical History:  Procedure Laterality Date   BLADDER TUMOR EXCISION  11/08/10; 05/2015   urothelial carcinoma (Dr. Alinda Money).  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   CARDIAC CATHETERIZATION  2005  with stent placement   CARDIAC CATHETERIZATION N/A 09/01/2015   Procedure: Left Heart Cath and Coronary Angiography;  Surgeon: Belva Crome, MD;  Location: [place] CV [facility];  Service: Cardiovascular;  Laterality: N/A;   COLONOSCOPY  2010; 09/23/20   2010 Dr. Isa Rankin. 09/2020 Dr. Rose Fillers 09/2025.   CYSTOSCOPY W/ RETROGRADES Bilateral 06/22/2015   Procedure: CYSTOSCOPY WITH RETROGRADE PYELOGRAM;  Surgeon: Raynelle Bring, MD;  [facility];  Service: Urology;  Laterality: Bilateral;   CYSTOSCOPY W/ RETROGRADES N/A 11/17/2016   Procedure: CYSTOSCOPY WITH RETROGRADE PYELOGRAM;  Surgeon: Raynelle Bring, MD;  [facility];  Service: Urology;  Laterality: N/A;   CYSTOSCOPY WITH RETROGRADE PYELOGRAM, URETEROSCOPY AND STENT PLACEMENT N/A 06/06/2013   Procedure: CYSTOSCOPY WITH RETROGRADE PYELOGRAM;  Surgeon: Dutch Gray, MD;  [facility];  Service: Urology;  Laterality: N/A;   LE ABI vascular studies  06/2019   R LL claudication + evidence of PAD (ABI very low AND popliteal and peroneal artery occlusion on dopler u/s) in R leg (L leg fine)->referred to Dr. Fletcher Anon.  Conservative mgmt with walking program and low dose xarelto first, then consider revascularization.   lexiscan  2012   Normal/negative   LUMBAR SPINE [place] in [place] ([place])-no trouble since last surgery   myocardial perfusion study  2009   Neg ischemia, EF 52%   TRANSURETHRAL RESECTION OF BLADDER TUMOR N/A 06/06/2013   Procedure: TRANSURETHRAL RESECTION OF BLADDER TUMOR (TURBT);  Surgeon: Dutch Gray, MD;  [facility];  Service: Urology;  Laterality: N/A;.  PATH: squamous metaplasia, fibrosis with chronic inflammation, no malignancy.   TRANSURETHRAL RESECTION OF BLADDER TUMOR N/A 06/22/2015   Carcinoma in situ: no invasive malignancy.  Procedure: TRANSURETHRAL RESECTION OF BLADDER TUMOR (TURBT);  Surgeon: Raynelle Bring, MD;  [facility];  Service: Urology;  Laterality: N/A;  **BLADDER BIOPSY*RESECTION**   TRANSURETHRAL RESECTION OF BLADDER TUMOR N/A 11/17/2016   Path: BENIGN UROTHELIUM WITH INFLAMMATION--no malignancy. Procedure: TRANSURETHRAL RESECTION OF BLADDER TUMOR (TURBT);  Surgeon: Raynelle Bring, MD;  [facility];  Service: Urology;  Laterality: N/A;   VASECTOMY      Outpatient Medications Prior to Visit  Medication Sig Dispense Refill   acetaminophen (TYLENOL) 500 MG tablet Take 1,000 mg by mouth daily as needed for mild pain or headache.     aspirin EC 81 MG tablet Take 81 mg by mouth daily.     atenolol (TENORMIN) 50 MG tablet TAKE 1 TABLET BY MOUTH EVERY DAY WITH LUNCH 90 tablet 0    atorvastatin (LIPITOR) 80 MG tablet Take 1 tablet (80 mg total) by mouth every evening. 90 tablet 2   calcium carbonate (TUMS - DOSED IN MG ELEMENTAL CALCIUM) 500 MG chewable tablet Chew 1 tablet by mouth daily as needed for indigestion or heartburn.     glipiZIDE (GLUCOTROL XL) 10 MG 24 hr tablet Take 1 tablet (10 mg total) by mouth daily with breakfast. 90 tablet 3   lisinopril (ZESTRIL) 10 MG tablet Take 1 tablet (10 mg total) by mouth daily. 90 tablet 3   metFORMIN (GLUCOPHAGE) 1000 MG tablet Take 1 tablet (1,000 mg total) by mouth 2 (two) times daily with a meal. 180 tablet 3   rivaroxaban (XARELTO) 2.5 MG TABS tablet Take 1 tablet (2.5 mg total) by mouth 2 (two) times daily. 60 tablet 6   tamsulosin (FLOMAX) 0.4 MG CAPS capsule TAKE 1 CAPSULE BY MOUTH EVERY DAY AFTER SUPPER. 90 capsule 3   nitroGLYCERIN (NITROSTAT)  0.4 MG SL tablet PLACE 1 TABLET UNDER THE TONGUE EVERY 5 (FIVE) MINUTES AS NEEDED FOR CHEST PAIN (UP TO 3 DOSES). (Patient not taking: Reported on 06/23/2021) 25 tablet 3   No facility-administered medications prior to visit.    No Known Allergies  ROS As per HPI  PE: Vitals with BMI 06/23/2021 06/01/2021 05/17/2021  Height 5\' 7"  5\' 7"  5\' 6"   Weight 182 lbs 10 oz 175 lbs 177 lbs 6 oz  BMI 28.59 92.3 30.07  Systolic 622 633 354  Diastolic 74 74 80  Pulse 63 60 62    VS: noted--normal. Gen: alert, NAD, NONTOXIC APPEARING. HEENT: eyes without injection, drainage, or swelling.  Ears: EACs full of cerumen bilaterally but beyond the reach of a curette.  Unable to see TMs.  Nose: Scant clear rhinorrhea, with some dried, crusty exudate adherent to mildly injected mucosa.  No purulent d/c.  No paranasal sinus TTP.  No facial swelling.  Throat and mouth without focal lesion.  No pharyngial swelling, erythema, or exudate.   Neck: supple, no LAD.  Mild tenderness to palpation over left anterolateral cervical node area.  I do not feel any distinct enlarged node, though. LUNGS: CTA  bilat, nonlabored resps.   CV: RRR, no m/r/g. EXT: no c/c/e SKIN: no rash  LABS:  none  IMPRESSION AND PLAN:  URI with cough and congestion.  This is starting to get better at this point. Ears are quite full of wax and he says he has something he put sentiment at home to help it drain out.  I offered irrigate them today but he declined. We discussed treatment of his cough and decided to do a few days of Hycodan syrup, 1 to 2 teaspoons twice daily as needed, 120 mils.   An After Visit Summary was printed and given to the patient.  FOLLOW UP: No follow-ups on file.  Signed:  Crissie Sickles, MD           06/23/2021

## 2021-07-07 ENCOUNTER — Ambulatory Visit: Payer: Medicare Other | Admitting: Family Medicine

## 2021-07-23 NOTE — Progress Notes (Signed)
HPI: FU coronary disease status post drug-eluting stent to the circumflex in August 2005. Patient admitted 2/17 with myocardial infarction. Cardiac catheterization February 2017 showed an occluded distal circumflex. The mid to distal LAD was occluded as well. Ejection fraction 40-50%. Patient was treated medically because of late presentation. Echocardiogram February 2017 showed ejection fraction 40-45% with akinesis of the apical myocardium. Mild right atrial and right ventricular enlargement.  Lower extremity Dopplers December 2020 showed occlusion of the popliteal artery on the right, occlusion of the peroneal artery on the right and no significant focal stenosis on the left; ABI moderate on the right.  Patient seen by Dr. Fletcher Anon and walking program recommended.  If symptoms persisted angiography and intervention could be considered.  Abdominal ultrasound December 2021 showed no aneurysm.  Since I last saw him, the patient denies any dyspnea on exertion, orthopnea, PND, pedal edema, palpitations, syncope or chest pain.   Current Outpatient Medications  Medication Sig Dispense Refill   acetaminophen (TYLENOL) 500 MG tablet Take 1,000 mg by mouth daily as needed for mild pain or headache.     aspirin EC 81 MG tablet Take 81 mg by mouth daily.     atenolol (TENORMIN) 50 MG tablet TAKE 1 TABLET BY MOUTH EVERY DAY WITH LUNCH 90 tablet 0   atorvastatin (LIPITOR) 80 MG tablet Take 1 tablet (80 mg total) by mouth every evening. 90 tablet 2   calcium carbonate (TUMS - DOSED IN MG ELEMENTAL CALCIUM) 500 MG chewable tablet Chew 1 tablet by mouth daily as needed for indigestion or heartburn.     glipiZIDE (GLUCOTROL XL) 10 MG 24 hr tablet Take 1 tablet (10 mg total) by mouth daily with breakfast. 90 tablet 3   HYDROcodone bit-homatropine (HYCODAN) 5-1.5 MG/5ML syrup 1-2 tsp po bid prn cough 120 mL 0   lisinopril (ZESTRIL) 10 MG tablet Take 1 tablet (10 mg total) by mouth daily. 90 tablet 3   metFORMIN  (GLUCOPHAGE) 1000 MG tablet Take 1 tablet (1,000 mg total) by mouth 2 (two) times daily with a meal. 180 tablet 3   nitroGLYCERIN (NITROSTAT) 0.4 MG SL tablet PLACE 1 TABLET UNDER THE TONGUE EVERY 5 (FIVE) MINUTES AS NEEDED FOR CHEST PAIN (UP TO 3 DOSES). 25 tablet 3   rivaroxaban (XARELTO) 2.5 MG TABS tablet TAKE 1 TABLET BY MOUTH TWICE DAILY 180 tablet 1   tamsulosin (FLOMAX) 0.4 MG CAPS capsule TAKE 1 CAPSULE BY MOUTH EVERY DAY AFTER SUPPER. 90 capsule 3   No current facility-administered medications for this visit.     Past Medical History:  Diagnosis Date   Arthritis    Ascending aorta dilatation ([place])    a. Ascending aorta mildly dilated by echo 09/03/15.   Bladder cancer [facility]) 2012   Mass removed, then BCG by urologist (Dr. Alinda Money).  Recurrence documented on cysto + bladder washings 10/14/15--getting more maintenance BCG.  Cysto 01/20/16 OK, then more BCG given.  Cysto 04/13/16 c/w just having finished BCG but cytology/FISH ok.  Cysto 07/27/2016 c/w small area of BCG related inflamm, BCG stopped due to severe sx's. 12/2020 cysto benign   Bladder cancer ([place])    Surveillance cysto 11/02/16 cysto showed 1.5 cm raised erythematous lesion->bx benign. Subsequent cystoscopies unchanged, most recent 07/2019 and 04/2021.   BPH (benign prostatic hyperplasia)    with hx of acute prostatitis   CAD (coronary artery disease)    a. '05 MI > DES to circumflex. b. NSTEMI 08/2015 with total occlusion of the mid  LAD along with 20% mid-distal Cx, 100% dCX, 40% D1, 50% pCx, 50% m-dRCA, 80% dRCA.Given >12 hours out from symptom onset, medical therapy recommended.   Diabetes mellitus type 2, controlled ([place])    HbA1c 02/2011 was 6.6%   DVT (deep venous thrombosis) ([facility])    Elevated PSA 09/2011   with prostate nodule.  Prostate bx ALL BENIGN 10/10/11 (Dr. Christella Hartigan followed by Urol--improved to 1.18 Jun 2017.  Nodule NOT palpable anymore on DRE at [facility] 01/2018 office f/u. Stable as of 08/2019. Slight incease 12/2020,  urol to rpt 1 yr.   GERD (gastroesophageal reflux disease)    occasional   Headache(784.0)    cluster headaches a long time ago   History of adenomatous polyp of colon 09/23/2020   recall 09/2025   History of bronchitis    History of vertigo    HTN (hypertension)    Hyperlipidemia    Ischemic cardiomyopathy    a. 2D Echo 09/03/15: mild LVH, EF 40-45%, diffuse HK, akinesis of apical myocardium, grade 1 DD, aortic root dimention 49mm, ascending aorta mildly dilated, mildly dilated RV/RA.    Myocardial infarction [facility]) 2005; 08/2015   followed by Dr. Stanford Breed   PAD (peripheral artery disease) ([place]) 06/2019   R LL abnl ABI and doppler->see PSH section of EMR for details.   Thrombocytopenia ([place])    Stable 2012-2017   Tobacco abuse    Wolf-Parkinson-White syndrome     Past Surgical History:  Procedure Laterality Date   BLADDER TUMOR EXCISION  11/08/10; 05/2015   urothelial carcinoma (Dr. Alinda Money).  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   CARDIAC CATHETERIZATION  2005   with stent placement   CARDIAC CATHETERIZATION N/A 09/01/2015   Procedure: Left Heart Cath and Coronary Angiography;  Surgeon: Belva Crome, MD;  Location: [place] CV LAB;  Service: Cardiovascular;  Laterality: N/A;   COLONOSCOPY  2010; 09/23/20   2010 Dr. Isa Rankin. 09/2020 Dr. Rose Fillers 09/2025.   CYSTOSCOPY W/ RETROGRADES Bilateral 06/22/2015   Procedure: CYSTOSCOPY WITH RETROGRADE PYELOGRAM;  Surgeon: Raynelle Bring, MD;  [facility];  Service: Urology;  Laterality: Bilateral;   CYSTOSCOPY W/ RETROGRADES N/A 11/17/2016   Procedure: CYSTOSCOPY WITH RETROGRADE PYELOGRAM;  Surgeon: Raynelle Bring, MD;  [facility];  Service: Urology;  Laterality: N/A;   CYSTOSCOPY WITH RETROGRADE PYELOGRAM, URETEROSCOPY AND STENT PLACEMENT N/A 06/06/2013   Procedure: CYSTOSCOPY WITH RETROGRADE PYELOGRAM;  Surgeon: Dutch Gray, MD;  [facility];  Service: Urology;   Laterality: N/A;   LE ABI vascular studies  06/2019   R LL claudication + evidence of PAD (ABI very low AND popliteal and peroneal artery occlusion on dopler u/s) in R leg (L leg fine)->referred to Dr. Fletcher Anon.  Conservative mgmt with walking program and low dose xarelto first, then consider revascularization.   lexiscan  2012   Normal/negative   LUMBAR SPINE [place] in [place] ([place])-no trouble since last surgery   myocardial perfusion study  2009   Neg ischemia, EF 52%   TRANSURETHRAL RESECTION OF BLADDER TUMOR N/A 06/06/2013   Procedure: TRANSURETHRAL RESECTION OF BLADDER TUMOR (TURBT);  Surgeon: Dutch Gray, MD;  [facility];  Service: Urology;  Laterality: N/A;.  PATH: squamous metaplasia, fibrosis with chronic inflammation, no malignancy.   TRANSURETHRAL RESECTION OF BLADDER TUMOR N/A 06/22/2015   Carcinoma in situ: no invasive malignancy.  Procedure: TRANSURETHRAL RESECTION OF BLADDER TUMOR (TURBT);  Surgeon: Raynelle Bring, MD;  Location: [facility];  Service: Urology;  Laterality: N/A;  **BLADDER BIOPSY*RESECTION**   TRANSURETHRAL RESECTION OF BLADDER TUMOR N/A 11/17/2016   Path: BENIGN UROTHELIUM WITH INFLAMMATION--no malignancy. Procedure: TRANSURETHRAL RESECTION OF BLADDER TUMOR (TURBT);  Surgeon: Raynelle Bring, MD;  [facility];  Service: Urology;  Laterality: N/A;   VASECTOMY      Social History   Socioeconomic History   Marital status: Married    Spouse name: Not on file   Number of children: Not on file   Years of education: Not on file   Highest education level: Some college, no degree  Occupational History   Not on file  Tobacco Use   Smoking status: Former    Packs/day: 0.25    Years: 40.00    Pack years: 10.00    Types: Cigarettes    Quit date: 09/05/2015    Years since quitting: 5.9   Smokeless tobacco: Never  Vaping Use   Vaping Use: Never used  Substance and Sexual Activity   Alcohol use: No    Alcohol/week: 0.0 standard  drinks   Drug use: No   Sexual activity: Not on file  Other Topics Concern   Not on file  Social History Narrative   Married, 2 children, 4 grandchildren--[place].   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   Tob: 30 pack-yr hx, quit 08/2011.   No alcohol or drug use.   Active on farm but no formal exercise regimen.   Social Determinants of Health   Financial Resource Strain: Low Risk    Difficulty of Paying Living Expenses: Not hard at all  Food Insecurity: No Food Insecurity   Worried About Charity fundraiser in the Last Year: Never true   [place] in the Last Year: Never true  Transportation Needs: No Transportation Needs   Lack of Transportation (Medical): No   Lack of Transportation (Non-Medical): No  Physical Activity: Insufficiently Active   Days of Exercise per Week: 2 days   Minutes of Exercise per Session: 20 min  Stress: No Stress Concern Present   Feeling of Stress : Not at all  Social Connections: Unknown   Frequency of Communication with Friends and Family: More than three times a week   Frequency of Social Gatherings with Friends and Family: Three times a week   Attends Religious Services: More than 4 times per year   Active Member of Clubs or Organizations: Not on file   Attends Archivist Meetings: Not on file   Marital Status: Married  Human resources officer Violence: Not on file    Family History  Problem Relation Age of Onset   Hypertension Mother    Cancer Father        colon and prostate, mets to lungs.   Hypertension Father    Heart disease Brother        d. 37 MI   Multiple sclerosis Brother    Diabetes Brother    Heart disease Paternal Uncle    Colon cancer Neg Hx    Colon polyps Neg Hx    Esophageal cancer Neg Hx    Stomach cancer Neg Hx     ROS: no fevers or chills, productive cough, hemoptysis, dysphasia,  odynophagia, melena, hematochezia, dysuria, hematuria, rash, seizure activity, orthopnea, PND, pedal edema, claudication. Remaining systems are negative.  Physical Exam: Well-developed well-nourished in no acute distress.  Skin is warm and dry.  HEENT is normal.  Neck is supple.  Chest is clear to auscultation with normal expansion.  Cardiovascular exam is regular rate and rhythm.  Abdominal exam nontender or distended. No masses palpated. Extremities show no edema. neuro grossly intact   A/P  1 coronary artery disease-patient doing well with no chest pain.  Continue medical therapy with aspirin and statin.  2 hypertension-blood pressure controlled.  Continue present medical regimen.  3 hyperlipidemia-continue statin.  4 ischemic cardiomyopathy-plan to repeat echocardiogram.  Continue ACE inhibitor and beta-blocker.  Previous ejection fraction 40 to 45%.  5 peripheral vascular disease-followed by Dr. Fletcher Anon.  Kirk Ruths, MD

## 2021-07-31 ENCOUNTER — Other Ambulatory Visit: Payer: Self-pay | Admitting: Family Medicine

## 2021-07-31 ENCOUNTER — Other Ambulatory Visit: Payer: Self-pay | Admitting: Cardiovascular Disease

## 2021-07-31 DIAGNOSIS — I2510 Atherosclerotic heart disease of native coronary artery without angina pectoris: Secondary | ICD-10-CM

## 2021-08-02 NOTE — Telephone Encounter (Signed)
Xarelto 2.5mg  refill request received. Pt is 66 years old, weight-82.8kg, Crea-1.08 on 04/07/21, last seen by Dr. Fletcher Anon on 05/29/2021, Diagnosis-PAD, CAD, DVT & per last OV note by Dr. Fletcher Anon "Continue low-dose Xarelto twice daily"  "If cost becomes an issue, we could consider switching him to clopidogrel", CrCl-78.66ml/min; Dose is appropriate based on dosing criteria. Will send in refill to requested pharmacy.

## 2021-08-03 ENCOUNTER — Other Ambulatory Visit: Payer: Self-pay

## 2021-08-03 ENCOUNTER — Encounter: Payer: Self-pay | Admitting: Cardiology

## 2021-08-03 ENCOUNTER — Ambulatory Visit: Payer: Medicare Other | Admitting: Cardiology

## 2021-08-03 VITALS — BP 136/76 | HR 77 | Ht 66.0 in | Wt 182.0 lb

## 2021-08-03 DIAGNOSIS — I10 Essential (primary) hypertension: Secondary | ICD-10-CM

## 2021-08-03 DIAGNOSIS — E785 Hyperlipidemia, unspecified: Secondary | ICD-10-CM

## 2021-08-03 DIAGNOSIS — I739 Peripheral vascular disease, unspecified: Secondary | ICD-10-CM

## 2021-08-03 DIAGNOSIS — I2510 Atherosclerotic heart disease of native coronary artery without angina pectoris: Secondary | ICD-10-CM

## 2021-08-03 DIAGNOSIS — I255 Ischemic cardiomyopathy: Secondary | ICD-10-CM | POA: Diagnosis not present

## 2021-08-03 NOTE — Patient Instructions (Signed)

## 2021-08-11 ENCOUNTER — Encounter: Payer: Self-pay | Admitting: Family Medicine

## 2021-08-11 ENCOUNTER — Ambulatory Visit (INDEPENDENT_AMBULATORY_CARE_PROVIDER_SITE_OTHER): Payer: Medicare Other | Admitting: Family Medicine

## 2021-08-11 ENCOUNTER — Other Ambulatory Visit: Payer: Self-pay

## 2021-08-11 VITALS — BP 122/70 | HR 59 | Temp 98.0°F | Ht 66.0 in | Wt 182.4 lb

## 2021-08-11 DIAGNOSIS — E7800 Pure hypercholesterolemia, unspecified: Secondary | ICD-10-CM

## 2021-08-11 DIAGNOSIS — H9311 Tinnitus, right ear: Secondary | ICD-10-CM

## 2021-08-11 DIAGNOSIS — H6121 Impacted cerumen, right ear: Secondary | ICD-10-CM

## 2021-08-11 DIAGNOSIS — I10 Essential (primary) hypertension: Secondary | ICD-10-CM | POA: Diagnosis not present

## 2021-08-11 DIAGNOSIS — H9191 Unspecified hearing loss, right ear: Secondary | ICD-10-CM

## 2021-08-11 DIAGNOSIS — E119 Type 2 diabetes mellitus without complications: Secondary | ICD-10-CM

## 2021-08-11 LAB — BASIC METABOLIC PANEL
BUN: 13 mg/dL (ref 6–23)
CO2: 26 mEq/L (ref 19–32)
Calcium: 9.2 mg/dL (ref 8.4–10.5)
Chloride: 104 mEq/L (ref 96–112)
Creatinine, Ser: 1.08 mg/dL (ref 0.40–1.50)
GFR: 71.67 mL/min (ref >60.00)
Glucose, Bld: 135 mg/dL — ABNORMAL HIGH (ref 70–99)
Potassium: 4.3 mEq/L (ref 3.5–5.1)
Sodium: 138 mEq/L (ref 135–145)

## 2021-08-11 LAB — HEMOGLOBIN A1C: Hgb A1c MFr Bld: 7.8 % — ABNORMAL HIGH (ref 4.6–6.5)

## 2021-08-11 NOTE — Progress Notes (Signed)
OFFICE VISIT  08/11/2021  CC:  Chief Complaint  Patient presents with   Follow-up    RCI; fasting   HPI:    Patient is a 66 y.o. male who presents for 4 mo f/u DM, HTN, HLD, and PAD (for which he is treated with low dose xarelto per vasc MD). A/P as of last visit: "1) DM 2 with complications (CAD/PAD): control not as good as his usual the last 3 wks. Tighten up on diet. Hba1c, urine microalb/cr, and lytes/cr today.   2) HTN: good control on lisinopril 10mg  qd and atenolol 50 mg qd. Lytes/cr today.   3) Hyperchol: LDL was 38 about 3 mo ago. Cont atorva 80 mg qd. FLP and hepatic panel today.   4) Chronic anticoag (low dose xarelto) for PAD, per vasc MD. No overt signs of bleeding. CBC monitoring today"  INTERIM HX: Labs all good last visit except A1c rose some to 7.7%.  No changes were made, he wanted to tighten up on diet and activity level. He had follow-up with his cardiologist, Dr. Stanford Breed, earlier this month.  All was stable.  His plan mentioned plan for updated echocardiogram but not clear when.  Pt doesn't have anything scheduled.  He is feeling well but is not happy about his sugars.  Fastings 130-140 typically.  Right ear with decreased hearing the last 3 weeks or so, some ringing in the ear lately.  He has some chronic hearing loss in the left ear but does not feel like this is changed recently. No pain.  No vertigo.  Past Medical History:  Diagnosis Date   Arthritis    Ascending aorta dilatation ([place])    a. Ascending aorta mildly dilated by echo 09/03/15.   Bladder cancer [facility]) 2012   Mass removed, then BCG by urologist (Dr. Alinda Money).  Recurrence documented on cysto + bladder washings 10/14/15--getting more maintenance BCG.  Cysto 01/20/16 OK, then more BCG given.  Cysto 04/13/16 c/w just having finished BCG but cytology/FISH ok.  Cysto 07/27/2016 c/w small area of BCG related inflamm, BCG stopped due to severe sx's. 12/2020 cysto benign   Bladder cancer ([place])     Surveillance cysto 11/02/16 cysto showed 1.5 cm raised erythematous lesion->bx benign. Subsequent cystoscopies unchanged, most recent 07/2019 and 04/2021.   BPH (benign prostatic hyperplasia)    with hx of acute prostatitis   CAD (coronary artery disease)    a. '05 MI > DES to circumflex. b. NSTEMI 08/2015 with total occlusion of the mid LAD along with 20% mid-distal Cx, 100% dCX, 40% D1, 50% pCx, 50% m-dRCA, 80% dRCA.Given >12 hours out from symptom onset, medical therapy recommended.   Diabetes mellitus type 2, controlled ([place])    HbA1c 02/2011 was 6.6%   DVT (deep venous thrombosis) ([facility])    Elevated PSA 09/2011   with prostate nodule.  Prostate bx ALL BENIGN 10/10/11 (Dr. Christella Hartigan followed by Urol--improved to 1.18 Jun 2017.  Nodule NOT palpable anymore on DRE at [facility] 01/2018 office f/u. Stable as of 08/2019. Slight incease 12/2020, urol to rpt 1 yr.   GERD (gastroesophageal reflux disease)    occasional   Headache(784.0)    cluster headaches a long time ago   History of adenomatous polyp of colon 09/23/2020   recall 09/2025   History of bronchitis    History of vertigo    HTN (hypertension)    Hyperlipidemia    Ischemic cardiomyopathy    a. 2D Echo 09/03/15: mild LVH, EF 40-45%, diffuse HK, akinesis  of apical myocardium, grade 1 DD, aortic root dimention 47mm, ascending aorta mildly dilated, mildly dilated RV/RA.    Myocardial infarction [facility]) 2005; 08/2015   followed by Dr. Stanford Breed   PAD (peripheral artery disease) ([place]) 06/2019   R LL abnl ABI and doppler->see PSH section of EMR for details.   Thrombocytopenia ([place])    Stable 2012-2017   Tobacco abuse    Wolf-Parkinson-White syndrome     Past Surgical History:  Procedure Laterality Date   BLADDER TUMOR EXCISION  11/08/10; 05/2015   urothelial carcinoma (Dr. Alinda Money).  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   CARDIAC CATHETERIZATION  2005   with stent placement   CARDIAC CATHETERIZATION  N/A 09/01/2015   Procedure: Left Heart Cath and Coronary Angiography;  Surgeon: Belva Crome, MD;  Location: [facility];  Service: Cardiovascular;  Laterality: N/A;   COLONOSCOPY  2010; 09/23/20   2010 Dr. Isa Rankin. 09/2020 Dr. Rose Fillers 09/2025.   CYSTOSCOPY W/ RETROGRADES Bilateral 06/22/2015   Procedure: CYSTOSCOPY WITH RETROGRADE PYELOGRAM;  Surgeon: Raynelle Bring, MD;  Location: [facility];  Service: Urology;  Laterality: Bilateral;   CYSTOSCOPY W/ RETROGRADES N/A 11/17/2016   Procedure: CYSTOSCOPY WITH RETROGRADE PYELOGRAM;  Surgeon: Raynelle Bring, MD;  Location: [facility];  Service: Urology;  Laterality: N/A;   CYSTOSCOPY WITH RETROGRADE PYELOGRAM, URETEROSCOPY AND STENT PLACEMENT N/A 06/06/2013   Procedure: CYSTOSCOPY WITH RETROGRADE PYELOGRAM;  Surgeon: Dutch Gray, MD;  Location: [facility];  Service: Urology;  Laterality: N/A;   LE ABI vascular studies  06/2019   R LL claudication + evidence of PAD (ABI very low AND popliteal and peroneal artery occlusion on dopler u/s) in R leg (L leg fine)->referred to Dr. Fletcher Anon.  Conservative mgmt with walking program and low dose xarelto first, then consider revascularization.   lexiscan  2012   Normal/negative   LUMBAR SPINE [place] in [place] ([place])-no trouble since last surgery   myocardial perfusion study  2009   Neg ischemia, EF 52%   TRANSURETHRAL RESECTION OF BLADDER TUMOR N/A 06/06/2013   Procedure: TRANSURETHRAL RESECTION OF BLADDER TUMOR (TURBT);  Surgeon: Dutch Gray, MD;  Location: [facility];  Service: Urology;  Laterality: N/A;.  PATH: squamous metaplasia, fibrosis with chronic inflammation, no malignancy.   TRANSURETHRAL RESECTION OF BLADDER TUMOR N/A 06/22/2015   Carcinoma in situ: no invasive malignancy.  Procedure: TRANSURETHRAL RESECTION OF BLADDER TUMOR (TURBT);  Surgeon: Raynelle Bring, MD;  Location: [facility];  Service: Urology;  Laterality: N/A;  **BLADDER BIOPSY*RESECTION**   TRANSURETHRAL  RESECTION OF BLADDER TUMOR N/A 11/17/2016   Path: BENIGN UROTHELIUM WITH INFLAMMATION--no malignancy. Procedure: TRANSURETHRAL RESECTION OF BLADDER TUMOR (TURBT);  Surgeon: Raynelle Bring, MD;  Location: [facility];  Service: Urology;  Laterality: N/A;   VASECTOMY      Outpatient Medications Prior to Visit  Medication Sig Dispense Refill   acetaminophen (TYLENOL) 500 MG tablet Take 1,000 mg by mouth daily as needed for mild pain or headache.     aspirin EC 81 MG tablet Take 81 mg by mouth daily.     atenolol (TENORMIN) 50 MG tablet TAKE 1 TABLET BY MOUTH EVERY DAY WITH LUNCH 90 tablet 0   atorvastatin (LIPITOR) 80 MG tablet Take 1 tablet (80 mg total) by mouth every evening. 90 tablet 2   calcium carbonate (TUMS - DOSED IN MG ELEMENTAL CALCIUM) 500 MG chewable tablet Chew 1 tablet by mouth daily as needed for indigestion or heartburn.  glipiZIDE (GLUCOTROL XL) 10 MG 24 hr tablet Take 1 tablet (10 mg total) by mouth daily with breakfast. 90 tablet 3   HYDROcodone bit-homatropine (HYCODAN) 5-1.5 MG/5ML syrup 1-2 tsp po bid prn cough 120 mL 0   lisinopril (ZESTRIL) 10 MG tablet Take 1 tablet (10 mg total) by mouth daily. 90 tablet 3   metFORMIN (GLUCOPHAGE) 1000 MG tablet Take 1 tablet (1,000 mg total) by mouth 2 (two) times daily with a meal. 180 tablet 3   nitroGLYCERIN (NITROSTAT) 0.4 MG SL tablet PLACE 1 TABLET UNDER THE TONGUE EVERY 5 (FIVE) MINUTES AS NEEDED FOR CHEST PAIN (UP TO 3 DOSES). 25 tablet 3   rivaroxaban (XARELTO) 2.5 MG TABS tablet TAKE 1 TABLET BY MOUTH TWICE DAILY 180 tablet 1   tamsulosin (FLOMAX) 0.4 MG CAPS capsule TAKE 1 CAPSULE BY MOUTH EVERY DAY AFTER SUPPER. 90 capsule 3   No facility-administered medications prior to visit.    No Known Allergies  ROS As per HPI  PE: Vitals with BMI 08/11/2021 08/03/2021 06/23/2021  Height 5\' 6"  5\' 6"  5\' 7"   Weight 182 lbs 6 oz 182 lbs 182 lbs 10 oz  BMI 29.45 38.18 29.93  Systolic 716 967 893  Diastolic 70 76 74  Pulse 59 77 63      Physical Exam  Gen: Alert, well appearing.  Patient is oriented to person, place, time, and situation. AFFECT: pleasant, lucid thought and speech. EARS: L EAC clear, TM normal. R EAC 100% obstructed with cerumen deep in EAC, cannot visualized TM.  LABS:  Last CBC Lab Results  Component Value Date   WBC 7.0 04/07/2021   HGB 15.7 04/07/2021   HCT 45.6 04/07/2021   MCV 91.1 04/07/2021   MCH 30.6 11/08/2016   RDW 14.2 04/07/2021   PLT 123.0 (L) 81/07/7508   Last metabolic panel Lab Results  Component Value Date   GLUCOSE 149 (H) 04/07/2021   NA 138 04/07/2021   K 4.3 04/07/2021   CL 104 04/07/2021   CO2 24 04/07/2021   BUN 18 04/07/2021   CREATININE 1.08 04/07/2021   GFRNONAA >60 11/08/2016   CALCIUM 9.2 04/07/2021   PROT 6.7 04/07/2021   ALBUMIN 4.3 04/07/2021   BILITOT 0.9 04/07/2021   ALKPHOS 66 04/07/2021   AST 20 04/07/2021   ALT 22 04/07/2021   ANIONGAP 9 11/08/2016   Last lipids Lab Results  Component Value Date   CHOL 105 04/07/2021   HDL 33.70 (L) 04/07/2021   LDLCALC 52 04/07/2021   LDLDIRECT 87.2 09/03/2010   TRIG 97.0 04/07/2021   CHOLHDL 3 04/07/2021   Last hemoglobin A1c Lab Results  Component Value Date   HGBA1C 7.7 (H) 04/07/2021   Last thyroid functions Lab Results  Component Value Date   TSH 0.96 08/26/2015     Lab Results  Component Value Date   PSA 3.82 01/08/2021   PSA 2.48 08/23/2019   PSA 2.42 03/05/2019   IMPRESSION AND PLAN:  #1 diabetes type 2 without complication. Control not as good as his usual. If A1c still up over 7% today then we will add another agent.  Insurance will not cover Ozempic. When we call him back about results will give him the names of some other GLP 1 agonists as well as some SGL 2 inhibitors and insulins to check with insurer about. Bmet in A1c today.  #2 hypertension.  Well-controlled.  Continue atenolol 50 mg a day and lisinopril 10 mg a day. Electrolytes and creatinine today.  Next  3.   Hyperlipidemia.  Tolerating atorvastatin 80 mg a day. LDL 52 three mo/ago. Plan repeat lipid panel and hepatic panel in 3 months.  4.  Coronary artery disease and peripheral artery disease. He continues aspirin, statin, and beta-blocker. Additionally, his vascular doctor has him on low-dose Eliquis.  No signs of bleeding.  #5 right ear cerumen impaction.  This is too deep in the canal for me to try to reach with a curette.   Patient elected for irrigation to attempt disimpaction today. Consent obtained. Procedure: Cerumen Disimpaction  Warm water was applied and gentle ear lavage performed on right.  Pt tolerated procedure well. There were no complications and the cerumen was completely evacuated. Tympanic membrane is intact following the procedure.  Auditory canals are normal.  The patient reported relief of symptoms after removal of cerumen    An After Visit Summary was printed and given to the patient.  FOLLOW UP: Return in about 3 months (around 11/09/2021) for annual CPE (fasting) +RCI. Cpe 3-4 mo  Signed:  Crissie Sickles, MD           08/11/2021

## 2021-08-13 ENCOUNTER — Telehealth: Payer: Self-pay

## 2021-08-13 MED ORDER — TRULICITY 0.75 MG/0.5ML ~~LOC~~ SOAJ: 0.7500 mg | SUBCUTANEOUS | 0 refills | Status: DC

## 2021-08-13 NOTE — Telephone Encounter (Signed)
Please review and advise.

## 2021-08-13 NOTE — Telephone Encounter (Signed)
Patient was to let Dr. Anitra Lauth what medication was covered by his insurance now.  He was taking Ozempic, insurance would not pay, he stopped taking. Insurance will pay for the following meds:   Ozempic Jardiance Trulicity  30 d/s = $92 90 d/s = $111

## 2021-08-13 NOTE — Telephone Encounter (Signed)
Trulicity sent in. This is a once a week injection. He will be on the starting dose for the next month.  Have him call after he takes his fourth dose to let us know if he has had any side effects and if no side effects will send in rx for the increased dose.

## 2021-08-13 NOTE — Telephone Encounter (Signed)
LM for pt to return call regarding medication 

## 2021-08-16 NOTE — Telephone Encounter (Signed)
MyChart message read.

## 2021-09-01 ENCOUNTER — Other Ambulatory Visit: Payer: Self-pay

## 2021-09-01 ENCOUNTER — Ambulatory Visit (INDEPENDENT_AMBULATORY_CARE_PROVIDER_SITE_OTHER): Payer: Medicare Other

## 2021-09-01 DIAGNOSIS — Z0000 Encounter for general adult medical examination without abnormal findings: Secondary | ICD-10-CM | POA: Diagnosis not present

## 2021-09-01 NOTE — Patient Instructions (Signed)
[name] , Thank you for taking time to come for your Medicare Wellness Visit. I appreciate your ongoing commitment to your health goals. Please review the following plan we discussed and let me know if I can assist you in the future.   Screening recommendations/referrals: Colonoscopy: Done 09/23/20 repeat every 3 years  Recommended yearly ophthalmology/optometry visit for glaucoma screening and checkup Recommended yearly dental visit for hygiene and checkup  Vaccinations: Influenza vaccine: Done 04/07/21 repeat very year Pneumococcal vaccine: Due 10/02/21  Tdap vaccine: Done 05/12/21 repeat every 10 years  Shingles vaccine: Completed 10/8, 07/22/20   Covid-19: Completed 3/5, 4/7, 05/20/20, 11/2, 02/18/21  Advanced directives: Advance directive discussed with you today. Even though you declined this today please call our office should you change your mind and we can give you the proper paperwork for you to fill out.  Conditions/risks identified: lose weight   Next appointment: Follow up in one year for your annual wellness visit.   Preventive Care 22 Years and Older, Male Preventive care refers to lifestyle choices and visits with your health care provider that can promote health and wellness. What does preventive care include? A yearly physical exam. This is also called an annual well check. Dental exams once or twice a year. Routine eye exams. Ask your health care provider how often you should have your eyes checked. Personal lifestyle choices, including: Daily care of your teeth and gums. Regular physical activity. Eating a healthy diet. Avoiding tobacco and drug use. Limiting alcohol use. Practicing safe sex. Taking low doses of aspirin every day. Taking vitamin and mineral supplements as recommended by your health care provider. What happens during an annual well check? The services and screenings done by your health care provider during your annual well check will depend on  your age, overall health, lifestyle risk factors, and family history of disease. Counseling  Your health care provider may ask you questions about your: Alcohol use. Tobacco use. Drug use. Emotional well-being. Home and relationship well-being. Sexual activity. Eating habits. History of falls. Memory and ability to understand (cognition). Work and work Statistician. Screening  You may have the following tests or measurements: Height, weight, and BMI. Blood pressure. Lipid and cholesterol levels. These may be checked every 5 years, or more frequently if you are over 96 years old. Skin check. Lung cancer screening. You may have this screening every year starting at age 31 if you have a 30-pack-year history of smoking and currently smoke or have quit within the past 15 years. Fecal occult blood test (FOBT) of the stool. You may have this test every year starting at age 64. Flexible sigmoidoscopy or colonoscopy. You may have a sigmoidoscopy every 5 years or a colonoscopy every 10 years starting at age 37. Prostate cancer screening. Recommendations will vary depending on your family history and other risks. Hepatitis C blood test. Hepatitis B blood test. Sexually transmitted disease (STD) testing. Diabetes screening. This is done by checking your blood sugar (glucose) after you have not eaten for a while (fasting). You may have this done every 1-3 years. Abdominal aortic aneurysm (AAA) screening. You may need this if you are a current or former smoker. Osteoporosis. You may be screened starting at age 76 if you are at high risk. Talk with your health care provider about your test results, treatment options, and if necessary, the need for more tests. Vaccines  Your health care provider may recommend certain vaccines, such as: Influenza vaccine. This is recommended every year. Tetanus,  diphtheria, and acellular pertussis (Tdap, Td) vaccine. You may need a Td booster every 10 years. Zoster  vaccine. You may need this after age 47. Pneumococcal 13-valent conjugate (PCV13) vaccine. One dose is recommended after age 58. Pneumococcal polysaccharide (PPSV23) vaccine. One dose is recommended after age 18. Talk to your health care provider about which screenings and vaccines you need and how often you need them. This information is not intended to replace advice given to you by your health care provider. Make sure you discuss any questions you have with your health care provider. Document Released: 08/07/2015 Document Revised: 03/30/2016 Document Reviewed: 05/12/2015 Elsevier Interactive Patient Education  2017 [place] Prevention in the Home Falls can cause injuries. They can happen to people of all ages. There are many things you can do to make your home safe and to help prevent falls. What can I do on the outside of my home? Regularly fix the edges of walkways and driveways and fix any cracks. Remove anything that might make you trip as you walk through a door, such as a raised step or threshold. Trim any bushes or trees on the path to your home. Use bright outdoor lighting. Clear any walking paths of anything that might make someone trip, such as rocks or tools. Regularly check to see if handrails are loose or broken. Make sure that both sides of any steps have handrails. Any raised decks and porches should have guardrails on the edges. Have any leaves, snow, or ice cleared regularly. Use sand or salt on walking paths during winter. Clean up any spills in your garage right away. This includes oil or grease spills. What can I do in the bathroom? Use night lights. Install grab bars by the toilet and in the tub and shower. Do not use towel bars as grab bars. Use non-skid mats or decals in the tub or shower. If you need to sit down in the shower, use a plastic, non-slip stool. Keep the floor dry. Clean up any water that spills on the floor as soon as it happens. Remove  soap buildup in the tub or shower regularly. Attach bath mats securely with double-sided non-slip rug tape. Do not have throw rugs and other things on the floor that can make you trip. What can I do in the bedroom? Use night lights. Make sure that you have a light by your bed that is easy to reach. Do not use any sheets or blankets that are too big for your bed. They should not hang down onto the floor. Have a firm chair that has side arms. You can use this for support while you get dressed. Do not have throw rugs and other things on the floor that can make you trip. What can I do in the kitchen? Clean up any spills right away. Avoid walking on wet floors. Keep items that you use a lot in easy-to-reach places. If you need to reach something above you, use a strong step stool that has a grab bar. Keep electrical cords out of the way. Do not use floor polish or wax that makes floors slippery. If you must use wax, use non-skid floor wax. Do not have throw rugs and other things on the floor that can make you trip. What can I do with my stairs? Do not leave any items on the stairs. Make sure that there are handrails on both sides of the stairs and use them. Fix handrails that are broken or loose. Make  sure that handrails are as long as the stairways. Check any carpeting to make sure that it is firmly attached to the stairs. Fix any carpet that is loose or worn. Avoid having throw rugs at the top or bottom of the stairs. If you do have throw rugs, attach them to the floor with carpet tape. Make sure that you have a light switch at the top of the stairs and the bottom of the stairs. If you do not have them, ask someone to add them for you. What else can I do to help prevent falls? Wear shoes that: Do not have high heels. Have rubber bottoms. Are comfortable and fit you well. Are closed at the toe. Do not wear sandals. If you use a stepladder: Make sure that it is fully opened. Do not climb a  closed stepladder. Make sure that both sides of the stepladder are locked into place. Ask someone to hold it for you, if possible. Clearly mark and make sure that you can see: Any grab bars or handrails. First and last steps. Where the edge of each step is. Use tools that help you move around (mobility aids) if they are needed. These include: Canes. Walkers. Scooters. Crutches. Turn on the lights when you go into a dark area. Replace any light bulbs as soon as they burn out. Set up your furniture so you have a clear path. Avoid moving your furniture around. If any of your floors are uneven, fix them. If there are any pets around you, be aware of where they are. Review your medicines with your doctor. Some medicines can make you feel dizzy. This can increase your chance of falling. Ask your doctor what other things that you can do to help prevent falls. This information is not intended to replace advice given to you by your health care provider. Make sure you discuss any questions you have with your health care provider. Document Released: 05/07/2009 Document Revised: 12/17/2015 Document Reviewed: 08/15/2014 Elsevier Interactive Patient Education  2017 [employer].

## 2021-09-01 NOTE — Progress Notes (Addendum)
Virtual Visit via Telephone Note  I connected with  Mark Blankenship on 09/01/21 at  2:30 PM EST by telephone and verified that I am speaking with the correct person using two identifiers.  Medicare Annual Wellness visit completed telephonically due to Covid-19 pandemic.   Persons participating in this call: This Health Coach and this patient.   Location: Patient: Home Provider: Office   I discussed the limitations, risks, security and privacy concerns of performing an evaluation and management service by telephone and the availability of in person appointments. The patient expressed understanding and agreed to proceed.  Unable to perform video visit due to video visit attempted and failed and/or patient does not have video capability.   Some vital signs may be absent or patient reported.   Willette Brace, LPN   Subjective:   Mark Blankenship is a 66 y.o. male who presents for an Initial Medicare Annual Wellness Visit.  Review of Systems     Cardiac Risk Factors include: advanced age (>66men, >53 women);hypertension;diabetes mellitus;dyslipidemia;male gender     Objective:    There were no vitals filed for this visit. There is no height or weight on file to calculate BMI.  Advanced Directives 09/01/2021 11/17/2016 11/08/2016 09/03/2015 06/16/2015 05/31/2013  Does Patient Have a Medical Advance Directive? No No No No No Patient does not have advance directive;Patient would not like information  Would patient like information on creating a medical advance directive? No - Patient declined No - Patient declined No - Patient declined No - patient declined information No - patient declined information -  Pre-existing out of facility DNR order (yellow form or pink MOST form) - - - - - No    Current Medications (verified) Outpatient Encounter Medications as of 09/01/2021  Medication Sig   acetaminophen (TYLENOL) 500 MG tablet Take 1,000 mg by mouth daily as needed for mild pain or  headache.   aspirin EC 81 MG tablet Take 81 mg by mouth daily.   atenolol (TENORMIN) 50 MG tablet TAKE 1 TABLET BY MOUTH EVERY DAY WITH LUNCH   atorvastatin (LIPITOR) 80 MG tablet Take 1 tablet (80 mg total) by mouth every evening.   calcium carbonate (TUMS - DOSED IN MG ELEMENTAL CALCIUM) 500 MG chewable tablet Chew 1 tablet by mouth daily as needed for indigestion or heartburn.   Dulaglutide (TRULICITY) 6.28 BT/5.1VO SOPN Inject 0.75 mg into the skin once a week.   glipiZIDE (GLUCOTROL XL) 10 MG 24 hr tablet Take 1 tablet (10 mg total) by mouth daily with breakfast.   lisinopril (ZESTRIL) 10 MG tablet Take 1 tablet (10 mg total) by mouth daily.   metFORMIN (GLUCOPHAGE) 1000 MG tablet Take 1 tablet (1,000 mg total) by mouth 2 (two) times daily with a meal.   nitroGLYCERIN (NITROSTAT) 0.4 MG SL tablet PLACE 1 TABLET UNDER THE TONGUE EVERY 5 (FIVE) MINUTES AS NEEDED FOR CHEST PAIN (UP TO 3 DOSES).   rivaroxaban (XARELTO) 2.5 MG TABS tablet TAKE 1 TABLET BY MOUTH TWICE DAILY   tamsulosin (FLOMAX) 0.4 MG CAPS capsule TAKE 1 CAPSULE BY MOUTH EVERY DAY AFTER SUPPER.   [DISCONTINUED] HYDROcodone bit-homatropine (HYCODAN) 5-1.5 MG/5ML syrup 1-2 tsp po bid prn cough   No facility-administered encounter medications on file as of 09/01/2021.    Allergies (verified) Patient has no known allergies.   History: Past Medical History:  Diagnosis Date   Arthritis    Ascending aorta dilatation ([place])    a. Ascending aorta mildly dilated by echo 09/03/15.  Bladder cancer [facility]) 2012   Mass removed, then BCG by urologist (Dr. Alinda Money).  Recurrence documented on cysto + bladder washings 10/14/15--getting more maintenance BCG.  Cysto 01/20/16 OK, then more BCG given.  Cysto 04/13/16 c/w just having finished BCG but cytology/FISH ok.  Cysto 07/27/2016 c/w small area of BCG related inflamm, BCG stopped due to severe sx's. 12/2020 cysto benign   Bladder cancer ([place])    Surveillance cysto 11/02/16 cysto showed 1.5 cm raised  erythematous lesion->bx benign. Subsequent cystoscopies unchanged, most recent 07/2019 and 04/2021.   BPH (benign prostatic hyperplasia)    with hx of acute prostatitis   CAD (coronary artery disease)    a. '05 MI > DES to circumflex. b. NSTEMI 08/2015 with total occlusion of the mid LAD along with 20% mid-distal Cx, 100% dCX, 40% D1, 50% pCx, 50% m-dRCA, 80% dRCA.Given >12 hours out from symptom onset, medical therapy recommended.   Diabetes mellitus type 2, controlled ([place])    HbA1c 02/2011 was 6.6%   DVT (deep venous thrombosis) (HCC)    Elevated PSA 09/2011   with prostate nodule.  Prostate bx ALL BENIGN 10/10/11 (Dr. Christella Hartigan followed by Urol--improved to 1.18 Jun 2017.  Nodule NOT palpable anymore on DRE at [facility] 01/2018 office f/u. Stable as of 08/2019. Slight incease 12/2020, urol to rpt 1 yr.   GERD (gastroesophageal reflux disease)    occasional   Headache(784.0)    cluster headaches a long time ago   History of adenomatous polyp of colon 09/23/2020   recall 09/2025   History of bronchitis    History of vertigo    HTN (hypertension)    Hyperlipidemia    Ischemic cardiomyopathy    a. 2D Echo 09/03/15: mild LVH, EF 40-45%, diffuse HK, akinesis of apical myocardium, grade 1 DD, aortic root dimention 27mm, ascending aorta mildly dilated, mildly dilated RV/RA.    Myocardial infarction [facility]) 2005; 08/2015   followed by Dr. Stanford Breed   PAD (peripheral artery disease) ([place]) 06/2019   R LL abnl ABI and doppler->see PSH section of EMR for details.   Thrombocytopenia ([place])    Stable 2012-2017   Tobacco abuse    Wolf-Parkinson-White syndrome    Past Surgical History:  Procedure Laterality Date   BLADDER TUMOR EXCISION  11/08/10; 05/2015   urothelial carcinoma (Dr. Alinda Money).  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   CARDIAC CATHETERIZATION  2005   with stent placement   CARDIAC CATHETERIZATION N/A 09/01/2015   Procedure: Left Heart Cath and Coronary  Angiography;  Surgeon: Belva Crome, MD;  Location: [facility];  Service: Cardiovascular;  Laterality: N/A;   COLONOSCOPY  2010; 09/23/20   2010 Dr. Isa Rankin. 09/2020 Dr. Rose Fillers 09/2025.   CYSTOSCOPY W/ RETROGRADES Bilateral 06/22/2015   Procedure: CYSTOSCOPY WITH RETROGRADE PYELOGRAM;  Surgeon: Raynelle Bring, MD;  Location: [facility];  Service: Urology;  Laterality: Bilateral;   CYSTOSCOPY W/ RETROGRADES N/A 11/17/2016   Procedure: CYSTOSCOPY WITH RETROGRADE PYELOGRAM;  Surgeon: Raynelle Bring, MD;  Location: [facility];  Service: Urology;  Laterality: N/A;   CYSTOSCOPY WITH RETROGRADE PYELOGRAM, URETEROSCOPY AND STENT PLACEMENT N/A 06/06/2013   Procedure: CYSTOSCOPY WITH RETROGRADE PYELOGRAM;  Surgeon: Dutch Gray, MD;  Location: [facility];  Service: Urology;  Laterality: N/A;   LE ABI vascular studies  06/2019   R LL claudication + evidence of PAD (ABI very low AND popliteal and peroneal artery occlusion on dopler u/s) in R leg (L leg fine)->referred to Dr. Fletcher Anon.  Conservative mgmt with walking program and low dose xarelto first, then consider revascularization.   lexiscan  2012   Normal/negative   LUMBAR SPINE [place] in [place] ([place])-no trouble since last surgery   myocardial perfusion study  2009   Neg ischemia, EF 52%   TRANSURETHRAL RESECTION OF BLADDER TUMOR N/A 06/06/2013   Procedure: TRANSURETHRAL RESECTION OF BLADDER TUMOR (TURBT);  Surgeon: Dutch Gray, MD;  Location: [facility];  Service: Urology;  Laterality: N/A;.  PATH: squamous metaplasia, fibrosis with chronic inflammation, no malignancy.   TRANSURETHRAL RESECTION OF BLADDER TUMOR N/A 06/22/2015   Carcinoma in situ: no invasive malignancy.  Procedure: TRANSURETHRAL RESECTION OF BLADDER TUMOR (TURBT);  Surgeon: Raynelle Bring, MD;  Location[facility];  Service: Urology;  Laterality: N/A;  **BLADDER BIOPSY*RESECTION**   TRANSURETHRAL RESECTION OF BLADDER TUMOR N/A 11/17/2016   Path: BENIGN  UROTHELIUM WITH INFLAMMATION--no malignancy. Procedure: TRANSURETHRAL RESECTION OF BLADDER TUMOR (TURBT);  Surgeon: Raynelle Bring, MD;  Location[facility];  Service: Urology;  Laterality: N/A;   VASECTOMY     Family History  Problem Relation Age of Onset   Hypertension Mother    Cancer Father        colon and prostate, mets to lungs.   Hypertension Father    Heart disease Brother        d. 5 MI   Multiple sclerosis Brother    Diabetes Brother    Heart disease Paternal Uncle    Colon cancer Neg Hx    Colon polyps Neg Hx    Esophageal cancer Neg Hx    Stomach cancer Neg Hx    Social History   Socioeconomic History   Marital status: Married    Spouse name: Not on file   Number of children: Not on file   Years of education: Not on file   Highest education level: Some college, no degree  Occupational History   Not on file  Tobacco Use   Smoking status: Former    Packs/day: 0.25    Years: 40.00    Pack years: 10.00    Types: Cigarettes    Quit date: 09/05/2015    Years since quitting: 5.9   Smokeless tobacco: Never  Vaping Use   Vaping Use: Never used  Substance and Sexual Activity   Alcohol use: No    Alcohol/week: 0.0 standard drinks   Drug use: No   Sexual activity: Not on file  Other Topics Concern   Not on file  Social History Narrative   Married, 2 children, 4 grandchildren--[place].   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   Tob: 30 pack-yr hx, quit 08/2011.   No alcohol or drug use.   Active on farm but no formal exercise regimen.   Social Determinants of Health   Financial Resource Strain: Low Risk    Difficulty of Paying Living Expenses: Not hard at all  Food Insecurity: No Food Insecurity   Worried About Charity fundraiser in the Last Year: Never true   [place] in the Last Year: Never true  Transportation Needs: No Transportation  Needs   Lack of Transportation (Medical): No   Lack of Transportation (Non-Medical): No  Physical Activity: Inactive   Days of Exercise per Week: 0 days   Minutes of Exercise per Session: 0 min  Stress: No Stress Concern  Present   Feeling of Stress : Not at all  Social Connections: Socially Integrated   Frequency of Communication with Friends and Family: More than three times a week   Frequency of Social Gatherings with Friends and Family: More than three times a week   Attends Religious Services: More than 4 times per year   Active Member of [employer] or Organizations: Yes   Attends Archivist Meetings: 1 to 4 times per year   Marital Status: Married    Tobacco Counseling Counseling given: Not Answered   Clinical Intake:  Pre-visit preparation completed: Yes  Pain : 0-10 Pain Type: Chronic pain Pain Location: Knee Pain Orientation: Left, Right Pain Descriptors / Indicators: Throbbing, Burning Pain Onset: More than a month ago Pain Frequency: Intermittent     BMI - recorded: 29.45 Nutritional Status: BMI 25 -29 Overweight Nutritional Risks: None Diabetes: Yes CBG done?: Yes (97 per pt) CBG resulted in Enter/ Edit results?: No Did pt. bring in CBG monitor from home?: No  How often do you need to have someone help you when you read instructions, pamphlets, or other written materials from your doctor or pharmacy?: 1 - Never  Diabetic?Nutrition Risk Assessment:  Has the patient had any N/V/D within the last 2 months?  No  Does the patient have any non-healing wounds?  No  Has the patient had any unintentional weight loss or weight gain?  No   Diabetes:  Is the patient diabetic?  Yes  If diabetic, was a CBG obtained today?  Yes  Did the patient bring in their glucometer from home?  No  How often do you monitor your CBG's? Daily.   Financial Strains and Diabetes Management:  Are you having any financial strains with the device, your supplies or your  medication? No .  Does the patient want to be seen by Chronic Care Management for management of their diabetes?  No  Would the patient like to be referred to a Nutritionist or for Diabetic Management?  No    Diabetic Exams:  Diabetic Eye Exam: Completed 03/07/21 Diabetic Foot Exam: Completed 10/02/20   Interpreter Needed?: No  Information entered by :: Charlott Rakes, LPN   Activities of Daily Living In your present state of health, do you have any difficulty performing the following activities: 09/01/2021  Hearing? Y  Comment some loss  Vision? N  Difficulty concentrating or making decisions? N  Walking or climbing stairs? N  Dressing or bathing? N  Doing errands, shopping? N  Preparing Food and eating ? N  Using the Toilet? N  In the past six months, have you accidently leaked urine? N  Do you have problems with loss of bowel control? N  Managing your Medications? N  Managing your Finances? N  Housekeeping or managing your Housekeeping? N  Some recent data might be hidden    Patient Care Team: Tammi Sou, MD as PCP - General (Family Medicine) Stanford Breed, Denice Bors, MD as PCP - Cardiology (Cardiology) Wellington Hampshire, MD as PCP - [facility] Cardiology (Cardiology) Raynelle Bring, MD as Consulting Physician (Urology) Stanford Breed Denice Bors, MD as Consulting Physician (Cardiology) Milus Banister, MD as Consulting Physician (Gastroenterology)  Indicate any recent Medical Services you may have received from other than [facility] providers in the past year (date may be approximate).     Assessment:   This is a routine wellness examination for [place].  Hearing/Vision screen Hearing Screening - Comments:: Pt stated some hearing loss  Vision Screening -  Comments:: Pt follows up with [facility] dr [facility] for annual eye exams    Dietary issues and exercise activities discussed: Current Exercise Habits: The patient does not participate in regular exercise at present   Goals Addressed              This Visit's Progress    Patient Stated       Lose weight        Depression Screen PHQ 2/9 Scores 09/01/2021 05/17/2021 05/01/2020 03/29/2019 03/28/2018  PHQ - 2 Score 0 0 0 0 0  PHQ- 9 Score - - - - 4    Fall Risk Fall Risk  09/01/2021 06/23/2021 06/23/2021 05/17/2021 05/01/2020  Falls in the past year? 0 0 0 0 0  Number falls in past yr: 0 0 - 0 0  Injury with Fall? 0 0 - 0 0  Risk for fall due to : Impaired vision No Fall Risks - - -  Follow up Falls prevention discussed Falls evaluation completed - Falls evaluation completed Falls evaluation completed    FALL RISK PREVENTION PERTAINING TO THE HOME:  Any stairs in or around the home? Yes  If so, are there any without handrails? No  Home free of loose throw rugs in walkways, pet beds, electrical cords, etc? Yes  Adequate lighting in your home to reduce risk of falls? Yes   ASSISTIVE DEVICES UTILIZED TO PREVENT FALLS:  Life alert? No  Use of a cane, walker or w/c? Yes  Grab bars in the bathroom? No  Shower chair or bench in shower? No  Elevated toilet seat or a handicapped toilet? No   TIMED UP AND GO:  Was the test performed? No .  Cognitive Function:     6CIT Screen 09/01/2021  What Year? 0 points  What month? 0 points  What time? 0 points  Count back from 20 0 points  Months in reverse 0 points  Repeat phrase 0 points  Total Score 0    Immunizations Immunization History  Administered Date(s) Administered   Fluad Quad(high Dose 65+) 04/07/2021   Influenza Split 05/07/2012   Influenza,inj,Quad PF,6+ Mos 04/02/2013, 05/09/2014, 05/11/2015, 05/06/2016, 04/06/2017, 03/28/2018, 03/29/2019, 03/25/2020   Moderna Sars-Covid-2 Vaccination 09/27/2019, 10/30/2019, 05/20/2020   PFIZER Comirnaty(Gray Top)Covid-19 Tri-Sucrose Vaccine 02/18/2021   Pfizer Covid-19 Vaccine Bivalent Booster 3yrs & up 05/26/2021   Pneumococcal Conjugate-13 10/02/2020   Pneumococcal Polysaccharide-23 08/20/2014   Tdap 03/12/2011,  05/12/2021   Zoster Recombinat (Shingrix) 05/01/2020, 07/22/2020   Zoster, Live 10/09/2015    TDAP status: Up to date  Flu Vaccine status: Up to date  Pneumococcal vaccine status: Up to date  Covid-19 vaccine status: Completed vaccines  Qualifies for Shingles Vaccine? Yes   Zostavax completed Yes   Shingrix Completed?: Yes  Screening Tests Health Maintenance  Topic Date Due   Pneumonia Vaccine 67+ Years old (3 - PPSV23 if available, else PCV20) 10/02/2021   FOOT EXAM  10/02/2021   HEMOGLOBIN A1C  02/08/2022   OPHTHALMOLOGY EXAM  03/07/2022   COLONOSCOPY (Pts 45-28yrs Insurance coverage will need to be confirmed)  09/24/2023   TETANUS/TDAP  05/13/2031   INFLUENZA VACCINE  Completed   COVID-19 Vaccine  Completed   Hepatitis C Screening  Completed   Zoster Vaccines- Shingrix  Completed   HPV VACCINES  Aged Out    Health Maintenance  Health Maintenance Due  Topic Date Due   Pneumonia Vaccine 36+ Years old (3 - PPSV23 if available, else PCV20) 10/02/2021    Colorectal  cancer screening: Type of screening: Colonoscopy. Completed 09/23/20. Repeat every 3 years  Additional Screening:  Hepatitis C Screening:  Completed 09/06/16  Vision Screening: Recommended annual ophthalmology exams for early detection of glaucoma and other disorders of the eye. Is the patient up to date with their annual eye exam?  Yes  Who is the provider or what is the name of the office in which the patient attends annual eye exams? [facility] Dr If pt is not established with a provider, would they like to be referred to a provider to establish care? No .   Dental Screening: Recommended annual dental exams for proper oral hygiene  Community Resource Referral / Chronic Care Management: CRR required this visit?  No   CCM required this visit?  No      Plan:     I have personally reviewed and noted the following in the patients chart:   Medical and social history Use of alcohol, tobacco or illicit  drugs  Current medications and supplements including opioid prescriptions. Patient is not currently taking opioid prescriptions. Functional ability and status Nutritional status Physical activity Advanced directives List of other physicians Hospitalizations, surgeries, and ER visits in previous 12 months Vitals Screenings to include cognitive, depression, and falls Referrals and appointments  In addition, I have reviewed and discussed with patient certain preventive protocols, quality metrics, and best practice recommendations. A written personalized care plan for preventive services as well as general preventive health recommendations were provided to patient.     Willette Brace, LPN   07/30/1759   Nurse Notes: None

## 2021-09-05 ENCOUNTER — Encounter: Payer: Self-pay | Admitting: Family Medicine

## 2021-09-06 ENCOUNTER — Other Ambulatory Visit: Payer: Self-pay

## 2021-09-06 MED ORDER — TRULICITY 0.75 MG/0.5ML ~~LOC~~ SOAJ: 0.7500 mg | SUBCUTANEOUS | 0 refills | Status: DC

## 2021-09-13 DIAGNOSIS — R310 Gross hematuria: Secondary | ICD-10-CM | POA: Diagnosis not present

## 2021-09-20 DIAGNOSIS — R310 Gross hematuria: Secondary | ICD-10-CM | POA: Diagnosis not present

## 2021-09-20 DIAGNOSIS — R319 Hematuria, unspecified: Secondary | ICD-10-CM | POA: Diagnosis not present

## 2021-10-04 ENCOUNTER — Other Ambulatory Visit: Payer: Self-pay | Admitting: Family Medicine

## 2021-10-05 ENCOUNTER — Encounter: Payer: Self-pay | Admitting: Family Medicine

## 2021-10-21 ENCOUNTER — Other Ambulatory Visit: Payer: Self-pay | Admitting: Family Medicine

## 2021-10-31 ENCOUNTER — Other Ambulatory Visit: Payer: Self-pay | Admitting: Family Medicine

## 2021-11-10 ENCOUNTER — Encounter: Payer: Medicare Other | Admitting: Family Medicine

## 2021-11-12 ENCOUNTER — Ambulatory Visit (INDEPENDENT_AMBULATORY_CARE_PROVIDER_SITE_OTHER): Payer: Medicare Other | Admitting: Family Medicine

## 2021-11-12 ENCOUNTER — Encounter: Payer: Self-pay | Admitting: Family Medicine

## 2021-11-12 VITALS — BP 95/65 | HR 67 | Temp 97.8°F | Ht 66.75 in | Wt 177.2 lb

## 2021-11-12 DIAGNOSIS — Z7901 Long term (current) use of anticoagulants: Secondary | ICD-10-CM

## 2021-11-12 DIAGNOSIS — E119 Type 2 diabetes mellitus without complications: Secondary | ICD-10-CM

## 2021-11-12 DIAGNOSIS — E7800 Pure hypercholesterolemia, unspecified: Secondary | ICD-10-CM | POA: Diagnosis not present

## 2021-11-12 DIAGNOSIS — D696 Thrombocytopenia, unspecified: Secondary | ICD-10-CM

## 2021-11-12 DIAGNOSIS — I10 Essential (primary) hypertension: Secondary | ICD-10-CM

## 2021-11-12 DIAGNOSIS — Z0000 Encounter for general adult medical examination without abnormal findings: Secondary | ICD-10-CM | POA: Diagnosis not present

## 2021-11-12 LAB — HEMOGLOBIN A1C: Hgb A1c MFr Bld: 6.6 % — ABNORMAL HIGH (ref 4.6–6.5)

## 2021-11-12 LAB — LIPID PANEL
Cholesterol: 78 mg/dL (ref 0–200)
HDL: 27.7 mg/dL — ABNORMAL LOW (ref >39.00)
LDL Cholesterol: 36 mg/dL (ref 0–99)
NonHDL: 50.43
Total CHOL/HDL Ratio: 3
Triglycerides: 70 mg/dL (ref 0.0–149.0)
VLDL: 14 mg/dL (ref 0.0–40.0)

## 2021-11-12 LAB — COMPREHENSIVE METABOLIC PANEL
ALT: 17 U/L (ref 0–53)
AST: 16 U/L (ref 0–37)
Albumin: 4.2 g/dL (ref 3.5–5.2)
Alkaline Phosphatase: 67 U/L (ref 39–117)
BUN: 16 mg/dL (ref 6–23)
CO2: 25 mEq/L (ref 19–32)
Calcium: 9.1 mg/dL (ref 8.4–10.5)
Chloride: 105 mEq/L (ref 96–112)
Creatinine, Ser: 1.17 mg/dL (ref 0.40–1.50)
GFR: 64.99 mL/min (ref >60.00)
Glucose, Bld: 147 mg/dL — ABNORMAL HIGH (ref 70–99)
Potassium: 4.3 mEq/L (ref 3.5–5.1)
Sodium: 139 mEq/L (ref 135–145)
Total Bilirubin: 1 mg/dL (ref 0.2–1.2)
Total Protein: 6.3 g/dL (ref 6.0–8.3)

## 2021-11-12 LAB — CBC
HCT: 45.1 % (ref 39.0–52.0)
Hemoglobin: 15.7 g/dL (ref 13.0–17.0)
MCHC: 34.9 g/dL (ref 30.0–36.0)
MCV: 91.4 fl (ref 78.0–100.0)
Platelets: 126 10*3/uL — ABNORMAL LOW (ref 150.0–400.0)
RBC: 4.94 Mil/uL (ref 4.22–5.81)
RDW: 14.2 % (ref 11.5–15.5)
WBC: 6.9 10*3/uL (ref 4.0–10.5)

## 2021-11-12 MED ORDER — NITROGLYCERIN 0.4 MG SL SUBL: SUBLINGUAL_TABLET | SUBLINGUAL | 3 refills | Status: DC

## 2021-11-12 MED ORDER — ATENOLOL 50 MG PO TABS: ORAL_TABLET | ORAL | 1 refills | Status: DC

## 2021-11-12 NOTE — Patient Instructions (Signed)
Health Maintenance, Male Adopting a healthy lifestyle and getting preventive care are important in promoting health and wellness. Ask your health care provider about: The right schedule for you to have regular tests and exams. Things you can do on your own to prevent diseases and keep yourself healthy. What should I know about diet, weight, and exercise? Eat a healthy diet  Eat a diet that includes plenty of vegetables, fruits, low-fat dairy products, and lean protein. Do not eat a lot of foods that are high in solid fats, added sugars, or sodium. Maintain a healthy weight Body mass index (BMI) is a measurement that can be used to identify possible weight problems. It estimates body fat based on height and weight. Your health care provider can help determine your BMI and help you achieve or maintain a healthy weight. Get regular exercise Get regular exercise. This is one of the most important things you can do for your health. Most adults should: Exercise for at least 150 minutes each week. The exercise should increase your heart rate and make you sweat (moderate-intensity exercise). Do strengthening exercises at least twice a week. This is in addition to the moderate-intensity exercise. Spend less time sitting. Even light physical activity can be beneficial. Watch cholesterol and blood lipids Have your blood tested for lipids and cholesterol at 67 years of age, then have this test every 5 years. You may need to have your cholesterol levels checked more often if: Your lipid or cholesterol levels are high. You are older than 67 years of age. You are at high risk for heart disease. What should I know about cancer screening? Many types of cancers can be detected early and may often be prevented. Depending on your health history and family history, you may need to have cancer screening at various ages. This may include screening for: Colorectal cancer. Prostate cancer. Skin cancer. Lung  cancer. What should I know about heart disease, diabetes, and high blood pressure? Blood pressure and heart disease High blood pressure causes heart disease and increases the risk of stroke. This is more likely to develop in people who have high blood pressure readings or are overweight. Talk with your health care provider about your target blood pressure readings. Have your blood pressure checked: Every 3-5 years if you are 18-39 years of age. Every year if you are 40 years old or older. If you are between the ages of 65 and 75 and are a current or former smoker, ask your health care provider if you should have a one-time screening for abdominal aortic aneurysm (AAA). Diabetes Have regular diabetes screenings. This checks your fasting blood sugar level. Have the screening done: Once every three years after age 45 if you are at a normal weight and have a low risk for diabetes. More often and at a younger age if you are overweight or have a high risk for diabetes. What should I know about preventing infection? Hepatitis B If you have a higher risk for hepatitis B, you should be screened for this virus. Talk with your health care provider to find out if you are at risk for hepatitis B infection. Hepatitis C Blood testing is recommended for: Everyone born from 1945 through 1965. Anyone with known risk factors for hepatitis C. Sexually transmitted infections (STIs) You should be screened each year for STIs, including gonorrhea and chlamydia, if: You are sexually active and are younger than 67 years of age. You are older than 67 years of age and your   health care provider tells you that you are at risk for this type of infection. Your sexual activity has changed since you were last screened, and you are at increased risk for chlamydia or gonorrhea. Ask your health care provider if you are at risk. Ask your health care provider about whether you are at high risk for HIV. Your health care provider  may recommend a prescription medicine to help prevent HIV infection. If you choose to take medicine to prevent HIV, you should first get tested for HIV. You should then be tested every 3 months for as long as you are taking the medicine. Follow these instructions at home: Alcohol use Do not drink alcohol if your health care provider tells you not to drink. If you drink alcohol: Limit how much you have to 0-2 drinks a day. Know how much alcohol is in your drink. In the [place] one drink equals one 12 oz bottle of beer (355 mL), one 5 oz glass of wine (148 mL), or one 1 oz glass of hard liquor (44 mL). Lifestyle Do not use any products that contain nicotine or tobacco. These products include cigarettes, chewing tobacco, and vaping devices, such as e-cigarettes. If you need help quitting, ask your health care provider. Do not use street drugs. Do not share needles. Ask your health care provider for help if you need support or information about quitting drugs. General instructions Schedule regular health, dental, and eye exams. Stay current with your vaccines. Tell your health care provider if: You often feel depressed. You have ever been abused or do not feel safe at home. Summary Adopting a healthy lifestyle and getting preventive care are important in promoting health and wellness. Follow your health care provider's instructions about healthy diet, exercising, and getting tested or screened for diseases. Follow your health care provider's instructions on monitoring your cholesterol and blood pressure. This information is not intended to replace advice given to you by your health care provider. Make sure you discuss any questions you have with your health care provider. Document Revised: 11/30/2020 Document Reviewed: 11/30/2020 Elsevier Patient Education  2023 [employer]  

## 2021-11-12 NOTE — Progress Notes (Signed)
Office Note ?11/12/2021 ? ?CC:  ?Chief Complaint  ?Patient presents with  ? Annual Exam  ?  Pt is fasting  ? ? ?HPI:  ?Patient is a 66 y.o. male who is here for annual health maintenance exam and 3 mo f/u DM, HTN, HLD, and PAD (for which he is treated with low dose xarelto per vasc MD). ?A/P as of last visit: ?"#1 diabetes type 2 without complication. ?Control not as good as his usual. ?If A1c still up over 7% today then we will add another agent.  Insurance will not cover Ozempic. ?When we call him back about results will give him the names of some other GLP 1 agonists as well as some SGL 2 inhibitors and insulins to check with insurer about. ?Bmet in A1c today. ?  ?#2 hypertension.  Well-controlled.  Continue atenolol 50 mg a day and lisinopril 10 mg a day. ?Electrolytes and creatinine today.  Next ? ?3.  Hyperlipidemia.  Tolerating atorvastatin 80 mg a day. ?LDL 52 three mo/ago. ?Plan repeat lipid panel and hepatic panel in 3 months. ? ?4.  Coronary artery disease and peripheral artery disease. ?He continues aspirin, statin, and beta-blocker. ?Additionally, his vascular doctor has him on low-dose Eliquis.  No signs of bleeding. ?  ?#5 right ear cerumen impaction.  This is too deep in the canal for me to try to reach with a curette.   ?Patient elected for irrigation to attempt disimpaction today. ?Consent obtained. ?Procedure: Cerumen Disimpaction  ?Warm water was applied and gentle ear lavage performed on right.  Pt tolerated procedure well. There were no complications and the cerumen was completely evacuated. ?Tympanic membrane is intact following the procedure.  Auditory canals are normal.  The patient reported relief of symptoms after removal of cerumen  ?" ? ?INTERIM HX: ?[place] says he is feeling well. ?Getting ready for the spring farmers market season. ?No home glucose monitoring.  Tolerating Trulicity without problem. ?No home blood pressure monitoring. ? ?Says he has started having some intermittent  burning/tingling/numbness sensation in the tips of both feet over the last few months. ? ?Past Medical History:  ?Diagnosis Date  ? Arthritis   ? Ascending aorta dilatation (HCC)   ? a. Ascending aorta mildly dilated by echo 09/03/15.  ? Bladder cancer ([place]) 2012  ? Mass removed, then BCG by urologist (Dr. Alinda Money).  Recurrence documented on cysto + bladder washings 10/14/15--getting more maintenance BCG.  Cysto 01/20/16 OK, then more BCG given.  Cysto 04/13/16 c/w just having finished BCG but cytology/FISH ok.  Cysto 07/27/2016 c/w small area of BCG related inflamm, BCG stopped due to severe sx's. 12/2020 cysto benign  ? Bladder cancer ([place])   ? Surveillance cysto 11/02/16 cysto showed 1.5 cm raised erythematous lesion->bx benign. Subsequent cystoscopies unchanged, most recent 07/2019 and 04/2021.  ? BPH (benign prostatic hyperplasia)   ? with hx of acute prostatitis  ? CAD (coronary artery disease)   ? a. '05 MI > DES to circumflex. b. NSTEMI 08/2015 with total occlusion of the mid LAD along with 20% mid-distal Cx, 100% dCX, 40% D1, 50% pCx, 50% m-dRCA, 80% dRCA.Given >12 hours out from symptom onset, medical therapy recommended.  ? Diabetes mellitus type 2, controlled ([place])   ? HbA1c 02/2011 was 6.6%  ? DVT (deep venous thrombosis) ([place])   ? Elevated PSA 09/2011  ? with prostate nodule.  Prostate bx ALL BENIGN 10/10/11 (Dr. Christella Hartigan followed by Urol--improved to 1.18 Jun 2017.  Nodule NOT palpable anymore on DRE at  urol 01/2018 office f/u. Stable as of 08/2019. Slight incease 12/2020, urol to rpt 1 yr.  ? GERD (gastroesophageal reflux disease)   ? occasional  ? Headache(784.0)   ? cluster headaches a long time ago  ? History of adenomatous polyp of colon 09/23/2020  ? recall 09/2025  ? History of bronchitis   ? History of vertigo   ? HTN (hypertension)   ? Hyperlipidemia   ? Ischemic cardiomyopathy   ? a. 2D Echo 09/03/15: mild LVH, EF 40-45%, diffuse HK, akinesis of apical myocardium, grade 1 DD, aortic root dimention 106m,  ascending aorta mildly dilated, mildly dilated RV/RA.   ? Myocardial infarction ([facility] 2005; 08/2015  ? followed by Dr. CStanford Breed ? PAD (peripheral artery disease) ([facility] 06/2019  ? R LL abnl ABI and doppler->see PSH section of EMR for details.  ? Thrombocytopenia ([place]   ? Stable 2012-2017  ? Tobacco abuse   ? Wolf-Parkinson-White syndrome   ? ? ?Past Surgical History:  ?Procedure Laterality Date  ? BLADDER TUMOR EXCISION  11/08/10; 05/2015  ? urothelial carcinoma (Dr. BAlinda Money.  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  ? CARDIAC CATHETERIZATION  2005  ? with stent placement  ? CARDIAC CATHETERIZATION N/A 09/01/2015  ? Procedure: Left Heart Cath and Coronary Angiography;  Surgeon: HBelva Crome MD;  [facility];  Service: Cardiovascular;  Laterality: N/A;  ? COLONOSCOPY  2010; 09/23/20  ? 2010 Dr. MIsa Rankin 09/2020 Dr. JRose Fillers3/2027.  ? CYSTOSCOPY W/ RETROGRADES Bilateral 06/22/2015  ? Procedure: CYSTOSCOPY WITH RETROGRADE PYELOGRAM;  Surgeon: LRaynelle Bring MD;  [facility];  Service: Urology;  Laterality: Bilateral;  ? CYSTOSCOPY W/ RETROGRADES N/A 11/17/2016  ? Procedure: CYSTOSCOPY WITH RETROGRADE PYELOGRAM;  Surgeon: LRaynelle Bring MD;  [facility];  Service: Urology;  Laterality: N/A;  ? CYSTOSCOPY WITH RETROGRADE PYELOGRAM, URETEROSCOPY AND STENT PLACEMENT N/A 06/06/2013  ? Procedure: CYSTOSCOPY WITH RETROGRADE PYELOGRAM;  Surgeon: LDutch Gray MD;  [facility];  Service: Urology;  Laterality: N/A;  ? LE ABI vascular studies  06/2019  ? R LL claudication + evidence of PAD (ABI very low AND popliteal and peroneal artery occlusion on dopler u/s) in R leg (L leg fine)->referred to Dr. AFletcher Anon  Conservative mgmt with walking program and low dose xarelto first, then consider revascularization.  ? lexiscan  2012  ? Normal/negative  ? [place] ? [place]no trouble since last surgery  ?  myocardial perfusion study  2009  ? Neg ischemia, EF 52%  ? TRANSURETHRAL RESECTION OF BLADDER TUMOR N/A 06/06/2013  ? Procedure: TRANSURETHRAL RESECTION OF BLADDER TUMOR (TURBT);  Surgeon: LDutch Gray MD;  [facility];  Service: Urology;  Laterality: N/A;.  PATH: squamous metaplasia, fibrosis with chronic inflammation, no malignancy.  ? TRANSURETHRAL RESECTION OF BLADDER TUMOR N/A 06/22/2015  ? Carcinoma in situ: no invasive malignancy.  Procedure: TRANSURETHRAL RESECTION OF BLADDER TUMOR (TURBT);  Surgeon: LRaynelle Bring MD;  [facility];  Service: Urology;  Laterality: N/A;  **BLADDER BIOPSY*RESECTION**  ? TRANSURETHRAL RESECTION OF BLADDER TUMOR N/A 11/17/2016  ? Path: BENIGN UROTHELIUM WITH INFLAMMATION--no malignancy. Procedure: TRANSURETHRAL RESECTION OF BLADDER TUMOR (TURBT);  Surgeon: LRaynelle Bring MD;  [facility];  Service: Urology;  Laterality: N/A;  ? VASECTOMY    ? ? ?Family History  ?Problem Relation Age of Onset  ? Hypertension Mother   ? Cancer Father   ?  colon and prostate, mets to lungs.  ? Hypertension Father   ? Heart disease Brother   ?     d. 74 MI  ? Multiple sclerosis Brother   ? Diabetes Brother   ? Heart disease Paternal Uncle   ? Colon cancer Neg Hx   ? Colon polyps Neg Hx   ? Esophageal cancer Neg Hx   ? Stomach cancer Neg Hx   ? ? ?Social History  ? ?Socioeconomic History  ? Marital status: Married  ?  Spouse name: Not on file  ? Number of children: Not on file  ? Years of education: Not on file  ? Highest education level: Some college, no degree  ?Occupational History  ? Not on file  ?Tobacco Use  ? Smoking status: Former  ?  Packs/day: 0.25  ?  Years: 40.00  ?  Pack years: 10.00  ?  Types: Cigarettes  ?  Quit date: 09/05/2015  ?  Years since quitting: 6.1  ? Smokeless tobacco: Never  ?Vaping Use  ? Vaping Use: Never used  ?Substance and Sexual Activity  ? Alcohol use: No  ?  Alcohol/week: 0.0 standard drinks  ? Drug use: No  ? Sexual activity: Not on file  ?Other  Topics Concern  ? Not on file  ?Social History Narrative  ? Married, 2 children, 4 grandchildren--[place].  ? 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

## 2021-11-12 NOTE — Addendum Note (Signed)
Addended by: Deveron Furlong D on: 11/12/2021 08:46 AM ? ? Modules accepted: Orders ? ?

## 2021-11-17 DIAGNOSIS — R3914 Feeling of incomplete bladder emptying: Secondary | ICD-10-CM | POA: Diagnosis not present

## 2021-11-17 DIAGNOSIS — Z8551 Personal history of malignant neoplasm of bladder: Secondary | ICD-10-CM | POA: Diagnosis not present

## 2021-11-17 DIAGNOSIS — R972 Elevated prostate specific antigen [PSA]: Secondary | ICD-10-CM | POA: Diagnosis not present

## 2021-11-17 DIAGNOSIS — N401 Enlarged prostate with lower urinary tract symptoms: Secondary | ICD-10-CM | POA: Diagnosis not present

## 2021-11-17 DIAGNOSIS — R8271 Bacteriuria: Secondary | ICD-10-CM | POA: Diagnosis not present

## 2021-11-28 ENCOUNTER — Other Ambulatory Visit: Payer: Self-pay | Admitting: Family Medicine

## 2021-11-29 ENCOUNTER — Other Ambulatory Visit: Payer: Self-pay

## 2021-11-29 ENCOUNTER — Encounter: Payer: Self-pay | Admitting: Family Medicine

## 2021-11-29 MED ORDER — TRULICITY 0.75 MG/0.5ML ~~LOC~~ SOAJ: SUBCUTANEOUS | 1 refills | Status: DC

## 2021-12-13 ENCOUNTER — Encounter: Payer: Self-pay | Admitting: Cardiology

## 2021-12-13 ENCOUNTER — Encounter: Payer: Self-pay | Admitting: Family Medicine

## 2021-12-13 NOTE — Telephone Encounter (Signed)
Please advise 

## 2021-12-16 ENCOUNTER — Other Ambulatory Visit: Payer: Self-pay

## 2021-12-16 MED ORDER — ATORVASTATIN CALCIUM 80 MG PO TABS: 80.0000 mg | ORAL_TABLET | Freq: Every evening | ORAL | 3 refills | Status: DC

## 2021-12-17 DIAGNOSIS — R972 Elevated prostate specific antigen [PSA]: Secondary | ICD-10-CM | POA: Diagnosis not present

## 2021-12-17 LAB — PSA: PSA: 1.02

## 2021-12-24 DIAGNOSIS — N401 Enlarged prostate with lower urinary tract symptoms: Secondary | ICD-10-CM | POA: Diagnosis not present

## 2021-12-24 DIAGNOSIS — Z8551 Personal history of malignant neoplasm of bladder: Secondary | ICD-10-CM | POA: Diagnosis not present

## 2021-12-24 DIAGNOSIS — R3914 Feeling of incomplete bladder emptying: Secondary | ICD-10-CM | POA: Diagnosis not present

## 2022-01-20 ENCOUNTER — Other Ambulatory Visit: Payer: Self-pay | Admitting: *Deleted

## 2022-01-20 DIAGNOSIS — I2510 Atherosclerotic heart disease of native coronary artery without angina pectoris: Secondary | ICD-10-CM

## 2022-01-20 MED ORDER — XARELTO 2.5 MG PO TABS: 2.5000 mg | ORAL_TABLET | Freq: Two times a day (BID) | ORAL | 1 refills | Status: DC

## 2022-01-20 NOTE — Telephone Encounter (Signed)
Xarelto 2.'5mg'$  refill request received. Pt is 66 years old, weight-80.4kg, Crea-1.17 on 11/12/2021, last seen by Dr. Stanford Breed on 08/03/2021, Diagnosis-DVT, CrCl-70.12m/min; Dose is appropriate based on Dr. ATyrell Antonionote OV note it states "OV note by Dr. AFletcher Anon"Continue low-dose Xarelto twice daily"  "If cost becomes an issue, we could consider switching him to clopidogrel",  Will send in refill to requested pharmacy.

## 2022-01-24 ENCOUNTER — Encounter: Payer: Self-pay | Admitting: Family Medicine

## 2022-01-26 ENCOUNTER — Other Ambulatory Visit: Payer: Self-pay

## 2022-01-26 MED ORDER — TRULICITY 0.75 MG/0.5ML ~~LOC~~ SOAJ: SUBCUTANEOUS | 0 refills | Status: DC

## 2022-01-31 ENCOUNTER — Ambulatory Visit (INDEPENDENT_AMBULATORY_CARE_PROVIDER_SITE_OTHER): Payer: Medicare Other | Admitting: Family Medicine

## 2022-01-31 ENCOUNTER — Encounter: Payer: Self-pay | Admitting: Family Medicine

## 2022-01-31 VITALS — BP 118/76 | HR 63 | Temp 97.9°F | Ht 66.75 in | Wt 173.4 lb

## 2022-01-31 DIAGNOSIS — I10 Essential (primary) hypertension: Secondary | ICD-10-CM | POA: Diagnosis not present

## 2022-01-31 DIAGNOSIS — E1140 Type 2 diabetes mellitus with diabetic neuropathy, unspecified: Secondary | ICD-10-CM

## 2022-01-31 DIAGNOSIS — E7800 Pure hypercholesterolemia, unspecified: Secondary | ICD-10-CM | POA: Diagnosis not present

## 2022-01-31 LAB — BASIC METABOLIC PANEL
BUN: 14 mg/dL (ref 6–23)
CO2: 27 mEq/L (ref 19–32)
Calcium: 9.3 mg/dL (ref 8.4–10.5)
Chloride: 107 mEq/L (ref 96–112)
Creatinine, Ser: 1.09 mg/dL (ref 0.40–1.50)
GFR: 70.65 mL/min (ref >60.00)
Glucose, Bld: 87 mg/dL (ref 70–99)
Potassium: 4.5 mEq/L (ref 3.5–5.1)
Sodium: 141 mEq/L (ref 135–145)

## 2022-01-31 LAB — POCT GLYCOSYLATED HEMOGLOBIN (HGB A1C)
HbA1c POC (<> result, manual entry): 5.8 % (ref 4.0–5.6)
HbA1c, POC (controlled diabetic range): 5.8 % (ref 0.0–7.0)
HbA1c, POC (prediabetic range): 5.8 % (ref 5.7–6.4)
Hemoglobin A1C: 5.9 % — AB (ref 4.0–5.6)

## 2022-01-31 NOTE — Progress Notes (Signed)
OFFICE VISIT  01/31/2022  CC:  Chief Complaint  Patient presents with   Diabetes   Hypertension    Pt is fasting   Patient is a 66 y.o. male who presents for 3 mo f/u DM, HTN, HLD, and PAD (for which he is treated with low dose xarelto per vasc MD). A/P as of last visit: "#1 diabetes type 2, not ideal control. He has some mild symptoms of peripheral neuropathy now. Tolerated recent start of Trulicity. If hemoglobin A1c is still greater than 7.0% then will increase Trulicity to 1.5 mg subcu weekly. Continue metformin and glipizide at full dosing. Feet exam normal today.  Urine microalbumin/creatinine today.  2.  Hypertension.  BP on the low side here today, asymptomatic. He will recheck blood pressure before he takes his meds around lunchtime today and will hold blood pressure meds if less than 120/70. Electrolytes and creatinine today.   #3 hyperlipidemia, doing well on atorvastatin 80 mg a day. Lipid panel and hepatic panel today.  #4 Coronary artery disease and peripheral artery disease. He continues aspirin, statin, and beta-blocker. Additionally, his vascular doctor has him on low-dose Eliquis.  No signs of bleeding. CBC today.   #5 Health maintenance exam: Reviewed age and gender appropriate health maintenance issues (prudent diet, regular exercise, health risks of tobacco and excessive alcohol, use of seatbelts, fire alarms in home, use of sunscreen).  Also reviewed age and gender appropriate health screening as well as vaccine recommendations. Vaccines: ALL UTD. Labs: fasting HP, Hba1c, urine microalb/cr. Prostate ca screening: followed by urol for elev PSA. Colon ca screening: UTD, recall 2027. Lung cancer screening: Has 30-pack-year history tobacco smoking, quit approximately 10 years ago-->he defers for now, wants to discuss again at next visit."  INTERIM HX:  He is feeling well. Checks glucose about once a week and said it is typically 100 fasting. Diet is  healthy.  No home blood pressure monitoring.  ROS as above, plus--> no fevers, no CP, no SOB, no wheezing, no cough, no dizziness, no HAs, no rashes, no melena/hematochezia.  No polyuria or polydipsia.  No myalgias or arthralgias.  No focal weakness, paresthesias, or tremors.  No acute vision or hearing abnormalities.  No dysuria or unusual/new urinary urgency or frequency.  No recent changes in lower legs. No n/v/d or abd pain.  No palpitations.     Past Medical History:  Diagnosis Date   Arthritis    Ascending aorta dilatation ([place])    a. Ascending aorta mildly dilated by echo 09/03/15.   Bladder cancer [facility]) 2012   Mass removed, then BCG by urologist (Dr. Alinda Money).  Recurrence documented on cysto + bladder washings 10/14/15--getting more maintenance BCG.  Cysto 01/20/16 OK, then more BCG given.  Cysto 04/13/16 c/w just having finished BCG but cytology/FISH ok.  Cysto 07/27/2016 c/w small area of BCG related inflamm, BCG stopped due to severe sx's. 12/2020 cysto benign   Bladder cancer ([place])    Surveillance cysto 11/02/16 cysto showed 1.5 cm raised erythematous lesion->bx benign. Subsequent cystoscopies unchanged, most recent 07/2019 and 04/2021.   BPH (benign prostatic hyperplasia)    with hx of acute prostatitis   CAD (coronary artery disease)    a. '05 MI > DES to circumflex. b. NSTEMI 08/2015 with total occlusion of the mid LAD along with 20% mid-distal Cx, 100% dCX, 40% D1, 50% pCx, 50% m-dRCA, 80% dRCA.Given >12 hours out from symptom onset, medical therapy recommended.   Diabetes mellitus type 2, controlled ([place])  HbA1c 02/2011 was 6.6%   DVT (deep venous thrombosis) (HCC)    Elevated PSA 09/2011   with prostate nodule.  Prostate bx ALL BENIGN 10/10/11 (Dr. Christella Hartigan followed by Urol--improved to 1.18 Jun 2017.  Nodule NOT palpable anymore on DRE at [facility] 01/2018 office f/u. Stable as of 08/2019. Slight incease 12/2020, urol to rpt 1 yr.   GERD (gastroesophageal reflux disease)    occasional    Headache(784.0)    cluster headaches a long time ago   History of adenomatous polyp of colon 09/23/2020   recall 09/2025   History of bronchitis    History of vertigo    HTN (hypertension)    Hyperlipidemia    Ischemic cardiomyopathy    a. 2D Echo 09/03/15: mild LVH, EF 40-45%, diffuse HK, akinesis of apical myocardium, grade 1 DD, aortic root dimention 19m, ascending aorta mildly dilated, mildly dilated RV/RA.    Myocardial infarction ([facility] 2005; 08/2015   followed by Dr. CStanford Breed  PAD (peripheral artery disease) ([place] 06/2019   R LL abnl ABI and doppler->see PSH section of EMR for details.   [place]rombocytopenia ([facility] [place]    Stable 2012-2017   Tobacco abuse    Wolf-Parkinson-White syndrome     Past Surgical History:  Procedure Laterality Date   BLADDER TUMOR EXCISION  11/08/10; 05/2015   urothelial carcinoma (Dr. BAlinda Money.  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   CARDIAC CATHETERIZATION  2005   with stent placement   CARDIAC CATHETERIZATION N/A 09/01/2015   Procedure: Left Heart Cath and Coronary Angiography;  Surgeon: HBelva Crome MD;  [facility];  Service: Cardiovascular;  Laterality: N/A;   COLONOSCOPY  2010; 09/23/20   2010 Dr. MIsa Rankin 09/2020 Dr. JRose Fillers3/2027.   CYSTOSCOPY W/ RETROGRADES Bilateral 06/22/2015   Procedure: CYSTOSCOPY WITH RETROGRADE PYELOGRAM;  Surgeon: LRaynelle Bring MD;  [facility];  Service: Urology;  Laterality: Bilateral;   CYSTOSCOPY W/ RETROGRADES N/A 11/17/2016   Procedure: CYSTOSCOPY WITH RETROGRADE PYELOGRAM;  Surgeon: LRaynelle Bring MD;  [facility];  Service: Urology;  Laterality: N/A;   CYSTOSCOPY WITH RETROGRADE PYELOGRAM, URETEROSCOPY AND STENT PLACEMENT N/A 06/06/2013   Procedure: CYSTOSCOPY WITH RETROGRADE PYELOGRAM;  Surgeon: LDutch Gray MD;  [facility];  Service: Urology;  Laterality: N/A;   LE ABI vascular studies  06/2019   R LL claudication +  evidence of PAD (ABI very low AND popliteal and peroneal artery occlusion on dopler u/s) in R leg (L leg fine)->referred to Dr. AFletcher Anon  Conservative mgmt with walking program and low dose xarelto first, then consider revascularization.   lexiscan  2012   Normal/negative   LUMBAR SPINE [place])-no trouble since last surgery   myocardial perfusion study  2009   Neg ischemia, EF 52%   TRANSURETHRAL RESECTION OF BLADDER TUMOR N/A 06/06/2013   Procedure: TRANSURETHRAL RESECTION OF BLADDER TUMOR (TURBT);  Surgeon: LDutch Gray MD;  [facility];  Service: Urology;  Laterality: N/A;.  PATH: squamous metaplasia, fibrosis with chronic inflammation, no malignancy.   TRANSURETHRAL RESECTION OF BLADDER TUMOR N/A 06/22/2015   Carcinoma in situ: no invasive malignancy.  Procedure: TRANSURETHRAL RESECTION OF BLADDER TUMOR (TURBT);  Surgeon: LRaynelle Bring MD;  [facility];  Service: Urology;  Laterality: N/A;  **BLADDER BIOPSY*RESECTION**   TRANSURETHRAL RESECTION OF BLADDER TUMOR N/A 11/17/2016   Path: BENIGN UROTHELIUM WITH INFLAMMATION--no malignancy. Procedure: TRANSURETHRAL RESECTION OF BLADDER TUMOR (TURBT);  Surgeon:  Raynelle Bring, MD;  [facility];  Service: Urology;  Laterality: N/A;   VASECTOMY      Outpatient Medications Prior to Visit  Medication Sig Dispense Refill   acetaminophen (TYLENOL) 500 MG tablet Take 1,000 mg by mouth daily as needed for mild pain or headache.     aspirin EC 81 MG tablet Take 81 mg by mouth daily.     atenolol (TENORMIN) 50 MG tablet TAKE 1 TABLET BY MOUTH EVERY DAY WITH LUNCH 90 tablet 1   atorvastatin (LIPITOR) 80 MG tablet Take 1 tablet (80 mg total) by mouth every evening. 90 tablet 3   calcium carbonate (TUMS - DOSED IN MG ELEMENTAL CALCIUM) 500 MG chewable tablet Chew 1 tablet by mouth daily as needed for indigestion or heartburn.     Dulaglutide (TRULICITY) 3.08 MV/7.8IO SOPN ADMINISTER 0.75 MG UNDER THE  SKIN 1 TIME A WEEK 2 mL 0   glipiZIDE (GLUCOTROL XL) 10 MG 24 hr tablet Take 1 tablet (10 mg total) by mouth daily with breakfast. 90 tablet 3   lisinopril (ZESTRIL) 10 MG tablet Take 1 tablet (10 mg total) by mouth daily. 90 tablet 3   loratadine (CLARITIN) 10 MG tablet Take 10 mg by mouth as needed for allergies.     metFORMIN (GLUCOPHAGE) 1000 MG tablet Take 1 tablet (1,000 mg total) by mouth 2 (two) times daily with a meal. 180 tablet 3   rivaroxaban (XARELTO) 2.5 MG TABS tablet Take 1 tablet (2.5 mg total) by mouth 2 (two) times daily. 180 tablet 1   tamsulosin (FLOMAX) 0.4 MG CAPS capsule TAKE 1 CAPSULE BY MOUTH EVERY DAY AFTER SUPPER. 90 capsule 3   nitroGLYCERIN (NITROSTAT) 0.4 MG SL tablet PLACE 1 TABLET UNDER THE TONGUE EVERY 5 (FIVE) MINUTES AS NEEDED FOR CHEST PAIN (UP TO 3 DOSES). (Patient not taking: Reported on 01/31/2022) 25 tablet 3   No facility-administered medications prior to visit.    No Known Allergies  ROS As per HPI  PE:    01/31/2022    8:23 AM 11/12/2021    8:07 AM 08/11/2021    8:21 AM  Vitals with BMI  Height 5' 6.75" 5' 6.75" '5\' 6"'$   Weight 173 lbs 6 oz 177 lbs 3 oz 182 lbs 6 oz  BMI 27.38 96.29 52.84  Systolic 132 95 440  Diastolic 76 65 70  Pulse 63 67 59   Physical Exam  Gen: Alert, well appearing.  Patient is oriented to person, place, time, and situation. AFFECT: pleasant, lucid thought and speech. CV: RRR, no m/r/g.   LUNGS: CTA bilat, nonlabored resps, good aeration in all lung fields. EXT: no clubbing or cyanosis.  no edema.    LABS:  Last CBC Lab Results  Component Value Date   WBC 6.9 11/12/2021   HGB 15.7 11/12/2021   HCT 45.1 11/12/2021   MCV 91.4 11/12/2021   MCH 30.6 11/08/2016   RDW 14.2 11/12/2021   PLT 126.0 (L) 05/21/2535   Last metabolic panel Lab Results  Component Value Date   GLUCOSE 147 (H) 11/12/2021   NA 139 11/12/2021   K 4.3 11/12/2021   CL 105 11/12/2021   CO2 25 11/12/2021   BUN 16 11/12/2021    CREATININE 1.17 11/12/2021   GFRNONAA >60 11/08/2016   CALCIUM 9.1 11/12/2021   PROT 6.3 11/12/2021   ALBUMIN 4.2 11/12/2021   BILITOT 1.0 11/12/2021   ALKPHOS 67 11/12/2021   AST 16 11/12/2021   ALT 17 11/12/2021   ANIONGAP  9 11/08/2016   Last lipids Lab Results  Component Value Date   CHOL 78 11/12/2021   HDL 27.70 (L) 11/12/2021   LDLCALC 36 11/12/2021   LDLDIRECT 87.2 09/03/2010   TRIG 70.0 11/12/2021   CHOLHDL 3 11/12/2021   Last hemoglobin A1c Lab Results  Component Value Date   HGBA1C 5.9 (A) 01/31/2022   HGBA1C 5.8 01/31/2022   HGBA1C 5.8 01/31/2022   HGBA1C 5.8 01/31/2022   Last thyroid functions Lab Results  Component Value Date   TSH 0.96 08/26/2015   IMPRESSION AND PLAN:  #1 type 2 diabetes, doing well on Trulicity 7.07 mg weekly, metformin 1000 mg twice a day, and glipizide XL 10 mg a day POC Hba1c today 5.9%.  #2 hypertension, well controlled on lisinopril 10 mg a day and atenolol 50 mg a day. Electrolytes and creatinine today.  #3 hyperlipidemia, LDL was 36 about 3 months ago. Continue atorvastatin 80 mg a day. Repeat lipids 3 months  An After Visit Summary was printed and given to the patient.  FOLLOW UP: Return in about 3 months (around 05/03/2022) for routine chronic illness f/u. Next cpe 10/2022  Signed:  Crissie Sickles, MD           01/31/2022

## 2022-02-07 ENCOUNTER — Other Ambulatory Visit: Payer: Self-pay

## 2022-02-13 ENCOUNTER — Encounter: Payer: Self-pay | Admitting: Family Medicine

## 2022-02-14 ENCOUNTER — Other Ambulatory Visit: Payer: Self-pay

## 2022-02-14 MED ORDER — GLIPIZIDE ER 10 MG PO TB24: 10.0000 mg | ORAL_TABLET | Freq: Every day | ORAL | 0 refills | Status: DC

## 2022-02-14 MED ORDER — TAMSULOSIN HCL 0.4 MG PO CAPS: ORAL_CAPSULE | ORAL | 0 refills | Status: DC

## 2022-02-14 NOTE — Telephone Encounter (Signed)
Patient calling to speak to Mark Blankenship.  She has been working with patient with one of his prescriptions.  Please call [phone]

## 2022-02-14 NOTE — Telephone Encounter (Signed)
Spoke with pharmacy. Rx sent

## 2022-02-15 ENCOUNTER — Other Ambulatory Visit: Payer: Self-pay

## 2022-02-25 ENCOUNTER — Other Ambulatory Visit: Payer: Self-pay

## 2022-02-25 ENCOUNTER — Other Ambulatory Visit: Payer: Self-pay | Admitting: Family Medicine

## 2022-02-25 ENCOUNTER — Encounter: Payer: Self-pay | Admitting: Family Medicine

## 2022-02-25 MED ORDER — TRULICITY 0.75 MG/0.5ML ~~LOC~~ SOAJ: SUBCUTANEOUS | 1 refills | Status: DC

## 2022-03-14 ENCOUNTER — Other Ambulatory Visit: Payer: Self-pay

## 2022-03-14 MED ORDER — METFORMIN HCL 1000 MG PO TABS: 1000.0000 mg | ORAL_TABLET | Freq: Two times a day (BID) | ORAL | 0 refills | Status: DC

## 2022-04-06 ENCOUNTER — Other Ambulatory Visit: Payer: Self-pay

## 2022-04-06 MED ORDER — LISINOPRIL 10 MG PO TABS: 10.0000 mg | ORAL_TABLET | Freq: Every day | ORAL | 1 refills | Status: DC

## 2022-04-18 ENCOUNTER — Other Ambulatory Visit: Payer: Self-pay | Admitting: Family Medicine

## 2022-04-19 ENCOUNTER — Encounter: Payer: Self-pay | Admitting: Family Medicine

## 2022-05-01 ENCOUNTER — Other Ambulatory Visit: Payer: Self-pay | Admitting: Family Medicine

## 2022-05-02 ENCOUNTER — Ambulatory Visit (INDEPENDENT_AMBULATORY_CARE_PROVIDER_SITE_OTHER): Payer: Medicare Other | Admitting: Family Medicine

## 2022-05-02 ENCOUNTER — Encounter: Payer: Self-pay | Admitting: Family Medicine

## 2022-05-02 VITALS — BP 134/79 | HR 59 | Temp 98.0°F | Ht 66.75 in | Wt 175.0 lb

## 2022-05-02 DIAGNOSIS — Z23 Encounter for immunization: Secondary | ICD-10-CM | POA: Diagnosis not present

## 2022-05-02 DIAGNOSIS — I10 Essential (primary) hypertension: Secondary | ICD-10-CM

## 2022-05-02 DIAGNOSIS — E7800 Pure hypercholesterolemia, unspecified: Secondary | ICD-10-CM | POA: Diagnosis not present

## 2022-05-02 DIAGNOSIS — E119 Type 2 diabetes mellitus without complications: Secondary | ICD-10-CM

## 2022-05-02 LAB — MICROALBUMIN / CREATININE URINE RATIO
Creatinine,U: 124.3 mg/dL
Microalb Creat Ratio: 10.4 mg/g (ref 0.0–30.0)
Microalb, Ur: 13 mg/dL — ABNORMAL HIGH (ref 0.0–1.9)

## 2022-05-02 LAB — LIPID PANEL
Cholesterol: 86 mg/dL (ref 0–200)
HDL: 30.1 mg/dL — ABNORMAL LOW (ref >39.00)
LDL Cholesterol: 40 mg/dL (ref 0–99)
NonHDL: 55.94
Total CHOL/HDL Ratio: 3
Triglycerides: 79 mg/dL (ref 0.0–149.0)
VLDL: 15.8 mg/dL (ref 0.0–40.0)

## 2022-05-02 LAB — POCT GLYCOSYLATED HEMOGLOBIN (HGB A1C)
HbA1c POC (<> result, manual entry): 6 % (ref 4.0–5.6)
HbA1c, POC (controlled diabetic range): 6 % (ref 0.0–7.0)
HbA1c, POC (prediabetic range): 6 % (ref 5.7–6.4)
Hemoglobin A1C: 6 % — AB (ref 4.0–5.6)

## 2022-05-02 LAB — COMPREHENSIVE METABOLIC PANEL
ALT: 16 U/L (ref 0–53)
AST: 18 U/L (ref 0–37)
Albumin: 4.3 g/dL (ref 3.5–5.2)
Alkaline Phosphatase: 63 U/L (ref 39–117)
BUN: 13 mg/dL (ref 6–23)
CO2: 27 mEq/L (ref 19–32)
Calcium: 9.4 mg/dL (ref 8.4–10.5)
Chloride: 104 mEq/L (ref 96–112)
Creatinine, Ser: 1.06 mg/dL (ref 0.40–1.50)
GFR: 72.93 mL/min (ref >60.00)
Glucose, Bld: 72 mg/dL (ref 70–99)
Potassium: 4.6 mEq/L (ref 3.5–5.1)
Sodium: 139 mEq/L (ref 135–145)
Total Bilirubin: 1 mg/dL (ref 0.2–1.2)
Total Protein: 6.7 g/dL (ref 6.0–8.3)

## 2022-05-02 MED ORDER — METFORMIN HCL 1000 MG PO TABS: 1000.0000 mg | ORAL_TABLET | Freq: Two times a day (BID) | ORAL | 1 refills | Status: DC

## 2022-05-02 MED ORDER — TAMSULOSIN HCL 0.4 MG PO CAPS: ORAL_CAPSULE | ORAL | 1 refills | Status: DC

## 2022-05-02 MED ORDER — TRULICITY 0.75 MG/0.5ML ~~LOC~~ SOAJ: SUBCUTANEOUS | 5 refills | Status: DC

## 2022-05-02 MED ORDER — ATENOLOL 50 MG PO TABS: ORAL_TABLET | ORAL | 1 refills | Status: DC

## 2022-05-02 NOTE — Progress Notes (Signed)
OFFICE VISIT  05/02/2022  CC:  Chief Complaint  Patient presents with   Diabetes   Hyperlipidemia   Hypertension   HPI:    Patient is a 66 y.o. male who presents for 3 mo f/u DM, HTN, HLD, and PAD (for which he is treated with low dose xarelto per vasc MD). A/P as of last visit: "#1 type 2 diabetes, doing well on Trulicity 0.81 mg weekly, metformin 1000 mg twice a day, and glipizide XL 10 mg a day POC Hba1c today 5.9%.   #2 hypertension, well controlled on lisinopril 10 mg a day and atenolol 50 mg a day. Electrolytes and creatinine today.   #3 hyperlipidemia, LDL was 36 about 3 months ago. Continue atorvastatin 80 mg a day. Repeat lipids 3 months"  INTERIM HX: Is doing okay except he notes more problems with numbness lately. Says his right arm feels numb after he has been driving about 30 minutes or so.  Sometimes he notices both legs do the same thing.  He does not see any color change.  Hands feel cold some.  When he is up doing activities throughout his day he does not notice the symptoms at all. He denies any weakness.  Denies pain in the neck or back.  ROS as above, plus--> no fevers, no CP, no SOB, no wheezing, no cough, no dizziness, no HAs, no rashes, no melena/hematochezia.  No polyuria or polydipsia.  No myalgias or arthralgias.  No focal weakness, paresthesias, or tremors.  No acute vision or hearing abnormalities.  No dysuria or unusual/new urinary urgency or frequency.  No recent changes in lower legs. No n/v/d or abd pain.  No palpitations.    Past Medical History:  Diagnosis Date   Arthritis    Ascending aorta dilatation ([place])    a. Ascending aorta mildly dilated by echo 09/03/15.   Bladder cancer [facility]) 2012   Mass removed, then BCG by urologist (Dr. Alinda Money).  Recurrence documented on cysto + bladder washings 10/14/15--getting more maintenance BCG.  Cysto 01/20/16 OK, then more BCG given.  Cysto 04/13/16 c/w just having finished BCG but cytology/FISH ok.  Cysto  07/27/2016 c/w small area of BCG related inflamm, BCG stopped due to severe sx's. 12/2020 cysto benign   Bladder cancer ([place])    Surveillance cysto 11/02/16 cysto showed 1.5 cm raised erythematous lesion->bx benign. Subsequent cystoscopies unchanged, most recent 07/2019 and 04/2021.   BPH (benign prostatic hyperplasia)    with hx of acute prostatitis   CAD (coronary artery disease)    a. '05 MI > DES to circumflex. b. NSTEMI 08/2015 with total occlusion of the mid LAD along with 20% mid-distal Cx, 100% dCX, 40% D1, 50% pCx, 50% m-dRCA, 80% dRCA.Given >12 hours out from symptom onset, medical therapy recommended.   Diabetes mellitus type 2, controlled ([place])    HbA1c 02/2011 was 6.6%   DVT (deep venous thrombosis) (HCC)    Elevated PSA 09/2011   with prostate nodule.  Prostate bx ALL BENIGN 10/10/11 (Dr. Christella Hartigan followed by Urol--improved to 1.18 Jun 2017.  Nodule NOT palpable anymore on DRE at [facility] 01/2018 office f/u. Stable as of 08/2019. Slight incease 12/2020, urol to rpt 1 yr.   GERD (gastroesophageal reflux disease)    occasional   Headache(784.0)    cluster headaches a long time ago   History of adenomatous polyp of colon 09/23/2020   recall 09/2025   History of bronchitis    History of vertigo    HTN (hypertension)  Hyperlipidemia    Ischemic cardiomyopathy    a. 2D Echo 09/03/15: mild LVH, EF 40-45%, diffuse HK, akinesis of apical myocardium, grade 1 DD, aortic root dimention 22m, ascending aorta mildly dilated, mildly dilated RV/RA.    Myocardial infarction ([facility] 2005; 08/2015   followed by Dr. CStanford Breed  PAD ([place]ipheral artery disease) [place] 06/[place]   R LL abnl ABI and doppler->see PSH section of EMR for details.   [place]    Stable 2012-2017   Tobacco abuse    Wolf-Parkinson-White syndrome     Past Surgical History:  Procedure Laterality Date   BLADDER TUMOR EXCISION  11/08/10; 05/2015   urothelial carcinoma (Dr. BAlinda Money.  2016 high grade urothelial dysplasia  (carcinoma in situ), no invasive malignancy.   CARDIAC CATHETERIZATION  2005   with stent placement   CARDIAC CATHETERIZATION N/A 09/01/2015   Procedure: Left Heart Cath and Coronary Angiography;  Surgeon: HBelva Crome MD;  [facility];  Service: Cardiovascular;  Laterality: N/A;   COLONOSCOPY  2010; 09/23/20   2010 Dr. MIsa Rankin 09/2020 Dr. JRose Fillers3/2027.   CYSTOSCOPY W/ RETROGRADES Bilateral 06/22/2015   Procedure: CYSTOSCOPY WITH RETROGRADE PYELOGRAM;  Surgeon: LRaynelle Bring MD;  [facility];  Service: Urology;  Laterality: Bilateral;   CYSTOSCOPY W/ RETROGRADES N/A 11/17/2016   Procedure: CYSTOSCOPY WITH RETROGRADE PYELOGRAM;  Surgeon: LRaynelle Bring MD;  [facility];  Service: Urology;  Laterality: N/A;   CYSTOSCOPY WITH RETROGRADE PYELOGRAM, URETEROSCOPY AND STENT PLACEMENT N/A 06/06/2013   Procedure: CYSTOSCOPY WITH RETROGRADE PYELOGRAM;  Surgeon: LDutch Gray MD;  [facility];  Service: Urology;  Laterality: N/A;   LE ABI vascular studies  06/2019   R LL claudication + evidence of PAD (ABI very low AND popliteal and peroneal artery occlusion on dopler u/s) in R leg (L leg fine)->referred to Dr. AFletcher Anon  Conservative mgmt with walking program and low dose xarelto first, then consider revascularization.   lexiscan  2012   Normal/negative   [place]no trouble since last surgery   myocardial perfusion study  2009   Neg ischemia, EF 52%   TRANSURETHRAL RESECTION OF BLADDER TUMOR N/A 06/06/2013   Procedure: TRANSURETHRAL RESECTION OF BLADDER TUMOR (TURBT);  Surgeon: LDutch Gray MD;  [facility];  Service: Urology;  Laterality: N/A;.  PATH: squamous metaplasia, fibrosis with chronic inflammation, no malignancy.   TRANSURETHRAL RESECTION OF BLADDER TUMOR N/A 06/22/2015   Carcinoma in situ: no invasive malignancy.  Procedure: TRANSURETHRAL RESECTION OF BLADDER TUMOR (TURBT);  Surgeon:  LRaynelle Bring MD;  [facility];  Service: Urology;  Laterality: N/A;  **BLADDER BIOPSY*RESECTION**   TRANSURETHRAL RESECTION OF BLADDER TUMOR N/A 11/17/2016   Path: BENIGN UROTHELIUM WITH INFLAMMATION--no malignancy. Procedure: TRANSURETHRAL RESECTION OF BLADDER TUMOR (TURBT);  Surgeon: LRaynelle Bring MD;  [facility];  Service: Urology;  Laterality: N/A;   VASECTOMY      Outpatient Medications Prior to Visit  Medication Sig Dispense Refill   acetaminophen (TYLENOL) 500 MG tablet Take 1,000 mg by mouth daily as needed for mild pain or headache.     aspirin EC 81 MG tablet Take 81 mg by mouth daily.     atorvastatin (LIPITOR) 80 MG tablet Take 1 tablet (80 mg total) by mouth every evening. 90 tablet 3   calcium carbonate (TUMS - DOSED IN MG ELEMENTAL CALCIUM) 500 MG chewable tablet Chew 1 tablet by mouth daily as needed for indigestion or heartburn.  lisinopril (ZESTRIL) 10 MG tablet Take 1 tablet (10 mg total) by mouth daily. 90 tablet 1   loratadine (CLARITIN) 10 MG tablet Take 10 mg by mouth as needed for allergies.     rivaroxaban (XARELTO) 2.5 MG TABS tablet Take 1 tablet (2.5 mg total) by mouth 2 (two) times daily. 180 tablet 1   glipiZIDE (GLUCOTROL XL) 10 MG 24 hr tablet Take 1 tablet (10 mg total) by mouth daily with breakfast. 90 tablet 0   nitroGLYCERIN (NITROSTAT) 0.4 MG SL tablet PLACE 1 TABLET UNDER THE TONGUE EVERY 5 (FIVE) MINUTES AS NEEDED FOR CHEST PAIN (UP TO 3 DOSES). (Patient not taking: Reported on 01/31/2022) 25 tablet 3   atenolol (TENORMIN) 50 MG tablet TAKE 1 TABLET BY MOUTH EVERY DAY WITH LUNCH 90 tablet 1   Dulaglutide (TRULICITY) 0.86 PY/1.9JK SOPN ADMINISTER 0.75 MG UNDER THE SKIN 1 TIME A WEEK 2 mL 0   Dulaglutide (TRULICITY) 9.32 IZ/1.2WP SOPN INJECT 0.'75MG'$  INTO THE SKIN ONCE WEEKLY 2 mL 0   metFORMIN (GLUCOPHAGE) 1000 MG tablet Take 1 tablet (1,000 mg total) by mouth 2 (two) times daily with a meal. 180 tablet 0   tamsulosin (FLOMAX) 0.4 MG CAPS  capsule TAKE 1 CAPSULE BY MOUTH EVERY DAY AFTER SUPPER. 90 capsule 0   No facility-administered medications prior to visit.    No Known Allergies  ROS As per HPI  PE:    05/02/2022    8:17 AM 01/31/2022    8:23 AM 11/12/2021    8:07 AM  Vitals with BMI  Height 5' 6.75" 5' 6.75" 5' 6.75"  Weight 175 lbs 173 lbs 6 oz 177 lbs 3 oz  BMI 27.63 80.99 83.38  Systolic 250 539 95  Diastolic 79 76 65  Pulse 59 63 67    Physical Exam  Gen: Alert, well appearing.  Patient is oriented to person, place, time, and situation. AFFECT: pleasant, lucid thought and speech. CV: RRR, no m/r/g.   LUNGS: CTA bilat, nonlabored resps, good aeration in all lung fields. R supra and subclavicular regions without bruit.  Brachial and radial pulses 2+ bilat.  HANDS: pink.  Strength intact. Legs: pink, warm.  PT and DP 2+ bilat  LABS:  Last CBC Lab Results  Component Value Date   WBC 6.9 11/12/2021   HGB 15.7 11/12/2021   HCT 45.1 11/12/2021   MCV 91.4 11/12/2021   MCH 30.6 11/08/2016   RDW 14.2 11/12/2021   PLT 126.0 (L) 76/73/4193   Last metabolic panel Lab Results  Component Value Date   GLUCOSE 87 01/31/2022   NA 141 01/31/2022   K 4.5 01/31/2022   CL 107 01/31/2022   CO2 27 01/31/2022   BUN 14 01/31/2022   CREATININE 1.09 01/31/2022   GFRNONAA >60 11/08/2016   CALCIUM 9.3 01/31/2022   PROT 6.3 11/12/2021   ALBUMIN 4.2 11/12/2021   BILITOT 1.0 11/12/2021   ALKPHOS 67 11/12/2021   AST 16 11/12/2021   ALT 17 11/12/2021   ANIONGAP 9 11/08/2016   Last lipids Lab Results  Component Value Date   CHOL 78 11/12/2021   HDL 27.70 (L) 11/12/2021   LDLCALC 36 11/12/2021   LDLDIRECT 87.2 09/03/2010   TRIG 70.0 11/12/2021   CHOLHDL 3 11/12/2021   Last hemoglobin A1c Lab Results  Component Value Date   HGBA1C 6.0 (A) 05/02/2022   HGBA1C 6.0 05/02/2022   HGBA1C 6.0 05/02/2022   HGBA1C 6.0 05/02/2022   Last thyroid functions Lab Results  Component Value Date  TSH 0.96  08/26/2015   IMPRESSION AND PLAN:  #1 diabetes excellent control.  Hemoglobin A1c today is 6%. We will discontinue glipizide.  Continue Trulicity 3.73 mg/week and metformin 1000 mg twice daily. Electrolytes and creatinine today.  2.  Hypercholesterolemia.  Doing well on a atorvastatin 80 mg a day. Lipid panel and hepatic panel today.  3.  Hypertension, well controlled. Continue atenolol 50 mg a day, lisinopril 10 mg a day. Electrolytes and creatinine today.  4.  Intermittent numbness of extremities.  Primarily right arm from elbow down and both lower legs.  Unknown etiology. He does have a follow-up with his vascular MD next month.  An After Visit Summary was printed and given to the patient.  FOLLOW UP: Return in about 3 months (around 08/02/2022) for routine chronic illness f/u. Next CPE 10/2022  Signed:  Crissie Sickles, MD           05/02/2022

## 2022-05-31 ENCOUNTER — Encounter: Payer: Self-pay | Admitting: Cardiovascular Disease

## 2022-05-31 ENCOUNTER — Ambulatory Visit: Payer: Medicare Other | Attending: Cardiovascular Disease | Admitting: Cardiovascular Disease

## 2022-05-31 VITALS — BP 128/78 | HR 67 | Ht 66.0 in | Wt 173.0 lb

## 2022-05-31 DIAGNOSIS — I10 Essential (primary) hypertension: Secondary | ICD-10-CM | POA: Diagnosis not present

## 2022-05-31 DIAGNOSIS — I2510 Atherosclerotic heart disease of native coronary artery without angina pectoris: Secondary | ICD-10-CM | POA: Diagnosis not present

## 2022-05-31 DIAGNOSIS — E785 Hyperlipidemia, unspecified: Secondary | ICD-10-CM | POA: Diagnosis not present

## 2022-05-31 DIAGNOSIS — I739 Peripheral vascular disease, unspecified: Secondary | ICD-10-CM | POA: Diagnosis not present

## 2022-05-31 NOTE — Progress Notes (Signed)
Cardiology Office Note   Date:  05/31/2022   ID:  Mark Blankenship, DOB 01-Nov-1955, MRN [id number]  PCP:  Mark Sou, MD  Cardiologist:   Dr. Stanford Breed  No chief complaint on file.      History of Present Illness: Mark Blankenship is a 66 y.o. male who is here today for follow-up visit regarding peripheral arterial disease.   The patient has known history of coronary artery disease with previous myocardial infarction with an occluded LAD with late presentation, ischemic cardiomyopathy with an EF of 40 to 45%, type 2 diabetes for at least 20 years, essential hypertension and hyperlipidemia.  He is not a smoker but does have strong family history of coronary artery disease.   He is followed for right calf claudication with moderately reduced ABI due to an occluded popliteal artery. He has been treated medically and his symptoms improved with walking.  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   He has been doing very well overall with no chest pain or shortness of breath.  He reports mild right calf claudication.    He takes his medications regularly.  He reports improvement in his diabetes control.  He complains of right arm numbness if he drives more than 30 minutes.  No significant tightness in the muscles.   Past Medical History:  Diagnosis Date   Arthritis    Ascending aorta dilatation ([place])    a. Ascending aorta mildly dilated by echo 09/03/15.   Bladder cancer [facility]) 2012   Mass removed, then BCG by urologist (Dr. Alinda Money).  Recurrence documented on cysto + bladder washings 10/14/15--getting more maintenance BCG.  Cysto 01/20/16 OK, then more BCG given.  Cysto 04/13/16 c/w just having finished BCG but cytology/FISH ok.  Cysto 07/27/2016 c/w small area of BCG related inflamm, BCG stopped due to severe sx's. 12/2020 cysto benign   Bladder cancer ([place])    Surveillance cysto 11/02/16 cysto showed 1.5 cm raised erythematous lesion->bx benign. Subsequent cystoscopies unchanged, most  recent 07/2019 and 04/2021.   BPH (benign prostatic hyperplasia)    with hx of acute prostatitis   CAD (coronary artery disease)    a. '05 MI > DES to circumflex. b. NSTEMI 08/2015 with total occlusion of the mid LAD along with 20% mid-distal Cx, 100% dCX, 40% D1, 50% pCx, 50% m-dRCA, 80% dRCA.Given >12 hours out from symptom onset, medical therapy recommended.   Diabetes mellitus type 2, controlled ([place])    HbA1c 02/2011 was 6.6%   DVT (deep venous thrombosis) (HCC)    Elevated PSA 09/2011   with prostate nodule.  Prostate bx ALL BENIGN 10/10/11 (Dr. Christella Hartigan followed by Urol--improved to 1.18 Jun 2017.  Nodule NOT palpable anymore on DRE at [facility] 01/2018 office f/u. Stable as of 08/2019. Slight incease 12/2020, urol to rpt 1 yr.   GERD (gastroesophageal reflux disease)    occasional   Headache(784.0)    cluster headaches a long time ago   History of adenomatous polyp of colon 09/23/2020   recall 09/2025   History of bronchitis    History of vertigo    HTN (hypertension)    Hyperlipidemia    Ischemic cardiomyopathy    a. 2D Echo 09/03/15: mild LVH, EF 40-45%, diffuse HK, akinesis of apical myocardium, grade 1 DD, aortic root dimention 51m, ascending aorta mildly dilated, mildly dilated RV/RA.    Myocardial infarction ([facility] 2005; 08/2015   followed by Dr. CStanford Breed  PAD [place]) [place] 06[place]  R LL abnl ABI and doppler->see PSH section of EMR for details.   Thrombocytopenia ([place])    Stable 2012-2017   Tobacco abuse    Wolf-Parkinson-White syndrome     Past Surgical History:  Procedure Laterality Date   BLADDER TUMOR EXCISION  11/08/10; 05/2015   urothelial carcinoma (Dr. Alinda Money).  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   CARDIAC CATHETERIZATION  2005   with stent placement   CARDIAC CATHETERIZATION N/A 09/01/2015   Procedure: Left Heart Cath and Coronary Angiography;  Surgeon: Belva Crome, MD;  Location: [place] [facility];   Service: Cardiovascular;  Laterality: N/A;   COLONOSCOPY  2010; 09/23/20   2010 Dr. Isa Rankin. 09/2020 Dr. Rose Fillers 09/2025.   CYSTOSCOPY W/ RETROGRADES Bilateral 06/22/2015   Procedure: CYSTOSCOPY WITH RETROGRADE PYELOGRAM;  Surgeon: Raynelle Bring, MD;  [facility];  Service: Urology;  Laterality: Bilateral;   CYSTOSCOPY W/ RETROGRADES N/A 11/17/2016   Procedure: CYSTOSCOPY WITH RETROGRADE PYELOGRAM;  Surgeon: Raynelle Bring, MD;  [facility];  Service: Urology;  Laterality: N/A;   CYSTOSCOPY WITH RETROGRADE PYELOGRAM, URETEROSCOPY AND STENT PLACEMENT N/A 06/06/2013   Procedure: CYSTOSCOPY WITH RETROGRADE PYELOGRAM;  Surgeon: Dutch Gray, MD;  [facility];  Service: Urology;  Laterality: N/A;   LE ABI vascular studies  06/2019   R LL claudication + evidence of PAD (ABI very low AND popliteal and peroneal artery occlusion on dopler u/s) in R leg (L leg fine)->referred to Dr. Fletcher Anon.  Conservative mgmt with walking program and low dose xarelto first, then consider revascularization.   lexiscan  2012   Normal/negative   LUMBAR SPINE [place] ([place])-no trouble since last surgery   myocardial perfusion study  2009   Neg ischemia, EF 52%   TRANSURETHRAL RESECTION OF BLADDER TUMOR N/A 06/06/2013   Procedure: TRANSURETHRAL RESECTION OF BLADDER TUMOR (TURBT);  Surgeon: Dutch Gray, MD;  [facility];  Service: Urology;  Laterality: N/A;.  PATH: squamous metaplasia, fibrosis with chronic inflammation, no malignancy.   TRANSURETHRAL RESECTION OF BLADDER TUMOR N/A 06/22/2015   Carcinoma in situ: no invasive malignancy.  Procedure: TRANSURETHRAL RESECTION OF BLADDER TUMOR (TURBT);  Surgeon: Raynelle Bring, MD;  [facility];  Service: Urology;  Laterality: N/A;  **BLADDER BIOPSY*RESECTION**   TRANSURETHRAL RESECTION OF BLADDER TUMOR N/A 11/17/2016   Path: BENIGN UROTHELIUM WITH INFLAMMATION--no malignancy. Procedure: TRANSURETHRAL  RESECTION OF BLADDER TUMOR (TURBT);  Surgeon: Raynelle Bring, MD;  [facility];  Service: Urology;  Laterality: N/A;   VASECTOMY       Current Outpatient Medications  Medication Sig Dispense Refill   acetaminophen (TYLENOL) 500 MG tablet Take 1,000 mg by mouth daily as needed for mild pain or headache.     aspirin EC 81 MG tablet Take 81 mg by mouth daily.     atenolol (TENORMIN) 50 MG tablet TAKE 1 TABLET BY MOUTH EVERY DAY WITH LUNCH 90 tablet 1   atorvastatin (LIPITOR) 80 MG tablet Take 1 tablet (80 mg total) by mouth every evening. 90 tablet 3   Dulaglutide (TRULICITY) 1.61 WR/6.0AV SOPN INJECT 0.'75MG'$  INTO THE SKIN ONCE WEEKLY 2 mL 5   lisinopril (ZESTRIL) 10 MG tablet Take 1 tablet (10 mg total) by mouth daily. 90 tablet 1   metFORMIN (GLUCOPHAGE) 1000 MG tablet Take 1 tablet (1,000 mg total) by mouth 2 (two) times daily with a meal. 180 tablet 1   rivaroxaban (XARELTO) 2.5 MG TABS tablet Take  1 tablet (2.5 mg total) by mouth 2 (two) times daily. 180 tablet 1   tamsulosin (FLOMAX) 0.4 MG CAPS capsule TAKE 1 CAPSULE BY MOUTH EVERY DAY AFTER SUPPER. 90 capsule 1   calcium carbonate (TUMS - DOSED IN MG ELEMENTAL CALCIUM) 500 MG chewable tablet Chew 1 tablet by mouth daily as needed for indigestion or heartburn. (Patient not taking: Reported on 05/31/2022)     loratadine (CLARITIN) 10 MG tablet Take 10 mg by mouth as needed for allergies. (Patient not taking: Reported on 05/31/2022)     nitroGLYCERIN (NITROSTAT) 0.4 MG SL tablet PLACE 1 TABLET UNDER THE TONGUE EVERY 5 (FIVE) MINUTES AS NEEDED FOR CHEST PAIN (UP TO 3 DOSES). (Patient not taking: Reported on 05/31/2022) 25 tablet 3   No current facility-administered medications for this visit.    Allergies:   Patient has no known allergies.    Social History:  The patient  reports that he quit smoking about 6 years ago. His smoking use included cigarettes. He has a 10.00 pack-year smoking history. He has never used smokeless tobacco. He  reports that he does not drink alcohol and does not use drugs.   Family History:  The patient's family history includes Cancer in his father; Diabetes in his brother; Heart disease in his brother and paternal uncle; Hypertension in his father and mother; Multiple sclerosis in his brother.    ROS:  Please see the history of present illness.   Otherwise, review of systems are positive for none.   All other systems are reviewed and negative.    PHYSICAL EXAM: VS:  BP 128/78 (BP Location: Left Arm, Patient Position: Sitting, Cuff Size: Normal)   Pulse 67   Ht '5\' 6"'$  (1.676 m)   Wt 173 lb (78.5 kg)   SpO2 97%   BMI 27.92 kg/m  , BMI Body mass index is 27.92 kg/m. GEN: Well nourished, well developed, in no acute distress  HEENT: normal  Neck: no JVD, carotid bruits, or masses Cardiac: RRR; no murmurs, rubs, or gallops,no edema  Respiratory:  clear to auscultation bilaterally, normal work of breathing GI: soft, nontender, nondistended, + BS MS: no deformity or atrophy  Skin: warm and dry, no rash Neuro:  Strength and sensation are intact Psych: euthymic mood, full affect Vascular: Pulses in the right and left upper extremities are normal with no bruits.   EKG:  EKG is ordered today. EKG showed normal sinus rhythm with possible old inferior infarct.    Recent Labs: 11/12/2021: Hemoglobin 15.7; Platelets 126.0 05/02/2022: ALT 16; BUN 13; Creatinine, Ser 1.06; Potassium 4.6; Sodium 139    Lipid Panel    Component Value Date/Time   CHOL 86 05/02/2022 0854   TRIG 79.0 05/02/2022 0854   HDL 30.10 (L) 05/02/2022 0854   CHOLHDL 3 05/02/2022 0854   VLDL 15.8 05/02/2022 0854   LDLCALC 40 05/02/2022 0854   LDLDIRECT 87.2 09/03/2010 1110      Wt Readings from Last 3 Encounters:  05/31/22 173 lb (78.5 kg)  05/02/22 175 lb (79.4 kg)  01/31/22 173 lb 6.4 oz (78.7 kg)            No data to display            ASSESSMENT AND PLAN:  1.  Peripheral arterial disease with  right calf claudication: due to an occluded right popliteal artery.  He has stable mild right calf claudication which is not lifestyle limiting.  Continue medical therapy.  He is on long-term treatment  with low-dose Xarelto which seems to be well-tolerated.   2.  Coronary artery disease involving native coronary arteries without angina: Continue medical therapy.  3.  Mild ischemic cardiomyopathy: No signs of volume overload.  4.  Hyperlipidemia: I reviewed his most recent labs done in October which showed an LDL of 40 and triglyceride of 79.  Continue high-dose atorvastatin.  5.  Right arm numbness: Does not seem to be vascular in etiology.  His pulses are normal and he has no bruits.  Disposition:   FU with me in 12 months  Signed,  Kathlyn [place], MD  05/31/2022 9:19 AM    [place]

## 2022-05-31 NOTE — Patient Instructions (Signed)
Medication Instructions:  No changes *If you need a refill on your cardiac medications before your next appointment, please call your pharmacy*   Lab Work: None ordered If you have labs (blood work) drawn today and your tests are completely normal, you will receive your results only by: MyChart Message (if you have MyChart) OR A paper copy in the mail If you have any lab test that is abnormal or we need to change your treatment, we will call you to review the results.   Testing/Procedures: None ordered   Follow-Up: At [facility], you and your health needs are our priority.  As part of our continuing mission to provide you with exceptional heart care, we have created designated Provider Care Teams.  These Care Teams include your primary Cardiologist (physician) and Advanced Practice Providers (APPs -  Physician Assistants and Nurse Practitioners) who all work together to provide you with the care you need, when you need it.  We recommend signing up for the patient portal called "MyChart".  Sign up information is provided on this After Visit Summary.  MyChart is used to connect with patients for Virtual Visits (Telemedicine).  Patients are able to view lab/test results, encounter notes, upcoming appointments, etc.  Non-urgent messages can be sent to your provider as well.   To learn more about what you can do with MyChart, go to [email or web address].    Your next appointment:   12 month(s)  The format for your next appointment:   In Person  Provider:   Dr. Arida  Important Information About Sugar       

## 2022-07-20 ENCOUNTER — Other Ambulatory Visit: Payer: Self-pay | Admitting: Cardiovascular Disease

## 2022-07-20 DIAGNOSIS — I2510 Atherosclerotic heart disease of native coronary artery without angina pectoris: Secondary | ICD-10-CM

## 2022-07-20 NOTE — Progress Notes (Signed)
This encounter was created in error - please disregard.

## 2022-07-20 NOTE — Telephone Encounter (Signed)
Refill request

## 2022-07-20 NOTE — Telephone Encounter (Signed)
Prescription refill request for Xarelto received.  Indication: PAD Last office visit: 05/31/22 Fletcher Anon)  Weight: 78.5kg Age: 66 Scr: 1.06 (05/02/22)  CrCl: 75.72m/min  Dose is appropriate based on Dr. ATyrell Antonionote OV note it states "OV note by Dr. AFletcher Anon"Continue low-dose Xarelto twice daily"  "If cost becomes an issue, we could consider switching him to clopidogrel",  Will send in refill to requested pharmacy.

## 2022-08-02 ENCOUNTER — Ambulatory Visit: Payer: BLUE CROSS/BLUE SHIELD | Admitting: Family Medicine

## 2022-08-02 ENCOUNTER — Encounter: Payer: Self-pay | Admitting: Family Medicine

## 2022-08-02 VITALS — BP 121/78 | HR 64 | Temp 97.6°F | Ht 66.0 in | Wt 173.2 lb

## 2022-08-02 DIAGNOSIS — I10 Essential (primary) hypertension: Secondary | ICD-10-CM

## 2022-08-02 DIAGNOSIS — E785 Hyperlipidemia, unspecified: Secondary | ICD-10-CM

## 2022-08-02 DIAGNOSIS — E119 Type 2 diabetes mellitus without complications: Secondary | ICD-10-CM | POA: Diagnosis not present

## 2022-08-02 DIAGNOSIS — M170 Bilateral primary osteoarthritis of knee: Secondary | ICD-10-CM | POA: Diagnosis not present

## 2022-08-02 LAB — POCT GLYCOSYLATED HEMOGLOBIN (HGB A1C)
HbA1c POC (<> result, manual entry): 7.4 % (ref 4.0–5.6)
HbA1c, POC (controlled diabetic range): 7.4 % — AB (ref 0.0–7.0)
HbA1c, POC (prediabetic range): 7.4 % — AB (ref 5.7–6.4)
Hemoglobin A1C: 7.4 % — AB (ref 4.0–5.6)

## 2022-08-02 NOTE — Progress Notes (Signed)
OFFICE VISIT  08/02/2022  CC:  Chief Complaint  Patient presents with   Medical Management of Chronic Issues    Patient is a 67 y.o. male who presents for 51-monthfollow-up diabetes, hypercholesterolemia, and hypertension. A/P as of last visit: "1 diabetes excellent control.  Hemoglobin A1c today is 6%. We will discontinue glipizide.  Continue Trulicity 06.60mg/week and metformin 1000 mg twice daily. Electrolytes and creatinine today.   2.  Hypercholesterolemia.  Doing well on a atorvastatin 80 mg a day. Lipid panel and hepatic panel today.   3.  Hypertension, well controlled. Continue atenolol 50 mg a day, lisinopril 10 mg a day. Electrolytes and creatinine today.   4.  Intermittent numbness of extremities.  Primarily right arm from elbow down and both lower legs.  Unknown etiology. He does have a follow-up with his vascular MD next month."  INTERIM HX: Feeling well other than chronic arthritic knees bothering him.  However they are not bothering him as much the last few days.  Diet not as strict lately, not checking glucoses lately, either. Activity level lower lately.  R knee still gives him pain and intermittent decreased functioning. He had vascular surgery follow-up 05/31/2022--all was good, no changes made.  ROS as above, plus--> no fevers, no CP, no SOB, no wheezing, no cough, no dizziness, no HAs, no rashes, no melena/hematochezia.  No polyuria or polydipsia.  No myalgias.  No focal weakness, paresthesias, or tremors.  No acute vision or hearing abnormalities.  No dysuria or unusual/new urinary urgency or frequency.  No recent changes in lower legs. No n/v/d or abd pain.  No palpitations.    Past Medical History:  Diagnosis Date   Arthritis    Ascending aorta dilatation ([place]    a. Ascending aorta mildly dilated by echo 09/03/15.   Bladder cancer ([facility] 2012   Mass removed, then BCG by urologist (Dr. BAlinda Money.  Recurrence documented on cysto + bladder washings  10/14/15--getting more maintenance BCG.  Cysto 01/20/16 OK, then more BCG given.  Cysto 04/13/16 c/w just having finished BCG but cytology/FISH ok.  Cysto 07/27/2016 c/w small area of BCG related inflamm, BCG stopped due to severe sx's. 12/2020 cysto benign   [place] cancer ([place]    Surveillance cysto 11/02/16 cysto showed 1.5 cm raised erythematous lesion->bx benign. Subsequent cystoscopies unchanged, most recent 07/2019 and 04/2021.   BPH (benign prostatic hyperplasia)    with hx of acute prostatitis   CAD (coronary artery disease)    a. '05 MI > DES to circumflex. b. NSTEMI 08/2015 with total occlusion of the mid LAD along with 20% mid-distal Cx, 100% dCX, 40% D1, 50% pCx, 50% m-dRCA, 80% dRCA.Given >12 hours out from symptom onset, medical therapy recommended.   Diabetes mellitus type 2, controlled ([place]    HbA1c 02/2011 was 6.6%   DVT (deep venous thrombosis) (HCC)    Elevated PSA 09/2011   with prostate nodule.  Prostate bx ALL BENIGN 10/10/11 (Dr. BChristella Hartiganfollowed by Urol--improved to 1.18 Jun 2017.  [facility]. Stable as of 08/2019. Slight incease 12/2020, urol to rpt 1 yr.   GERD (gastroesophageal reflux disease)    occasional   Headache(784.0)    cluster headaches a long time ago   History of adenomatous polyp of colon 09/23/2020   recall 09/2025   History of bronchitis    History of vertigo    HTN (hypertension)    Hyperlipidemia    Ischemic cardiomyopathy  a. 2D Echo 09/03/15: mild LVH, EF 40-45%, diffuse HK, akinesis of apical myocardium, grade 1 DD, aortic root dimention 41m, ascending aorta mildly dilated, mildly dilated RV/RA.    Myocardial infarction ([facility] 2005; 08/2015   followed by Dr. CStanford Breed  PAD (peripheral artery disease) ([place] 06/2019   R LL abnl ABI and doppler->see PSH section of EMR for details.   Thrombocytopenia ([place]    Stable 2012-2017   Tobacco abuse    Wolf-Parkinson-White syndrome     Past Surgical History:   Procedure Laterality Date   BLADDER TUMOR EXCISION  11/08/10; 05/2015   urothelial carcinoma (Dr. BAlinda Money.  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   CARDIAC CATHETERIZATION  2005   with stent placement   CARDIAC CATHETERIZATION N/A 09/01/2015   Procedure: Left Heart Cath and Coronary Angiography;  Surgeon: HBelva Crome MD;  [facility] [place];  Service: Cardiovascular;  Laterality: N/A;   COLONOSCOPY  2010; 09/23/20   2010 Dr. MIsa Rankin 09/2020 Dr. JRose Fillers3/2027.   CYSTOSCOPY W/ RETROGRADES Bilateral 06/22/2015   Procedure: CYSTOSCOPY WITH RETROGRADE PYELOGRAM;  Surgeon: LRaynelle Bring MD;  [facility];  Service: Urology;  Laterality: Bilateral;   CYSTOSCOPY W/ RETROGRADES N/A 11/17/2016   Procedure: CYSTOSCOPY WITH RETROGRADE PYELOGRAM;  Surgeon: LRaynelle Bring MD;  [facility];  Service: Urology;  Laterality: N/A;   CYSTOSCOPY WITH RETROGRADE PYELOGRAM, URETEROSCOPY AND STENT PLACEMENT N/A 06/06/2013   Procedure: CYSTOSCOPY WITH RETROGRADE PYELOGRAM;  Surgeon: LDutch Gray MD;  [facility];  Service: Urology;  Laterality: N/A;   LE ABI vascular studies  06/2019   R LL claudication + evidence of PAD (ABI very low AND popliteal and peroneal artery occlusion on dopler u/s) in R leg (L leg fine)->referred to Dr. AFletcher Anon  Conservative mgmt with walking program and low dose xarelto first, then consider revascularization.   lexiscan  2012   Normal/negative   [place]no trouble since last surgery   myocardial perfusion study  2009   Neg ischemia, EF 52%   TRANSURETHRAL RESECTION OF BLADDER TUMOR N/A 06/06/2013   Procedure: TRANSURETHRAL RESECTION OF BLADDER TUMOR (TURBT);  Surgeon: LDutch Gray MD;  [facility];  Service: Urology;  Laterality: N/A;.  PATH: squamous metaplasia, fibrosis with chronic inflammation, no malignancy.   TRANSURETHRAL RESECTION  OF BLADDER TUMOR N/A 06/22/2015   Carcinoma in situ: no invasive malignancy.  Procedure: TRANSURETHRAL RESECTION OF BLADDER TUMOR (TURBT);  Surgeon: LRaynelle Bring MD;  [facility];  Service: Urology;  Laterality: N/A;  **BLADDER BIOPSY*RESECTION**   TRANSURETHRAL RESECTION OF BLADDER TUMOR N/A 11/17/2016   Path: BENIGN UROTHELIUM WITH INFLAMMATION--no malignancy. Procedure: TRANSURETHRAL RESECTION OF BLADDER TUMOR (TURBT);  Surgeon: LRaynelle Bring MD;  [facility];  Service: Urology;  Laterality: N/A;   VASECTOMY      Outpatient Medications Prior to Visit  Medication Sig Dispense Refill   acetaminophen (TYLENOL) 500 MG tablet Take 1,000 mg by mouth daily as needed for mild pain or headache.     aspirin EC 81 MG tablet Take 81 mg by mouth daily.     atenolol (TENORMIN) 50 MG tablet TAKE 1 TABLET BY MOUTH EVERY DAY WITH LUNCH 90 tablet 1   atorvastatin (LIPITOR) 80 MG tablet Take 1 tablet (80 mg total) by mouth every evening. 90 tablet 3   calcium carbonate (TUMS - DOSED IN MG ELEMENTAL CALCIUM) 500 MG chewable tablet Chew 1  tablet by mouth daily as needed for indigestion or heartburn.     Dulaglutide (TRULICITY) 6.44 IH/4.7QQ SOPN INJECT 0.'75MG'$  INTO THE SKIN ONCE WEEKLY 2 mL 5   lisinopril (ZESTRIL) 10 MG tablet Take 1 tablet (10 mg total) by mouth daily. 90 tablet 1   loratadine (CLARITIN) 10 MG tablet Take 10 mg by mouth as needed for allergies.     metFORMIN (GLUCOPHAGE) 1000 MG tablet Take 1 tablet (1,000 mg total) by mouth 2 (two) times daily with a meal. 180 tablet 1   tamsulosin (FLOMAX) 0.4 MG CAPS capsule TAKE 1 CAPSULE BY MOUTH EVERY DAY AFTER SUPPER. 90 capsule 1   XARELTO 2.5 MG TABS tablet TAKE 1 TABLET(2.5 MG) BY MOUTH TWICE DAILY 180 tablet 1   nitroGLYCERIN (NITROSTAT) 0.4 MG SL tablet PLACE 1 TABLET UNDER THE TONGUE EVERY 5 (FIVE) MINUTES AS NEEDED FOR CHEST PAIN (UP TO 3 DOSES). (Patient not taking: Reported on 05/31/2022) 25 tablet 3   No facility-administered  medications prior to visit.    No Known Allergies  Review of Systems As per HPI  PE:    08/02/2022    8:39 AM 05/31/2022    9:10 AM 05/02/2022    8:17 AM  Vitals with BMI  Height '5\' 6"'$  '5\' 6"'$  5' 6.75"  Weight 173 lbs 3 oz 173 lbs 175 lbs  BMI 27.97 59.56 38.75  Systolic 643 329 518  Diastolic 78 78 79  Pulse 64 67 59     Physical Exam  Gen: Alert, well appearing.  Patient is oriented to person, place, time, and situation. AFFECT: pleasant, lucid thought and speech. No further exam today  LABS:  Last CBC Lab Results  Component Value Date   WBC 6.9 11/12/2021   HGB 15.7 11/12/2021   HCT 45.1 11/12/2021   MCV 91.4 11/12/2021   MCH 30.6 11/08/2016   RDW 14.2 11/12/2021   PLT 126.0 (L) 84/16/6063   Last metabolic panel Lab Results  Component Value Date   GLUCOSE 72 05/02/2022   NA 139 05/02/2022   K 4.6 05/02/2022   CL 104 05/02/2022   CO2 27 05/02/2022   BUN 13 05/02/2022   CREATININE 1.06 05/02/2022   GFRNONAA >60 11/08/2016   CALCIUM 9.4 05/02/2022   PROT 6.7 05/02/2022   ALBUMIN 4.3 05/02/2022   BILITOT 1.0 05/02/2022   ALKPHOS 63 05/02/2022   AST 18 05/02/2022   ALT 16 05/02/2022   ANIONGAP 9 11/08/2016   Last lipids Lab Results  Component Value Date   CHOL 86 05/02/2022   HDL 30.10 (L) 05/02/2022   LDLCALC 40 05/02/2022   LDLDIRECT 87.2 09/03/2010   TRIG 79.0 05/02/2022   CHOLHDL 3 05/02/2022   Last hemoglobin A1c Lab Results  Component Value Date   HGBA1C 7.4 (A) 08/02/2022   HGBA1C 7.4 08/02/2022   HGBA1C 7.4 (A) 08/02/2022   HGBA1C 7.4 (A) 08/02/2022   Last thyroid functions Lab Results  Component Value Date   TSH 0.96 08/26/2015   IMPRESSION AND PLAN:  #1 diabetes, control worse last 3 months-> hemoglobin A1c rose from 6% to 7.4%. Discussed options today: He wants to work on improving his diet and activity level over the next 3 months.  No medication changes today--> continue Trulicity 0.16 mg weekly and metformin 1000 mg twice  daily. Electrolytes and creatinine have shown stability when checked every 3 months, most recently October 2023.  We will repeat these at next follow-up in 3 months.  #2 hyperlipidemia, doing well on  a atorvastatin 80 mg a day. LDL was 40 about 3 months ago. Repeat lipid panel in 3 months.  #3 hypertension, well-controlled on atenolol 50 mg a day and lisinopril 10 mg a day.  4.  Bilateral knee osteoarthritis. He has had moderately good response to steroid injection in the knees in the past. We did not do this today. Discussed option of getting reevaluation by orthopedic surgeon (he says he got evaluation back in 2000 and he was told he was too young for TKA at that time). He will let me know if he wants to take this route in the future. Tylenol as needed. Avoid NSAIDs due to being on aspirin and Xarelto.  An After Visit Summary was printed and given to the patient.  FOLLOW UP: Return in about 3 months (around 11/01/2022) for annual CPE (fasting).  Signed:  Crissie Sickles, MD           08/02/2022

## 2022-09-05 NOTE — Progress Notes (Signed)
HPI: FU coronary disease status post drug-eluting stent to the circumflex in August 2005. Patient admitted 2/17 with myocardial infarction. Cardiac catheterization February 2017 showed an occluded distal circumflex. The mid to distal LAD was occluded as well. Ejection fraction 40-50%. Patient was treated medically because of late presentation. Echocardiogram February 2017 showed ejection fraction 40-45% with akinesis of the apical myocardium. Mild right atrial and right ventricular enlargement.  Lower extremity Dopplers December 2020 showed occlusion of the popliteal artery on the right, occlusion of the peroneal artery on the right and no significant focal stenosis on the left; ABI moderate on the right.  Patient seen by Dr. Fletcher Anon and walking program recommended.  If symptoms persisted angiography and intervention could be considered.  Abdominal ultrasound December 2021 showed no aneurysm.  Since I last saw him, he denies dyspnea, chest pain, palpitations or syncope.  Current Outpatient Medications  Medication Sig Dispense Refill   acetaminophen (TYLENOL) 500 MG tablet Take 1,000 mg by mouth daily as needed for mild pain or headache.     aspirin EC 81 MG tablet Take 81 mg by mouth daily.     atenolol (TENORMIN) 50 MG tablet TAKE 1 TABLET BY MOUTH EVERY DAY WITH LUNCH 90 tablet 1   atorvastatin (LIPITOR) 80 MG tablet Take 1 tablet (80 mg total) by mouth every evening. 90 tablet 3   calcium carbonate (TUMS - DOSED IN MG ELEMENTAL CALCIUM) 500 MG chewable tablet Chew 1 tablet by mouth daily as needed for indigestion or heartburn.     Dulaglutide (TRULICITY) [id number] 0000000 SOPN INJECT 0.'75MG'$  INTO THE SKIN ONCE WEEKLY 2 mL 5   lisinopril (ZESTRIL) 10 MG tablet Take 1 tablet (10 mg total) by mouth daily. 90 tablet 1   loratadine (CLARITIN) 10 MG tablet Take 10 mg by mouth as needed for allergies.     metFORMIN (GLUCOPHAGE) 1000 MG tablet Take 1 tablet (1,000 mg total) by mouth 2 (two) times daily with  a meal. 180 tablet 1   nitroGLYCERIN (NITROSTAT) 0.4 MG SL tablet PLACE 1 TABLET UNDER THE TONGUE EVERY 5 (FIVE) MINUTES AS NEEDED FOR CHEST PAIN (UP TO 3 DOSES). 25 tablet 3   tamsulosin (FLOMAX) 0.4 MG CAPS capsule TAKE 1 CAPSULE BY MOUTH EVERY DAY AFTER SUPPER. 90 capsule 1   XARELTO 2.5 MG TABS tablet TAKE 1 TABLET(2.5 MG) BY MOUTH TWICE DAILY 180 tablet 1   No current facility-administered medications for this visit.     Past Medical History:  Diagnosis Date   Arthritis    Ascending aorta dilatation ([place])    a. Ascending aorta mildly dilated by echo 09/03/15.   Bladder cancer [facility]) 2012   Mass removed, then BCG by urologist (Dr. Alinda Money).  Recurrence documented on cysto + bladder washings 10/14/15--getting more maintenance BCG.  Cysto 01/20/16 OK, then more BCG given.  Cysto 04/13/16 c/w just having finished BCG but cytology/FISH ok.  Cysto 07/27/2016 c/w small area of BCG related inflamm, BCG stopped due to severe sx's. 12/2020 cysto benign   Bladder cancer ([place])    Surveillance cysto 11/02/16 cysto showed 1.5 cm raised erythematous lesion->bx benign. Subsequent cystoscopies unchanged, most recent 07/2019 and 04/2021.   BPH (benign prostatic hyperplasia)    with hx of acute prostatitis   CAD (coronary artery disease)    a. '05 MI > DES to circumflex. b. NSTEMI 08/2015 with total occlusion of the mid LAD along with 20% mid-distal Cx, 100% dCX, 40% D1, 50% pCx, 50% m-dRCA, 80% dRCA.Given >  12 hours out from symptom onset, medical therapy recommended.   Diabetes mellitus type 2, controlled ([place])    HbA1c 02/2011 was 6.6%   DVT (deep venous thrombosis) (HCC)    Elevated PSA 09/2011   with prostate nodule.  Prostate bx ALL BENIGN 10/10/11 (Dr. Christella Hartigan followed by Urol--improved to 1.18 Jun 2017.  Nodule NOT palpable anymore on DRE at [facility] 01/2018 office f/u. Stable as of 08/2019. Slight incease 12/2020, urol to rpt 1 yr.   GERD (gastroesophageal reflux disease)    occasional   Headache(784.0)     cluster headaches a long time ago   History of adenomatous polyp of colon 09/23/2020   recall 09/2025   History of bronchitis    History of vertigo    HTN (hypertension)    Hyperlipidemia    Ischemic cardiomyopathy    a. 2D Echo 09/03/15: mild LVH, EF 40-45%, diffuse HK, akinesis of apical myocardium, grade 1 DD, aortic root dimention 103m, ascending aorta mildly dilated, mildly dilated RV/RA.    Myocardial infarction ([facility] 2005; 08/2015   followed by Dr. CStanford Breed  PAD (peripheral artery disease) ([place] 06/2019   R LL abnl ABI and doppler->see PSH section of EMR for details.   Thrombocytopenia ([place]    Stable 2012-2017   Tobacco abuse    Wolf-Parkinson-White syndrome     Past Surgical History:  Procedure Laterality Date   BLADDER TUMOR EXCISION  11/08/10; 05/2015   urothelial carcinoma (Dr. BAlinda Money.  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   CARDIAC CATHETERIZATION  2005   with stent placement   CARDIAC CATHETERIZATION N/A 09/01/2015   Procedure: Left Heart Cath and Coronary Angiography;  Surgeon: HBelva Crome MD;  [place];  Service: Cardiovascular;  Laterality: N/A;   COLONOSCOPY  2010; 09/23/20   2010 Dr. MIsa Rankin 09/2020 Dr. JRose Fillers3/2027.   CYSTOSCOPY W/ RETROGRADES Bilateral 06/22/2015   Procedure: CYSTOSCOPY WITH RETROGRADE PYELOGRAM;  Surgeon: LRaynelle Bring MD;  [facility];  Service: Urology;  Laterality: Bilateral;   CYSTOSCOPY W/ RETROGRADES N/A 11/17/2016   Procedure: CYSTOSCOPY WITH RETROGRADE PYELOGRAM;  Surgeon: LRaynelle Bring MD;  [facility];  Service: Urology;  Laterality: N/A;   CYSTOSCOPY WITH RETROGRADE PYELOGRAM, URETEROSCOPY AND STENT PLACEMENT N/A 06/06/2013   Procedure: CYSTOSCOPY WITH RETROGRADE PYELOGRAM;  Surgeon: LDutch Gray MD;  [facility];  Service: Urology;  Laterality: N/A;   LE ABI vascular studies  06/2019   R LL claudication + evidence of PAD (ABI very  low AND popliteal and peroneal artery occlusion on dopler u/s) in R leg (L leg fine)->referred to Dr. AFletcher Anon  Conservative mgmt with walking program and low dose xarelto first, then consider revascularization.   lexiscan  2012   Normal/negative   LUMBAR SPINE [place])-no trouble since last surgery   myocardial perfusion study  2009   Neg ischemia, EF 52%   TRANSURETHRAL RESECTION OF BLADDER TUMOR N/A 06/06/2013   Procedure: TRANSURETHRAL RESECTION OF BLADDER TUMOR (TURBT);  Surgeon: LDutch Gray MD;  [facility];  Service: Urology;  Laterality: N/A;.  PATH: squamous metaplasia, fibrosis with chronic inflammation, no malignancy.   TRANSURETHRAL RESECTION OF BLADDER TUMOR N/A 06/22/2015   Carcinoma in situ: no invasive malignancy.  Procedure: TRANSURETHRAL RESECTION OF BLADDER TUMOR (TURBT);  Surgeon: LRaynelle Bring MD;  [facility];  Service: Urology;  Laterality: N/A;  **BLADDER BIOPSY*RESECTION**   TRANSURETHRAL RESECTION OF BLADDER  TUMOR N/A 11/17/2016   Path: BENIGN UROTHELIUM WITH INFLAMMATION--no malignancy. Procedure: TRANSURETHRAL RESECTION OF BLADDER TUMOR (TURBT);  Surgeon: Raynelle Bring, MD;  [facility];  Service: Urology;  Laterality: N/A;   VASECTOMY      Social History   Socioeconomic History   Marital status: Married    Spouse name: Not on file   Number of children: Not on file   Years of education: Not on file   Highest education level: 12th grade  Occupational History   Not on file  Tobacco Use   Smoking status: Former    Packs/day: 0.25    Years: 40.00    Total pack years: 10.00    Types: Cigarettes    Quit date: 09/05/2015    Years since quitting: 7.0   Smokeless tobacco: Never  Vaping Use   Vaping Use: Never used  Substance and Sexual Activity   Alcohol use: No    Alcohol/week: 0.0 standard drinks of alcohol   Drug use: No   Sexual activity: Not on file  Other Topics Concern   Not on file  Social  History Narrative   Married, 2 children, 4 grandchildren--[place].   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   Tob: 30 pack-yr hx, quit 08/2011.   No alcohol or drug use.   Active on farm but no formal exercise regimen.   Social Determinants of Health   Financial Resource Strain: Low Risk  (09/14/2022)   Overall Financial Resource Strain (CARDIA)    Difficulty of Paying Living Expenses: Not hard at all  Food Insecurity: No Food Insecurity (09/14/2022)   Hunger Vital Sign    Worried About Running Out of Food in the Last Year: Never true    Ran Out of Food in the Last Year: Never true  Transportation Needs: No Transportation Needs (09/14/2022)   PRAPARE - Hydrologist (Medical): No    Lack of Transportation (Non-Medical): No  Physical Activity: Sufficiently Active (09/14/2022)   Exercise Vital Sign    Days of Exercise per Week: 7 days    Minutes of Exercise per Session: 150+ min  Recent Concern: Physical Activity - Insufficiently Active (07/29/2022)   Exercise Vital Sign    Days of Exercise per Week: 4 days    Minutes of Exercise per Session: 20 min  Stress: No Stress Concern Present (09/14/2022)   [place]    Feeling of Stress : Not at all  Social Connections: Moderately Integrated (09/14/2022)   Social Connection and Isolation Panel [NHANES]    Frequency of Communication with Friends and Family: More than three times a week    Frequency of Social Gatherings with Friends and Family: More than three times a week    Attends Religious Services: More than 4 times per year    Active Member of [employer] or Organizations: No    Attends Archivist Meetings: Never    Marital Status: Married  Human resources officer Violence: Not At Risk (09/14/2022)   Humiliation, Afraid, Rape, and Kick  questionnaire    Fear of Current or Ex-Partner: No    Emotionally Abused: No    Physically Abused: No    Sexually Abused: No    Family History  Problem Relation Age of Onset   Hypertension Mother    Cancer Father  colon and prostate, mets to lungs.   Hypertension Father    Heart disease Brother        d. 44 MI   Multiple sclerosis Brother    Diabetes Brother    Heart disease Paternal Uncle    Colon cancer Neg Hx    Colon polyps Neg Hx    Esophageal cancer Neg Hx    Stomach cancer Neg Hx     ROS: no fevers or chills, productive cough, hemoptysis, dysphasia, odynophagia, melena, hematochezia, dysuria, hematuria, rash, seizure activity, orthopnea, PND, pedal edema, claudication. Remaining systems are negative.  Physical Exam: Well-developed well-nourished in no acute distress.  Skin is warm and dry.  HEENT is normal.  Neck is supple.  Chest is clear to auscultation with normal expansion.  Cardiovascular exam is regular rate and rhythm.  Abdominal exam nontender or distended. No masses palpated. Extremities show no edema. neuro grossly intact   A/P  1 coronary artery disease-patient denies chest pain.  Continue aspirin and statin.  2 hypertension-patient's blood pressure is controlled.  Continue present medications and follow-up.  3 ischemic cardiomyopathy-repeat echocardiogram.  Continue ACE inhibitor; change atenolol to Toprol 50 mg daily.  I have provided the name of Delene Loll and he will see if affordable.  If so we will discontinue lisinopril and 48 hours later start Entresto 24/26 twice daily.  Would then check potassium and renal function 1 week later.  4 peripheral vascular disease-per Dr. Sophronia Simas.  He is on low-dose Xarelto for this issue.  5 hyperlipidemia-continue statin.  Kirk Ruths, MD

## 2022-09-06 ENCOUNTER — Telehealth: Payer: Self-pay

## 2022-09-06 NOTE — Patient Instructions (Signed)
Visit Information  Thank you for taking time to visit with me today. Please don't hesitate to contact me if I can be of assistance to you.   Following are the goals we discussed today:   Goals Addressed             This Visit's Progress    COMPLETED: Care Coordination Activities-No follow up required       Interventions Today    Flowsheet Row Most Recent Value  Chronic Disease   Chronic disease during today's visit Diabetes  General Interventions   General Interventions Discussed/Reviewed General Interventions Discussed, Annual Eye Exam, Annual Foot Exam, Doctor Visits, Health Screening  Doctor Visits Discussed/Reviewed Doctor Visits Discussed, Annual Wellness Visits  Health Screening Colonoscopy  Exercise Interventions   Exercise Discussed/Reviewed Exercise Discussed  Education Interventions   Education Provided Provided Education  Provided Verbal Education On Nutrition, Eye Care, Blood Sugar Monitoring, Medication               If you are experiencing a Mental Health or [place] or need someone to talk to, please call the Suicide and Crisis Lifeline: 61   Patient verbalizes understanding of instructions and care plan provided today and agrees to view in [place]. Active MyChart status and patient understanding of how to access instructions and care plan via MyChart confirmed with patient.     No further follow up required: decline  Jone Baseman, RN, MSN [place] [facility] Care Management Coordinator Direct Line [phone]

## 2022-09-06 NOTE — Patient Outreach (Signed)
  Care Coordination   Initial Visit Note   09/06/2022 Name: JARL SELLITTO MRN: [id number] DOB: Mar 06, 1956  [name] is a 67 y.o. year old male who sees McGowen, Adrian Blackwater, MD for primary care. I spoke with  [name] by phone today.  What matters to the patients health and wellness today?  none    Goals Addressed             This Visit's Progress    COMPLETED: Care Coordination Activities-No follow up required       Interventions Today    Flowsheet Row Most Recent Value  Chronic Disease   Chronic disease during today's visit Diabetes  General Interventions   General Interventions Discussed/Reviewed General Interventions Discussed, Annual Eye Exam, Annual Foot Exam, Doctor Visits, Health Screening  Doctor Visits Discussed/Reviewed Doctor Visits Discussed, Annual Wellness Visits  Health Screening Colonoscopy  Exercise Interventions   Exercise Discussed/Reviewed Exercise Discussed  Education Interventions   Education Provided Provided Education  Provided Verbal Education On Nutrition, Eye Care, Blood Sugar Monitoring, Medication              SDOH assessments and interventions completed:  Yes  SDOH Interventions Today    Flowsheet Row Most Recent Value  SDOH Interventions   Food Insecurity Interventions Intervention Not Indicated  Housing Interventions Intervention Not Indicated        Care Coordination Interventions:  Yes, provided   Follow up plan: No further intervention required.   Encounter Outcome:  Pt. Visit Completed   Jone Baseman, RN, MSN [facility] Care Management Care Management Coordinator Direct Line [phone]'

## 2022-09-07 ENCOUNTER — Ambulatory Visit: Payer: BLUE CROSS/BLUE SHIELD

## 2022-09-14 ENCOUNTER — Ambulatory Visit (INDEPENDENT_AMBULATORY_CARE_PROVIDER_SITE_OTHER): Payer: Medicare Other

## 2022-09-14 VITALS — Wt 173.0 lb

## 2022-09-14 DIAGNOSIS — Z0000 Encounter for general adult medical examination without abnormal findings: Secondary | ICD-10-CM

## 2022-09-14 NOTE — Progress Notes (Signed)
I connected with  Mark Blankenship on 09/14/22 by a audio enabled telemedicine application and verified that I am speaking with the correct person using two identifiers.  Patient Location: Home  Provider Location: Home Office  I discussed the limitations of evaluation and management by telemedicine. The patient expressed understanding and agreed to proceed.   Subjective:   Mark Blankenship is a 67 y.o. male who presents for Medicare Annual/Subsequent preventive examination.  Review of Systems     Cardiac Risk Factors include: advanced age (>69mn, >>2women);dyslipidemia;male gender;hypertension;diabetes mellitus     Objective:    Today's Vitals   09/14/22 0933  Weight: 173 lb (78.5 kg)   Body mass index is 27.92 kg/m.     09/14/2022    9:37 AM 09/01/2021    2:36 PM 11/17/2016   10:54 AM 11/08/2016   10:03 AM 09/03/2015    7:39 AM 06/16/2015   10:15 AM 05/31/2013   10:13 AM  Advanced Directives  Does Patient Have a Medical Advance Directive? No No No No No No Patient does not have advance directive;Patient would not like information  Would patient like information on creating a medical advance directive? No - Patient declined No - Patient declined No - Patient declined No - Patient declined No - patient declined information No - patient declined information   Pre-existing out of facility DNR order (yellow form or pink MOST form)       No    Current Medications (verified) Outpatient Encounter Medications as of 09/14/2022  Medication Sig   acetaminophen (TYLENOL) 500 MG tablet Take 1,000 mg by mouth daily as needed for mild pain or headache.   aspirin EC 81 MG tablet Take 81 mg by mouth daily.   atenolol (TENORMIN) 50 MG tablet TAKE 1 TABLET BY MOUTH EVERY DAY WITH LUNCH   atorvastatin (LIPITOR) 80 MG tablet Take 1 tablet (80 mg total) by mouth every evening.   calcium carbonate (TUMS - DOSED IN MG ELEMENTAL CALCIUM) 500 MG chewable tablet Chew 1 tablet by mouth daily as  needed for indigestion or heartburn.   Dulaglutide (TRULICITY) [id number] INJECT 0.75MG INTO THE SKIN ONCE WEEKLY   lisinopril (ZESTRIL) 10 MG tablet Take 1 tablet (10 mg total) by mouth daily.   loratadine (CLARITIN) 10 MG tablet Take 10 mg by mouth as needed for allergies.   metFORMIN (GLUCOPHAGE) 1000 MG tablet Take 1 tablet (1,000 mg total) by mouth 2 (two) times daily with a meal.   nitroGLYCERIN (NITROSTAT) 0.4 MG SL tablet PLACE 1 TABLET UNDER THE TONGUE EVERY 5 (FIVE) MINUTES AS NEEDED FOR CHEST PAIN (UP TO 3 DOSES).   tamsulosin (FLOMAX) 0.4 MG CAPS capsule TAKE 1 CAPSULE BY MOUTH EVERY DAY AFTER SUPPER.   XARELTO 2.5 MG TABS tablet TAKE 1 TABLET(2.5 MG) BY MOUTH TWICE DAILY   No facility-administered encounter medications on file as of 09/14/2022.    Allergies (verified) Patient has no known allergies.   History: Past Medical History:  Diagnosis Date   Arthritis    [place]    a. Ascending aorta mildly dilated by echo 09/03/15.   Bladder cancer ([facility] 2012   Mass removed, then BCG by urologist (Dr. BAlinda Money.  Recurrence documented on cysto + bladder washings 10/14/15--getting more maintenance BCG.  Cysto 01/20/16 OK, then more BCG given.  Cysto 04/13/16 c/w just having finished BCG but cytology/FISH ok.  Cysto 07/27/2016 c/w small area of BCG related inflamm, BCG stopped due to severe sx's.  12/2020 cysto benign   Bladder cancer ([place])    Surveillance cysto 11/02/16 cysto showed 1.5 cm raised erythematous lesion->bx benign. Subsequent cystoscopies unchanged, most recent 07/2019 and 04/2021.   BPH (benign prostatic hyperplasia)    with hx of acute prostatitis   CAD (coronary artery disease)    a. '05 MI > DES to circumflex. b. NSTEMI 08/2015 with total occlusion of the mid LAD along with 20% mid-distal Cx, 100% dCX, 40% D1, 50% pCx, 50% m-dRCA, 80% dRCA.Given >12 hours out from symptom onset, medical therapy recommended.   Diabetes mellitus type 2, controlled  ([place])    HbA1c 02/2011 was 6.6%   DVT (deep venous thrombosis) (HCC)    Elevated PSA 09/2011   with prostate nodule.  Prostate bx ALL BENIGN 10/10/11 (Dr. Christella Hartigan followed by Urol--improved to 1.18 Jun 2017.  Nodule NOT palpable anymore on DRE at [facility] 01/2018 office f/u. Stable as of 08/2019. Slight incease 12/2020, urol to rpt 1 yr.   GERD (gastroesophageal reflux disease)    occasional   Headache(784.0)    cluster headaches a long time ago   History of adenomatous polyp of colon 09/23/2020   recall 09/2025   History of bronchitis    History of vertigo    HTN (hypertension)    Hyperlipidemia    Ischemic cardiomyopathy    a. 2D Echo 09/03/15: mild LVH, EF 40-45%, diffuse HK, akinesis of apical myocardium, grade 1 DD, aortic root dimention 79m, ascending aorta mildly dilated, mildly dilated RV/RA.    Myocardial infarction ([facility] 2005; 08/2015   followed by Dr. CStanford Breed  [place]ipheral artery disease) [place] 06/[place]   R LL abnl ABI and doppler->see PSH section of EMR for details.   [place]    Stable 2012-2017   Tobacco abuse    Wolf-Parkinson-White syndrome    Past Surgical History:  Procedure Laterality Date   BLADDER TUMOR EXCISION  11/08/10; 05/2015   urothelial carcinoma (Dr. BAlinda Money.  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   CARDIAC CATHETERIZATION  2005   with stent placement   CARDIAC CATHETERIZATION N/A 09/01/2015   Procedure: Left Heart Cath and Coronary Angiography;  Surgeon: HBelva Crome MD;  [facility];  Service: Cardiovascular;  Laterality: N/A;   COLONOSCOPY  2010; 09/23/20   2010 Dr. MIsa Rankin 09/2020 Dr. JRose Fillers3/2027.   CYSTOSCOPY W/ RETROGRADES Bilateral 06/22/2015   Procedure: CYSTOSCOPY WITH RETROGRADE PYELOGRAM;  Surgeon: LRaynelle Bring MD;  Location: [facility];  Service: Urology;  Laterality: Bilateral;   CYSTOSCOPY W/ RETROGRADES N/A 11/17/2016   Procedure: CYSTOSCOPY WITH  RETROGRADE PYELOGRAM;  Surgeon: LRaynelle Bring MD;  Location: [facility];  Service: Urology;  Laterality: N/A;   CYSTOSCOPY WITH RETROGRADE PYELOGRAM, URETEROSCOPY AND STENT PLACEMENT N/A 06/06/2013   Procedure: CYSTOSCOPY WITH RETROGRADE PYELOGRAM;  Surgeon: LDutch Gray MD;  Location: [facility];  Service: Urology;  Laterality: N/A;   LE ABI vascular studies  06/2019   R LL claudication + evidence of PAD (ABI very low AND popliteal and peroneal artery occlusion on dopler u/s) in R leg (L leg fine)->referred to Dr. AFletcher Anon  Conservative mgmt with walking program and low dose xarelto first, then consider revascularization.   lexiscan  2012   Normal/negative   LMcKinnonin [place])-no trouble since last surgery   myocardial perfusion study  2009   Neg ischemia, EF 52%   TRANSURETHRAL RESECTION OF BLADDER TUMOR N/A  06/06/2013   Procedure: TRANSURETHRAL RESECTION OF BLADDER TUMOR (TURBT);  Surgeon: Dutch Gray, MD;  Location: [facility];  Service: Urology;  Laterality: N/A;.  PATH: squamous metaplasia, fibrosis with chronic inflammation, no malignancy.   TRANSURETHRAL RESECTION OF BLADDER TUMOR N/A 06/22/2015   Carcinoma in situ: no invasive malignancy.  Procedure: TRANSURETHRAL RESECTION OF BLADDER TUMOR (TURBT);  Surgeon: Raynelle Bring, MD;  Location[facility];  Service: Urology;  Laterality: N/A;  **BLADDER BIOPSY*RESECTION**   TRANSURETHRAL RESECTION OF BLADDER TUMOR N/A 11/17/2016   Path: BENIGN UROTHELIUM WITH INFLAMMATION--no malignancy. Procedure: TRANSURETHRAL RESECTION OF BLADDER TUMOR (TURBT);  Surgeon: Raynelle Bring, MD;  Location[facility];  Service: Urology;  Laterality: N/A;   VASECTOMY     Family History  Problem Relation Age of Onset   Hypertension Mother    Cancer Father        colon and prostate, mets to lungs.   Hypertension Father    Heart disease Brother        d. 84 MI   Multiple sclerosis Brother    Diabetes Brother    Heart disease Paternal  Uncle    Colon cancer Neg Hx    Colon polyps Neg Hx    Esophageal cancer Neg Hx    Stomach cancer Neg Hx    Social History   Socioeconomic History   Marital status: Married    Spouse name: Not on file   Number of children: Not on file   Years of education: Not on file   Highest education level: 12th grade  Occupational History   Not on file  Tobacco Use   Smoking status: Former    Packs/day: 0.25    Years: 40.00    Total pack years: 10.00    Types: Cigarettes    Quit date: 09/05/2015    Years since quitting: 7.0   Smokeless tobacco: Never  Vaping Use   Vaping Use: Never used  Substance and Sexual Activity   Alcohol use: No    Alcohol/week: 0.0 standard drinks of alcohol   Drug use: No   Sexual activity: Not on file  Other Topics Concern   Not on file  Social History Narrative   Married, 2 children, 4 grandchildren--[place].   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   Tob: 30 pack-yr hx, quit 08/2011.   No alcohol or drug use.   Active on farm but no formal exercise regimen.   Social Determinants of Health   Financial Resource Strain: Low Risk  (09/14/2022)   Overall Financial Resource Strain (CARDIA)    Difficulty of Paying Living Expenses: Not hard at all  Food Insecurity: No Food Insecurity (09/14/2022)   Hunger Vital Sign    Worried About Running Out of Food in the Last Year: Never true    Ran Out of Food in the Last Year: Never true  Transportation Needs: No Transportation Needs (09/14/2022)   PRAPARE - Hydrologist (Medical): No    Lack of Transportation (Non-Medical): No  Physical Activity: Sufficiently Active (09/14/2022)   Exercise Vital Sign    Days of Exercise per Week: 7 days    Minutes of Exercise per Session: 150+ min  Recent Concern: Physical Activity - Insufficiently Active (07/29/2022)   Exercise Vital Sign     Days of Exercise per Week: 4 days    Minutes of Exercise per  Session: 20 min  Stress: No Stress Concern Present (09/14/2022)   [place]    Feeling of Stress : Not at all  Social Connections: Moderately Integrated (09/14/2022)   Social Connection and Isolation Panel [NHANES]    Frequency of Communication with Friends and Family: More than three times a week    Frequency of Social Gatherings with Friends and Family: More than three times a week    Attends Religious Services: More than 4 times per year    Active Member of [employer] or Organizations: No    Attends Music therapist: Never    Marital Status: Married    Tobacco Counseling Counseling given: Not Answered   Clinical Intake:  Pre-visit preparation completed: Yes  Pain : No/denies pain     BMI - recorded: 27.92 Nutritional Status: BMI 25 -29 Overweight Nutritional Risks: None Diabetes: Yes CBG done?: Yes (104 per pt) CBG resulted in Enter/ Edit results?: No Did pt. bring in CBG monitor from home?: No  How often do you need to have someone help you when you read instructions, pamphlets, or other written materials from your doctor or pharmacy?: 1 - Never  Diabetic?Nutrition Risk Assessment:  Has the patient had any N/V/D within the last 2 months?  Yes  usually after Trulicity meds  Does the patient have any non-healing wounds?  No  Has the patient had any unintentional weight loss or weight gain?  No   Diabetes:  Is the patient diabetic?  Yes  If diabetic, was a CBG obtained today?  Yes  Did the patient bring in their glucometer from home?  No  How often do you monitor your CBG's? Daily .   Financial Strains and Diabetes Management:  Are you having any financial strains with the device, your supplies or your medication? No .  Does the patient want to be seen by Chronic Care Management for management of their diabetes?  No  Would the  patient like to be referred to a Nutritionist or for Diabetic Management?  No   Diabetic Exams:  Diabetic Eye Exam: Overdue for diabetic eye exam. Pt has been advised about the importance in completing this exam. Patient advised to call and schedule an eye exam. Diabetic Foot Exam: Completed 11/12/21   Interpreter Needed?: No  Information entered by :: Charlott Rakes, LPN   Activities of Daily Living    09/14/2022    9:39 AM  In your present state of health, do you have any difficulty performing the following activities:  Hearing? 1  Comment slight loss  Vision? 0  Difficulty concentrating or making decisions? 0  Walking or climbing stairs? 0  Dressing or bathing? 0  Doing errands, shopping? 0  Preparing Food and eating ? N  Using the Toilet? N  In the past six months, have you accidently leaked urine? Y  Comment at times  Do you have problems with loss of bowel control? N  Managing your Medications? N  Managing your Finances? N  Housekeeping or managing your Housekeeping? N    Patient Care Team: Tammi Sou, MD as PCP - General (Family Medicine) Stanford Breed Denice Bors, MD as PCP - Cardiology (Cardiology) Wellington Hampshire, MD as PCP - [facility] Cardiology (Cardiology) Raynelle Bring, MD as Consulting Physician (Urology) Stanford Breed Denice Bors, MD as Consulting Physician (Cardiology) Milus Banister, MD as Consulting Physician (Gastroenterology)  Indicate any recent Medical Services you may have received from other than [facility]  providers in the past year (date may be approximate).     Assessment:   This is a routine wellness examination for [place].  Hearing/Vision screen Hearing Screening - Comments:: Pt has slight hearing loss Vision Screening - Comments:: Pt follows up with provider in [place] for annual eye exams   Dietary issues and exercise activities discussed: Current Exercise Habits: Home exercise routine, Type of exercise: Other - see comments;walking, Time  (Minutes): > 60, Frequency (Times/Week): 7, Weekly Exercise (Minutes/Week): 0   Goals Addressed             This Visit's Progress    Patient Stated       None at this time        Depression Screen    09/14/2022    9:36 AM 09/06/2022    1:22 PM 08/02/2022    9:08 AM 05/02/2022    8:29 AM 11/12/2021    8:14 AM 09/01/2021    2:34 PM 05/17/2021    3:47 PM  PHQ 2/9 Scores  PHQ - 2 Score 0 0 0 0 0 0 0    Fall Risk    09/14/2022    9:38 AM 07/29/2022    3:31 PM 05/02/2022    8:29 AM 11/12/2021    8:14 AM 09/01/2021    2:37 PM  Fall Risk   Falls in the past year? 1 1 0 0 0  Number falls in past yr: 1 0 0 0 0  Injury with Fall? 0 0 0 0 0  Risk for fall due to : Impaired vision  Impaired vision Impaired vision Impaired vision  Follow up Falls prevention discussed  Falls evaluation completed Falls evaluation completed Falls prevention discussed    FALL RISK PREVENTION PERTAINING TO THE HOME:  Any stairs in or around the home? Yes  If so, are there any without handrails? No  Home free of loose throw rugs in walkways, pet beds, electrical cords, etc? Yes  Adequate lighting in your home to reduce risk of falls? Yes   ASSISTIVE DEVICES UTILIZED TO PREVENT FALLS:  Life alert? No  Use of a cane, walker or w/c? Yes  Grab bars in the bathroom? Yes  Shower chair or bench in shower? No  Elevated toilet seat or a handicapped toilet? No   TIMED UP AND GO:  Was the test performed? No .   Cognitive Function:        09/14/2022    9:40 AM 09/01/2021    2:38 PM  6CIT Screen  What Year? 0 points 0 points  What month? 0 points 0 points  What time? 0 points 0 points  Count back from 20 0 points 0 points  Months in reverse 0 points 0 points  Repeat phrase 0 points 0 points  Total Score 0 points 0 points    Immunizations Immunization History  Administered Date(s) Administered   Fluad Quad(high Dose 65+) 04/07/2021, 05/02/2022   Influenza Split 05/07/2012   Influenza,inj,Quad PF,6+  Mos 04/02/2013, 05/09/2014, 05/11/2015, 05/06/2016, 04/06/2017, 03/28/2018, 03/29/2019, 03/25/2020   Moderna Sars-Covid-2 Vaccination 09/27/2019, 10/30/2019, 05/20/2020   PFIZER Comirnaty(Gray Top)Covid-19 Tri-Sucrose Vaccine 02/18/2021   Pfizer Covid-19 Vaccine Bivalent Booster 43yr & up 05/26/2021   Pneumococcal Conjugate-13 10/02/2020   Pneumococcal Polysaccharide-23 08/20/2014   Tdap 03/12/2011, 05/12/2021   Unspecified SARS-COV-2 Vaccination 04/11/2022   Zoster Recombinat (Shingrix) 05/01/2020, 07/22/2020   Zoster, Live 10/09/2015    TDAP status: Up to date  Flu Vaccine status: Up to date  Pneumococcal vaccine status:  Up to date  Covid-19 vaccine status: Completed vaccines  Qualifies for Shingles Vaccine? Yes   Zostavax completed Yes   Shingrix Completed?: Yes  Screening Tests Health Maintenance  Topic Date Due   OPHTHALMOLOGY EXAM  03/07/2022   COVID-19 Vaccine (7 - 2023-24 season) 06/06/2022   Pneumonia Vaccine 22+ Years old (71 of 3 - PPSV23 or PCV20) 11/13/2022 (Originally 10/02/2021)   FOOT EXAM  11/13/2022   HEMOGLOBIN A1C  01/31/2023   Diabetic kidney evaluation - eGFR measurement  05/03/2023   Diabetic kidney evaluation - Urine ACR  05/03/2023   Medicare Annual Wellness (AWV)  09/15/2023   COLONOSCOPY (Pts 45-44yr Insurance coverage will need to be confirmed)  09/24/2023   DTaP/Tdap/Td (3 - Td or Tdap) 05/13/2031   INFLUENZA VACCINE  Completed   Hepatitis C Screening  Completed   Zoster Vaccines- Shingrix  Completed   HPV VACCINES  Aged Out    Health Maintenance  Health Maintenance Due  Topic Date Due   OPHTHALMOLOGY EXAM  03/07/2022   COVID-19 Vaccine (7 - 2023-24 season) 06/06/2022    Colorectal cancer screening: Type of screening: Colonoscopy. Completed 09/23/20. Repeat every 3 years   Additional Screening:  Hepatitis C Screening: Completed 09/06/16  Vision Screening: Recommended annual ophthalmology exams for early detection of glaucoma and  other disorders of the eye. Is the patient up to date with their annual eye exam?  Yes  Who is the provider or what is the name of the office in which the patient attends annual eye exams[facility]  If pt is not established with a provider, would they like to be referred to a provider to establish care? No .   Dental Screening: Recommended annual dental exams for proper oral hygiene  Community Resource Referral / Chronic Care Management: CRR required this visit?  No   CCM required this visit?  No      Plan:     I have personally reviewed and noted the following in the patient's chart:   Medical and social history Use of alcohol, tobacco or illicit drugs  Current medications and supplements including opioid prescriptions. Patient is not currently taking opioid prescriptions. Functional ability and status Nutritional status Physical activity Advanced directives List of other physicians Hospitalizations, surgeries, and ER visits in previous 12 months Vitals Screenings to include cognitive, depression, and falls Referrals and appointments  In addition, I have reviewed and discussed with patient certain preventive protocols, quality metrics, and best practice recommendations. A written personalized care plan for preventive services as well as general preventive health recommendations were provided to patient.     TWillette Brace LPN   [id number]  Nurse Notes: none

## 2022-09-14 NOTE — Patient Instructions (Signed)
Mark Blankenship , Thank you for taking time to come for your Medicare Wellness Visit. I appreciate your ongoing commitment to your health goals. Please review the following plan we discussed and let me know if I can assist you in the future.   These are the goals we discussed:  Goals      Patient Stated     Lose weight      Patient Stated     None at this time         This is a list of the screening recommended for you and due dates:  Health Maintenance  Topic Date Due   Eye exam for diabetics  03/07/2022   COVID-19 Vaccine (7 - 2023-24 season) 06/06/2022   Pneumonia Vaccine (3 of 3 - PPSV23 or PCV20) 11/13/2022*   Complete foot exam   11/13/2022   Hemoglobin A1C  01/31/2023   Yearly kidney function blood test for diabetes  05/03/2023   Yearly kidney health urinalysis for diabetes  05/03/2023   Medicare Annual Wellness Visit  09/15/2023   Colon Cancer Screening  09/24/2023   DTaP/Tdap/Td vaccine (3 - Td or Tdap) 05/13/2031   Flu Shot  Completed   Hepatitis C Screening: USPSTF Recommendation to screen - Ages 18-79 yo.  Completed   Zoster (Shingles) Vaccine  Completed   HPV Vaccine  Aged Out  *Topic was postponed. The date shown is not the original due date.    Advanced directives: Advance directive discussed with you today. Even though you declined this today please call our office should you change your mind and we can give you the proper paperwork for you to fill out.   Conditions/risks identified: none at this time   Next appointment: Follow up in one year for your annual wellness visit.   Preventive Care 68 Years and Older, Male  Preventive care refers to lifestyle choices and visits with your health care provider that can promote health and wellness. What does preventive care include? A yearly physical exam. This is also called an annual well check. Dental exams once or twice a year. Routine eye exams. Ask your health care provider how often you should have your eyes  checked. Personal lifestyle choices, including: Daily care of your teeth and gums. Regular physical activity. Eating a healthy diet. Avoiding tobacco and drug use. Limiting alcohol use. Practicing safe sex. Taking low doses of aspirin every day. Taking vitamin and mineral supplements as recommended by your health care provider. What happens during an annual well check? The services and screenings done by your health care provider during your annual well check will depend on your age, overall health, lifestyle risk factors, and family history of disease. Counseling  Your health care provider may ask you questions about your: Alcohol use. Tobacco use. Drug use. Emotional well-being. Home and relationship well-being. Sexual activity. Eating habits. History of falls. Memory and ability to understand (cognition). Work and work Statistician. Screening  You may have the following tests or measurements: Height, weight, and BMI. Blood pressure. Lipid and cholesterol levels. These may be checked every 5 years, or more frequently if you are over 58 years old. Skin check. Lung cancer screening. You may have this screening every year starting at age 26 if you have a 30-pack-year history of smoking and currently smoke or have quit within the past 15 years. Fecal occult blood test (FOBT) of the stool. You may have this test every year starting at age 47. Flexible sigmoidoscopy or colonoscopy. You may  have a sigmoidoscopy every 5 years or a colonoscopy every 10 years starting at age 48. Prostate cancer screening. Recommendations will vary depending on your family history and other risks. Hepatitis C blood test. Hepatitis B blood test. Sexually transmitted disease (STD) testing. Diabetes screening. This is done by checking your blood sugar (glucose) after you have not eaten for a while (fasting). You may have this done every 1-3 years. Abdominal aortic aneurysm (AAA) screening. You may need this  if you are a current or former smoker. Osteoporosis. You may be screened starting at age 47 if you are at high risk. Talk with your health care provider about your test results, treatment options, and if necessary, the need for more tests. Vaccines  Your health care provider may recommend certain vaccines, such as: Influenza vaccine. This is recommended every year. Tetanus, diphtheria, and acellular pertussis (Tdap, Td) vaccine. You may need a Td booster every 10 years. Zoster vaccine. You may need this after age 90. Pneumococcal 13-valent conjugate (PCV13) vaccine. One dose is recommended after age 48. Pneumococcal polysaccharide (PPSV23) vaccine. One dose is recommended after age 73. Talk to your health care provider about which screenings and vaccines you need and how often you need them. This information is not intended to replace advice given to you by your health care provider. Make sure you discuss any questions you have with your health care provider. Document Released: 08/07/2015 Document Revised: 03/30/2016 Document Reviewed: 05/12/2015 Elsevier Interactive Patient Education  2017 [place] Prevention in the Home Falls can cause injuries. They can happen to people of all ages. There are many things you can do to make your home safe and to help prevent falls. What can I do on the outside of my home? Regularly fix the edges of walkways and driveways and fix any cracks. Remove anything that might make you trip as you walk through a door, such as a raised step or threshold. Trim any bushes or trees on the path to your home. Use bright outdoor lighting. Clear any walking paths of anything that might make someone trip, such as rocks or tools. Regularly check to see if handrails are loose or broken. Make sure that both sides of any steps have handrails. Any raised decks and porches should have guardrails on the edges. Have any leaves, snow, or ice cleared regularly. Use sand  or salt on walking paths during winter. Clean up any spills in your garage right away. This includes oil or grease spills. What can I do in the bathroom? Use night lights. Install grab bars by the toilet and in the tub and shower. Do not use towel bars as grab bars. Use non-skid mats or decals in the tub or shower. If you need to sit down in the shower, use a plastic, non-slip stool. Keep the floor dry. Clean up any water that spills on the floor as soon as it happens. Remove soap buildup in the tub or shower regularly. Attach bath mats securely with double-sided non-slip rug tape. Do not have throw rugs and other things on the floor that can make you trip. What can I do in the bedroom? Use night lights. Make sure that you have a light by your bed that is easy to reach. Do not use any sheets or blankets that are too big for your bed. They should not hang down onto the floor. Have a firm chair that has side arms. You can use this for support while you get dressed.  Do not have throw rugs and other things on the floor that can make you trip. What can I do in the kitchen? Clean up any spills right away. Avoid walking on wet floors. Keep items that you use a lot in easy-to-reach places. If you need to reach something above you, use a strong step stool that has a grab bar. Keep electrical cords out of the way. Do not use floor polish or wax that makes floors slippery. If you must use wax, use non-skid floor wax. Do not have throw rugs and other things on the floor that can make you trip. What can I do with my stairs? Do not leave any items on the stairs. Make sure that there are handrails on both sides of the stairs and use them. Fix handrails that are broken or loose. Make sure that handrails are as long as the stairways. Check any carpeting to make sure that it is firmly attached to the stairs. Fix any carpet that is loose or worn. Avoid having throw rugs at the top or bottom of the stairs.  If you do have throw rugs, attach them to the floor with carpet tape. Make sure that you have a light switch at the top of the stairs and the bottom of the stairs. If you do not have them, ask someone to add them for you. What else can I do to help prevent falls? Wear shoes that: Do not have high heels. Have rubber bottoms. Are comfortable and fit you well. Are closed at the toe. Do not wear sandals. If you use a stepladder: Make sure that it is fully opened. Do not climb a closed stepladder. Make sure that both sides of the stepladder are locked into place. Ask someone to hold it for you, if possible. Clearly mark and make sure that you can see: Any grab bars or handrails. First and last steps. Where the edge of each step is. Use tools that help you move around (mobility aids) if they are needed. These include: Canes. Walkers. Scooters. Crutches. Turn on the lights when you go into a dark area. Replace any light bulbs as soon as they burn out. Set up your furniture so you have a clear path. Avoid moving your furniture around. If any of your floors are uneven, fix them. If there are any pets around you, be aware of where they are. Review your medicines with your doctor. Some medicines can make you feel dizzy. This can increase your chance of falling. Ask your doctor what other things that you can do to help prevent falls. This information is not intended to replace advice given to you by your health care provider. Make sure you discuss any questions you have with your health care provider. Document Released: 05/07/2009 Document Revised: 12/17/2015 Document Reviewed: 08/15/2014 Elsevier Interactive Patient Education  2017 [employer].

## 2022-09-16 ENCOUNTER — Encounter: Payer: Self-pay | Admitting: Cardiology

## 2022-09-16 ENCOUNTER — Ambulatory Visit: Payer: Medicare Other | Attending: Cardiology | Admitting: Cardiology

## 2022-09-16 VITALS — BP 120/60 | HR 60 | Ht 66.0 in | Wt 175.6 lb

## 2022-09-16 DIAGNOSIS — I255 Ischemic cardiomyopathy: Secondary | ICD-10-CM | POA: Diagnosis not present

## 2022-09-16 DIAGNOSIS — I10 Essential (primary) hypertension: Secondary | ICD-10-CM | POA: Diagnosis not present

## 2022-09-16 DIAGNOSIS — I2510 Atherosclerotic heart disease of native coronary artery without angina pectoris: Secondary | ICD-10-CM

## 2022-09-16 DIAGNOSIS — I739 Peripheral vascular disease, unspecified: Secondary | ICD-10-CM

## 2022-09-16 DIAGNOSIS — E785 Hyperlipidemia, unspecified: Secondary | ICD-10-CM

## 2022-09-16 MED ORDER — METOPROLOL SUCCINATE ER 50 MG PO TB24: 50.0000 mg | ORAL_TABLET | Freq: Every day | ORAL | 3 refills | Status: DC

## 2022-09-16 NOTE — Patient Instructions (Signed)
Medication Instructions:   STOP ATENOLOL  START METOPROLOL SUCC ER 50 MG ONCE DAILY AT BEDTIME  CHECK ON THE PRICE OF ENTRESTO  *If you need a refill on your cardiac medications before your next appointment, please call your pharmacy*   Testing/Procedures:  Your physician has requested that you have an echocardiogram. Echocardiography is a painless test that uses sound waves to create images of your heart. It provides your doctor with information about the size and shape of your heart and how well your heart's chambers and valves are working. This procedure takes approximately one hour. There are no restrictions for this procedure. Please do NOT wear cologne, perfume, aftershave, or lotions (deodorant is allowed). Please arrive 15 minutes prior to your appointment time. [facility], you and your health needs are our priority.  As part of our continuing mission to provide you with exceptional heart care, we have created designated Provider Care Teams.  These Care Teams include your primary Cardiologist (physician) and Advanced Practice Providers (APPs -  Physician Assistants and Nurse Practitioners) who all work together to provide you with the care you need, when you need it.  We recommend signing up for the patient portal called "MyChart".  Sign up information is provided on this After Visit Summary.  MyChart is used to connect with patients for Virtual Visits (Telemedicine).  Patients are able to view lab/test results, encounter notes, upcoming appointments, etc.  Non-urgent messages can be sent to your provider as well.   To learn more about what you can do with MyChart, go to [email or web address].    Your next appointment:   12 month(s)  Provider:   Kirk Ruths, MD

## 2022-09-26 DIAGNOSIS — H25813 Combined forms of age-related cataract, bilateral: Secondary | ICD-10-CM | POA: Diagnosis not present

## 2022-10-06 DIAGNOSIS — E119 Type 2 diabetes mellitus without complications: Secondary | ICD-10-CM | POA: Diagnosis not present

## 2022-10-07 ENCOUNTER — Encounter: Payer: Self-pay | Admitting: Cardiology

## 2022-10-10 ENCOUNTER — Other Ambulatory Visit: Payer: Self-pay | Admitting: Family Medicine

## 2022-10-17 ENCOUNTER — Ambulatory Visit (HOSPITAL_COMMUNITY): Payer: Medicare Other | Attending: Cardiology

## 2022-10-17 DIAGNOSIS — I255 Ischemic cardiomyopathy: Secondary | ICD-10-CM | POA: Insufficient documentation

## 2022-10-17 LAB — ECHOCARDIOGRAM COMPLETE
Area-P 1/2: 3.48 cm2
S' Lateral: 2.4 cm

## 2022-10-21 ENCOUNTER — Encounter: Payer: Self-pay | Admitting: Cardiology

## 2022-10-21 DIAGNOSIS — I10 Essential (primary) hypertension: Secondary | ICD-10-CM

## 2022-10-24 ENCOUNTER — Encounter: Payer: Self-pay | Admitting: Cardiology

## 2022-10-24 MED ORDER — LISINOPRIL 20 MG PO TABS: 20.0000 mg | ORAL_TABLET | Freq: Every day | ORAL | 3 refills | Status: DC

## 2022-10-31 ENCOUNTER — Encounter: Payer: Self-pay | Admitting: Family Medicine

## 2022-11-01 ENCOUNTER — Encounter: Payer: BLUE CROSS/BLUE SHIELD | Admitting: Family Medicine

## 2022-11-01 ENCOUNTER — Other Ambulatory Visit: Payer: Self-pay

## 2022-11-01 MED ORDER — TRULICITY 0.75 MG/0.5ML ~~LOC~~ SOAJ: SUBCUTANEOUS | 0 refills | Status: DC

## 2022-11-01 MED ORDER — TAMSULOSIN HCL 0.4 MG PO CAPS: ORAL_CAPSULE | ORAL | 0 refills | Status: DC

## 2022-11-02 ENCOUNTER — Other Ambulatory Visit: Payer: Self-pay | Admitting: Family Medicine

## 2022-11-15 ENCOUNTER — Encounter: Payer: Self-pay | Admitting: Family Medicine

## 2022-11-15 ENCOUNTER — Encounter: Payer: Self-pay | Admitting: Cardiology

## 2022-11-15 ENCOUNTER — Ambulatory Visit (INDEPENDENT_AMBULATORY_CARE_PROVIDER_SITE_OTHER): Payer: Medicare Other | Admitting: Family Medicine

## 2022-11-15 VITALS — BP 127/87 | HR 73 | Ht 67.0 in | Wt 173.2 lb

## 2022-11-15 DIAGNOSIS — E119 Type 2 diabetes mellitus without complications: Secondary | ICD-10-CM | POA: Diagnosis not present

## 2022-11-15 DIAGNOSIS — Z7984 Long term (current) use of oral hypoglycemic drugs: Secondary | ICD-10-CM | POA: Diagnosis not present

## 2022-11-15 DIAGNOSIS — Z0000 Encounter for general adult medical examination without abnormal findings: Secondary | ICD-10-CM | POA: Diagnosis not present

## 2022-11-15 DIAGNOSIS — E7800 Pure hypercholesterolemia, unspecified: Secondary | ICD-10-CM | POA: Diagnosis not present

## 2022-11-15 DIAGNOSIS — I10 Essential (primary) hypertension: Secondary | ICD-10-CM | POA: Diagnosis not present

## 2022-11-15 LAB — LIPID PANEL
Cholesterol: 82 mg/dL (ref 0–200)
HDL: 28.4 mg/dL — ABNORMAL LOW (ref >39.00)
LDL Cholesterol: 37 mg/dL (ref 0–99)
NonHDL: 53.27
Total CHOL/HDL Ratio: 3
Triglycerides: 80 mg/dL (ref 0.0–149.0)
VLDL: 16 mg/dL (ref 0.0–40.0)

## 2022-11-15 LAB — COMPREHENSIVE METABOLIC PANEL
ALT: 18 U/L (ref 0–53)
AST: 17 U/L (ref 0–37)
Albumin: 4.4 g/dL (ref 3.5–5.2)
Alkaline Phosphatase: 75 U/L (ref 39–117)
BUN: 15 mg/dL (ref 6–23)
CO2: 29 mEq/L (ref 19–32)
Calcium: 9.4 mg/dL (ref 8.4–10.5)
Chloride: 101 mEq/L (ref 96–112)
Creatinine, Ser: 1.08 mg/dL (ref 0.40–1.50)
GFR: 71.04 mL/min (ref >60.00)
Glucose, Bld: 160 mg/dL — ABNORMAL HIGH (ref 70–99)
Potassium: 4.5 mEq/L (ref 3.5–5.1)
Sodium: 138 mEq/L (ref 135–145)
Total Bilirubin: 1 mg/dL (ref 0.2–1.2)
Total Protein: 6.6 g/dL (ref 6.0–8.3)

## 2022-11-15 LAB — CBC
HCT: 46.7 % (ref 39.0–52.0)
Hemoglobin: 16.5 g/dL (ref 13.0–17.0)
MCHC: 35.4 g/dL (ref 30.0–36.0)
MCV: 91.2 fl (ref 78.0–100.0)
Platelets: 136 10*3/uL — ABNORMAL LOW (ref 150.0–400.0)
RBC: 5.12 Mil/uL (ref 4.22–5.81)
RDW: 14.1 % (ref 11.5–15.5)
WBC: 8.6 10*3/uL (ref 4.0–10.5)

## 2022-11-15 LAB — POCT GLYCOSYLATED HEMOGLOBIN (HGB A1C)
HbA1c POC (<> result, manual entry): 7.6 % (ref 4.0–5.6)
HbA1c, POC (controlled diabetic range): 7.6 % — AB (ref 0.0–7.0)
HbA1c, POC (prediabetic range): 7.6 % — AB (ref 5.7–6.4)
Hemoglobin A1C: 7.6 % — AB (ref 4.0–5.6)

## 2022-11-15 MED ORDER — TRULICITY 1.5 MG/0.5ML ~~LOC~~ SOAJ: 1.5000 mg | SUBCUTANEOUS | 1 refills | Status: DC

## 2022-11-15 MED ORDER — METFORMIN HCL 1000 MG PO TABS: 1000.0000 mg | ORAL_TABLET | Freq: Two times a day (BID) | ORAL | 1 refills | Status: DC

## 2022-11-15 NOTE — Progress Notes (Signed)
Office Note 11/15/2022  CC:  Chief Complaint  Patient presents with   Annual Exam    Fasting CPE   Patient is a 67 y.o. male who is here for annual health maintenance exam and 48-month follow-up diabetes, hypertension, and hyperlipidemia. A/P as of last visit: "1 diabetes, control worse last 3 months-> hemoglobin A1c rose from 6% to 7.4%. Discussed options today: He wants to work on improving his diet and activity level over the next 3 months.  No medication changes today--> continue Trulicity 0.75 mg weekly and metformin 1000 mg twice daily. Electrolytes and creatinine have shown stability when checked every 3 months, most recently October 2023.  We will repeat these at next follow-up in 3 months.   #2 hyperlipidemia, doing well on a atorvastatin 80 mg a day. LDL was 40 about 3 months ago. Repeat lipid panel in 3 months.   #3 hypertension, well-controlled on atenolol 50 mg a day and lisinopril 10 mg a day.   4.  Bilateral knee osteoarthritis. He has had moderately good response to steroid injection in the knees in the past. We did not do this today. Discussed option of getting reevaluation by orthopedic surgeon (he says he got evaluation back in 2000 and he was told he was too young for TKA at that time). He will let me know if he wants to take this route in the future. Tylenol as needed. Avoid NSAIDs due to being on aspirin and Xarelto.".  INTERIM HX: Mark Blankenship is doing pretty well. His blood pressures have been in the 140s over 90s average.  He and Dr. [name] been working on these. Echo last month showed normal EF, some regional LV hypokinesis. Patient states his lisinopril has recently been increased to 30 mg a day.  Additionally, his atenolol was switched over to Toprol.  Patient states he has been eating out too much.   Past Medical History:  Diagnosis Date   Arthritis    Ascending aorta dilatation    a. Ascending aorta mildly dilated by echo 09/03/15.   Bladder  cancer 2012   Mass removed, then BCG by urologist (Dr. Laverle Patter).  Recurrence documented on cysto + bladder washings 10/14/15--getting more maintenance BCG.  Cysto 01/20/16 OK, then more BCG given.  Cysto 04/13/16 c/w just having finished BCG but cytology/FISH ok.  Cysto 07/27/2016 c/w small area of BCG related inflamm, BCG stopped due to severe sx's. 12/2020 cysto benign   Bladder cancer    Surveillance cysto 11/02/16 cysto showed 1.5 cm raised erythematous lesion->bx benign. Subsequent cystoscopies unchanged, most recent 07/2019 and 04/2021.   BPH (benign prostatic hyperplasia)    with hx of acute prostatitis   CAD (coronary artery disease)    a. '05 MI > DES to circumflex. b. NSTEMI 08/2015 with total occlusion of the mid LAD along with 20% mid-distal Cx, 100% dCX, 40% D1, 50% pCx, 50% m-dRCA, 80% dRCA.Given >12 hours out from symptom onset, medical therapy recommended.   Diabetes mellitus type 2, controlled    HbA1c 02/2011 was 6.6%   DVT (deep venous thrombosis)    Elevated PSA 09/2011   with prostate nodule.  Prostate bx ALL BENIGN 10/10/11 (Dr. Marin Roberts followed by Urol--improved to 1.18 Jun 2017.  Nodule NOT palpable anymore on DRE at [facility] 01/2018 office f/u. Stable as of 08/2019. Slight incease 12/2020, urol to rpt 1 yr.   GERD (gastroesophageal reflux disease)    occasional   Headache(784.0)    cluster headaches a long time ago   History  of adenomatous polyp of colon 09/23/2020   recall 09/2025   History of bronchitis    History of vertigo    HTN (hypertension)    Hyperlipidemia    Ischemic cardiomyopathy    a. 2D Echo 09/03/15: mild LVH, EF 40-45%, diffuse HK, akinesis of apical myocardium, grade 1 DD, aortic root dimention 38mm, ascending aorta mildly dilated, mildly dilated RV/RA.    Myocardial infarction 2005; 08/2015   followed by Dr. [name]   PAD (peripheral artery disease) 06/2019   R LL abnl ABI and doppler->see PSH section of EMR for details.   Thrombocytopenia    Stable 2012-2017    Tobacco abuse    Wolf-Parkinson-White syndrome     Past Surgical History:  Procedure Laterality Date   BLADDER TUMOR EXCISION  11/08/10; 05/2015   urothelial carcinoma (Dr. Laverle Patter).  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   CARDIAC CATHETERIZATION  2005   with stent placement   CARDIAC CATHETERIZATION N/A 09/01/2015   Procedure: Left Heart Cath and Coronary Angiography;  Surgeon: Lyn Records, MD;  Location: [facility] INVASIVE CV LAB;  Service: Cardiovascular;  Laterality: N/A;   COLONOSCOPY  2010; 09/23/20   2010 Dr. Gloriajean Dell. 09/2020 Dr. Hope Budds 09/2025.   CYSTOSCOPY W/ RETROGRADES Bilateral 06/22/2015   Procedure: CYSTOSCOPY WITH RETROGRADE PYELOGRAM;  Surgeon: Heloise Purpura, MD;  Location: [facility];  Service: Urology;  Laterality: Bilateral;   CYSTOSCOPY W/ RETROGRADES N/A 11/17/2016   Procedure: CYSTOSCOPY WITH RETROGRADE PYELOGRAM;  Surgeon: Heloise Purpura, MD;  Location: [facility];  Service: Urology;  Laterality: N/A;   CYSTOSCOPY WITH RETROGRADE PYELOGRAM, URETEROSCOPY AND STENT PLACEMENT N/A 06/06/2013   Procedure: CYSTOSCOPY WITH RETROGRADE PYELOGRAM;  Surgeon: Crecencio Mc, MD;  Location: [facility];  Service: Urology;  Laterality: N/A;   LE ABI vascular studies  06/2019   R LL claudication + evidence of PAD (ABI very low AND popliteal and peroneal artery occlusion on dopler u/s) in R leg (L leg fine)->referred to Dr. Kirke Corin.  Conservative mgmt with walking program and low dose xarelto first, then consider revascularization.   lexiscan  2012   Normal/negative   LUMBAR SPINE SURGERY  1989 & 1991   Neurosurgeon in [place] ([facility])-no trouble since last surgery   myocardial perfusion study  2009   Neg ischemia, EF 52%   TRANSTHORACIC ECHOCARDIOGRAM     09/2022 EF 50-55%, apical hypokinesis, indeterm diast fxn, AV sclerosis w/out stenosis.  Ascending aorta dilation to 38 mm   TRANSURETHRAL RESECTION OF BLADDER TUMOR N/A 06/06/2013    Procedure: TRANSURETHRAL RESECTION OF BLADDER TUMOR (TURBT);  Surgeon: Crecencio Mc, MD;  Location: [facility];  Service: Urology;  Laterality: N/A;.  PATH: squamous metaplasia, fibrosis with chronic inflammation, no malignancy.   TRANSURETHRAL RESECTION OF BLADDER TUMOR N/A 06/22/2015   Carcinoma in situ: no invasive malignancy.  Procedure: TRANSURETHRAL RESECTION OF BLADDER TUMOR (TURBT);  Surgeon: Heloise Purpura, MD;  Location: [facility];  Service: Urology;  Laterality: N/A;  **BLADDER BIOPSY*RESECTION**   TRANSURETHRAL RESECTION OF BLADDER TUMOR N/A 11/17/2016   Path: BENIGN UROTHELIUM WITH INFLAMMATION--no malignancy. Procedure: TRANSURETHRAL RESECTION OF BLADDER TUMOR (TURBT);  Surgeon: Heloise Purpura, MD;  Location: [facility];  Service: Urology;  Laterality: N/A;   VASECTOMY      Family History  Problem Relation Age of Onset   Hypertension Mother    Cancer Father        colon and prostate, mets to lungs.   Hypertension Father    Heart disease Brother  d. 42 MI   Multiple sclerosis Brother    Diabetes Brother    Heart disease Paternal Uncle    Colon cancer Neg Hx    Colon polyps Neg Hx    Esophageal cancer Neg Hx    Stomach cancer Neg Hx     Social History   Socioeconomic History   Marital status: Married    Spouse name: Not on file   Number of children: Not on file   Years of education: Not on file   Highest education level: 12th grade  Occupational History   Not on file  Tobacco Use   Smoking status: Former    Packs/day: 0.25    Years: 40.00    Additional pack years: 0.00    Total pack years: 10.00    Types: Cigarettes    Quit date: 09/05/2015    Years since quitting: 7.2   Smokeless tobacco: Never  Vaping Use   Vaping Use: Never used  Substance and Sexual Activity   Alcohol use: No    Alcohol/week: 0.0 standard drinks of alcohol   Drug use: No   Sexual activity: Not on file  Other Topics Concern   Not on file  Social History Narrative   Married, 2 children, 4  grandchildren--[place].   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   Tob: 30 pack-yr hx, quit 08/2011.   No alcohol or drug use.   Active on farm but no formal exercise regimen.   Social Determinants of Health   Financial Resource Strain: Low Risk  (09/14/2022)   Overall Financial Resource Strain (CARDIA)    Difficulty of Paying Living Expenses: Not hard at all  Food Insecurity: No Food Insecurity (09/14/2022)   Hunger Vital Sign    Worried About Running Out of Food in the Last Year: Never true    Ran Out of Food in the Last Year: Never true  Transportation Needs: No Transportation Needs (09/14/2022)   PRAPARE - Administrator, Civil Service (Medical): No    Lack of Transportation (Non-Medical): No  Physical Activity: Sufficiently Active (09/14/2022)   Exercise Vital Sign    Days of Exercise per Week: 7 days    Minutes of Exercise per Session: 150+ min  Recent Concern: Physical Activity - Insufficiently Active (07/29/2022)   Exercise Vital Sign    Days of Exercise per Week: 4 days    Minutes of Exercise per Session: 20 min  Stress: No Stress Concern Present (09/14/2022)   [employer] of Occupational Health - Occupational Stress Questionnaire    Feeling of Stress : Not at all  Social Connections: Moderately Integrated (09/14/2022)   Social Connection and Isolation Panel [NHANES]    Frequency of Communication with Friends and Family: More than three times a week    Frequency of Social Gatherings with Friends and Family: More than three times a week    Attends Religious Services: More than 4 times per year    Active Member of [employer] or Organizations: No    Attends Banker Meetings: Never    Marital Status: Married  Catering manager Violence: Not At Risk (09/14/2022)   Humiliation, Afraid, Rape, and Kick questionnaire    Fear of Current or Ex-Partner:  No    Emotionally Abused: No    Physically Abused: No    Sexually Abused: No    Outpatient Medications  Prior to Visit  Medication Sig Dispense Refill   acetaminophen (TYLENOL) 500 MG tablet Take 1,000 mg by mouth daily as needed for mild pain or headache.     aspirin EC 81 MG tablet Take 81 mg by mouth daily.     atorvastatin (LIPITOR) 80 MG tablet Take 1 tablet (80 mg total) by mouth every evening. 90 tablet 3   calcium carbonate (TUMS - DOSED IN MG ELEMENTAL CALCIUM) 500 MG chewable tablet Chew 1 tablet by mouth daily as needed for indigestion or heartburn.     lisinopril (ZESTRIL) 20 MG tablet Take 1 tablet (20 mg total) by mouth daily. 90 tablet 3   loratadine (CLARITIN) 10 MG tablet Take 10 mg by mouth as needed for allergies.     metoprolol succinate (TOPROL-XL) 50 MG 24 hr tablet Take 1 tablet (50 mg total) by mouth daily. Take with or immediately following a meal. 90 tablet 3   nitroGLYCERIN (NITROSTAT) 0.4 MG SL tablet PLACE 1 TABLET UNDER THE TONGUE EVERY 5 (FIVE) MINUTES AS NEEDED FOR CHEST PAIN (UP TO 3 DOSES). 25 tablet 3   tamsulosin (FLOMAX) 0.4 MG CAPS capsule TAKE 1 CAPSULE BY MOUTH EVERY DAY AFTER SUPPER 30 capsule 0   XARELTO 2.5 MG TABS tablet TAKE 1 TABLET(2.5 MG) BY MOUTH TWICE DAILY 180 tablet 1   Dulaglutide (TRULICITY) 0.75 MG/0.5ML SOPN INJECT 0.75MG  INTO THE SKIN ONCE WEEKLY 2 mL 0   metFORMIN (GLUCOPHAGE) 1000 MG tablet Take 1 tablet (1,000 mg total) by mouth 2 (two) times daily with a meal. 180 tablet 1   No facility-administered medications prior to visit.    No Known Allergies  Review of Systems  Constitutional:  Negative for appetite change, chills, fatigue and fever.  HENT:  Negative for congestion, dental problem, ear pain and sore throat.   Eyes:  Negative for discharge, redness and visual disturbance.  Respiratory:  Negative for cough, chest tightness, shortness of breath and wheezing.   Cardiovascular:  Negative for chest pain, palpitations and leg  swelling.  Gastrointestinal:  Negative for abdominal pain, blood in stool, diarrhea, nausea and vomiting.  Genitourinary:  Negative for difficulty urinating, dysuria, flank pain, frequency, hematuria and urgency.  Musculoskeletal:  Negative for arthralgias, back pain, joint swelling, myalgias and neck stiffness.  Skin:  Negative for pallor and rash.  Neurological:  Negative for dizziness, speech difficulty, weakness and headaches.  Hematological:  Negative for adenopathy. Does not bruise/bleed easily.  Psychiatric/Behavioral:  Negative for confusion and sleep disturbance. The patient is not nervous/anxious.     PE;    11/15/2022   10:05 AM 09/16/2022    8:52 AM 09/14/2022    9:33 AM  Vitals with BMI  Height     Weight 173 lbs 3 oz 175 lbs 10 oz 173 lbs  BMI 27.12 28.36   Systolic 127 120   Diastolic 87 60   Pulse 73 60      Gen: Alert, well appearing.  Patient is oriented to person, place, time, and situation. AFFECT: pleasant, lucid thought and speech. ENT: Ears: EACs clear, normal epithelium.  TMs with good light reflex and landmarks bilaterally.  Eyes: no injection, icteris, swelling, or exudate.  EOMI, PERRLA. Nose: no drainage or turbinate edema/swelling.  No injection or focal lesion.  Mouth: lips without lesion/swelling.  Oral mucosa pink and moist.  Dentition intact and without obvious caries or gingival swelling.  Oropharynx without erythema, exudate, or swelling.  Neck: supple/nontender.  No LAD, mass, or  TM.  Carotid pulses 2+ bilaterally, without bruits. CV: RRR, no m/r/g.   LUNGS: CTA bilat, nonlabored resps, good aeration in all lung fields. ABD: soft, NT, ND, BS normal.  No hepatospenomegaly or mass.  No bruits. EXT: no clubbing, cyanosis, or edema.  Musculoskeletal: no joint swelling, erythema, warmth, or tenderness.  ROM of all joints intact. Skin - no sores or suspicious lesions or rashes or color changes Foot exam -  no swelling, tenderness or skin or  vascular lesions. Color and temperature is normal. Sensation is intact. Peripheral pulses are palpable. Toenails are normal.  Pertinent labs:  Lab Results  Component Value Date   TSH 0.96 08/26/2015   Lab Results  Component Value Date   WBC 6.9 11/12/2021   HGB 15.7 11/12/2021   HCT 45.1 11/12/2021   MCV 91.4 11/12/2021   PLT 126.0 (L) 11/12/2021   Lab Results  Component Value Date   CREATININE 1.06 05/02/2022   BUN 13 05/02/2022   NA 139 05/02/2022   K 4.6 05/02/2022   CL 104 05/02/2022   CO2 27 05/02/2022   Lab Results  Component Value Date   ALT 16 05/02/2022   AST 18 05/02/2022   ALKPHOS 63 05/02/2022   BILITOT 1.0 05/02/2022   Lab Results  Component Value Date   CHOL 86 05/02/2022   Lab Results  Component Value Date   HDL 30.10 (L) 05/02/2022   Lab Results  Component Value Date   LDLCALC 40 05/02/2022   Lab Results  Component Value Date   TRIG 79.0 05/02/2022   Lab Results  Component Value Date   CHOLHDL 3 05/02/2022   Lab Results  Component Value Date   PSA 1.02 12/17/2021   PSA 3.82 01/08/2021   PSA 2.48 08/23/2019   Lab Results  Component Value Date   HGBA1C 7.6 (A) 11/15/2022   HGBA1C 7.6 11/15/2022   HGBA1C 7.6 (A) 11/15/2022   HGBA1C 7.6 (A) 11/15/2022   ASSESSMENT AND PLAN:   #1 health maintenance exam: Reviewed age and gender appropriate health maintenance issues (prudent diet, regular exercise, health risks of tobacco and excessive alcohol, use of seatbelts, fire alarms in home, use of sunscreen).  Also reviewed age and gender appropriate health screening as well as vaccine recommendations. Vaccines: ALL UTD. Labs: fasting HP, Hba1c, urine microalb/cr. Prostate ca screening: followed by urol for elev PSA. Colon ca screening: History of multiple polyps, recall march 2025. Lung cancer screening: Has 30-pack-year history tobacco smoking, quit approximately 10 years ago-->he defers for now.  #2 diabetes without complication, poor  control. POC Hba1c 7.6% today. Increase Trulicity to 1.5 mg subcu weekly. Continue metformin 1000 mg twice a day. Electrolytes and renal function today.  3.  Hypertension, poor control. Patient is working with his cardiologist on this problem. Will forward our lab results to Dr. [name] today.  4.  Hypercholesterolemia. LDL goal less than 70.  Last LDL was 40 about 6 months ago. Lipid panel and hepatic panel today.  An After Visit Summary was printed and given to the patient.  FOLLOW UP:  Return in about 3 months (around 02/14/2023) for routine chronic illness f/u.  Signed:  Santiago Bumpers, MD           11/15/2022

## 2022-11-15 NOTE — Patient Instructions (Signed)
Health Maintenance, Male Adopting a healthy lifestyle and getting preventive care are important in promoting health and wellness. Ask your health care provider about: The right schedule for you to have regular tests and exams. Things you can do on your own to prevent diseases and keep yourself healthy. What should I know about diet, weight, and exercise? Eat a healthy diet  Eat a diet that includes plenty of vegetables, fruits, low-fat dairy products, and lean protein. Do not eat a lot of foods that are high in solid fats, added sugars, or sodium. Maintain a healthy weight Body mass index (BMI) is a measurement that can be used to identify possible weight problems. It estimates body fat based on height and weight. Your health care provider can help determine your BMI and help you achieve or maintain a healthy weight. Get regular exercise Get regular exercise. This is one of the most important things you can do for your health. Most adults should: Exercise for at least 150 minutes each week. The exercise should increase your heart rate and make you sweat (moderate-intensity exercise). Do strengthening exercises at least twice a week. This is in addition to the moderate-intensity exercise. Spend less time sitting. Even light physical activity can be beneficial. Watch cholesterol and blood lipids Have your blood tested for lipids and cholesterol at 68 years of age, then have this test every 5 years. You may need to have your cholesterol levels checked more often if: Your lipid or cholesterol levels are high. You are older than 68 years of age. You are at high risk for heart disease. What should I know about cancer screening? Many types of cancers can be detected early and may often be prevented. Depending on your health history and family history, you may need to have cancer screening at various ages. This may include screening for: Colorectal cancer. Prostate cancer. Skin cancer. Lung  cancer. What should I know about heart disease, diabetes, and high blood pressure? Blood pressure and heart disease High blood pressure causes heart disease and increases the risk of stroke. This is more likely to develop in people who have high blood pressure readings or are overweight. Talk with your health care provider about your target blood pressure readings. Have your blood pressure checked: Every 3-5 years if you are 18-39 years of age. Every year if you are 40 years old or older. If you are between the ages of 65 and 75 and are a current or former smoker, ask your health care provider if you should have a one-time screening for abdominal aortic aneurysm (AAA). Diabetes Have regular diabetes screenings. This checks your fasting blood sugar level. Have the screening done: Once every three years after age 45 if you are at a normal weight and have a low risk for diabetes. More often and at a younger age if you are overweight or have a high risk for diabetes. What should I know about preventing infection? Hepatitis B If you have a higher risk for hepatitis B, you should be screened for this virus. Talk with your health care provider to find out if you are at risk for hepatitis B infection. Hepatitis C Blood testing is recommended for: Everyone born from 1945 through 1965. Anyone with known risk factors for hepatitis C. Sexually transmitted infections (STIs) You should be screened each year for STIs, including gonorrhea and chlamydia, if: You are sexually active and are younger than 68 years of age. You are older than 68 years of age and your   health care provider tells you that you are at risk for this type of infection. Your sexual activity has changed since you were last screened, and you are at increased risk for chlamydia or gonorrhea. Ask your health care provider if you are at risk. Ask your health care provider about whether you are at high risk for HIV. Your health care provider  may recommend a prescription medicine to help prevent HIV infection. If you choose to take medicine to prevent HIV, you should first get tested for HIV. You should then be tested every 3 months for as long as you are taking the medicine. Follow these instructions at home: Alcohol use Do not drink alcohol if your health care provider tells you not to drink. If you drink alcohol: Limit how much you have to 0-2 drinks a day. Know how much alcohol is in your drink. In the [place] one drink equals one 12 oz bottle of beer (355 mL), one 5 oz glass of wine (148 mL), or one 1 oz glass of hard liquor (44 mL). Lifestyle Do not use any products that contain nicotine or tobacco. These products include cigarettes, chewing tobacco, and vaping devices, such as e-cigarettes. If you need help quitting, ask your health care provider. Do not use street drugs. Do not share needles. Ask your health care provider for help if you need support or information about quitting drugs. General instructions Schedule regular health, dental, and eye exams. Stay current with your vaccines. Tell your health care provider if: You often feel depressed. You have ever been abused or do not feel safe at home. Summary Adopting a healthy lifestyle and getting preventive care are important in promoting health and wellness. Follow your health care provider's instructions about healthy diet, exercising, and getting tested or screened for diseases. Follow your health care provider's instructions on monitoring your cholesterol and blood pressure. This information is not intended to replace advice given to you by your health care provider. Make sure you discuss any questions you have with your health care provider. Document Revised: 11/30/2020 Document Reviewed: 11/30/2020 Elsevier Patient Education  2023 [employer]  

## 2022-11-16 MED ORDER — LISINOPRIL 40 MG PO TABS: 40.0000 mg | ORAL_TABLET | Freq: Every day | ORAL | 3 refills | Status: DC

## 2022-12-04 ENCOUNTER — Other Ambulatory Visit: Payer: Self-pay | Admitting: Cardiology

## 2022-12-06 DIAGNOSIS — H2513 Age-related nuclear cataract, bilateral: Secondary | ICD-10-CM | POA: Diagnosis not present

## 2022-12-06 DIAGNOSIS — H18413 Arcus senilis, bilateral: Secondary | ICD-10-CM | POA: Diagnosis not present

## 2022-12-06 DIAGNOSIS — H2512 Age-related nuclear cataract, left eye: Secondary | ICD-10-CM | POA: Diagnosis not present

## 2022-12-06 DIAGNOSIS — H25013 Cortical age-related cataract, bilateral: Secondary | ICD-10-CM | POA: Diagnosis not present

## 2022-12-06 DIAGNOSIS — H25043 Posterior subcapsular polar age-related cataract, bilateral: Secondary | ICD-10-CM | POA: Diagnosis not present

## 2022-12-21 DIAGNOSIS — R972 Elevated prostate specific antigen [PSA]: Secondary | ICD-10-CM | POA: Diagnosis not present

## 2022-12-27 DIAGNOSIS — Z8551 Personal history of malignant neoplasm of bladder: Secondary | ICD-10-CM | POA: Diagnosis not present

## 2022-12-27 DIAGNOSIS — N401 Enlarged prostate with lower urinary tract symptoms: Secondary | ICD-10-CM | POA: Diagnosis not present

## 2022-12-27 DIAGNOSIS — R8289 Other abnormal findings on cytological and histological examination of urine: Secondary | ICD-10-CM | POA: Diagnosis not present

## 2022-12-27 DIAGNOSIS — R300 Dysuria: Secondary | ICD-10-CM | POA: Diagnosis not present

## 2023-01-04 ENCOUNTER — Other Ambulatory Visit: Payer: Self-pay | Admitting: Family Medicine

## 2023-01-31 DIAGNOSIS — R300 Dysuria: Secondary | ICD-10-CM | POA: Diagnosis not present

## 2023-01-31 DIAGNOSIS — R8271 Bacteriuria: Secondary | ICD-10-CM | POA: Diagnosis not present

## 2023-01-31 DIAGNOSIS — N410 Acute prostatitis: Secondary | ICD-10-CM | POA: Diagnosis not present

## 2023-02-10 NOTE — Patient Instructions (Signed)

## 2023-02-14 ENCOUNTER — Encounter: Payer: Self-pay | Admitting: Family Medicine

## 2023-02-14 ENCOUNTER — Ambulatory Visit (INDEPENDENT_AMBULATORY_CARE_PROVIDER_SITE_OTHER): Payer: Medicare Other | Admitting: Family Medicine

## 2023-02-14 VITALS — BP 115/75 | HR 93 | Wt 167.6 lb

## 2023-02-14 DIAGNOSIS — Z7984 Long term (current) use of oral hypoglycemic drugs: Secondary | ICD-10-CM

## 2023-02-14 DIAGNOSIS — E119 Type 2 diabetes mellitus without complications: Secondary | ICD-10-CM | POA: Diagnosis not present

## 2023-02-14 DIAGNOSIS — Z7985 Long-term (current) use of injectable non-insulin antidiabetic drugs: Secondary | ICD-10-CM

## 2023-02-14 DIAGNOSIS — R2231 Localized swelling, mass and lump, right upper limb: Secondary | ICD-10-CM

## 2023-02-14 DIAGNOSIS — I10 Essential (primary) hypertension: Secondary | ICD-10-CM

## 2023-02-14 DIAGNOSIS — E7800 Pure hypercholesterolemia, unspecified: Secondary | ICD-10-CM

## 2023-02-14 LAB — POCT GLYCOSYLATED HEMOGLOBIN (HGB A1C)
HbA1c POC (<> result, manual entry): 7.6 % (ref 4.0–5.6)
HbA1c, POC (controlled diabetic range): 7.6 % — AB (ref 0.0–7.0)
HbA1c, POC (prediabetic range): 7.6 % — AB (ref 5.7–6.4)
Hemoglobin A1C: 7.6 % — AB (ref 4.0–5.6)

## 2023-02-14 LAB — BASIC METABOLIC PANEL
BUN: 15 mg/dL (ref 6–23)
CO2: 24 mEq/L (ref 19–32)
Calcium: 9.2 mg/dL (ref 8.4–10.5)
Chloride: 104 mEq/L (ref 96–112)
Creatinine, Ser: 1.1 mg/dL (ref 0.40–1.50)
GFR: 69.37 mL/min (ref >60.00)
Glucose, Bld: 176 mg/dL — ABNORMAL HIGH (ref 70–99)
Potassium: 4.3 mEq/L (ref 3.5–5.1)
Sodium: 136 mEq/L (ref 135–145)

## 2023-02-14 NOTE — Progress Notes (Signed)
OFFICE VISIT  02/14/2023  CC:  Chief Complaint  Patient presents with   Medical Management of Chronic Issues    Patient is a 68 y.o. male who presents for 51-month follow-up diabetes, hypertension, and hyperlipidemia. A/P as of last visit: "1 diabetes without complication, poor control. POC Hba1c 7.6% today. Increase Trulicity to 1.5 mg subcu weekly. Continue metformin 1000 mg twice a day. Electrolytes and renal function today.   2.  Hypertension, poor control. Patient is working with his cardiologist on this problem. Will forward our lab results to Dr. [name] today.   3.  Hypercholesterolemia. LDL goal less than 70.  Last LDL was 40 about 6 months ago. Lipid panel and hepatic panel today."  INTERIM HX: Feeling fine. R forearm crusty/dry pink papule for last 1 mo or so. No pain or itching.  ROS as above, plus--> no fevers, no CP, no SOB, no wheezing, no cough, no dizziness, no HAs, no rashes, no melena/hematochezia.  No polyuria or polydipsia.  No myalgias or arthralgias.  No focal weakness, paresthesias, or tremors.  No acute vision or hearing abnormalities.  No dysuria or unusual/new urinary urgency or frequency.  No recent changes in lower legs. No n/v/d or abd pain.  No palpitations.     Past Medical History:  Diagnosis Date   Arthritis    Ascending aorta dilatation (HCC)    a. Ascending aorta mildly dilated by echo 09/03/15.   Bladder cancer [facility]) 2012   Mass removed, then BCG by urologist (Dr. Laverle Patter).  Recurrence documented on cysto + bladder washings 10/14/15--getting more maintenance BCG.  Cysto 01/20/16 OK, then more BCG given.  Cysto 04/13/16 c/w just having finished BCG but cytology/FISH ok.  Cysto 07/27/2016 c/w small area of BCG related inflamm, BCG stopped due to severe sx's. 12/2020 cysto benign   Bladder cancer (HCC)    Surveillance cysto 11/02/16 cysto showed 1.5 cm raised erythematous lesion->bx benign. Subsequent cystoscopies unchanged, most recent 07/2019 and  04/2021.   BPH (benign prostatic hyperplasia)    with hx of acute prostatitis   CAD (coronary artery disease)    a. '05 MI > DES to circumflex. b. NSTEMI 08/2015 with total occlusion of the mid LAD along with 20% mid-distal Cx, 100% dCX, 40% D1, 50% pCx, 50% m-dRCA, 80% dRCA.Given >12 hours out from symptom onset, medical therapy recommended.   Diabetes mellitus type 2, controlled (HCC)    HbA1c 02/2011 was 6.6%   DVT (deep venous thrombosis) (HCC)    Elevated PSA 09/2011   with prostate nodule.  Prostate bx ALL BENIGN 10/10/11 (Dr. Marin Roberts followed by Urol--improved to 1.18 Jun 2017.  Nodule NOT palpable anymore on DRE at [facility] 01/2018 office f/u. Stable as of 08/2019. Slight incease 12/2020, urol to rpt 1 yr.   GERD (gastroesophageal reflux disease)    occasional   Headache(784.0)    cluster headaches a long time ago   History of adenomatous polyp of colon 09/23/2020   recall 09/2025   History of bronchitis    History of vertigo    HTN (hypertension)    Hyperlipidemia    Ischemic cardiomyopathy    a. 2D Echo 09/03/15: mild LVH, EF 40-45%, diffuse HK, akinesis of apical myocardium, grade 1 DD, aortic root dimention 38mm, ascending aorta mildly dilated, mildly dilated RV/RA.    Myocardial infarction [facility]) 2005; 08/2015   followed by Dr. [name]   PAD (peripheral artery disease) (HCC) 06/2019   R LL abnl ABI and doppler->see PSH section of EMR for  details.   Thrombocytopenia (HCC)    Stable 2012-2017   Tobacco abuse    Wolf-Parkinson-White syndrome     Past Surgical History:  Procedure Laterality Date   BLADDER TUMOR EXCISION  11/08/10; 05/2015   urothelial carcinoma (Dr. Laverle Patter).  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   CARDIAC CATHETERIZATION  2005   with stent placement   CARDIAC CATHETERIZATION N/A 09/01/2015   Procedure: Left Heart Cath and Coronary Angiography;  Surgeon: Lyn Records, MD;  Location: [facility] INVASIVE CV LAB;  Service:  Cardiovascular;  Laterality: N/A;   COLONOSCOPY  2010; 09/23/20   2010 Dr. Gloriajean Dell. 09/2020 Dr. Hope Budds 09/2025.   CYSTOSCOPY W/ RETROGRADES Bilateral 06/22/2015   Procedure: CYSTOSCOPY WITH RETROGRADE PYELOGRAM;  Surgeon: Heloise Purpura, MD;  Location: [facility];  Service: Urology;  Laterality: Bilateral;   CYSTOSCOPY W/ RETROGRADES N/A 11/17/2016   Procedure: CYSTOSCOPY WITH RETROGRADE PYELOGRAM;  Surgeon: Heloise Purpura, MD;  Location: [facility];  Service: Urology;  Laterality: N/A;   CYSTOSCOPY WITH RETROGRADE PYELOGRAM, URETEROSCOPY AND STENT PLACEMENT N/A 06/06/2013   Procedure: CYSTOSCOPY WITH RETROGRADE PYELOGRAM;  Surgeon: Crecencio Mc, MD;  Location: [facility];  Service: Urology;  Laterality: N/A;   LE ABI vascular studies  06/2019   R LL claudication + evidence of PAD (ABI very low AND popliteal and peroneal artery occlusion on dopler u/s) in R leg (L leg fine)->referred to Dr. Kirke Corin.  Conservative mgmt with walking program and low dose xarelto first, then consider revascularization.   lexiscan  2012   Normal/negative   LUMBAR SPINE SURGERY  1989 & 1991   Neurosurgeon in [place] ([facility])-no trouble since last surgery   myocardial perfusion study  2009   Neg ischemia, EF 52%   TRANSTHORACIC ECHOCARDIOGRAM     09/2022 EF 50-55%, apical hypokinesis, indeterm diast fxn, AV sclerosis w/out stenosis.  Ascending aorta dilation to 38 mm   TRANSURETHRAL RESECTION OF BLADDER TUMOR N/A 06/06/2013   Procedure: TRANSURETHRAL RESECTION OF BLADDER TUMOR (TURBT);  Surgeon: Crecencio Mc, MD;  Location[facility];  Service: Urology;  Laterality: N/A;.  PATH: squamous metaplasia, fibrosis with chronic inflammation, no malignancy.   TRANSURETHRAL RESECTION OF BLADDER TUMOR N/A 06/22/2015   Carcinoma in situ: no invasive malignancy.  Procedure: TRANSURETHRAL RESECTION OF BLADDER TUMOR (TURBT);  Surgeon: Heloise Purpura, MD;  Location[facility];  Service: Urology;  Laterality: N/A;  **BLADDER BIOPSY*RESECTION**    TRANSURETHRAL RESECTION OF BLADDER TUMOR N/A 11/17/2016   Path: BENIGN UROTHELIUM WITH INFLAMMATION--no malignancy. Procedure: TRANSURETHRAL RESECTION OF BLADDER TUMOR (TURBT);  Surgeon: Heloise Purpura, MD;  [facility];  Service: Urology;  Laterality: N/A;   VASECTOMY      Outpatient Medications Prior to Visit  Medication Sig Dispense Refill   acetaminophen (TYLENOL) 500 MG tablet Take 1,000 mg by mouth daily as needed for mild pain or headache.     aspirin EC 81 MG tablet Take 81 mg by mouth daily.     atorvastatin (LIPITOR) 80 MG tablet TAKE 1 TABLET(80 MG) BY MOUTH EVERY EVENING 90 tablet 3   calcium carbonate (TUMS - DOSED IN MG ELEMENTAL CALCIUM) 500 MG chewable tablet Chew 1 tablet by mouth daily as needed for indigestion or heartburn.     Dulaglutide (TRULICITY) 1.5 MG/0.5ML SOPN Inject 1.5 mg into the skin once a week. 6 mL 1   lisinopril (ZESTRIL) 40 MG tablet Take 1 tablet (40 mg total) by mouth daily. 90 tablet 3   loratadine (CLARITIN) 10 MG tablet  Take 10 mg by mouth as needed for allergies.     metFORMIN (GLUCOPHAGE) 1000 MG tablet Take 1 tablet (1,000 mg total) by mouth 2 (two) times daily with a meal. 180 tablet 1   metoprolol succinate (TOPROL-XL) 50 MG 24 hr tablet Take 1 tablet (50 mg total) by mouth daily. Take with or immediately following a meal. 90 tablet 3   nitroGLYCERIN (NITROSTAT) 0.4 MG SL tablet PLACE 1 TABLET UNDER THE TONGUE EVERY 5 (FIVE) MINUTES AS NEEDED FOR CHEST PAIN (UP TO 3 DOSES). 25 tablet 3   tamsulosin (FLOMAX) 0.4 MG CAPS capsule TAKE 1 CAPSULE BY MOUTH EVERY DAY AFTER SUPPER 90 capsule 0   XARELTO 2.5 MG TABS tablet TAKE 1 TABLET(2.5 MG) BY MOUTH TWICE DAILY 180 tablet 1   No facility-administered medications prior to visit.    No Known Allergies  Review of Systems As per HPI  PE:    02/14/2023    8:11 AM 11/15/2022   10:05 AM 09/16/2022    8:52 AM  Vitals with BMI  Height  5\' 7"  5\' 6"   Weight 167 lbs 10 oz 173 lbs 3 oz 175 lbs 10 oz   BMI  27.12 28.36  Systolic 115 127 423  Diastolic 75 87 60  Pulse 93 73 60     Physical Exam  Gen: Alert, well appearing.  Patient is oriented to person, place, time, and situation. AFFECT: pleasant, lucid thought and speech. R forearm with 3-4 mm pink nodular lesion with some scant flaking of epithelium. No further exam today.  LABS:  Last CBC Lab Results  Component Value Date   WBC 8.6 11/15/2022   HGB 16.5 11/15/2022   HCT 46.7 11/15/2022   MCV 91.2 11/15/2022   MCH 30.6 11/08/2016   RDW 14.1 11/15/2022   PLT 136.0 (L) 11/15/2022   Last metabolic panel Lab Results  Component Value Date   GLUCOSE 160 (H) 11/15/2022   NA 138 11/15/2022   K 4.5 11/15/2022   CL 101 11/15/2022   CO2 29 11/15/2022   BUN 15 11/15/2022   CREATININE 1.08 11/15/2022   GFR 71.04 11/15/2022   CALCIUM 9.4 11/15/2022   PROT 6.6 11/15/2022   ALBUMIN 4.4 11/15/2022   BILITOT 1.0 11/15/2022   ALKPHOS 75 11/15/2022   AST 17 11/15/2022   ALT 18 11/15/2022   ANIONGAP 9 11/08/2016   Last lipids Lab Results  Component Value Date   CHOL 82 11/15/2022   HDL 28.40 (L) 11/15/2022   LDLCALC 37 11/15/2022   LDLDIRECT 87.2 09/03/2010   TRIG 80.0 11/15/2022   CHOLHDL 3 11/15/2022   Last hemoglobin A1c Lab Results  Component Value Date   HGBA1C 7.6 (A) 11/15/2022   HGBA1C 7.6 11/15/2022   HGBA1C 7.6 (A) 11/15/2022   HGBA1C 7.6 (A) 11/15/2022   Last thyroid functions Lab Results  Component Value Date   TSH 0.96 08/26/2015   IMPRESSION AND PLAN:  #1 diabetes without complication, poor control. POC Hba1c 7.2% today--> improving. Continue trulicity 1.5 mg subcu weekly. Continue metformin 1000 mg twice a day. Electrolytes and renal function today.   2.  Hypertension, doing well on lisinopril 40 mg a day and Toprol-XL 50 mg a day. Electrolytes and creatinine today.  #3 hypercholesterolemia. LDL goal less than 70.  Last LDL was 37 about 3 months ago. Continue atorvastatin 80 mg  daily. Check lipids in 3 months.  #4 right arm pain nodule. Possible basal cell versus squamous cell carcinoma. Discussed return appointment for  shave excision and send to pathology versus referral to dermatology.  He wants to think about it at this time.  An After Visit Summary was printed and given to the patient.  FOLLOW UP: Return in about 3 months (around 05/17/2023) for routine chronic illness f/u. Next cpe 10/2023 Signed:  Santiago Bumpers, MD           02/14/2023

## 2023-02-23 DIAGNOSIS — H25812 Combined forms of age-related cataract, left eye: Secondary | ICD-10-CM | POA: Diagnosis not present

## 2023-02-24 DIAGNOSIS — H2512 Age-related nuclear cataract, left eye: Secondary | ICD-10-CM | POA: Diagnosis not present

## 2023-02-24 DIAGNOSIS — H2511 Age-related nuclear cataract, right eye: Secondary | ICD-10-CM | POA: Diagnosis not present

## 2023-03-08 ENCOUNTER — Encounter: Payer: Self-pay | Admitting: Cardiovascular Disease

## 2023-03-08 DIAGNOSIS — I2510 Atherosclerotic heart disease of native coronary artery without angina pectoris: Secondary | ICD-10-CM

## 2023-03-08 MED ORDER — XARELTO 2.5 MG PO TABS
2.5000 mg | ORAL_TABLET | Freq: Two times a day (BID) | ORAL | 1 refills | Status: DC
Start: 2023-03-08 — End: 2023-08-23

## 2023-03-08 NOTE — Telephone Encounter (Signed)
Prescription refill request for Xarelto received.  Indication: peripheral vascular disease  Last office visit: 09/16/22 [name])  Weight: 76kg Age: 67 Scr: 1.10 (02/14/23)  CrCl: 70.8ml/min  Appropriate dose. Refill sent.

## 2023-03-09 DIAGNOSIS — H25811 Combined forms of age-related cataract, right eye: Secondary | ICD-10-CM | POA: Diagnosis not present

## 2023-03-10 DIAGNOSIS — H2511 Age-related nuclear cataract, right eye: Secondary | ICD-10-CM | POA: Diagnosis not present

## 2023-03-20 DIAGNOSIS — R32 Unspecified urinary incontinence: Secondary | ICD-10-CM | POA: Diagnosis not present

## 2023-03-20 DIAGNOSIS — C679 Malignant neoplasm of bladder, unspecified: Secondary | ICD-10-CM | POA: Diagnosis not present

## 2023-03-30 ENCOUNTER — Other Ambulatory Visit: Payer: Self-pay | Admitting: Family Medicine

## 2023-04-06 LAB — MICROALBUMIN, URINE: Microalb, Ur: 99

## 2023-05-10 ENCOUNTER — Other Ambulatory Visit: Payer: Self-pay | Admitting: Family Medicine

## 2023-05-17 ENCOUNTER — Encounter: Payer: Self-pay | Admitting: Family Medicine

## 2023-05-17 ENCOUNTER — Ambulatory Visit (INDEPENDENT_AMBULATORY_CARE_PROVIDER_SITE_OTHER): Payer: Medicare Other | Admitting: Family Medicine

## 2023-05-17 VITALS — BP 119/79 | HR 68 | Wt 163.0 lb

## 2023-05-17 DIAGNOSIS — E7800 Pure hypercholesterolemia, unspecified: Secondary | ICD-10-CM | POA: Diagnosis not present

## 2023-05-17 DIAGNOSIS — E119 Type 2 diabetes mellitus without complications: Secondary | ICD-10-CM | POA: Diagnosis not present

## 2023-05-17 DIAGNOSIS — I10 Essential (primary) hypertension: Secondary | ICD-10-CM | POA: Diagnosis not present

## 2023-05-17 DIAGNOSIS — E1169 Type 2 diabetes mellitus with other specified complication: Secondary | ICD-10-CM

## 2023-05-17 DIAGNOSIS — Z23 Encounter for immunization: Secondary | ICD-10-CM | POA: Diagnosis not present

## 2023-05-17 LAB — BASIC METABOLIC PANEL
BUN: 15 mg/dL (ref 6–23)
CO2: 25 meq/L (ref 19–32)
Calcium: 9.7 mg/dL (ref 8.4–10.5)
Chloride: 103 meq/L (ref 96–112)
Creatinine, Ser: 1.11 mg/dL (ref 0.40–1.50)
GFR: 68.5 mL/min (ref >60.00)
Glucose, Bld: 140 mg/dL — ABNORMAL HIGH (ref 70–99)
Potassium: 4.4 meq/L (ref 3.5–5.1)
Sodium: 137 meq/L (ref 135–145)

## 2023-05-17 LAB — LIPID PANEL
Cholesterol: 97 mg/dL (ref 0–200)
HDL: 29.8 mg/dL — ABNORMAL LOW (ref >39.00)
LDL Cholesterol: 45 mg/dL (ref 0–99)
NonHDL: 66.92
Total CHOL/HDL Ratio: 3
Triglycerides: 110 mg/dL (ref 0.0–149.0)
VLDL: 22 mg/dL (ref 0.0–40.0)

## 2023-05-17 LAB — POCT GLYCOSYLATED HEMOGLOBIN (HGB A1C)
HbA1c POC (<> result, manual entry): 6.6 %
HbA1c, POC (controlled diabetic range): 6.6 % (ref 0.0–7.0)
HbA1c, POC (prediabetic range): 6.6 % — AB (ref 5.7–6.4)
Hemoglobin A1C: 6.6 % — AB (ref 4.0–5.6)

## 2023-05-17 LAB — MICROALBUMIN / CREATININE URINE RATIO
Creatinine,U: 131.5 mg/dL
Microalb Creat Ratio: 14.4 mg/g (ref 0.0–30.0)
Microalb, Ur: 18.9 mg/dL — ABNORMAL HIGH (ref 0.0–1.9)

## 2023-05-17 MED ORDER — TRULICITY 1.5 MG/0.5ML ~~LOC~~ SOAJ
1.5000 mg | SUBCUTANEOUS | 1 refills | Status: DC
Start: 2023-05-17 — End: 2023-10-30

## 2023-05-17 MED ORDER — METFORMIN HCL 1000 MG PO TABS: 1000.0000 mg | ORAL_TABLET | Freq: Two times a day (BID) | ORAL | 1 refills | Status: DC

## 2023-05-17 NOTE — Progress Notes (Signed)
OFFICE VISIT  05/17/2023  CC:  Chief Complaint  Patient presents with   Medical Management of Chronic Issues    Pt is fasting.     Patient is a 67 y.o. male who presents for 44-month follow-up diabetes, hypertension, and hyperlipidemia. A/P as of last visit: "#1 diabetes without complication, poor control. POC Hba1c 7.2% today--> improving. Continue trulicity 1.5 mg subcu weekly. Continue metformin 1000 mg twice a day. Electrolytes and renal function today.   2.  Hypertension, doing well on lisinopril 40 mg a day and Toprol-XL 50 mg a day. Electrolytes and creatinine today.   #3 hypercholesterolemia. LDL goal less than 70.  Last LDL was 37 about 3 months ago. Continue atorvastatin 80 mg daily. Check lipids in 3 months.   #4 right arm pain nodule. Possible basal cell versus squamous cell carcinoma. Discussed return appointment for shave excision and send to pathology versus referral to dermatology.  He wants to think about it at this time.  INTERIM HX: Feeling fine, no acute concerns. Has significant osteoarthritis in knees and hips, causing him significant pain when walking and he has to stop and rest after about 200 feet.  Additionally, he has claudication due to peripheral artery disease and his legs hurt at about the same distance.  No home glucose or blood pressure monitoring.   ROS as above, plus--> no fevers, no CP, no SOB, no wheezing, no cough, no dizziness, no HAs, no rashes, no melena/hematochezia.  No polyuria or polydipsia.   No focal weakness, paresthesias, or tremors.  No acute vision or hearing abnormalities.  No dysuria or unusual/new urinary urgency or frequency.  No recent changes in lower legs. No n/v/d or abd pain.  No palpitations.    Past Medical History:  Diagnosis Date   Arthritis    Ascending aorta dilatation (HCC)    a. Ascending aorta mildly dilated by echo 09/03/15.   Bladder cancer [facility]) 2012   Mass removed, then BCG by urologist (Dr. Laverle Patter).   Recurrence documented on cysto + bladder washings 10/14/15--getting more maintenance BCG.  Cysto 01/20/16 OK, then more BCG given.  Cysto 04/13/16 c/w just having finished BCG but cytology/FISH ok.  Cysto 07/27/2016 c/w small area of BCG related inflamm, BCG stopped due to severe sx's. 12/2020 cysto benign   Bladder cancer (HCC)    Surveillance cysto 11/02/16 cysto showed 1.5 cm raised erythematous lesion->bx benign. Subsequent cystoscopies unchanged, most recent 07/2019 and 04/2021.   BPH (benign prostatic hyperplasia)    with hx of acute prostatitis   CAD (coronary artery disease)    a. '05 MI > DES to circumflex. b. NSTEMI 08/2015 with total occlusion of the mid LAD along with 20% mid-distal Cx, 100% dCX, 40% D1, 50% pCx, 50% m-dRCA, 80% dRCA.Given >12 hours out from symptom onset, medical therapy recommended.   Diabetes mellitus type 2, controlled (HCC)    HbA1c 02/2011 was 6.6%   DVT (deep venous thrombosis) (HCC)    Elevated PSA 09/2011   with prostate nodule.  Prostate bx ALL BENIGN 10/10/11 (Dr. Marin Roberts followed by Urol--improved to 1.18 Jun 2017.  Nodule NOT palpable anymore on DRE at [facility] 01/2018 office f/u. Stable as of 08/2019. Slight incease 12/2020, urol to rpt 1 yr.   GERD (gastroesophageal reflux disease)    occasional   Headache(784.0)    cluster headaches a long time ago   History of adenomatous polyp of colon 09/23/2020   recall 09/2025   History of bronchitis    History of  vertigo    HTN (hypertension)    Hyperlipidemia    Ischemic cardiomyopathy    a. 2D Echo 09/03/15: mild LVH, EF 40-45%, diffuse HK, akinesis of apical myocardium, grade 1 DD, aortic root dimention 38mm, ascending aorta mildly dilated, mildly dilated RV/RA.    Myocardial infarction [facility]) 2005; 08/2015   followed by Dr. [name]   PAD (peripheral artery disease) (HCC) 06/2019   R LL abnl ABI and doppler->see PSH section of EMR for details.   Thrombocytopenia (HCC)    Stable 2012-2017   Tobacco abuse     Wolf-Parkinson-White syndrome     Past Surgical History:  Procedure Laterality Date   BLADDER TUMOR EXCISION  11/08/10; 05/2015   urothelial carcinoma (Dr. Laverle Patter).  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   CARDIAC CATHETERIZATION  2005   with stent placement   CARDIAC CATHETERIZATION N/A 09/01/2015   Procedure: Left Heart Cath and Coronary Angiography;  Surgeon: Lyn Records, MD;  Location: [facility] INVASIVE CV LAB;  Service: Cardiovascular;  Laterality: N/A;   COLONOSCOPY  2010; 09/23/20   2010 Dr. Gloriajean Dell. 09/2020 Dr. Hope Budds 09/2025.   CYSTOSCOPY W/ RETROGRADES Bilateral 06/22/2015   Procedure: CYSTOSCOPY WITH RETROGRADE PYELOGRAM;  Surgeon: Heloise Purpura, MD;  Location[facility];  Service: Urology;  Laterality: Bilateral;   CYSTOSCOPY W/ RETROGRADES N/A 11/17/2016   Procedure: CYSTOSCOPY WITH RETROGRADE PYELOGRAM;  Surgeon: Heloise Purpura, MD;  Location: [facility];  Service: Urology;  Laterality: N/A;   CYSTOSCOPY WITH RETROGRADE PYELOGRAM, URETEROSCOPY AND STENT PLACEMENT N/A 06/06/2013   Procedure: CYSTOSCOPY WITH RETROGRADE PYELOGRAM;  Surgeon: Crecencio Mc, MD;  Location: [facility];  Service: Urology;  Laterality: N/A;   LE ABI vascular studies  06/2019   R LL claudication + evidence of PAD (ABI very low AND popliteal and peroneal artery occlusion on dopler u/s) in R leg (L leg fine)->referred to Dr. Kirke Corin.  Conservative mgmt with walking program and low dose xarelto first, then consider revascularization.   lexiscan  2012   Normal/negative   LUMBAR SPINE SURGERY  1989 & 1991   Neurosurgeon in [place] ([place])-no trouble since last surgery   myocardial perfusion study  2009   Neg ischemia, EF 52%   TRANSTHORACIC ECHOCARDIOGRAM     09/2022 EF 50-55%, apical hypokinesis, indeterm diast fxn, AV sclerosis w/out stenosis.  Ascending aorta dilation to 38 mm   TRANSURETHRAL RESECTION OF BLADDER TUMOR N/A 06/06/2013   Procedure: TRANSURETHRAL  RESECTION OF BLADDER TUMOR (TURBT);  Surgeon: Crecencio Mc, MD;  Location[facility];  Service: Urology;  Laterality: N/A;.  PATH: squamous metaplasia, fibrosis with chronic inflammation, no malignancy.   TRANSURETHRAL RESECTION OF BLADDER TUMOR N/A 06/22/2015   Carcinoma in situ: no invasive malignancy.  Procedure: TRANSURETHRAL RESECTION OF BLADDER TUMOR (TURBT);  Surgeon: Heloise Purpura, MD;  Location[facility];  Service: Urology;  Laterality: N/A;  **BLADDER BIOPSY*RESECTION**   TRANSURETHRAL RESECTION OF BLADDER TUMOR N/A 11/17/2016   Path: BENIGN UROTHELIUM WITH INFLAMMATION--no malignancy. Procedure: TRANSURETHRAL RESECTION OF BLADDER TUMOR (TURBT);  Surgeon: Heloise Purpura, MD;  [facility]: [facility];  Service: Urology;  Laterality: N/A;   VASECTOMY      Outpatient Medications Prior to Visit  Medication Sig Dispense Refill   acetaminophen (TYLENOL) 500 MG tablet Take 1,000 mg by mouth daily as needed for mild pain or headache.     aspirin EC 81 MG tablet Take 81 mg by mouth daily.     atorvastatin (LIPITOR) 80 MG tablet TAKE 1 TABLET(80 MG)  BY MOUTH EVERY EVENING 90 tablet 3   calcium carbonate (TUMS - DOSED IN MG ELEMENTAL CALCIUM) 500 MG chewable tablet Chew 1 tablet by mouth daily as needed for indigestion or heartburn.     lisinopril (ZESTRIL) 40 MG tablet Take 1 tablet (40 mg total) by mouth daily. 90 tablet 3   loratadine (CLARITIN) 10 MG tablet Take 10 mg by mouth as needed for allergies.     metoprolol succinate (TOPROL-XL) 50 MG 24 hr tablet Take 1 tablet (50 mg total) by mouth daily. Take with or immediately following a meal. 90 tablet 3   nitroGLYCERIN (NITROSTAT) 0.4 MG SL tablet PLACE 1 TABLET UNDER THE TONGUE EVERY 5 (FIVE) MINUTES AS NEEDED FOR CHEST PAIN (UP TO 3 DOSES). 25 tablet 3   rivaroxaban (XARELTO) 2.5 MG TABS tablet Take 1 tablet (2.5 mg total) by mouth 2 (two) times daily. 180 tablet 1   tamsulosin (FLOMAX) 0.4 MG CAPS capsule TAKE 1 CAPSULE BY MOUTH EVERY DAY AFTER SUPPER 90  capsule 0   Dulaglutide (TRULICITY) 1.5 MG/0.5ML SOPN Inject 1.5 mg into the skin once a week. 6 mL 1   metFORMIN (GLUCOPHAGE) 1000 MG tablet Take 1 tablet (1,000 mg total) by mouth 2 (two) times daily with a meal. MUST KEEP APPT FOR FURTHER REFILLS 60 tablet 0   No facility-administered medications prior to visit.    No Known Allergies  Review of Systems As per HPI  PE:    05/17/2023    8:35 AM 02/14/2023    8:11 AM 11/15/2022   10:05 AM  Vitals with BMI  Height   5\' 7"   Weight 163 lbs 167 lbs 10 oz 173 lbs 3 oz  BMI   27.12  Systolic 119 115 762  Diastolic 79 75 87  Pulse 68 93 73     Physical Exam  Gen: Alert, well appearing.  Patient is oriented to person, place, time, and situation. AFFECT: pleasant, lucid thought and speech. CV: RRR, no m/r/g.   LUNGS: CTA bilat, nonlabored resps, good aeration in all lung fields. EXT: no clubbing or cyanosis.  no edema.    LABS:  Last CBC Lab Results  Component Value Date   WBC 8.6 11/15/2022   HGB 16.5 11/15/2022   HCT 46.7 11/15/2022   MCV 91.2 11/15/2022   MCH 30.6 11/08/2016   RDW 14.1 11/15/2022   PLT 136.0 (L) 11/15/2022   Last metabolic panel Lab Results  Component Value Date   GLUCOSE 176 (H) 02/14/2023   NA 136 02/14/2023   K 4.3 02/14/2023   CL 104 02/14/2023   CO2 24 02/14/2023   BUN 15 02/14/2023   CREATININE 1.10 02/14/2023   GFR 69.37 02/14/2023   CALCIUM 9.2 02/14/2023   PROT 6.6 11/15/2022   ALBUMIN 4.4 11/15/2022   BILITOT 1.0 11/15/2022   ALKPHOS 75 11/15/2022   AST 17 11/15/2022   ALT 18 11/15/2022   ANIONGAP 9 11/08/2016   Last lipids Lab Results  Component Value Date   CHOL 82 11/15/2022   HDL 28.40 (L) 11/15/2022   LDLCALC 37 11/15/2022   LDLDIRECT 87.2 09/03/2010   TRIG 80.0 11/15/2022   CHOLHDL 3 11/15/2022   Last hemoglobin A1c Lab Results  Component Value Date   HGBA1C 6.6 (A) 05/17/2023   HGBA1C 6.6 05/17/2023   HGBA1C 6.6 (A) 05/17/2023   HGBA1C 6.6 05/17/2023    Last thyroid functions Lab Results  Component Value Date   TSH 0.96 08/26/2015   IMPRESSION AND PLAN:  1 diabetes without complication, good control. POC Hba1c 6.6% today--> improving. Continue trulicity 1.5 mg subcu weekly. Continue metformin 1000 mg twice a day. Electrolytes and renal function today.   2.  Hypertension, doing well on lisinopril 40 mg a day and Toprol-XL 50 mg a day. Electrolytes and creatinine today.   #3 hypercholesterolemia. LDL goal less than 70.  Last LDL was 37 about 6 months ago. Continue atorvastatin 80 mg daily. Check lipids and hepatic panel today.  An After Visit Summary was printed and given to the patient.  FOLLOW UP: Return in about 6 months (around 11/15/2023) for annual CPE (fasting). Next CPE 10/2023 Signed:  Santiago Bumpers, MD           05/17/2023

## 2023-05-18 ENCOUNTER — Other Ambulatory Visit: Payer: Self-pay | Admitting: Family Medicine

## 2023-05-23 ENCOUNTER — Telehealth: Payer: Self-pay

## 2023-05-23 NOTE — Telephone Encounter (Signed)
Form given to Dr Milinda Cave

## 2023-05-23 NOTE — Telephone Encounter (Signed)
Type of forms received:  [place] [employer]  Routed to:  [employer]  Paperwork received by :  Annabelle Harman   Individual made aware of 5-7 business day turn around (Y/N):  Yes  Please call patient when ready for p/u [phone]  Form completed and patient made aware of charges(Y/N): n/a   Faxed to : n/a  Form location:   [place] inbox front office

## 2023-05-24 NOTE — Telephone Encounter (Signed)
Called patient to let him know form is ready for pick up. He will come today to get, told him office is closed for lunch. Very appreciative.

## 2023-06-13 ENCOUNTER — Ambulatory Visit: Payer: Medicare Other | Attending: Cardiovascular Disease | Admitting: Cardiovascular Disease

## 2023-06-13 ENCOUNTER — Encounter: Payer: Self-pay | Admitting: Cardiovascular Disease

## 2023-06-13 VITALS — BP 118/72 | HR 71 | Ht 66.0 in | Wt 165.6 lb

## 2023-06-13 DIAGNOSIS — I255 Ischemic cardiomyopathy: Secondary | ICD-10-CM

## 2023-06-13 DIAGNOSIS — E785 Hyperlipidemia, unspecified: Secondary | ICD-10-CM | POA: Diagnosis not present

## 2023-06-13 DIAGNOSIS — I10 Essential (primary) hypertension: Secondary | ICD-10-CM

## 2023-06-13 DIAGNOSIS — I739 Peripheral vascular disease, unspecified: Secondary | ICD-10-CM | POA: Diagnosis not present

## 2023-06-13 DIAGNOSIS — I2510 Atherosclerotic heart disease of native coronary artery without angina pectoris: Secondary | ICD-10-CM

## 2023-06-13 NOTE — Progress Notes (Signed)
Cardiology Office Note   Date:  06/13/2023   ID:  Mark Blankenship, DOB [birth year], MRN [id number]  PCP:  Jeoffrey Massed, MD  Cardiologist:   Dr. [name]  No chief complaint on file.      History of Present Illness: Mark Blankenship is a 67 y.o. male who is here today for follow-up visit regarding peripheral arterial disease.   The patient has known history of coronary artery disease with previous myocardial infarction with an occluded LAD with late presentation, ischemic cardiomyopathy with an EF of 40 to 45%, type 2 diabetes for at least 20 years, essential hypertension and hyperlipidemia.  He is not a smoker but does have strong family history of coronary artery disease.   He is followed for right calf claudication with moderately reduced ABI due to an occluded popliteal artery. He has been treated medically and his symptoms improved with walking.  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   He has been doing very well overall with no chest pain or shortness of breath.    He used to have right calf claudication but has not felt any symptoms over the last year.  No issues with low-dose Xarelto other than cost.   Past Medical History:  Diagnosis Date   Arthritis    Ascending aorta dilatation (HCC)    a. Ascending aorta mildly dilated by echo 09/03/15.   Bladder cancer [facility]) 2012   Mass removed, then BCG by urologist (Dr. Laverle Patter).  Recurrence documented on cysto + bladder washings 10/14/15--getting more maintenance BCG.  Cysto 01/20/16 OK, then more BCG given.  Cysto 04/13/16 c/w just having finished BCG but cytology/FISH ok.  Cysto 07/27/2016 c/w small area of BCG related inflamm, BCG stopped due to severe sx's. 12/2020 cysto benign   Bladder cancer (HCC)    Surveillance cysto 11/02/16 cysto showed 1.5 cm raised erythematous lesion->bx benign. Subsequent cystoscopies unchanged, most recent 07/2019 and 04/2021.   BPH (benign prostatic hyperplasia)    with hx of acute prostatitis    CAD (coronary artery disease)    a. '05 MI > DES to circumflex. b. NSTEMI 08/2015 with total occlusion of the mid LAD along with 20% mid-distal Cx, 100% dCX, 40% D1, 50% pCx, 50% m-dRCA, 80% dRCA.Given >12 hours out from symptom onset, medical therapy recommended.   Diabetes mellitus type 2, controlled (HCC)    HbA1c 02/2011 was 6.6%   DVT (deep venous thrombosis) (HCC)    Elevated PSA 09/2011   with prostate nodule.  Prostate bx ALL BENIGN 10/10/11 (Dr. Marin Roberts followed by Urol--improved to 1.18 Jun 2017.  Nodule NOT palpable anymore on DRE at [facility] 01/2018 office f/u. Stable as of 08/2019. Slight incease 12/2020, urol to rpt 1 yr.   GERD (gastroesophageal reflux disease)    occasional   Headache(784.0)    cluster headaches a long time ago   History of adenomatous polyp of colon 09/23/2020   recall 09/2025   History of bronchitis    History of vertigo    HTN (hypertension)    Hyperlipidemia    Ischemic cardiomyopathy    a. 2D Echo 09/03/15: mild LVH, EF 40-45%, diffuse HK, akinesis of apical myocardium, grade 1 DD, aortic root dimention 38mm, ascending aorta mildly dilated, mildly dilated RV/RA.    Myocardial infarction [facility]) 2005; 08/2015   followed by Dr. [name]   PAD (peripheral artery disease) (HCC) 06/2019   R LL abnl ABI and doppler->see PSH section of EMR for details.   Thrombocytopenia (  HCC)    Stable 2012-2017   Tobacco abuse    Wolf-Parkinson-White syndrome     Past Surgical History:  Procedure Laterality Date   BLADDER TUMOR EXCISION  11/08/10; 05/2015   urothelial carcinoma (Dr. Laverle Patter).  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   CARDIAC CATHETERIZATION  2005   with stent placement   CARDIAC CATHETERIZATION N/A 09/01/2015   Procedure: Left Heart Cath and Coronary Angiography;  Surgeon: Lyn Records, MD;  Location: [facility] INVASIVE CV LAB;  Service: Cardiovascular;  Laterality: N/A;   COLONOSCOPY  2010; 09/23/20   2010 Dr. Gloriajean Dell.  09/2020 Dr. Hope Budds 09/2025.   CYSTOSCOPY W/ RETROGRADES Bilateral 06/22/2015   Procedure: CYSTOSCOPY WITH RETROGRADE PYELOGRAM;  Surgeon: Heloise Purpura, MD;  Location: [facility];  Service: Urology;  Laterality: Bilateral;   CYSTOSCOPY W/ RETROGRADES N/A 11/17/2016   Procedure: CYSTOSCOPY WITH RETROGRADE PYELOGRAM;  Surgeon: Heloise Purpura, MD;  Location: [facility];  Service: Urology;  Laterality: N/A;   CYSTOSCOPY WITH RETROGRADE PYELOGRAM, URETEROSCOPY AND STENT PLACEMENT N/A 06/06/2013   Procedure: CYSTOSCOPY WITH RETROGRADE PYELOGRAM;  Surgeon: Crecencio Mc, MD;  Location: [facility];  Service: Urology;  Laterality: N/A;   LE ABI vascular studies  06/2019   R LL claudication + evidence of PAD (ABI very low AND popliteal and peroneal artery occlusion on dopler u/s) in R leg (L leg fine)->referred to Dr. Kirke Corin.  Conservative mgmt with walking program and low dose xarelto first, then consider revascularization.   lexiscan  2012   Normal/negative   LUMBAR SPINE SURGERY  1989 & 1991   Neurosurgeon in [place] ([facility])-no trouble since last surgery   myocardial perfusion study  2009   Neg ischemia, EF 52%   TRANSTHORACIC ECHOCARDIOGRAM     09/2022 EF 50-55%, apical hypokinesis, indeterm diast fxn, AV sclerosis w/out stenosis.  Ascending aorta dilation to 38 mm   TRANSURETHRAL RESECTION OF BLADDER TUMOR N/A 06/06/2013   Procedure: TRANSURETHRAL RESECTION OF BLADDER TUMOR (TURBT);  Surgeon: Crecencio Mc, MD;  Location: [facility];  Service: Urology;  Laterality: N/A;.  PATH: squamous metaplasia, fibrosis with chronic inflammation, no malignancy.   TRANSURETHRAL RESECTION OF BLADDER TUMOR N/A 06/22/2015   Carcinoma in situ: no invasive malignancy.  Procedure: TRANSURETHRAL RESECTION OF BLADDER TUMOR (TURBT);  Surgeon: Heloise Purpura, MD;  Location: [facility];  Service: Urology;  Laterality: N/A;  **BLADDER BIOPSY*RESECTION**   TRANSURETHRAL RESECTION OF BLADDER TUMOR N/A 11/17/2016   Path: BENIGN UROTHELIUM WITH  INFLAMMATION--no malignancy. Procedure: TRANSURETHRAL RESECTION OF BLADDER TUMOR (TURBT);  Surgeon: Heloise Purpura, MD;  Location: [facility];  Service: Urology;  Laterality: N/A;   VASECTOMY       Current Outpatient Medications  Medication Sig Dispense Refill   acetaminophen (TYLENOL) 500 MG tablet Take 1,000 mg by mouth daily as needed for mild pain or headache.     aspirin EC 81 MG tablet Take 81 mg by mouth daily.     atorvastatin (LIPITOR) 80 MG tablet TAKE 1 TABLET(80 MG) BY MOUTH EVERY EVENING 90 tablet 3   calcium carbonate (TUMS - DOSED IN MG ELEMENTAL CALCIUM) 500 MG chewable tablet Chew 1 tablet by mouth daily as needed for indigestion or heartburn.     Dulaglutide (TRULICITY) 1.5 MG/0.5ML SOAJ Inject 1.5 mg into the skin once a week. 6 mL 1   lisinopril (ZESTRIL) 40 MG tablet Take 1 tablet (40 mg total) by mouth daily. 90 tablet 3   loratadine (CLARITIN) 10 MG tablet Take 10 mg by mouth  as needed for allergies.     metFORMIN (GLUCOPHAGE) 1000 MG tablet Take 1 tablet (1,000 mg total) by mouth 2 (two) times daily with a meal. 180 tablet 1   metoprolol succinate (TOPROL-XL) 50 MG 24 hr tablet Take 1 tablet (50 mg total) by mouth daily. Take with or immediately following a meal. 90 tablet 3   nitroGLYCERIN (NITROSTAT) 0.4 MG SL tablet PLACE 1 TABLET UNDER THE TONGUE EVERY 5 (FIVE) MINUTES AS NEEDED FOR CHEST PAIN (UP TO 3 DOSES). 25 tablet 3   rivaroxaban (XARELTO) 2.5 MG TABS tablet Take 1 tablet (2.5 mg total) by mouth 2 (two) times daily. 180 tablet 1   tamsulosin (FLOMAX) 0.4 MG CAPS capsule TAKE 1 CAPSULE BY MOUTH EVERY DAY AFTER SUPPER 90 capsule 0   No current facility-administered medications for this visit.    Allergies:   Patient has no known allergies.    Social History:  The patient  reports that he quit smoking about 7 years ago. His smoking use included cigarettes. He started smoking about 47 years ago. He has a 10 pack-year smoking history. He has never used smokeless  tobacco. He reports that he does not drink alcohol and does not use drugs.   Family History:  The patient's family history includes Cancer in his father; Diabetes in his brother; Heart disease in his brother and paternal uncle; Hypertension in his father and mother; Multiple sclerosis in his brother.    ROS:  Please see the history of present illness.   Otherwise, review of systems are positive for none.   All other systems are reviewed and negative.    PHYSICAL EXAM: VS:  BP 118/72 (BP Location: Left Arm, Patient Position: Sitting, Cuff Size: Normal)   Pulse 71   Ht 5\' 6"  (1.676 m)   Wt 165 lb 9.6 oz (75.1 kg)   SpO2 98%   BMI 26.73 kg/m  , BMI Body mass index is 26.73 kg/m. GEN: Well nourished, well developed, in no acute distress  HEENT: normal  Neck: no JVD, carotid bruits, or masses Cardiac: RRR; no murmurs, rubs, or gallops,no edema  Respiratory:  clear to auscultation bilaterally, normal work of breathing GI: soft, nontender, nondistended, + BS MS: no deformity or atrophy  Skin: warm and dry, no rash Neuro:  Strength and sensation are intact Psych: euthymic mood, full affect    EKG:  EKG is ordered today. EKG showed : Normal sinus rhythm Left axis deviation When compared with ECG of 02-Sep-2015 08:00, Vent. rate has increased BY  26 BPM     Recent Labs: 11/15/2022: ALT 18; Hemoglobin 16.5; Platelets 136.0 05/17/2023: BUN 15; Creatinine, Ser 1.11; Potassium 4.4; Sodium 137    Lipid Panel    Component Value Date/Time   CHOL 97 05/17/2023 0856   TRIG 110.0 05/17/2023 0856   HDL 29.80 (L) 05/17/2023 0856   CHOLHDL 3 05/17/2023 0856   VLDL 22.0 05/17/2023 0856   LDLCALC 45 05/17/2023 0856   LDLDIRECT 87.2 09/03/2010 1110      Wt Readings from Last 3 Encounters:  06/13/23 165 lb 9.6 oz (75.1 kg)  05/17/23 163 lb (73.9 kg)  02/14/23 167 lb 9.6 oz (76 kg)            No data to display            ASSESSMENT AND PLAN:  1.  Peripheral arterial  disease with right calf claudication: due to an occluded right popliteal artery.  He reports resolution of right  calf claudication.  Continue medical therapy.    He is on long-term treatment with low-dose Xarelto which seems to be well-tolerated.   2.  Coronary artery disease involving native coronary arteries without angina: Continue medical therapy.  3.  Mild ischemic cardiomyopathy: No signs of volume overload.  4.  Hyperlipidemia: Continue atorvastatin.  Most recent lipid profile last month showed an LDL of 45.    Disposition:   FU with me in 12 months  Signed,  Lorine Bears, MD  06/13/2023 9:42 AM    [facility]

## 2023-06-13 NOTE — Patient Instructions (Signed)

## 2023-06-29 ENCOUNTER — Other Ambulatory Visit: Payer: Self-pay | Admitting: Family Medicine

## 2023-07-06 ENCOUNTER — Telehealth: Payer: Self-pay | Admitting: Family Medicine

## 2023-07-06 NOTE — Telephone Encounter (Signed)
LVM will call back tomorrow.

## 2023-07-06 NOTE — Telephone Encounter (Signed)
Mark Blankenship with [employer] called in regards to an abnormal result. She can reached at [phone] Hours of operation are [phone].

## 2023-08-23 ENCOUNTER — Other Ambulatory Visit: Payer: Self-pay | Admitting: Family Medicine

## 2023-08-23 ENCOUNTER — Other Ambulatory Visit: Payer: Self-pay | Admitting: Cardiology

## 2023-08-23 DIAGNOSIS — I2510 Atherosclerotic heart disease of native coronary artery without angina pectoris: Secondary | ICD-10-CM

## 2023-08-23 DIAGNOSIS — I255 Ischemic cardiomyopathy: Secondary | ICD-10-CM

## 2023-08-23 NOTE — Telephone Encounter (Signed)
Prescription refill request for Xarelto received.  Indication: PAD/ICM Last office visit: 06/13/23  Terrilee Files MD Weight: 75.1kg Age: 68 Scr: 1.11 on 05/17/23  Epic CrCl: 67.66  Based on above findings Xarelto 2.5mg  twice daily is the appropriate dose.  Refill approved.

## 2023-09-20 ENCOUNTER — Ambulatory Visit (INDEPENDENT_AMBULATORY_CARE_PROVIDER_SITE_OTHER): Payer: Medicare Other

## 2023-09-20 DIAGNOSIS — Z1211 Encounter for screening for malignant neoplasm of colon: Secondary | ICD-10-CM

## 2023-09-20 DIAGNOSIS — Z0000 Encounter for general adult medical examination without abnormal findings: Secondary | ICD-10-CM

## 2023-09-20 NOTE — Progress Notes (Signed)
 Subjective:   Mark Blankenship is a 68 y.o. who presents for a Medicare Wellness preventive visit.  Visit Complete: Virtual I connected with  Mark Blankenship on 09/20/23 by a audio enabled telemedicine application and verified that I am speaking with the correct person using two identifiers.  Patient Location: Home  Provider Location: Office/Clinic  I discussed the limitations of evaluation and management by telemedicine. The patient expressed understanding and agreed to proceed.  Vital Signs: Because this visit was a virtual/telehealth visit, some criteria may be missing or patient reported. Any vitals not documented were not able to be obtained and vitals that have been documented are patient reported.  VideoDeclined- This patient declined Librarian, academic. Therefore the visit was completed with audio only.  AWV Questionnaire: No: Patient Medicare AWV questionnaire was not completed prior to this visit.  Cardiac Risk Factors include: advanced age (>43men, >30 women);dyslipidemia;hypertension;male gender;diabetes mellitus     Objective:    There were no vitals filed for this visit. There is no height or weight on file to calculate BMI.     09/20/2023    8:55 AM 09/14/2022    9:37 AM 09/01/2021    2:36 PM 11/17/2016   10:54 AM 11/08/2016   10:03 AM 09/03/2015    7:39 AM 06/16/2015   10:15 AM  Advanced Directives  Does Patient Have a Medical Advance Directive? No No No No No No No  Would patient like information on creating a medical advance directive? No - Patient declined No - Patient declined No - Patient declined No - Patient declined No - Patient declined No - patient declined information No - patient declined information    Current Medications (verified) Outpatient Encounter Medications as of 09/20/2023  Medication Sig   acetaminophen (TYLENOL) 500 MG tablet Take 1,000 mg by mouth daily as needed for mild pain or headache.   aspirin EC 81  MG tablet Take 81 mg by mouth daily.   atorvastatin (LIPITOR) 80 MG tablet TAKE 1 TABLET(80 MG) BY MOUTH EVERY EVENING   calcium carbonate (TUMS - DOSED IN MG ELEMENTAL CALCIUM) 500 MG chewable tablet Chew 1 tablet by mouth daily as needed for indigestion or heartburn.   Dulaglutide (TRULICITY) 1.5 MG/0.5ML SOAJ Inject 1.5 mg into the skin once a week.   lisinopril (ZESTRIL) 40 MG tablet Take 1 tablet (40 mg total) by mouth daily.   loratadine (CLARITIN) 10 MG tablet Take 10 mg by mouth as needed for allergies.   metFORMIN (GLUCOPHAGE) 1000 MG tablet Take 1 tablet (1,000 mg total) by mouth 2 (two) times daily with a meal.   metoprolol succinate (TOPROL-XL) 50 MG 24 hr tablet TAKE 1 TABLET(50 MG) BY MOUTH DAILY WITH OR IMMEDIATELY FOLLOWING A MEAL   nitroGLYCERIN (NITROSTAT) 0.4 MG SL tablet PLACE 1 TABLET UNDER THE TONGUE EVERY 5 (FIVE) MINUTES AS NEEDED FOR CHEST PAIN (UP TO 3 DOSES).   tamsulosin (FLOMAX) 0.4 MG CAPS capsule TAKE 1 CAPSULE BY MOUTH EVERY DAY AFTER SUPPER   XARELTO 2.5 MG TABS tablet TAKE 1 TABLET(2.5 MG) BY MOUTH TWICE DAILY   No facility-administered encounter medications on file as of 09/20/2023.    Allergies (verified) Patient has no known allergies.   History: Past Medical History:  Diagnosis Date   Arthritis    Ascending aorta dilatation (HCC)    a. Ascending aorta mildly dilated by echo 09/03/15.   Bladder cancer [facility]) 2012   Mass removed, then BCG by urologist (Dr. Laverle Patter).  Recurrence documented on cysto + bladder washings 10/14/15--getting more maintenance BCG.  Cysto 01/20/16 OK, then more BCG given.  Cysto 04/13/16 c/w just having finished BCG but cytology/FISH ok.  Cysto 07/27/2016 c/w small area of BCG related inflamm, BCG stopped due to severe sx's. 12/2020 cysto benign   Bladder cancer (HCC)    Surveillance cysto 11/02/16 cysto showed 1.5 cm raised erythematous lesion->bx benign. Subsequent cystoscopies unchanged, most recent 07/2019 and 04/2021.   BPH (benign  prostatic hyperplasia)    with hx of acute prostatitis   CAD (coronary artery disease)    a. '05 MI > DES to circumflex. b. NSTEMI 08/2015 with total occlusion of the mid LAD along with 20% mid-distal Cx, 100% dCX, 40% D1, 50% pCx, 50% m-dRCA, 80% dRCA.Given >12 hours out from symptom onset, medical therapy recommended.   Diabetes mellitus type 2, controlled (HCC)    HbA1c 02/2011 was 6.6%   DVT (deep venous thrombosis) (HCC)    Elevated PSA 09/2011   with prostate nodule.  Prostate bx ALL BENIGN 10/10/11 (Dr. Marin Roberts followed by Urol--improved to 1.18 Jun 2017.  Nodule NOT palpable anymore on DRE at [facility] 01/2018 office f/u. Stable as of 08/2019. Slight incease 12/2020, urol to rpt 1 yr.   GERD (gastroesophageal reflux disease)    occasional   Headache(784.0)    cluster headaches a long time ago   History of adenomatous polyp of colon 09/23/2020   recall 09/2025   History of bronchitis    History of vertigo    HTN (hypertension)    Hyperlipidemia    Ischemic cardiomyopathy    a. 2D Echo 09/03/15: mild LVH, EF 40-45%, diffuse HK, akinesis of apical myocardium, grade 1 DD, aortic root dimention 38mm, ascending aorta mildly dilated, mildly dilated RV/RA.    Myocardial infarction [facility]) 2005; 08/2015   followed by Dr. [name]   PAD (peripheral artery disease) (HCC) 06/2019   R LL abnl ABI and doppler->see PSH section of EMR for details.   Thrombocytopenia (HCC)    Stable 2012-2017   Tobacco abuse    Wolf-Parkinson-White syndrome    Past Surgical History:  Procedure Laterality Date   BLADDER TUMOR EXCISION  11/08/10; 05/2015   urothelial carcinoma (Dr. Laverle Patter).  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   CARDIAC CATHETERIZATION  2005   with stent placement   CARDIAC CATHETERIZATION N/A 09/01/2015   Procedure: Left Heart Cath and Coronary Angiography;  Surgeon: Lyn Records, MD;  Location: [facility] INVASIVE CV LAB;  Service: Cardiovascular;  Laterality:  N/A;   COLONOSCOPY  2010; 09/23/20   2010 Dr. Gloriajean Dell. 09/2020 Dr. Hope Budds 09/2025.   CYSTOSCOPY W/ RETROGRADES Bilateral 06/22/2015   Procedure: CYSTOSCOPY WITH RETROGRADE PYELOGRAM;  Surgeon: Heloise Purpura, MD;  Location: [facility];  Service: Urology;  Laterality: Bilateral;   CYSTOSCOPY W/ RETROGRADES N/A 11/17/2016   Procedure: CYSTOSCOPY WITH RETROGRADE PYELOGRAM;  Surgeon: Heloise Purpura, MD;  Location: [facility];  Service: Urology;  Laterality: N/A;   CYSTOSCOPY WITH RETROGRADE PYELOGRAM, URETEROSCOPY AND STENT PLACEMENT N/A 06/06/2013   Procedure: CYSTOSCOPY WITH RETROGRADE PYELOGRAM;  Surgeon: Crecencio Mc, MD;  Location: [facility];  Service: Urology;  Laterality: N/A;   LE ABI vascular studies  06/2019   R LL claudication + evidence of PAD (ABI very low AND popliteal and peroneal artery occlusion on dopler u/s) in R leg (L leg fine)->referred to Dr. Kirke Corin.  Conservative mgmt with walking program and low dose xarelto first, then consider revascularization.  lexiscan  2012   Normal/negative   LUMBAR SPINE SURGERY  1989 & 1991   Neurosurgeon in [place] ([facility])-no trouble since last surgery   myocardial perfusion study  2009   Neg ischemia, EF 52%   TRANSTHORACIC ECHOCARDIOGRAM     09/2022 EF 50-55%, apical hypokinesis, indeterm diast fxn, AV sclerosis w/out stenosis.  Ascending aorta dilation to 38 mm   TRANSURETHRAL RESECTION OF BLADDER TUMOR N/A 06/06/2013   Procedure: TRANSURETHRAL RESECTION OF BLADDER TUMOR (TURBT);  Surgeon: Crecencio Mc, MD;  Location: [facility];  Service: Urology;  Laterality: N/A;.  PATH: squamous metaplasia, fibrosis with chronic inflammation, no malignancy.   TRANSURETHRAL RESECTION OF BLADDER TUMOR N/A 06/22/2015   Carcinoma in situ: no invasive malignancy.  Procedure: TRANSURETHRAL RESECTION OF BLADDER TUMOR (TURBT);  Surgeon: Heloise Purpura, MD;  Location: [facility];  Service: Urology;  Laterality: N/A;  **BLADDER BIOPSY*RESECTION**   TRANSURETHRAL RESECTION OF  BLADDER TUMOR N/A 11/17/2016   Path: BENIGN UROTHELIUM WITH INFLAMMATION--no malignancy. Procedure: TRANSURETHRAL RESECTION OF BLADDER TUMOR (TURBT);  Surgeon: Heloise Purpura, MD;  Location: [facility];  Service: Urology;  Laterality: N/A;   VASECTOMY     Family History  Problem Relation Age of Onset   Hypertension Mother    Cancer Father        colon and prostate, mets to lungs.   Hypertension Father    Heart disease Brother        d. 73 MI   Multiple sclerosis Brother    Diabetes Brother    Heart disease Paternal Uncle    Colon cancer Neg Hx    Colon polyps Neg Hx    Esophageal cancer Neg Hx    Stomach cancer Neg Hx    Social History   Socioeconomic History   Marital status: Married    Spouse name: Not on file   Number of children: Not on file   Years of education: Not on file   Highest education level: 12th grade  Occupational History   Not on file  Tobacco Use   Smoking status: Former    Current packs/day: 0.00    Average packs/day: 0.3 packs/day for 40.0 years (10.0 ttl pk-yrs)    Types: Cigarettes    Start date: 09/05/1975    Quit date: 09/05/2015    Years since quitting: 8.0   Smokeless tobacco: Never  Vaping Use   Vaping status: Never Used  Substance and Sexual Activity   Alcohol use: No    Alcohol/week: 0.0 standard drinks of alcohol   Drug use: No   Sexual activity: Not on file  Other Topics Concern   Not on file  Social History Narrative   Married, 2 children, 4 grandchildren--[place].   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   Tob: 30 pack-yr hx, quit 08/2011.   No alcohol or drug use.   Active on farm but no formal exercise regimen.   Social Drivers of Corporate investment banker Strain: Low Risk  (09/20/2023)   Overall Financial Resource Strain (CARDIA)    Difficulty of Paying Living Expenses: Not hard at all  Food Insecurity: No Food  Insecurity (09/20/2023)   Hunger Vital Sign    Worried About Running Out of Food in the Last Year: Never true    Ran Out of Food in the Last Year: Never true  Transportation Needs: No Transportation Needs (09/20/2023)  PRAPARE - Administrator, Civil Service (Medical): No    Lack of Transportation (Non-Medical): No  Physical Activity: Insufficiently Active (09/20/2023)   Exercise Vital Sign    Days of Exercise per Week: 5 days    Minutes of Exercise per Session: 20 min  Stress: No Stress Concern Present (09/20/2023)   [employer] of Occupational Health - Occupational Stress Questionnaire    Feeling of Stress : Only a little  Social Connections: Moderately Integrated (09/20/2023)   Social Connection and Isolation Panel [NHANES]    Frequency of Communication with Friends and Family: More than three times a week    Frequency of Social Gatherings with Friends and Family: More than three times a week    Attends Religious Services: 1 to 4 times per year    Active Member of [employer] or Organizations: No    Attends Engineer, structural: Never    Marital Status: Married    Tobacco Counseling Counseling given: Not Answered    Clinical Intake:  Pre-visit preparation completed: Yes  Pain : No/denies pain     BMI - recorded: 26.6 Nutritional Status: BMI 25 -29 Overweight Nutritional Risks: None Diabetes: Yes CBG done?: No Did pt. bring in CBG monitor from home?: No  How often do you need to have someone help you when you read instructions, pamphlets, or other written materials from your doctor or pharmacy?: 1 - Never  Interpreter Needed?: No  Information entered by :: Kennedy Bucker, LPN   Activities of Daily Living     09/20/2023    8:57 AM 09/18/2023   12:30 PM  In your present state of health, do you have any difficulty performing the following activities:  Hearing? 0 0  Vision? 0 0  Difficulty concentrating or making decisions? 0 0  Walking or  climbing stairs? 1 1  Comment KNEES   Dressing or bathing? 0 0  Doing errands, shopping? 0 0  Preparing Food and eating ? N N  Using the Toilet? N N  In the past six months, have you accidently leaked urine? N N  Do you have problems with loss of bowel control? Y Y  Managing your Medications? N N  Managing your Finances? N N  Housekeeping or managing your Housekeeping? N N    Patient Care Team: Jeoffrey Massed, MD as PCP - General (Family Medicine) [name] [name], MD as PCP - Cardiology (Cardiology) Iran Ouch, MD as PCP - [facility] Cardiology (Cardiology) Heloise Purpura, MD as Consulting Physician (Urology) [name] [name], MD as Consulting Physician (Cardiology) Rachael Fee, MD (Inactive) as Consulting Physician (Gastroenterology)  Indicate any recent Medical Services you may have received from other than [facility] providers in the past year (date may be approximate).     Assessment:   This is a routine wellness examination for [place].  Hearing/Vision screen Hearing Screening - Comments:: NO AIDS Vision Screening - Comments:: WEARS GLASSES ALL THE TIME- DR.DAUGHTRY IN [place]   Goals Addressed             This Visit's Progress    DIET - EAT MORE FRUITS AND VEGETABLES         Depression Screen     09/20/2023    8:53 AM 11/15/2022   10:04 AM 09/14/2022    9:36 AM 09/06/2022    1:22 PM 08/02/2022    9:08 AM 05/02/2022    8:29 AM 11/12/2021    8:14 AM  PHQ 2/9 Scores  PHQ - 2 Score 0 0 0 0 0 0 0  PHQ- 9 Score 0 5         Fall Risk     09/20/2023    8:57 AM 09/18/2023   12:30 PM 11/15/2022   10:04 AM 09/14/2022    9:38 AM 07/29/2022    3:31 PM  Fall Risk   Falls in the past year? 1 1 0 1 1  Number falls in past yr: 0 0 0 1 0  Injury with Fall? 0 0 0 0 0  Risk for fall due to :   No Fall Risks Impaired vision   Follow up Falls prevention discussed;Falls evaluation completed  Falls evaluation completed Falls prevention discussed     MEDICARE RISK AT  HOME:  Medicare Risk at Home Any stairs in or around the home?: Yes If so, are there any without handrails?: Yes Home free of loose throw rugs in walkways, pet beds, electrical cords, etc?: Yes Adequate lighting in your home to reduce risk of falls?: Yes Life alert?: No Use of a cane, walker or w/c?: Yes (CANE IF HAS A LOT OF WALKING TO DO) Grab bars in the bathroom?: No Shower chair or bench in shower?: No Elevated toilet seat or a handicapped toilet?: No  TIMED UP AND GO:  Was the test performed?  No  Cognitive Function: 6CIT completed        09/20/2023    8:59 AM 09/14/2022    9:40 AM 09/01/2021    2:38 PM  6CIT Screen  What Year? 0 points 0 points 0 points  What month? 0 points 0 points 0 points  What time? 0 points 0 points 0 points  Count back from 20 0 points 0 points 0 points  Months in reverse 0 points 0 points 0 points  Repeat phrase 0 points 0 points 0 points  Total Score 0 points 0 points 0 points    Immunizations Immunization History  Administered Date(s) Administered   Fluad Quad(high Dose 65+) 04/07/2021, 05/02/2022   Fluad Trivalent(High Dose 65+) 05/17/2023   Influenza Split 05/07/2012   Influenza,inj,Quad PF,6+ Mos 04/02/2013, 05/09/2014, 05/11/2015, 05/06/2016, 04/06/2017, 03/28/2018, 03/29/2019, 03/25/2020   Moderna Sars-Covid-2 Vaccination 09/27/2019, 10/30/2019, 05/20/2020   PFIZER Comirnaty(Gray Top)Covid-19 Tri-Sucrose Vaccine 02/18/2021   PNEUMOCOCCAL CONJUGATE-20 05/17/2023   Pfizer Covid-19 Vaccine Bivalent Booster 27yrs & up 05/26/2021   Pneumococcal Conjugate-13 10/02/2020   Pneumococcal Polysaccharide-23 08/20/2014   Tdap 03/12/2011, 05/12/2021   Unspecified SARS-COV-2 Vaccination 04/11/2022   Zoster Recombinant(Shingrix) 05/01/2020, 07/22/2020   Zoster, Live 10/09/2015    Screening Tests Health Maintenance  Topic Date Due   COVID-19 Vaccine (7 - 2024-25 season) 03/26/2023   Colonoscopy  09/24/2023   FOOT EXAM  11/15/2023    HEMOGLOBIN A1C  11/15/2023   OPHTHALMOLOGY EXAM  03/09/2024   Diabetic kidney evaluation - eGFR measurement  05/16/2024   Diabetic kidney evaluation - Urine ACR  05/16/2024   Medicare Annual Wellness (AWV)  09/19/2024   DTaP/Tdap/Td (3 - Td or Tdap) 05/13/2031   Pneumonia Vaccine 54+ Years old  Completed   INFLUENZA VACCINE  Completed   Hepatitis C Screening  Completed   Zoster Vaccines- Shingrix  Completed   HPV VACCINES  Aged Out    Health Maintenance  Health Maintenance Due  Topic Date Due   COVID-19 Vaccine (7 - 2024-25 season) 03/26/2023   Colonoscopy  09/24/2023   Health Maintenance Items Addressed: Referral sent to GI for colonoscopy  Additional Screening:  Vision Screening:  Recommended annual ophthalmology exams for early detection of glaucoma and other disorders of the eye.  Dental Screening: Recommended annual dental exams for proper oral hygiene  Community Resource Referral / Chronic Care Management: CRR required this visit?  No   CCM required this visit?  No     Plan:     I have personally reviewed and noted the following in the patient's chart:   Medical and social history Use of alcohol, tobacco or illicit drugs  Current medications and supplements including opioid prescriptions. Patient is not currently taking opioid prescriptions. Functional ability and status Nutritional status Physical activity Advanced directives List of other physicians Hospitalizations, surgeries, and ER visits in previous 12 months Vitals Screenings to include cognitive, depression, and falls Referrals and appointments  In addition, I have reviewed and discussed with patient certain preventive protocols, quality metrics, and best practice recommendations. A written personalized care plan for preventive services as well as general preventive health recommendations were provided to patient.     Hal Hope, LPN   [id number]   After Visit Summary: (MyChart) Due to this  being a telephonic visit, the after visit summary with patients personalized plan was offered to patient via MyChart   Notes:  REFERRAL SENT FOR COLONOSCOPY

## 2023-09-20 NOTE — Patient Instructions (Addendum)
 [name] , Thank you for taking time to come for your Medicare Wellness Visit. I appreciate your ongoing commitment to your health goals. Please review the following plan we discussed and let me know if I can assist you in the future.   Referrals/Orders/Follow-Ups/Clinician Recommendations: REFERRAL SENT FOR COLONOSCOPY  This is a list of the screening recommended for you and due dates:  Health Maintenance  Topic Date Due   COVID-19 Vaccine (7 - 2024-25 season) 03/26/2023   Colon Cancer Screening  09/24/2023   Complete foot exam   11/15/2023   Hemoglobin A1C  11/15/2023   Eye exam for diabetics  03/09/2024   Yearly kidney function blood test for diabetes  05/16/2024   Yearly kidney health urinalysis for diabetes  05/16/2024   Medicare Annual Wellness Visit  09/19/2024   DTaP/Tdap/Td vaccine (3 - Td or Tdap) 05/13/2031   Pneumonia Vaccine  Completed   Flu Shot  Completed   Hepatitis C Screening  Completed   Zoster (Shingles) Vaccine  Completed   HPV Vaccine  Aged Out    Advanced directives: (ACP Link)Information on Advanced Care Planning can be found at [facility] Advance Health Care Directives Advance Health Care Directives ([email or web address]   Next Medicare Annual Wellness Visit scheduled for next year: Yes   09/25/24 @ 8:10 AM BY PHONE

## 2023-09-27 DIAGNOSIS — H524 Presbyopia: Secondary | ICD-10-CM | POA: Diagnosis not present

## 2023-09-27 LAB — HM DIABETES EYE EXAM

## 2023-10-03 NOTE — Progress Notes (Deleted)
 HPI: FU coronary disease status post drug-eluting stent to the circumflex in August 2005. Patient admitted 2/17 with myocardial infarction. Cardiac catheterization February 2017 showed an occluded distal circumflex. The mid to distal LAD was occluded as well. Ejection fraction 40-50%. Patient was treated medically because of late presentation. Lower extremity Dopplers December 2020 showed occlusion of the popliteal artery on the right, occlusion of the peroneal artery on the right and no significant focal stenosis on the left; ABI moderate on the right.  Patient seen by Dr. Kirke Corin and walking program recommended.  If symptoms persisted angiography and intervention could be considered.  Abdominal ultrasound December 2021 showed no aneurysm.  Echocardiogram March 2024 showed normal LV function with apical hypokinesis, mild left ventricular hypertrophy, mild right ventricular enlargement.  Since I last saw him,   Current Outpatient Medications  Medication Sig Dispense Refill   acetaminophen (TYLENOL) 500 MG tablet Take 1,000 mg by mouth daily as needed for mild pain or headache.     aspirin EC 81 MG tablet Take 81 mg by mouth daily.     atorvastatin (LIPITOR) 80 MG tablet TAKE 1 TABLET(80 MG) BY MOUTH EVERY EVENING 90 tablet 3   calcium carbonate (TUMS - DOSED IN MG ELEMENTAL CALCIUM) 500 MG chewable tablet Chew 1 tablet by mouth daily as needed for indigestion or heartburn.     Dulaglutide (TRULICITY) 1.5 MG/0.5ML SOAJ Inject 1.5 mg into the skin once a week. 6 mL 1   lisinopril (ZESTRIL) 40 MG tablet Take 1 tablet (40 mg total) by mouth daily. 90 tablet 3   loratadine (CLARITIN) 10 MG tablet Take 10 mg by mouth as needed for allergies.     metFORMIN (GLUCOPHAGE) 1000 MG tablet Take 1 tablet (1,000 mg total) by mouth 2 (two) times daily with a meal. 180 tablet 1   metoprolol succinate (TOPROL-XL) 50 MG 24 hr tablet TAKE 1 TABLET(50 MG) BY MOUTH DAILY WITH OR IMMEDIATELY FOLLOWING A MEAL 90 tablet 3    nitroGLYCERIN (NITROSTAT) 0.4 MG SL tablet PLACE 1 TABLET UNDER THE TONGUE EVERY 5 (FIVE) MINUTES AS NEEDED FOR CHEST PAIN (UP TO 3 DOSES). 25 tablet 3   tamsulosin (FLOMAX) 0.4 MG CAPS capsule TAKE 1 CAPSULE BY MOUTH EVERY DAY AFTER SUPPER 90 capsule 0   XARELTO 2.5 MG TABS tablet TAKE 1 TABLET(2.5 MG) BY MOUTH TWICE DAILY 180 tablet 1   No current facility-administered medications for this visit.     Past Medical History:  Diagnosis Date   Arthritis    Ascending aorta dilatation (HCC)    a. Ascending aorta mildly dilated by echo 09/03/15.   Bladder cancer [facility] 2012   Mass removed, then BCG by urologist (Dr. Laverle Patter).  Recurrence documented on cysto + bladder washings 10/14/15--getting more maintenance BCG.  Cysto 01/20/16 OK, then more BCG given.  Cysto 04/13/16 c/w just having finished BCG but cytology/FISH ok.  Cysto 07/27/2016 c/w small area of BCG related inflamm, BCG stopped due to severe sx's. 12/2020 cysto benign   Bladder cancer (HCC)    Surveillance cysto 11/02/16 cysto showed 1.5 cm raised erythematous lesion->bx benign. Subsequent cystoscopies unchanged, most recent 07/2019 and 04/2021.   BPH (benign prostatic hyperplasia)    with hx of acute prostatitis   CAD (coronary artery disease)    a. '05 MI > DES to circumflex. b. NSTEMI 08/2015 with total occlusion of the mid LAD along with 20% mid-distal Cx, 100% dCX, 40% D1, 50% pCx, 50% m-dRCA, 80% dRCA.Given >12 hours  out from symptom onset, medical therapy recommended.   Diabetes mellitus type 2, controlled (HCC)    HbA1c 02/2011 was 6.6%   DVT (deep venous thrombosis) (HCC)    Elevated PSA 09/2011   with prostate nodule.  Prostate bx ALL BENIGN 10/10/11 (Dr. Marin Roberts followed by Urol--improved to 1.18 Jun 2017.  Nodule NOT palpable anymore on DRE at [facility] 01/2018 office f/u. Stable as of 08/2019. Slight incease 12/2020, urol to rpt 1 yr.   GERD (gastroesophageal reflux disease)    occasional   Headache(784.0)    cluster headaches a long  time ago   History of adenomatous polyp of colon 09/23/2020   recall 09/2025   History of bronchitis    History of vertigo    HTN (hypertension)    Hyperlipidemia    Ischemic cardiomyopathy    a. 2D Echo 09/03/15: mild LVH, EF 40-45%, diffuse HK, akinesis of apical myocardium, grade 1 DD, aortic root dimention 38mm, ascending aorta mildly dilated, mildly dilated RV/RA.    Myocardial infarction [facility]) 2005; 08/2015   followed by Dr. [name]   PAD (peripheral artery disease) (HCC) 06/2019   R LL abnl ABI and doppler->see PSH section of EMR for details.   Thrombocytopenia (HCC)    Stable 2012-2017   Tobacco abuse    Wolf-Parkinson-White syndrome     Past Surgical History:  Procedure Laterality Date   BLADDER TUMOR EXCISION  11/08/10; 05/2015   urothelial carcinoma (Dr. Laverle Patter).  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   CARDIAC CATHETERIZATION  2005   with stent placement   CARDIAC CATHETERIZATION N/A 09/01/2015   Procedure: Left Heart Cath and Coronary Angiography;  Surgeon: Lyn Records, MD;  Location: [facility] INVASIVE CV LAB;  Service: Cardiovascular;  Laterality: N/A;   COLONOSCOPY  2010; 09/23/20   2010 Dr. Gloriajean Dell. 09/2020 Dr. Hope Budds 09/2025.   CYSTOSCOPY W/ RETROGRADES Bilateral 06/22/2015   Procedure: CYSTOSCOPY WITH RETROGRADE PYELOGRAM;  Surgeon: Heloise Purpura, MD;  Location: [facility];  Service: Urology;  Laterality: Bilateral;   CYSTOSCOPY W/ RETROGRADES N/A 11/17/2016   Procedure: CYSTOSCOPY WITH RETROGRADE PYELOGRAM;  Surgeon: Heloise Purpura, MD;  Location: [facility];  Service: Urology;  Laterality: N/A;   CYSTOSCOPY WITH RETROGRADE PYELOGRAM, URETEROSCOPY AND STENT PLACEMENT N/A 06/06/2013   Procedure: CYSTOSCOPY WITH RETROGRADE PYELOGRAM;  Surgeon: Crecencio Mc, MD;  Location: [facility];  Service: Urology;  Laterality: N/A;   LE ABI vascular studies  06/2019   R LL claudication + evidence of PAD (ABI very low AND popliteal and  peroneal artery occlusion on dopler u/s) in R leg (L leg fine)->referred to Dr. Kirke Corin.  Conservative mgmt with walking program and low dose xarelto first, then consider revascularization.   lexiscan  2012   Normal/negative   LUMBAR SPINE SURGERY  1989 & 1991   Neurosurgeon in [place] ([place])-no trouble since last surgery   myocardial perfusion study  2009   Neg ischemia, EF 52%   TRANSTHORACIC ECHOCARDIOGRAM     09/2022 EF 50-55%, apical hypokinesis, indeterm diast fxn, AV sclerosis w/out stenosis.  Ascending aorta dilation to 38 mm   TRANSURETHRAL RESECTION OF BLADDER TUMOR N/A 06/06/2013   Procedure: TRANSURETHRAL RESECTION OF BLADDER TUMOR (TURBT);  Surgeon: Crecencio Mc, MD;  Location: [facility];  Service: Urology;  Laterality: N/A;.  PATH: squamous metaplasia, fibrosis with chronic inflammation, no malignancy.   TRANSURETHRAL RESECTION OF BLADDER TUMOR N/A 06/22/2015   Carcinoma in situ: no invasive malignancy.  Procedure: TRANSURETHRAL RESECTION OF BLADDER TUMOR (  TURBT);  Surgeon: Heloise Purpura, MD;  Location: [facility];  Service: Urology;  Laterality: N/A;  **BLADDER BIOPSY*RESECTION**   TRANSURETHRAL RESECTION OF BLADDER TUMOR N/A 11/17/2016   Path: BENIGN UROTHELIUM WITH INFLAMMATION--no malignancy. Procedure: TRANSURETHRAL RESECTION OF BLADDER TUMOR (TURBT);  Surgeon: Heloise Purpura, MD;  Location: [facility];  Service: Urology;  Laterality: N/A;   VASECTOMY      Social History   Socioeconomic History   Marital status: Married    Spouse name: Not on file   Number of children: Not on file   Years of education: Not on file   Highest education level: 12th grade  Occupational History   Not on file  Tobacco Use   Smoking status: Former    Current packs/day: 0.00    Average packs/day: 0.3 packs/day for 40.0 years (10.0 ttl pk-yrs)    Types: Cigarettes    Start date: 09/05/1975    Quit date: 09/05/2015    Years since quitting: 8.0   Smokeless tobacco: Never  Vaping Use   Vaping status: Never  Used  Substance and Sexual Activity   Alcohol use: No    Alcohol/week: 0.0 standard drinks of alcohol   Drug use: No   Sexual activity: Not on file  Other Topics Concern   Not on file  Social History Narrative   Married, 2 children, 4 grandchildren--[place].   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   Tob: 30 pack-yr hx, quit 08/2011.   No alcohol or drug use.   Active on farm but no formal exercise regimen.   Social Drivers of Corporate investment banker Strain: Low Risk  (09/20/2023)   Overall Financial Resource Strain (CARDIA)    Difficulty of Paying Living Expenses: Not hard at all  Food Insecurity: No Food Insecurity (09/20/2023)   Hunger Vital Sign    Worried About Running Out of Food in the Last Year: Never true    Ran Out of Food in the Last Year: Never true  Transportation Needs: No Transportation Needs (09/20/2023)   PRAPARE - Administrator, Civil Service (Medical): No    Lack of Transportation (Non-Medical): No  Physical Activity: Insufficiently Active (09/20/2023)   Exercise Vital Sign    Days of Exercise per Week: 5 days    Minutes of Exercise per Session: 20 min  Stress: No Stress Concern Present (09/20/2023)   [employer] of Occupational Health - Occupational Stress Questionnaire    Feeling of Stress : Only a little  Social Connections: Moderately Integrated (09/20/2023)   Social Connection and Isolation Panel [NHANES]    Frequency of Communication with Friends and Family: More than three times a week    Frequency of Social Gatherings with Friends and Family: More than three times a week    Attends Religious Services: 1 to 4 times per year    Active Member of [employer] or Organizations: No    Attends Banker Meetings: Never    Marital Status: Married  Catering manager Violence: Not At Risk (09/20/2023)   Humiliation, Afraid,  Rape, and Kick questionnaire    Fear of Current or Ex-Partner: No    Emotionally Abused: No    Physically Abused: No    Sexually Abused: No    Family History  Problem Relation Age of Onset   Hypertension Mother    Cancer Father  colon and prostate, mets to lungs.   Hypertension Father    Heart disease Brother        d. 14 MI   Multiple sclerosis Brother    Diabetes Brother    Heart disease Paternal Uncle    Colon cancer Neg Hx    Colon polyps Neg Hx    Esophageal cancer Neg Hx    Stomach cancer Neg Hx     ROS: no fevers or chills, productive cough, hemoptysis, dysphasia, odynophagia, melena, hematochezia, dysuria, hematuria, rash, seizure activity, orthopnea, PND, pedal edema, claudication. Remaining systems are negative.  Physical Exam: Well-developed well-nourished in no acute distress.  Skin is warm and dry.  HEENT is normal.  Neck is supple.  Chest is clear to auscultation with normal expansion.  Cardiovascular exam is regular rate and rhythm.  Abdominal exam nontender or distended. No masses palpated. Extremities show no edema. neuro grossly intact  ECG- personally reviewed  A/P  1 coronary artery disease-patient doing well with no chest pain.  Continue aspirin and statin.  2 ischemic cardiomyopathy-LV function preserved on most recent echocardiogram.  Continue Toprol and ACE inhibitor.  Previously could not afford Entresto.  3 hypertension-blood pressure controlled.  Continue present medical regimen.  4 hyperlipidemia-continue statin.  5 peripheral vascular disease-managed by Dr. Kirke Corin.  Olga Millers, MD

## 2023-10-08 ENCOUNTER — Other Ambulatory Visit: Payer: Self-pay | Admitting: Family Medicine

## 2023-10-17 ENCOUNTER — Ambulatory Visit: Payer: Medicare Other | Admitting: Cardiology

## 2023-10-18 ENCOUNTER — Encounter: Payer: Self-pay | Admitting: Physician Assistant

## 2023-10-22 NOTE — Progress Notes (Unsigned)
 Cardiology Office Note    Date:  10/25/2023  ID:  Mark Blankenship, DOB 1956-07-09, MRN [id number] PCP:  Mark Massed, MD  Cardiologist:  Mark Millers, MD  Electrophysiologist:  None   Chief Complaint: Follow up for CAD   History of Present Illness: .    Mark Blankenship is a 68 y.o. male with visit-pertinent history of CAD with a late presenting MI in 2017, ischemic cardiomyopathy with a EF of 40 to 45%, type 2 diabetes, hypertension and hyperlipidemia and PAD.  In 2005 patient underwent LHC with DES to the circumflex.  Patient was admitted again in 08/2015 with MI, cardiac catheterization at that time showed an occluded distal circumflex, mid to distal LAD was occluded as well.  EF 40-50%.  Patient was treated medically because of late presentation.  Lower extremity Dopplers in December 2020 showed occlusion of the popliteal artery on the right, occlusion of the peroneal artery on the right and no significant focal stenosis on the left.  ABI moderate on the right.  Patient seen by Mark Blankenship and walking program recommended.  Abdominal ultrasound in December 2021 showed no aneurysm.  Echo in March 2024 showed normal LV function with apical hypokinesis, mild LVH, mild right ventricular enlargement.  Patient was seen in clinic by Mark Blankenship in 05/2023.  He remained stable from a cardiac standpoint.  Today patient presents for follow-up.  He reports that he is doing very well.  He denies any chest pain, shortness of breath, lower extremity edema, orthopnea or PND.  He denies any palpitations, presyncope or syncope.  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  Patient notes that he did have some blood in his urine late last week and had some lower back pain, he is following up with urology today.  ROS: .   Today he denies chest pain, shortness of breath, lower extremity edema, fatigue, palpitations, melena, hematuria, hemoptysis, diaphoresis,  weakness, presyncope, syncope, orthopnea, and PND.  All other systems are reviewed and otherwise negative. Studies Reviewed: Marland Kitchen    EKG:  EKG is not ordered today.  CV Studies: Cardiac studies reviewed are outlined and summarized above. Otherwise please see EMR for full report. Cardiac Studies & Procedures   ______________________________________________________________________________________________ CARDIAC CATHETERIZATION  CARDIAC CATHETERIZATION 09/01/2015  Narrative 1. Mid Cx to Dist Cx lesion, 20% stenosed. The lesion was previously treated with a stent (unknown type). 2. Dist Cx lesion, 100% stenosed. 3. 1st Diag lesion, 40% stenosed. 4. Prox Cx lesion, 50% stenosed. 5. Mid RCA to Dist RCA lesion, 50% stenosed. 6. Dist RCA lesion, 80% stenosed. 7. Mid LAD to Dist LAD lesion, 100% stenosed.   Total occlusion of the mid LAD. At the time of catheterization the patient was totally asymptomatic and being 12 hours post symptom onset PCI was not felt to be indicated.  70% stenosis in the distal left ventricular branch of the right coronary.  100% stenosis of the third obtuse marginal branch beyond the previously placed circumflex stent. Widely patent circumflex stent.  Irregularities are noted in the proximal and mid LAD as well as the first diagonal. The left main is widely patent.  Left ventriculography demonstrated mild to moderate anterior hypokinesis and apical akinesis. Estimated ejection fraction 40-50%.  RECOMMENDATIONS:   Manage as a late presenting acute myocardial infarction.  Dual antiplatelet therapy. Statin therapy. Beta blocker therapy. ACE inhibitor therapy.  Reassess LV function in 48 hours.  Care with fluid administration to avoid  heart failure.  Findings Coronary Findings Diagnostic  Dominance: Right  Left Anterior Descending Diffuse with heavy thrombus.  First Diagonal Branch  Second Diagonal Branch The vessel is small in size. Collaterals 2nd  Diag filled by collaterals from 1st Diag.  Second Septal Branch The vessel is small in size.  Left Circumflex . Vessel is small. Eccentric. The lesion is type C. The lesion was previously treated with a stent (unknown type).  First Obtuse Marginal Branch The vessel is small in size.  Second Obtuse Marginal Branch The vessel is small in size.  Right Coronary Artery  First Right Posterolateral Branch The vessel is small in size.  Second Right Posterolateral Branch The vessel is small in size.  Intervention  No interventions have been documented.     ECHOCARDIOGRAM  ECHOCARDIOGRAM COMPLETE 10/17/2022  Narrative ECHOCARDIOGRAM REPORT    [name] Date of Exam: 10/17/2022 Medical Rec #:  [id number]          Height:       66.0 in Accession #:    [id number]         Weight:       175.6 lb Date of Birth:  June 16, 1956         BSA:          1.892 m Patient Age:    68 years           BP:           120/60 mmHg Patient Gender: M                  HR:           66 bpm. Exam Location:  Mark Blankenship  Procedure: 2D Echo, 3D Echo, Cardiac Doppler, Color Doppler and Strain Analysis  Indications:    I25.5 Ischemic Cardiomyopathy  History:        Patient has prior history of Echocardiogram examinations, most recent 09/03/2015. CAD and Previous Myocardial Infarction, PAD; Risk Factors:Hypertension, Diabetes, Dyslipidemia, Family History of Coronary Artery Disease and Former Smoker. Mark Blankenship, Bladder Cancer, Ischemic Cardiomyopathy (40-45%).  Sonographer:    Mark Blankenship RDCS Referring Phys: Mark Blankenship CRENSHAW  IMPRESSIONS   1. Left ventricular ejection fraction, by estimation, is 50 to 55%. The left ventricle has low normal function. The left ventricle demonstrates regional wall motion abnormalities. Apical hypokinesis. There is mild left ventricular hypertrophy. Left ventricular diastolic parameters are indeterminate. The average  left ventricular global longitudinal strain is -18.7 %. 2. Right ventricular systolic function is normal. The right ventricular size is mildly enlarged. Tricuspid regurgitation signal is inadequate for assessing PA pressure. 3. The mitral valve is normal in structure. Trivial mitral valve regurgitation. No evidence of mitral stenosis. 4. The aortic valve is tricuspid. Aortic valve regurgitation is not visualized. Aortic valve sclerosis/calcification is present, without any evidence of aortic stenosis. 5. Aortic dilatation noted. There is mild dilatation of the ascending aorta, measuring 38 mm. 6. The inferior vena cava is normal in size with greater than 50% respiratory variability, suggesting right atrial pressure of 3 mmHg.  FINDINGS Left Ventricle: Left ventricular ejection fraction, by estimation, is 50 to 55%. The left ventricle has low normal function. The left ventricle demonstrates regional wall motion abnormalities. The average left ventricular global longitudinal strain is -18.7 %. 3D ejection fraction reviewed and evaluated as part of the interpretation. Alternate measurement of EF is felt to be most reflective of LV function. The left ventricular internal cavity size  was normal in size. There is mild left ventricular hypertrophy. Left ventricular diastolic parameters are indeterminate.  Right Ventricle: The right ventricular size is mildly enlarged. No increase in right ventricular wall thickness. Right ventricular systolic function is normal. Tricuspid regurgitation signal is inadequate for assessing PA pressure.  Left Atrium: Left atrial size was normal in size.  Right Atrium: Right atrial size was normal in size.  Pericardium: There is no evidence of pericardial effusion.  Mitral Valve: The mitral valve is normal in structure. Trivial mitral valve regurgitation. No evidence of mitral valve stenosis.  Tricuspid Valve: The tricuspid valve is normal in structure. Tricuspid valve  regurgitation is trivial.  Aortic Valve: The aortic valve is tricuspid. Aortic valve regurgitation is not visualized. Aortic valve sclerosis/calcification is present, without any evidence of aortic stenosis.  Pulmonic Valve: The pulmonic valve was not well visualized. Pulmonic valve regurgitation is not visualized.  Aorta: The aortic root is normal in size and structure and aortic dilatation noted. There is mild dilatation of the ascending aorta, measuring 38 mm.  Venous: The inferior vena cava is normal in size with greater than 50% respiratory variability, suggesting right atrial pressure of 3 mmHg.  IAS/Shunts: The interatrial septum was not well visualized.   LEFT VENTRICLE PLAX 2D LVIDd:         4.50 cm   Diastology LVIDs:         2.40 cm   LV e' medial:    6.69 cm/s LV PW:         0.90 cm   LV E/e' medial:  8.8 LV IVS:        1.10 cm   LV e' lateral:   7.83 cm/s LVOT diam:     2.20 cm   LV E/e' lateral: 7.5 LV SV:         73 LV SV Index:   39        2D Longitudinal Strain LVOT Area:     3.80 cm  2D Strain GLS (A2C):   -19.9 % 2D Strain GLS (A3C):   -17.2 % 2D Strain GLS (A4C):   -19.1 % 2D Strain GLS Avg:     -18.7 %  3D Volume EF: 3D EF:        59 % LV EDV:       166 ml LV ESV:       67 ml LV SV:        98 ml  RIGHT VENTRICLE RV Basal diam:  4.20 cm RV Mid diam:    3.10 cm RV S prime:     13.70 cm/s TAPSE (M-mode): 2.1 cm  LEFT ATRIUM             Index        RIGHT ATRIUM           Index LA diam:        4.30 cm 2.27 cm/m   RA Pressure: 3.00 mmHg LA Vol (A2C):   32.3 ml 17.07 ml/m  RA Area:     14.70 cm LA Vol (A4C):   39.0 ml 20.61 ml/m  RA Volume:   38.30 ml  20.24 ml/m LA Biplane Vol: 36.5 ml 19.29 ml/m AORTIC VALVE LVOT Vmax:   95.13 cm/s LVOT Vmean:  61.367 cm/s LVOT VTI:    0.193 m  AORTA Ao Root diam: 3.60 cm Ao Asc diam:  3.80 cm  MITRAL VALVE  TRICUSPID VALVE MV Area (PHT): cm         Estimated RAP:  3.00 mmHg MV Decel Time:  218 msec MV E velocity: 58.70 cm/s  SHUNTS MV A velocity: 66.30 cm/s  Systemic VTI:  0.19 m MV E/A ratio:  0.89        Systemic Diam: 2.20 cm  Epifanio Lesches MD Electronically signed by Epifanio Lesches MD Signature Date/Time: 10/17/2022/1:23:12 PM    Final          ______________________________________________________________________________________________       Current Reported Medications:.    Current Meds  Medication Sig   acetaminophen (TYLENOL) 500 MG tablet Take 1,000 mg by mouth daily as needed for mild pain or headache.   aspirin EC 81 MG tablet Take 81 mg by mouth daily.   atorvastatin (LIPITOR) 80 MG tablet TAKE 1 TABLET(80 MG) BY MOUTH EVERY EVENING   calcium carbonate (TUMS - DOSED IN MG ELEMENTAL CALCIUM) 500 MG chewable tablet Chew 1 tablet by mouth daily as needed for indigestion or heartburn.   Cetirizine HCl (ZYRTEC ALLERGY PO) Take by mouth.   Dulaglutide (TRULICITY) 1.5 MG/0.5ML SOAJ Inject 1.5 mg into the skin once a week.   lisinopril (ZESTRIL) 40 MG tablet Take 1 tablet (40 mg total) by mouth daily.   metFORMIN (GLUCOPHAGE) 1000 MG tablet Take 1 tablet (1,000 mg total) by mouth 2 (two) times daily with a meal.   metoprolol succinate (TOPROL-XL) 50 MG 24 hr tablet TAKE 1 TABLET(50 MG) BY MOUTH DAILY WITH OR IMMEDIATELY FOLLOWING A MEAL   tamsulosin (FLOMAX) 0.4 MG CAPS capsule TAKE 1 CAPSULE BY MOUTH EVERY DAY AFTER SUPPER   XARELTO 2.5 MG TABS tablet TAKE 1 TABLET(2.5 MG) BY MOUTH TWICE DAILY   [DISCONTINUED] nitroGLYCERIN (NITROSTAT) 0.4 MG SL tablet PLACE 1 TABLET UNDER THE TONGUE EVERY 5 (FIVE) MINUTES AS NEEDED FOR CHEST PAIN (UP TO 3 DOSES).    Physical Exam:    VS:  BP 118/76 (BP Location: Left Arm, Patient Position: Sitting, Cuff Size: Normal)   Pulse 72   Ht 5\' 6"  (1.676 m)   Wt 170 lb 12.8 oz (77.5 kg)   SpO2 96%   BMI 27.57 kg/m    Wt Readings from Last 3 Encounters:  10/24/23 170 lb 12.8 oz (77.5 kg)  06/13/23 165 lb 9.6  oz (75.1 kg)  05/17/23 163 lb (73.9 kg)    GEN: Well nourished, well developed in no acute distress NECK: No JVD; No carotid bruits CARDIAC: RRR, no murmurs, rubs, gallops RESPIRATORY:  Clear to auscultation without rales, wheezing or rhonchi  ABDOMEN: Soft, non-tender, non-distended EXTREMITIES:  No edema; No acute deformity     Asessement and Plan:.    CAD: S/p DES to the circumflex in 2005.  Admitted in 2017 with MI, cardiac catheterization at the time showed an occluded distal circumflex, mid to distal LAD occlusion, EF 40 to 50%.  Patient treated medically because of late presentation. Stable with no anginal symptoms. No indication for ischemic evaluation.  Heart healthy diet and regular cardiovascular exercise encouraged. Reviewed ED precautions. Continue aspirin 81 mg daily, Lipitor 80 mg daily, lisinopril 40 mg daily, metoprolol succinate 50 mg daily, Xarelto 2.5 mg twice daily.  Sublingual nitroglycerin as needed, refill provided, patient notes his prescription had expired.  Ischemic cardiomyopathy: EF 40-50% at time of late presenting MI in 2017, cardiac catheterization indicating an occluded distal circumflex and mid to distal LAD, treated medically.  Echo in March 2024 showed normal LV function  with apical hypokinesis. Today he appears euvolemic and well compensated on exam.  Continue metoprolol succinate 50 mg daily and lisinopril 40 mg daily. Check CBC and BMET.   HTN: Blood pressure today 118/76.  Continue current antihypertensive regimen.  HLD: Last lipid profile on 05/17/2023 indicates total cholesterol 97, HDL 29.8, triglycerides 110 and LDL 45.  Continue atorvastatin 80 mg daily.  PAD: Followed by Mark Blankenship.  Patient denies any intermittent claudication.  He is on long-term treatment with low-dose Xarelto, tolerating well.   Disposition: F/u with Dr. [name] in one year or sooner if needed.   Signed, Rip Harbour, NP

## 2023-10-24 ENCOUNTER — Encounter: Payer: Self-pay | Admitting: *Deleted

## 2023-10-24 ENCOUNTER — Ambulatory Visit: Attending: Cardiology | Admitting: Cardiology

## 2023-10-24 ENCOUNTER — Encounter: Payer: Self-pay | Admitting: Cardiology

## 2023-10-24 VITALS — BP 118/76 | HR 72 | Ht 66.0 in | Wt 170.8 lb

## 2023-10-24 DIAGNOSIS — I10 Essential (primary) hypertension: Secondary | ICD-10-CM | POA: Diagnosis not present

## 2023-10-24 DIAGNOSIS — I255 Ischemic cardiomyopathy: Secondary | ICD-10-CM | POA: Diagnosis not present

## 2023-10-24 DIAGNOSIS — I2510 Atherosclerotic heart disease of native coronary artery without angina pectoris: Secondary | ICD-10-CM | POA: Diagnosis not present

## 2023-10-24 DIAGNOSIS — I739 Peripheral vascular disease, unspecified: Secondary | ICD-10-CM

## 2023-10-24 DIAGNOSIS — R8289 Other abnormal findings on cytological and histological examination of urine: Secondary | ICD-10-CM | POA: Diagnosis not present

## 2023-10-24 DIAGNOSIS — E785 Hyperlipidemia, unspecified: Secondary | ICD-10-CM

## 2023-10-24 DIAGNOSIS — R310 Gross hematuria: Secondary | ICD-10-CM | POA: Diagnosis not present

## 2023-10-24 DIAGNOSIS — Z8551 Personal history of malignant neoplasm of bladder: Secondary | ICD-10-CM | POA: Diagnosis not present

## 2023-10-24 MED ORDER — NITROGLYCERIN 0.4 MG SL SUBL: SUBLINGUAL_TABLET | SUBLINGUAL | 3 refills | Status: AC

## 2023-10-24 NOTE — Patient Instructions (Signed)
 Medication Instructions:  No Changes  Lab Work: Today: CBC and BMET  If you have labs (blood work) drawn today and your tests are completely normal, you will receive your results only by: MyChart Message (if you have MyChart) OR A paper copy in the mail If you have any lab test that is abnormal or we need to change your treatment, we will call you to review the results.   Follow-Up: At [facility], you and your health needs are our priority.  As part of our continuing mission to provide you with exceptional heart care, our providers are all part of one team.  This team includes your primary Cardiologist (physician) and Advanced Practice Providers or APPs (Physician Assistants and Nurse Practitioners) who all work together to provide you with the care you need, when you need it.  Your next appointment:   1 year(s) (We will mail a reminder letter; please call in mid January 2026 for early April 2026 appointment)  Provider:   Olga Millers, MD    We recommend signing up for the patient portal called "MyChart".  Sign up information is provided on this After Visit Summary.  MyChart is used to connect with patients for Virtual Visits (Telemedicine).  Patients are able to view lab/test results, encounter notes, upcoming appointments, etc.  Non-urgent messages can be sent to your provider as well.   To learn more about what you can do with MyChart, go to [email or web address].   Other Instructions Please call us or send a MyChart message with any Cardiology related concerns.  [phone]       Valet parking services will be available as well.

## 2023-10-25 ENCOUNTER — Encounter: Payer: Self-pay | Admitting: Cardiology

## 2023-10-25 LAB — BASIC METABOLIC PANEL WITH GFR
BUN/Creatinine Ratio: 13 (ref 10–24)
BUN: 15 mg/dL (ref 8–27)
CO2: 23 mmol/L (ref 20–29)
Calcium: 9.5 mg/dL (ref 8.6–10.2)
Chloride: 98 mmol/L (ref 96–106)
Creatinine, Ser: 1.16 mg/dL (ref 0.76–1.27)
Glucose: 141 mg/dL — ABNORMAL HIGH (ref 70–99)
Potassium: 5 mmol/L (ref 3.5–5.2)
Sodium: 138 mmol/L (ref 134–144)
eGFR: 69 mL/min/{1.73_m2} (ref >59)

## 2023-10-25 LAB — CBC
Hematocrit: 46.7 % (ref 37.5–51.0)
Hemoglobin: 15.9 g/dL (ref 13.0–17.7)
MCH: 31.7 pg (ref 26.6–33.0)
MCHC: 34 g/dL (ref 31.5–35.7)
MCV: 93 fL (ref 79–97)
Platelets: 137 10*3/uL — ABNORMAL LOW (ref 150–450)
RBC: 5.02 x10E6/uL (ref 4.14–5.80)
RDW: 13.1 % (ref 11.6–15.4)
WBC: 8.3 10*3/uL (ref 3.4–10.8)

## 2023-10-29 ENCOUNTER — Other Ambulatory Visit: Payer: Self-pay | Admitting: Family Medicine

## 2023-11-13 DIAGNOSIS — R310 Gross hematuria: Secondary | ICD-10-CM | POA: Diagnosis not present

## 2023-11-13 DIAGNOSIS — K8020 Calculus of gallbladder without cholecystitis without obstruction: Secondary | ICD-10-CM | POA: Diagnosis not present

## 2023-11-13 DIAGNOSIS — K429 Umbilical hernia without obstruction or gangrene: Secondary | ICD-10-CM | POA: Diagnosis not present

## 2023-11-13 DIAGNOSIS — N400 Enlarged prostate without lower urinary tract symptoms: Secondary | ICD-10-CM | POA: Diagnosis not present

## 2023-11-13 DIAGNOSIS — C678 Malignant neoplasm of overlapping sites of bladder: Secondary | ICD-10-CM | POA: Diagnosis not present

## 2023-11-15 ENCOUNTER — Ambulatory Visit (INDEPENDENT_AMBULATORY_CARE_PROVIDER_SITE_OTHER): Payer: Medicare Other | Admitting: Family Medicine

## 2023-11-15 ENCOUNTER — Encounter: Payer: Self-pay | Admitting: *Deleted

## 2023-11-15 ENCOUNTER — Encounter: Payer: Self-pay | Admitting: Family Medicine

## 2023-11-15 VITALS — BP 125/78 | HR 69 | Temp 97.7°F | Ht 67.0 in | Wt 169.6 lb

## 2023-11-15 DIAGNOSIS — E7800 Pure hypercholesterolemia, unspecified: Secondary | ICD-10-CM | POA: Diagnosis not present

## 2023-11-15 DIAGNOSIS — I10 Essential (primary) hypertension: Secondary | ICD-10-CM | POA: Diagnosis not present

## 2023-11-15 DIAGNOSIS — E119 Type 2 diabetes mellitus without complications: Secondary | ICD-10-CM | POA: Diagnosis not present

## 2023-11-15 DIAGNOSIS — Z0000 Encounter for general adult medical examination without abnormal findings: Secondary | ICD-10-CM

## 2023-11-15 DIAGNOSIS — Z7984 Long term (current) use of oral hypoglycemic drugs: Secondary | ICD-10-CM | POA: Diagnosis not present

## 2023-11-15 LAB — HEPATIC FUNCTION PANEL
ALT: 14 U/L (ref 0–53)
AST: 17 U/L (ref 0–37)
Albumin: 4.4 g/dL (ref 3.5–5.2)
Alkaline Phosphatase: 74 U/L (ref 39–117)
Bilirubin, Direct: 0.3 mg/dL (ref 0.0–0.3)
Total Bilirubin: 1.2 mg/dL (ref 0.2–1.2)
Total Protein: 6.8 g/dL (ref 6.0–8.3)

## 2023-11-15 LAB — LIPID PANEL
Cholesterol: 88 mg/dL (ref 0–200)
HDL: 26.8 mg/dL — ABNORMAL LOW (ref >39.00)
LDL Cholesterol: 38 mg/dL (ref 0–99)
NonHDL: 61.34
Total CHOL/HDL Ratio: 3
Triglycerides: 115 mg/dL (ref 0.0–149.0)
VLDL: 23 mg/dL (ref 0.0–40.0)

## 2023-11-15 LAB — POCT GLYCOSYLATED HEMOGLOBIN (HGB A1C)
HbA1c POC (<> result, manual entry): 7.3 % (ref 4.0–5.6)
HbA1c, POC (controlled diabetic range): 7.3 % — AB (ref 0.0–7.0)
HbA1c, POC (prediabetic range): 7.3 % — AB (ref 5.7–6.4)
Hemoglobin A1C: 7.3 % — AB (ref 4.0–5.6)

## 2023-11-15 MED ORDER — TAMSULOSIN HCL 0.4 MG PO CAPS: ORAL_CAPSULE | ORAL | 3 refills | Status: AC

## 2023-11-15 MED ORDER — TRULICITY 1.5 MG/0.5ML ~~LOC~~ SOAJ: 1.5000 mg | SUBCUTANEOUS | 5 refills | Status: DC

## 2023-11-15 MED ORDER — METFORMIN HCL 1000 MG PO TABS: 1000.0000 mg | ORAL_TABLET | Freq: Two times a day (BID) | ORAL | 3 refills | Status: AC

## 2023-11-15 NOTE — Progress Notes (Signed)
 Office Note 11/15/2023  CC:  Chief Complaint  Patient presents with   Annual Exam    Pt is fasting   Patient is a 68 y.o. male who is here for annual health maintenance exam and 3-month follow-up diabetes, hypertension, and hypercholesterolemia. A/P as of last visit: "1 diabetes without complication, good control. POC Hba1c 6.6% today--> improving. Continue trulicity  1.5 mg subcu weekly. Continue metformin  1000 mg twice a day. Electrolytes and renal function today.   2.  Hypertension, doing well on lisinopril  40 mg a day and Toprol -XL 50 mg a day. Electrolytes and creatinine today.   #3 hypercholesterolemia. LDL goal less than 70.  Last LDL was 37 about 6 months ago. Continue atorvastatin  80 mg daily. Check lipids and hepatic panel today."  INTERIM HX: No acute concerns.  He has had allergic rhinitis symptoms lately as per his usual in the spring. He did have a COVID infection 2 months ago, which she feels like all symptoms have resolved now.  Denies any pain/burning, tingling, or numbness in feet.   Past Medical History:  Diagnosis Date   Arthritis    Ascending aorta dilatation (HCC)    a. Ascending aorta mildly dilated by echo 09/03/15.   Bladder cancer [facility]) 2012   Mass removed, then BCG by urologist (Dr. Rozanne Corners).  Recurrence documented on cysto + bladder washings 10/14/15--getting more maintenance BCG.  Cysto 01/20/16 OK, then more BCG given.  Cysto 04/13/16 c/w just having finished BCG but cytology/FISH ok.  Cysto 07/27/2016 c/w small area of BCG related inflamm, BCG stopped due to severe sx's. 12/2020 cysto benign   Bladder cancer (HCC)    Surveillance cysto 11/02/16 cysto showed 1.5 cm raised erythematous lesion->bx benign. Subsequent cystoscopies unchanged, most recent 07/2019 and 04/2021.   BPH (benign prostatic hyperplasia)    with hx of acute prostatitis   CAD (coronary artery disease)    a. '05 MI > DES to circumflex. b. NSTEMI 08/2015 with total occlusion of the mid  LAD along with 20% mid-distal Cx, 100% dCX, 40% D1, 50% pCx, 50% m-dRCA, 80% dRCA.Given >12 hours out from symptom onset, medical therapy recommended.   Diabetes mellitus type 2, controlled (HCC)    HbA1c 02/2011 was 6.6%   DVT (deep venous thrombosis) (HCC)    Elevated PSA 09/2011   with prostate nodule.  Prostate bx ALL BENIGN 10/10/11 (Dr. Haven Lion followed by Urol--improved to 1.18 Jun 2017.  Nodule NOT palpable anymore on DRE at [facility] 01/2018 office f/u. Stable as of 08/2019. Slight incease 12/2020, urol to rpt 1 yr.   GERD (gastroesophageal reflux disease)    occasional   Headache(784.0)    cluster headaches a long time ago   History of adenomatous polyp of colon 09/23/2020   recall 09/2025   History of bronchitis    History of vertigo    HTN (hypertension)    Hyperlipidemia    Ischemic cardiomyopathy    a. 2D Echo 09/03/15: mild LVH, EF 40-45%, diffuse HK, akinesis of apical myocardium, grade 1 DD, aortic root dimention 38mm, ascending aorta mildly dilated, mildly dilated RV/RA.    Myocardial infarction [facility]) 2005; 08/2015   followed by Dr. Audery Blazing   PAD (peripheral artery disease) (HCC) 06/2019   R LL abnl ABI and doppler->see PSH section of EMR for details.   Thrombocytopenia (HCC)    Stable 2012-2017   Tobacco abuse    Wolf-Parkinson-White syndrome     Past Surgical History:  Procedure Laterality Date   BLADDER TUMOR EXCISION  11/08/10; 05/2015   urothelial carcinoma (Dr. Rozanne Corners).  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   CARDIAC CATHETERIZATION  2005   with stent placement   CARDIAC CATHETERIZATION N/A 09/01/2015   Procedure: Left Heart Cath and Coronary Angiography;  Surgeon: Arty Binning, MD;  Location: [facility] LAB;  Service: Cardiovascular;  Laterality: N/A;   COLONOSCOPY  2010; 09/23/20   2010 Dr. Frankey Isle. 09/2020 Dr. Basilia Lima 09/2025.   CYSTOSCOPY W/ RETROGRADES Bilateral 06/22/2015   Procedure:  CYSTOSCOPY WITH RETROGRADE PYELOGRAM;  Surgeon: Florencio Hunting, MD;  Location: [facility];  Service: Urology;  Laterality: Bilateral;   CYSTOSCOPY W/ RETROGRADES N/A 11/17/2016   Procedure: CYSTOSCOPY WITH RETROGRADE PYELOGRAM;  Surgeon: Florencio Hunting, MD;  Location: [facility];  Service: Urology;  Laterality: N/A;   CYSTOSCOPY WITH RETROGRADE PYELOGRAM, URETEROSCOPY AND STENT PLACEMENT N/A 06/06/2013   Procedure: CYSTOSCOPY WITH RETROGRADE PYELOGRAM;  Surgeon: Kristeen Peto, MD;  Location: [facility];  Service: Urology;  Laterality: N/A;   LE ABI vascular studies  06/2019   R LL claudication + evidence of PAD (ABI very low AND popliteal and peroneal artery occlusion on dopler u/s) in R leg (L leg fine)->referred to Dr. Alvenia Aus.  Conservative mgmt with walking program and low dose xarelto  first, then consider revascularization.   lexiscan   2012   Normal/negative   LUMBAR SPINE SURGERY  1989 & 1991   Neurosurgeon in [place] ([facility])-no trouble since last surgery   myocardial perfusion study  2009   Neg ischemia, EF 52%   TRANSTHORACIC ECHOCARDIOGRAM     09/2022 EF 50-55%, apical hypokinesis, indeterm diast fxn, AV sclerosis w/out stenosis.  Ascending aorta dilation to 38 mm   TRANSURETHRAL RESECTION OF BLADDER TUMOR N/A 06/06/2013   Procedure: TRANSURETHRAL RESECTION OF BLADDER TUMOR (TURBT);  Surgeon: Kristeen Peto, MD;  Location: [facility];  Service: Urology;  Laterality: N/A;.  PATH: squamous metaplasia, fibrosis with chronic inflammation, no malignancy.   TRANSURETHRAL RESECTION OF BLADDER TUMOR N/A 06/22/2015   Carcinoma in situ: no invasive malignancy.  Procedure: TRANSURETHRAL RESECTION OF BLADDER TUMOR (TURBT);  Surgeon: Florencio Hunting, MD;  Location: [facility];  Service: Urology;  Laterality: N/A;  **BLADDER BIOPSY*RESECTION**   TRANSURETHRAL RESECTION OF BLADDER TUMOR N/A 11/17/2016   Path: BENIGN UROTHELIUM WITH INFLAMMATION--no malignancy. Procedure: TRANSURETHRAL RESECTION OF BLADDER TUMOR (TURBT);  Surgeon: Florencio Hunting, MD;  Location: [facility];  Service: Urology;  Laterality: N/A;   VASECTOMY      Family History  Problem Relation Age of Onset   Hypertension Mother    Cancer Father        colon and prostate, mets to lungs.   Hypertension Father    Heart disease Brother        d. 37 MI   Multiple sclerosis Brother    Diabetes Brother    Heart disease Paternal Uncle    Colon cancer Neg Hx    Colon polyps Neg Hx    Esophageal cancer Neg Hx    Stomach cancer Neg Hx     Social History   Socioeconomic History   Marital status: Married    Spouse name: Not on file   Number of children: Not on file   Years of education: Not on file   Highest education level: 12th grade  Occupational History   Not on file  Tobacco Use   Smoking status: Former    Current packs/day: 0.00    Average packs/day: 0.3 packs/day for 40.0 years (10.0 ttl  pk-yrs)    Types: Cigarettes    Start date: 09/05/1975    Quit date: 09/05/2015    Years since quitting: 8.2   Smokeless tobacco: Never  Vaping Use   Vaping status: Never Used  Substance and Sexual Activity   Alcohol use: No    Alcohol/week: 0.0 standard drinks of alcohol   Drug use: No   Sexual activity: Not on file  Other Topics Concern   Not on file  Social History Narrative   Married, 2 children, 4 grandchildren--[place].   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   Tob: 30 pack-yr hx, quit 08/2011.   No alcohol or drug use.   Active on farm but no formal exercise regimen.   Social Drivers of Corporate investment banker Strain: Low Risk  (09/20/2023)   Overall Financial Resource Strain (CARDIA)    Difficulty of Paying Living Expenses: Not hard at all  Food Insecurity: No Food Insecurity (09/20/2023)   Hunger Vital Sign    Worried About Running Out of Food in the Last Year: Never true    Ran Out of Food in the Last Year: Never true   Transportation Needs: No Transportation Needs (09/20/2023)   PRAPARE - Administrator, Civil Service (Medical): No    Lack of Transportation (Non-Medical): No  Physical Activity: Insufficiently Active (09/20/2023)   Exercise Vital Sign    Days of Exercise per Week: 5 days    Minutes of Exercise per Session: 20 min  Stress: No Stress Concern Present (09/20/2023)   [employer] of Occupational Health - Occupational Stress Questionnaire    Feeling of Stress : Only a little  Social Connections: Moderately Integrated (09/20/2023)   Social Connection and Isolation Panel [NHANES]    Frequency of Communication with Friends and Family: More than three times a week    Frequency of Social Gatherings with Friends and Family: More than three times a week    Attends Religious Services: 1 to 4 times per year    Active Member of [employer] or Organizations: No    Attends Banker Meetings: Never    Marital Status: Married  Catering manager Violence: Not At Risk (09/20/2023)   Humiliation, Afraid, Rape, and Kick questionnaire    Fear of Current or Ex-Partner: No    Emotionally Abused: No    Physically Abused: No    Sexually Abused: No    Outpatient Medications Prior to Visit  Medication Sig Dispense Refill   acetaminophen  (TYLENOL ) 500 MG tablet Take 1,000 mg by mouth daily as needed for mild pain or headache.     aspirin  EC 81 MG tablet Take 81 mg by mouth daily.     atorvastatin  (LIPITOR ) 80 MG tablet TAKE 1 TABLET(80 MG) BY MOUTH EVERY EVENING 90 tablet 3   calcium  carbonate (TUMS - DOSED IN MG ELEMENTAL CALCIUM ) 500 MG chewable tablet Chew 1 tablet by mouth daily as needed for indigestion or heartburn.     Cetirizine HCl (ZYRTEC ALLERGY PO) Take by mouth.     lisinopril  (ZESTRIL ) 40 MG tablet Take 1 tablet (40 mg total) by mouth daily. 90 tablet 3   loratadine (CLARITIN) 10 MG tablet Take 10 mg by mouth as needed for allergies.     metoprolol  succinate (TOPROL -XL) 50 MG 24  hr tablet TAKE 1 TABLET(50 MG) BY MOUTH DAILY WITH OR IMMEDIATELY  FOLLOWING A MEAL 90 tablet 3   XARELTO  2.5 MG TABS tablet TAKE 1 TABLET(2.5 MG) BY MOUTH TWICE DAILY 180 tablet 1   nitroGLYCERIN  (NITROSTAT ) 0.4 MG SL tablet PLACE 1 TABLET UNDER THE TONGUE EVERY 5 (FIVE) MINUTES AS NEEDED FOR CHEST PAIN (UP TO 3 DOSES). (Patient not taking: Reported on 11/15/2023) 25 tablet 3   Dulaglutide  (TRULICITY ) 1.5 MG/0.5ML SOAJ ADMINISTER 1.5 MG UNDER THE SKIN 1 TIME A WEEK 2 mL 0   metFORMIN  (GLUCOPHAGE ) 1000 MG tablet Take 1 tablet (1,000 mg total) by mouth 2 (two) times daily with a meal. 180 tablet 1   tamsulosin  (FLOMAX ) 0.4 MG CAPS capsule TAKE 1 CAPSULE BY MOUTH EVERY DAY AFTER SUPPER 90 capsule 0   No facility-administered medications prior to visit.    No Known Allergies  Review of Systems  Constitutional:  Negative for appetite change, chills, fatigue and fever.  HENT:  Negative for congestion, dental problem, ear pain and sore throat.   Eyes:  Negative for discharge, redness and visual disturbance.  Respiratory:  Negative for cough, chest tightness, shortness of breath and wheezing.   Cardiovascular:  Negative for chest pain, palpitations and leg swelling.  Gastrointestinal:  Negative for abdominal pain, blood in stool, diarrhea, nausea and vomiting.  Genitourinary:  Negative for difficulty urinating, dysuria, flank pain, frequency, hematuria and urgency.  Musculoskeletal:  Negative for arthralgias, back pain, joint swelling, myalgias and neck stiffness.  Skin:  Negative for pallor and rash.  Neurological:  Negative for dizziness, speech difficulty, weakness and headaches.  Hematological:  Negative for adenopathy. Does not bruise/bleed easily.  Psychiatric/Behavioral:  Negative for confusion and sleep disturbance. The patient is not nervous/anxious.      PE;    11/15/2023    8:35 AM 10/24/2023   11:26 AM 06/13/2023    9:31 AM  Vitals with BMI  Height 5\' 7"  5\' 6"  5\' 6"   Weight 169  lbs 10 oz 170 lbs 13 oz 165 lbs 10 oz  BMI 26.56 27.58 26.74  Systolic 125 118 846  Diastolic 78 76 72  Pulse 69 72 71    Gen: Alert, well appearing.  Patient is oriented to person, place, time, and situation. AFFECT: pleasant, lucid thought and speech. ENT: Ears: EACs clear, normal epithelium.  TMs with good light reflex and landmarks bilaterally.  Eyes: no injection, icteris, swelling, or exudate.  EOMI, PERRLA. Nose: no drainage or turbinate edema/swelling.  No injection or focal lesion.  Mouth: lips without lesion/swelling.  Oral mucosa pink and moist.  Dentition intact and without obvious caries or gingival swelling.  Oropharynx without erythema, exudate, or swelling.  Neck: supple/nontender.  No LAD, mass, or TM.  Carotid pulses 2+ bilaterally, without bruits. CV: RRR, no m/r/g.   LUNGS: CTA bilat, nonlabored resps, good aeration in all lung fields. ABD: soft, NT, ND, BS normal.  No hepatospenomegaly or mass.  No bruits. EXT: no clubbing, cyanosis, or edema.  Musculoskeletal: no joint swelling, erythema, warmth, or tenderness.  ROM of all joints intact. Skin - no sores or suspicious lesions or rashes or color changes Foot exam - no swelling, tenderness or skin or vascular lesions. Color and temperature is normal. Sensation is intact. Peripheral pulses are palpable. Toenails are normal.   Pertinent labs:  Lab Results  Component Value Date   TSH 0.96 08/26/2015   Lab Results  Component Value Date   WBC 8.3 10/24/2023   HGB 15.9 10/24/2023   HCT 46.7 10/24/2023   MCV 93 10/24/2023  PLT 137 (L) 10/24/2023   Lab Results  Component Value Date   CREATININE 1.16 10/24/2023   BUN 15 10/24/2023   NA 138 10/24/2023   K 5.0 10/24/2023   CL 98 10/24/2023   CO2 23 10/24/2023   Lab Results  Component Value Date   ALT 18 11/15/2022   AST 17 11/15/2022   ALKPHOS 75 11/15/2022   BILITOT 1.0 11/15/2022   Lab Results  Component Value Date   CHOL 97 05/17/2023   Lab Results   Component Value Date   HDL 29.80 (L) 05/17/2023   Lab Results  Component Value Date   LDLCALC 45 05/17/2023   Lab Results  Component Value Date   TRIG 110.0 05/17/2023   Lab Results  Component Value Date   CHOLHDL 3 05/17/2023   Lab Results  Component Value Date   PSA 1.02 12/17/2021   PSA 3.82 01/08/2021   PSA 2.48 08/23/2019   Lab Results  Component Value Date   HGBA1C 7.3 (A) 11/15/2023   HGBA1C 7.3 11/15/2023   HGBA1C 7.3 (A) 11/15/2023   HGBA1C 7.3 (A) 11/15/2023   ASSESSMENT AND PLAN:   No problem-specific Assessment & Plan notes found for this encounter.  #1 health maintenance exam: Reviewed age and gender appropriate health maintenance issues (prudent diet, regular exercise, health risks of tobacco and excessive alcohol, use of seatbelts, fire alarms in home, use of sunscreen).  Also reviewed age and gender appropriate health screening as well as vaccine recommendations. Vaccines: ALL UTD. Labs: fasting HP, Hba1c Prostate ca screening: followed by urol for elev PSA. Colon ca screening: History of multiple polyps, recall as of march 2025---has appt set up with GI next month. Lung cancer screening: Has 30-pack-year history tobacco smoking, quit approximately 10 years ago-->he defers for now.  2 diabetes without complication, control has worsened the last 6 months. POC Hba1c 7.3% today. He prefers to get back on track with his diet and activity level rather than add medications. Continue trulicity  1.5 mg subcu weekly. Continue metformin  1000 mg twice a day. Electrolytes and renal function were normal 3 weeks ago. Feet exam normal today.  2.  Hypertension, doing well on lisinopril  40 mg a day and Toprol -XL 50 mg a day. Electrolytes and creatinine normal 3 weeks ago.   #3 hypercholesterolemia. LDL goal less than 70.  Last LDL was 45 about 6 months ago. Continue atorvastatin  80 mg daily. Check lipids and hepatic panel today.  An After Visit Summary was  printed and given to the patient.  FOLLOW UP:  Return in about 3 months (around 02/14/2024) for routine chronic illness f/u.  Signed:  Arletha Lady, MD           11/15/2023

## 2023-11-15 NOTE — Patient Instructions (Signed)
 Health Maintenance, Male  Adopting a healthy lifestyle and getting preventive care are important in promoting health and wellness. Ask your health care provider about:  The right schedule for you to have regular tests and exams.  Things you can do on your own to prevent diseases and keep yourself healthy.  What should I know about diet, weight, and exercise?  Eat a healthy diet    Eat a diet that includes plenty of vegetables, fruits, low-fat dairy products, and lean protein.  Do not eat a lot of foods that are high in solid fats, added sugars, or sodium.  Maintain a healthy weight  Body mass index (BMI) is a measurement that can be used to identify possible weight problems. It estimates body fat based on height and weight. Your health care provider can help determine your BMI and help you achieve or maintain a healthy weight.  Get regular exercise  Get regular exercise. This is one of the most important things you can do for your health. Most adults should:  Exercise for at least 150 minutes each week. The exercise should increase your heart rate and make you sweat (moderate-intensity exercise).  Do strengthening exercises at least twice a week. This is in addition to the moderate-intensity exercise.  Spend less time sitting. Even light physical activity can be beneficial.  Watch cholesterol and blood lipids  Have your blood tested for lipids and cholesterol at 69 years of age, then have this test every 5 years.  You may need to have your cholesterol levels checked more often if:  Your lipid or cholesterol levels are high.  You are older than 69 years of age.  You are at high risk for heart disease.  What should I know about cancer screening?  Many types of cancers can be detected early and may often be prevented. Depending on your health history and family history, you may need to have cancer screening at various ages. This may include screening for:  Colorectal cancer.  Prostate cancer.  Skin cancer.  Lung  cancer.  What should I know about heart disease, diabetes, and high blood pressure?  Blood pressure and heart disease  High blood pressure causes heart disease and increases the risk of stroke. This is more likely to develop in people who have high blood pressure readings or are overweight.  Talk with your health care provider about your target blood pressure readings.  Have your blood pressure checked:  Every 3-5 years if you are 9-95 years of age.  Every year if you are 85 years old or older.  If you are between the ages of 69 and 69 and are a current or former smoker, ask your health care provider if you should have a one-time screening for abdominal aortic aneurysm (AAA).  Diabetes  Have regular diabetes screenings. This checks your fasting blood sugar level. Have the screening done:  Once every three years after age 23 if you are at a normal weight and have a low risk for diabetes.  More often and at a younger age if you are overweight or have a high risk for diabetes.  What should I know about preventing infection?  Hepatitis B  If you have a higher risk for hepatitis B, you should be screened for this virus. Talk with your health care provider to find out if you are at risk for hepatitis B infection.  Hepatitis C  Blood testing is recommended for:  Everyone born from [birth year] through 1966.  Anyone  with known risk factors for hepatitis C.  Sexually transmitted infections (STIs)  You should be screened each year for STIs, including gonorrhea and chlamydia, if:  You are sexually active and are younger than 69 years of age.  You are older than 69 years of age and your health care provider tells you that you are at risk for this type of infection.  Your sexual activity has changed since you were last screened, and you are at increased risk for chlamydia or gonorrhea. Ask your health care provider if you are at risk.  Ask your health care provider about whether you are at high risk for HIV. Your health care provider  may recommend a prescription medicine to help prevent HIV infection. If you choose to take medicine to prevent HIV, you should first get tested for HIV. You should then be tested every 3 months for as long as you are taking the medicine.  Follow these instructions at home:  Alcohol use  Do not drink alcohol if your health care provider tells you not to drink.  If you drink alcohol:  Limit how much you have to 0-2 drinks a day.  Know how much alcohol is in your drink. In the [place] one drink equals one 12 oz bottle of beer (355 mL), one 5 oz glass of wine (148 mL), or one 1 oz glass of hard liquor (44 mL).  Lifestyle  Do not use any products that contain nicotine or tobacco. These products include cigarettes, chewing tobacco, and vaping devices, such as e-cigarettes. If you need help quitting, ask your health care provider.  Do not use street drugs.  Do not share needles.  Ask your health care provider for help if you need support or information about quitting drugs.  General instructions  Schedule regular health, dental, and eye exams.  Stay current with your vaccines.  Tell your health care provider if:  You often feel depressed.  You have ever been abused or do not feel safe at home.  Summary  Adopting a healthy lifestyle and getting preventive care are important in promoting health and wellness.  Follow your health care provider's instructions about healthy diet, exercising, and getting tested or screened for diseases.  Follow your health care provider's instructions on monitoring your cholesterol and blood pressure.  This information is not intended to replace advice given to you by your health care provider. Make sure you discuss any questions you have with your health care provider.  Document Revised: 11/30/2020 Document Reviewed: 11/30/2020  Elsevier Patient Education  2024 [employer].

## 2023-11-22 ENCOUNTER — Other Ambulatory Visit: Payer: Self-pay | Admitting: Family Medicine

## 2023-11-22 ENCOUNTER — Other Ambulatory Visit: Payer: Self-pay

## 2023-11-22 MED ORDER — ATORVASTATIN CALCIUM 80 MG PO TABS: 80.0000 mg | ORAL_TABLET | Freq: Every evening | ORAL | 3 refills | Status: AC

## 2023-11-26 ENCOUNTER — Other Ambulatory Visit: Payer: Self-pay | Admitting: Family Medicine

## 2023-12-12 ENCOUNTER — Telehealth: Payer: Self-pay

## 2023-12-12 ENCOUNTER — Ambulatory Visit: Admitting: Physician Assistant

## 2023-12-12 ENCOUNTER — Encounter: Payer: Self-pay | Admitting: Physician Assistant

## 2023-12-12 VITALS — BP 120/76 | HR 68 | Ht 67.0 in | Wt 170.0 lb

## 2023-12-12 DIAGNOSIS — E1169 Type 2 diabetes mellitus with other specified complication: Secondary | ICD-10-CM

## 2023-12-12 DIAGNOSIS — I2510 Atherosclerotic heart disease of native coronary artery without angina pectoris: Secondary | ICD-10-CM | POA: Diagnosis not present

## 2023-12-12 DIAGNOSIS — E119 Type 2 diabetes mellitus without complications: Secondary | ICD-10-CM

## 2023-12-12 DIAGNOSIS — Z860101 Personal history of adenomatous and serrated colon polyps: Secondary | ICD-10-CM

## 2023-12-12 DIAGNOSIS — I255 Ischemic cardiomyopathy: Secondary | ICD-10-CM

## 2023-12-12 DIAGNOSIS — Z7901 Long term (current) use of anticoagulants: Secondary | ICD-10-CM

## 2023-12-12 DIAGNOSIS — Z7985 Long-term (current) use of injectable non-insulin antidiabetic drugs: Secondary | ICD-10-CM

## 2023-12-12 DIAGNOSIS — R194 Change in bowel habit: Secondary | ICD-10-CM | POA: Diagnosis not present

## 2023-12-12 DIAGNOSIS — I739 Peripheral vascular disease, unspecified: Secondary | ICD-10-CM

## 2023-12-12 DIAGNOSIS — Z87891 Personal history of nicotine dependence: Secondary | ICD-10-CM

## 2023-12-12 MED ORDER — NA SULFATE-K SULFATE-MG SULF 17.5-3.13-1.6 GM/177ML PO SOLN
1.0000 | Freq: Once | ORAL | 0 refills | Status: AC
Start: 2023-12-12 — End: 2023-12-12

## 2023-12-12 NOTE — Telephone Encounter (Signed)
 [facility] Pre-operative Risk Assessment     Request for surgical clearance:     Endoscopy Procedure  What type of surgery is being performed?     Colonoscopy  When is this surgery scheduled?     02/06/24  What type of clearance is required ?   Pharmacy  Are there any medications that need to be held prior to surgery and how long? Xarelto  2 days  Practice name and name of physician performing surgery?      [facility]  What is your office phone and fax number?      Phone- [phone]  Fax- [phone]  Anesthesia type (None, local, MAC, general) ?       MAC   Please route your response to [employer], CMA

## 2023-12-12 NOTE — Patient Instructions (Addendum)
 You have been scheduled for a colonoscopy. Please follow written instructions given to you at your visit today.   If you use inhalers (even only as needed), please bring them with you on the day of your procedure.  DO NOT TAKE 7 DAYS PRIOR TO TEST- Trulicity  (dulaglutide ) Ozempic , Wegovy (semaglutide ) Mounjaro (tirzepatide) Bydureon Bcise (exanatide extended release)  DO NOT TAKE 1 DAY PRIOR TO YOUR TEST Rybelsus (semaglutide ) Adlyxin (lixisenatide) Victoza (liraglutide) Byetta (exanatide) ___________________________________________________________________________   FIBER SUPPLEMENT You can do metamucil or fibercon once or twice a day but if this causes gas/bloating please switch to Benefiber or Citracel.  Fiber is good for constipation/diarrhea/irritable bowel syndrome.  It can also help with weight loss and can help lower your bad cholesterol (LDL).  Please do 1 TBSP in the morning in water , coffee, or tea.  It can take up to a month before you can see a difference with your bowel movements.  It is cheapest from [employer], [employer], [employer].     Thank you for trusting me with your gastrointestinal care!   Santina Cull, PA-C  _______________________________________________________  If your blood pressure at your visit was 140/90 or greater, please contact your primary care physician to follow up on this.  _______________________________________________________  If you are age 69 or older, your body mass index should be between 23-30. Your Body mass index is 26.63 kg/m. If this is out of the aforementioned range listed, please consider follow up with your Primary Care Provider.  If you are age 59 or younger, your body mass index should be between 19-25. Your Body mass index is 26.63 kg/m. If this is out of the aformentioned range listed, please consider follow up with your Primary Care Provider.   ________________________________________________________  The [facility]  providers would like to encourage you to use MYCHART to communicate with providers for non-urgent requests or questions.  Due to long hold times on the telephone, sending your provider a message by [facility] may be a faster and more efficient way to get a response.  Please allow 48 business hours for a response.  Please remember that this is for non-urgent requests.  _______________________________________________________

## 2023-12-12 NOTE — Progress Notes (Signed)
 12/12/2023 ALISTAIR SENFT [id number] [birth year]  Referring provider: Shelvia Dick, MD Primary GI doctor: Dr. Karene Oto ( Dr. Howard Macho)  ASSESSMENT AND PLAN:  TA polyps, no family history 2022 6 TA polyps 2-6 mm, recall March 2025 Will schedule colon at [facility], We have discussed the risks of bleeding, infection, perforation, medication reactions, and remote risk of death associated with colonoscopy. All questions were answered and the patient acknowledges these risk and wishes to proceed.  Change in bowel habits  Worsening diarrhea with trulicity , on metformin  1000 mg SR No hematochezia, no nocturnal symptoms, no AB pain some weight loss contributed to GLP1 Diarrhea 2-3 days after shot, can have constipation as well, can have some formed stools - add on fiber - get colonoscopy - consider switching MF to long acting - consider trial off trulicity   PAD diagnosed 2021 On Xarelto  prescribed by Dr. Antionette Kirks Hold Xarelto  for 2 days before procedure  will instruct when and how to resume after procedure.  Patient understands that there is a low but real risk of cardiovascular event such as heart attack, stroke, or embolism /  thrombosis, or ischemia while off Xarelto .  The patient consents to proceed.  Will communicate by phone or EMR with patient's prescribing provider to confirm that holding Xarelto  is reasonable in this case.   CAD x 2017 S/p DES stent LCX Follows with Dr. Audery Blazing No chest pain, no SOB  Ischemic cardiomyopathy 10/17/2022 Echo improved EF 50-55%, LVH mild, normal valves Improved with medical therapy  DM2 On trulicity  x 1 year Lab Results  Component Value Date   HGBA1C 7.3 (A) 11/15/2023   HGBA1C 7.3 11/15/2023   HGBA1C 7.3 (A) 11/15/2023   HGBA1C 7.3 (A) 11/15/2023  Discussed GLP1 with the patient, mechanism of action. Gastroparesis diet given to the patient.  Patient should be instructed to hold this medications if dose falls within 7  days of endoscopic procedure, due to increased risk of retained gastric contents.   Patient Care Team: Shelvia Dick, MD as PCP - General (Family Medicine) Audery Blazing Deannie Fabian, MD as PCP - Cardiology (Cardiology) Wenona Hamilton, MD as PCP - [facility] (Cardiology) Florencio Hunting, MD as Consulting Physician (Urology) Audery Blazing Deannie Fabian, MD as Consulting Physician (Cardiology) Janel Medford, MD (Inactive) as Consulting Physician (Gastroenterology)  HISTORY OF PRESENT ILLNESS: 68 y.o. male with a past medical history listed below presents for evaluation of screening colonoscopy while on xarelto .   Discussed the use of AI scribe software for clinical note transcription with the patient, who gave verbal consent to proceed.  History of Present Illness   JOHATHAN PROVINCE is a 68 year old male with a history of precancerous colon polyps who presents for a follow-up colonoscopy.  He had a colonoscopy in March 2022 which revealed nine sessile polyps in the sigmoid descending colon, ranging from two to six millimeters in size, and small internal hemorrhoids. All polyps were removed and were precancerous. He is due for a follow-up colonoscopy this year due to the number of polyps previously found, which increases the risk for colorectal cancer.  He has experienced changes in bowel habits since starting Trulicity  for diabetes about a year ago. Diarrhea typically occurs on the second or third day after the weekly injection, with symptoms being more manageable in the last three to four months. He has not taken any medication for the loose stools as they do not bother him significantly. Diarrhea usually occurs one day a week,  but sometimes persists throughout the week. Occasional constipation is noted towards the end of the week. No blood in the stool, nighttime symptoms, abdominal pain, heartburn, nausea, vomiting, or trouble swallowing. He has experienced weight loss, with his weight decreasing  from 190 pounds to 165-170 pounds over the past eight to ten months.  He is on Metformin , which is not the extended-release form, and continues to take it alongside Trulicity . He is also on Xarelto , prescribed for a blood clot behind his knee diagnosed in 2020 or 2021. He has a history of a heart attack in 2017 and a mild one in 2005, for which a stent was placed. No current chest pain or shortness of breath. He quit smoking, although his wife still smokes. He reports no swelling in his legs recently.      He  reports that he quit smoking about 8 years ago. His smoking use included cigarettes. He started smoking about 48 years ago. He has a 10 pack-year smoking history. He has never used smokeless tobacco. He reports that he does not drink alcohol and does not use drugs.  RELEVANT GI HISTORY, IMAGING AND LABS: Results   DIAGNOSTIC Colonoscopy: Nine sessile polyps in the sigmoid descending colon, 2-6 mm in size, small internal hemorrhoids (09/2020)      CBC    Component Value Date/Time   WBC 8.3 10/24/2023 1151   WBC 8.6 11/15/2022 1031   RBC 5.02 10/24/2023 1151   RBC 5.12 11/15/2022 1031   HGB 15.9 10/24/2023 1151   HCT 46.7 10/24/2023 1151   PLT 137 (L) 10/24/2023 1151   MCV 93 10/24/2023 1151   MCH 31.7 10/24/2023 1151   MCH 30.6 11/08/2016 1019   MCHC 34.0 10/24/2023 1151   MCHC 35.4 11/15/2022 1031   RDW 13.1 10/24/2023 1151   LYMPHSABS 1.2 10/02/2020 1003   MONOABS 0.6 10/02/2020 1003   EOSABS 0.1 10/02/2020 1003   BASOSABS 0.0 10/02/2020 1003   Recent Labs    10/24/23 1151  HGB 15.9    CMP     Component Value Date/Time   NA 138 10/24/2023 1151   K 5.0 10/24/2023 1151   CL 98 10/24/2023 1151   CO2 23 10/24/2023 1151   GLUCOSE 141 (H) 10/24/2023 1151   GLUCOSE 140 (H) 05/17/2023 0856   BUN 15 10/24/2023 1151   CREATININE 1.16 10/24/2023 1151   CALCIUM  9.5 10/24/2023 1151   PROT 6.8 11/15/2023 0914   ALBUMIN 4.4 11/15/2023 0914   AST 17 11/15/2023 0914   ALT  14 11/15/2023 0914   ALKPHOS 74 11/15/2023 0914   BILITOT 1.2 11/15/2023 0914   GFRNONAA >60 11/08/2016 1019   GFRAA >60 11/08/2016 1019      Latest Ref Rng & Units 11/15/2023    9:14 AM 11/15/2022   10:31 AM 05/02/2022    8:54 AM  Hepatic Function  Total Protein 6.0 - 8.3 g/dL 6.8  6.6  6.7   Albumin 3.5 - 5.2 g/dL 4.4  4.4  4.3   AST 0 - 37 U/L 17  17  18    ALT 0 - 53 U/L 14  18  16    Alk Phosphatase 39 - 117 U/L 74  75  63   Total Bilirubin 0.2 - 1.2 mg/dL 1.2  1.0  1.0   Bilirubin, Direct 0.0 - 0.3 mg/dL 0.3         Current Medications:   Current Outpatient Medications (Endocrine & Metabolic):    Dulaglutide  (TRULICITY ) 1.5 MG/0.5ML SOAJ,  Inject 1.5 mg into the skin once a week.   metFORMIN  (GLUCOPHAGE ) 1000 MG tablet, Take 1 tablet (1,000 mg total) by mouth 2 (two) times daily with a meal.  Current Outpatient Medications (Cardiovascular):    atorvastatin  (LIPITOR ) 80 MG tablet, Take 1 tablet (80 mg total) by mouth every evening.   lisinopril  (ZESTRIL ) 40 MG tablet, Take 1 tablet (40 mg total) by mouth daily.   metoprolol  succinate (TOPROL -XL) 50 MG 24 hr tablet, TAKE 1 TABLET(50 MG) BY MOUTH DAILY WITH OR IMMEDIATELY FOLLOWING A MEAL   nitroGLYCERIN  (NITROSTAT ) 0.4 MG SL tablet, PLACE 1 TABLET UNDER THE TONGUE EVERY 5 (FIVE) MINUTES AS NEEDED FOR CHEST PAIN (UP TO 3 DOSES).  Current Outpatient Medications (Respiratory):    loratadine (CLARITIN) 10 MG tablet, Take 10 mg by mouth as needed for allergies.  Current Outpatient Medications (Analgesics):    acetaminophen  (TYLENOL ) 500 MG tablet, Take 1,000 mg by mouth daily as needed for mild pain or headache.   aspirin  EC 81 MG tablet, Take 81 mg by mouth daily.  Current Outpatient Medications (Hematological):    XARELTO  2.5 MG TABS tablet, TAKE 1 TABLET(2.5 MG) BY MOUTH TWICE DAILY  Current Outpatient Medications (Other):    calcium  carbonate (TUMS - DOSED IN MG ELEMENTAL CALCIUM ) 500 MG chewable tablet, Chew 1 tablet by  mouth daily as needed for indigestion or heartburn.   tamsulosin  (FLOMAX ) 0.4 MG CAPS capsule, TAKE 1 CAPSULE BY MOUTH EVERY DAY AFTER SUPPER  Medical History:  Past Medical History:  Diagnosis Date   Arthritis    Ascending aorta dilatation (HCC)    a. Ascending aorta mildly dilated by echo 09/03/15.   Bladder cancer [facility]) 2012   Mass removed, then BCG by urologist (Dr. Rozanne Corners).  Recurrence documented on cysto + bladder washings 10/14/15--getting more maintenance BCG.  Cysto 01/20/16 OK, then more BCG given.  Cysto 04/13/16 c/w just having finished BCG but cytology/FISH ok.  Cysto 07/27/2016 c/w small area of BCG related inflamm, BCG stopped due to severe sx's. 12/2020 cysto benign   Bladder cancer (HCC)    Surveillance cysto 11/02/16 cysto showed 1.5 cm raised erythematous lesion->bx benign. Subsequent cystoscopies unchanged, most recent 07/2019 and 04/2021.   BPH (benign prostatic hyperplasia)    with hx of acute prostatitis   CAD (coronary artery disease)    a. '05 MI > DES to circumflex. b. NSTEMI 08/2015 with total occlusion of the mid LAD along with 20% mid-distal Cx, 100% dCX, 40% D1, 50% pCx, 50% m-dRCA, 80% dRCA.Given >12 hours out from symptom onset, medical therapy recommended.   Diabetes mellitus type 2, controlled (HCC)    HbA1c 02/2011 was 6.6%   DVT (deep venous thrombosis) (HCC)    Elevated PSA 09/2011   with prostate nodule.  Prostate bx ALL BENIGN 10/10/11 (Dr. Haven Lion followed by Urol--improved to 1.18 Jun 2017.  Nodule NOT palpable anymore on DRE at [facility] 01/2018 office f/u. Stable as of 08/2019. Slight incease 12/2020, urol to rpt 1 yr.   GERD (gastroesophageal reflux disease)    occasional   Headache(784.0)    cluster headaches a long time ago   History of adenomatous polyp of colon 09/23/2020   recall 09/2025   History of bronchitis    History of vertigo    HTN (hypertension)    Hyperlipidemia    Ischemic cardiomyopathy    a. 2D Echo 09/03/15: mild LVH, EF 40-45%, diffuse HK,  akinesis of apical myocardium, grade 1 DD, aortic root dimention 38mm, ascending aorta  mildly dilated, mildly dilated RV/RA.    Myocardial infarction [facility]) 2005; 08/2015   followed by Dr. Audery Blazing   PAD (peripheral artery disease) (HCC) 06/2019   R LL abnl ABI and doppler->see PSH section of EMR for details.   Thrombocytopenia (HCC)    Stable 2012-2017   Tobacco abuse    Wolf-Parkinson-White syndrome    Allergies: No Known Allergies   Surgical History:  He  has a past surgical history that includes Bladder tumor excision (11/08/10; 05/2015); myocardial perfusion study (2009); lexiscan  (2012); Lumbar spine surgery (1989 & 1991); Colonoscopy (2010; 09/23/20); Vasectomy; Cardiac catheterization (2005); Transurethral resection of bladder tumor (N/A, 06/06/2013); Cystoscopy with retrograde pyelogram, ureteroscopy and stent placement (N/A, 06/06/2013); Cystoscopy w/ retrogrades (Bilateral, 06/22/2015); Transurethral resection of bladder tumor (N/A, 06/22/2015); Cardiac catheterization (N/A, 09/01/2015); Transurethral resection of bladder tumor (N/A, 11/17/2016); Cystoscopy w/ retrogrades (N/A, 11/17/2016); LE ABI vascular studies (06/2019); and transthoracic echocardiogram. Family History:  His family history includes Cancer in his father; Diabetes in his brother; Heart disease in his brother and paternal uncle; Hypertension in his father and mother; Multiple sclerosis in his brother.  REVIEW OF SYSTEMS  : All other systems reviewed and negative except where noted in the History of Present Illness.  PHYSICAL EXAM: BP 120/76   Pulse 68   Ht 5\' 7"  (1.702 m)   Wt 170 lb (77.1 kg)   SpO2 96%   BMI 26.63 kg/m  Physical Exam   GENERAL APPEARANCE: Well nourished, in no apparent distress. HEENT: No cervical lymphadenopathy, unremarkable thyroid , sclerae anicteric, conjunctiva pink. RESPIRATORY: Respiratory effort normal, lungs clear to auscultation bilaterally, breath sounds equal bilaterally without  rales, rhonchi, or wheezing. CARDIO: Regular rate and rhythm with no murmurs, rubs, or gallops, peripheral pulses intact. ABDOMEN: Soft, non-distended, active bowel sounds in all four quadrants, no tenderness to palpation, no rebound, no mass appreciated. RECTAL: Declines. MUSCULOSKELETAL: Full range of motion, normal gait, without edema. SKIN: Dry, intact without rashes or lesions. No jaundice. NEURO: Alert, oriented, no focal deficits. PSYCH: Cooperative, normal mood and affect.      Edmonia Gottron, PA-C 2:21 PM

## 2023-12-13 NOTE — Telephone Encounter (Signed)
 Pharmacy please advise on holding Xarelto  prior to colonoscopy scheduled for 02/06/2024. Thank you.    Judeen Nose

## 2023-12-14 NOTE — Telephone Encounter (Signed)
 I spoke to Mark Blankenship and I advised him that his cardiologist ok'd the 2 day hold on his Xarelto .  Patient confirmed that he will hold on 7/13 and 7/14.

## 2023-12-14 NOTE — Telephone Encounter (Signed)
   Patient Name: RUVIM RISKO  DOB: 1956-03-21 MRN: [id number]  Primary Cardiologist: Alexandria Angel, MD  Clinical pharmacists have reviewed the patient's past medical history, labs, and current medications as part of preoperative protocol coverage. The following recommendations have been made:   Per office protocol, patient can hold Xarelto  for 2 days prior to procedure.     I will route this recommendation to the requesting party via Epic fax function and remove from pre-op pool.  Please call with questions.  Francene Ing, Retha Cast, NP 12/14/2023, 12:59 PM

## 2023-12-14 NOTE — Progress Notes (Signed)
 Agree with the assessment and plan as outlined by Quentin Mulling, PA-C. ? ?Keron Neenan, DO, FACG ? ?

## 2023-12-14 NOTE — Telephone Encounter (Signed)
 Lmtcb.  (Re: his physician ok'd him to hold his Xarelto  for 2 days.)

## 2023-12-14 NOTE — Telephone Encounter (Signed)
 Patient with diagnosis of PAD and hx of DVT on Xarelto  for anticoagulation.    Procedure: Colonoscopy  Date of procedure: 02/06/2024   CrCl 61 mL/min  Platelet count 137 K    Per office protocol, patient can hold Xarelto  for 2 days prior to procedure.     **This guidance is not considered finalized until pre-operative APP has relayed final recommendations.**

## 2023-12-19 ENCOUNTER — Other Ambulatory Visit: Payer: Self-pay

## 2023-12-19 MED ORDER — LISINOPRIL 40 MG PO TABS: 40.0000 mg | ORAL_TABLET | Freq: Every day | ORAL | 3 refills | Status: DC

## 2023-12-26 DIAGNOSIS — Z8551 Personal history of malignant neoplasm of bladder: Secondary | ICD-10-CM | POA: Diagnosis not present

## 2024-01-10 DIAGNOSIS — N401 Enlarged prostate with lower urinary tract symptoms: Secondary | ICD-10-CM | POA: Diagnosis not present

## 2024-01-10 DIAGNOSIS — R972 Elevated prostate specific antigen [PSA]: Secondary | ICD-10-CM | POA: Diagnosis not present

## 2024-01-10 DIAGNOSIS — R8289 Other abnormal findings on cytological and histological examination of urine: Secondary | ICD-10-CM | POA: Diagnosis not present

## 2024-01-10 DIAGNOSIS — R3914 Feeling of incomplete bladder emptying: Secondary | ICD-10-CM | POA: Diagnosis not present

## 2024-01-10 DIAGNOSIS — Z8551 Personal history of malignant neoplasm of bladder: Secondary | ICD-10-CM | POA: Diagnosis not present

## 2024-01-29 ENCOUNTER — Encounter: Payer: Self-pay | Admitting: Gastroenterology

## 2024-02-06 ENCOUNTER — Ambulatory Visit: Admitting: Gastroenterology

## 2024-02-06 ENCOUNTER — Encounter: Payer: Self-pay | Admitting: Gastroenterology

## 2024-02-06 VITALS — BP 117/73 | HR 70 | Temp 97.3°F | Resp 18 | Ht 67.0 in | Wt 170.0 lb

## 2024-02-06 DIAGNOSIS — K648 Other hemorrhoids: Secondary | ICD-10-CM | POA: Diagnosis not present

## 2024-02-06 DIAGNOSIS — D123 Benign neoplasm of transverse colon: Secondary | ICD-10-CM

## 2024-02-06 DIAGNOSIS — K641 Second degree hemorrhoids: Secondary | ICD-10-CM

## 2024-02-06 DIAGNOSIS — Z1211 Encounter for screening for malignant neoplasm of colon: Secondary | ICD-10-CM

## 2024-02-06 DIAGNOSIS — D122 Benign neoplasm of ascending colon: Secondary | ICD-10-CM

## 2024-02-06 DIAGNOSIS — D120 Benign neoplasm of cecum: Secondary | ICD-10-CM

## 2024-02-06 DIAGNOSIS — Z860101 Personal history of adenomatous and serrated colon polyps: Secondary | ICD-10-CM

## 2024-02-06 DIAGNOSIS — K635 Polyp of colon: Secondary | ICD-10-CM | POA: Diagnosis not present

## 2024-02-06 DIAGNOSIS — R194 Change in bowel habit: Secondary | ICD-10-CM

## 2024-02-06 DIAGNOSIS — D125 Benign neoplasm of sigmoid colon: Secondary | ICD-10-CM

## 2024-02-06 DIAGNOSIS — D124 Benign neoplasm of descending colon: Secondary | ICD-10-CM

## 2024-02-06 MED ORDER — SODIUM CHLORIDE 0.9 % IV SOLN: 500.0000 mL | Freq: Once | INTRAVENOUS | Status: DC

## 2024-02-06 NOTE — Progress Notes (Signed)
 To pacu, VSS. Report to [email or web address]

## 2024-02-06 NOTE — Op Note (Signed)
 [facility] Patient Name: Mark Blankenship Procedure Date: 02/06/2024 10:23 AM MRN: [id number] Endoscopist: Sandor Flatter , MD, [id number] Age: 68 Referring MD:  Date of Birth: 07-14-1956 Gender: Male Account #: [id number] Procedure:                Colonoscopy Indications:              Surveillance: Personal history of adenomatous                            polyps on last colonoscopy 3 years ago                           Last colonoscopy was 09/2020 and notable for 9                            subcentimeter polyps (path: Tubular adenomas and                            sessile serrated polyps) scattered throughout the                            colon with recommendation to repeat in 3 years.                           Separately, had been having changes in bowel habits                            earlier this year. Medicines:                Monitored Anesthesia Care Procedure:                Pre-Anesthesia Assessment:                           - Prior to the procedure, a History and Physical                            was performed, and patient medications and                            allergies were reviewed. The patient's tolerance of                            previous anesthesia was also reviewed. The risks                            and benefits of the procedure and the sedation                            options and risks were discussed with the patient.                            All questions were answered, and informed consent  was obtained. Prior Anticoagulants: The patient has                            taken Xarelto  (rivaroxaban ), last dose was 2 days                            prior to procedure. ASA Grade Assessment: III - A                            patient with severe systemic disease. After                            reviewing the risks and benefits, the patient was                            deemed in satisfactory condition to  undergo the                            procedure.                           After obtaining informed consent, the colonoscope                            was passed under direct vision. Throughout the                            procedure, the patient's blood pressure, pulse, and                            oxygen saturations were monitored continuously. The                            CF [id number] #[id number] was introduced through the anus                            and advanced to the the cecum, identified by                            appendiceal orifice and ileocecal valve. The                            colonoscopy was performed without difficulty. The                            patient tolerated the procedure well. The quality                            of the bowel preparation was good. The ileocecal                            valve, appendiceal orifice, and rectum were  photographed. Scope In: 10:32:00 AM Scope Out: 10:53:32 AM Scope Withdrawal Time: 0 hours 19 minutes 9 seconds  Total Procedure Duration: 0 hours 21 minutes 32 seconds  Findings:                 The perianal and digital rectal examinations were                            normal.                           Two sessile polyps were found in the ascending                            colon and cecum. The polyps were 3 to 5 mm in size.                            These polyps were removed with a cold snare.                            Resection and retrieval were complete. Estimated                            blood loss was minimal.                           Five sessile polyps were found in the sigmoid                            colon, descending colon and transverse colon. The                            polyps were 3 to 6 mm in size. These polyps were                            removed with a cold snare. Resection and retrieval                            were complete. Estimated blood loss was minimal.                            The mucosa was otherwise normal-appearing                            throughout the colon. No areas of mucosal erythema,                            edema, erosions, ulceration. Biopsies for histology                            were taken with a cold forceps from the right colon                            and left colon for evaluation of microscopic  colitis. Estimated blood loss was minimal.                           Non-bleeding internal hemorrhoids were found during                            retroflexion. The hemorrhoids were small. Complications:            No immediate complications. Estimated Blood Loss:     Estimated blood loss was minimal. Impression:               - Two 3 to 5 mm polyps in the ascending colon and                            in the cecum, removed with a cold snare. Resected                            and retrieved.                           - Five 3 to 6 mm polyps in the sigmoid colon, in                            the descending colon and in the transverse colon,                            removed with a cold snare. Resected and retrieved.                           - Normal mucosa in the entire examined colon.                            Biopsied.                           - Non-bleeding internal hemorrhoids. Recommendation:           - Patient has a contact number available for                            emergencies. The signs and symptoms of potential                            delayed complications were discussed with the                            patient. Return to normal activities tomorrow.                            Written discharge instructions were provided to the                            patient.                           - Resume previous diet.                           -  Continue present medications.                           - Await pathology results.                           - Repeat colonoscopy  for surveillance based on                            pathology results.                           - Resume Xarelto  (rivaroxaban ) at prior dose                            tomorrow.                           - Return to GI clinic PRN. Sandor Flatter, MD 02/06/2024 11:02:23 AM

## 2024-02-06 NOTE — Progress Notes (Signed)
 Called to room to assist during endoscopic procedure.  Patient ID and intended procedure confirmed with present staff. Received instructions for my participation in the procedure from the performing physician.

## 2024-02-06 NOTE — Patient Instructions (Addendum)
 Resume Xarelto  (rivaroxaban ) at prior dose tomorrow -Handout on polyps, hemorrhoids provided. -await pathology results. -repeat colonoscopy for surveillance recommended. Date to be determined when pathology result become available.  -Continue present medications.  YOU HAD AN ENDOSCOPIC PROCEDURE TODAY AT THE [facility]:   Refer to the procedure report that was given to you for any specific questions about what was found during the examination.  If the procedure report does not answer your questions, please call your gastroenterologist to clarify.  If you requested that your care partner not be given the details of your procedure findings, then the procedure report has been included in a sealed envelope for you to review at your convenience later.  YOU SHOULD EXPECT: Some feelings of bloating in the abdomen. Passage of more gas than usual.  Walking can help get rid of the air that was put into your GI tract during the procedure and reduce the bloating. If you had a lower endoscopy (such as a colonoscopy or flexible sigmoidoscopy) you may notice spotting of blood in your stool or on the toilet paper. If you underwent a bowel prep for your procedure, you may not have a normal bowel movement for a few days.  Please Note:  You might notice some irritation and congestion in your nose or some drainage.  This is from the oxygen used during your procedure.  There is no need for concern and it should clear up in a day or so.  SYMPTOMS TO REPORT IMMEDIATELY:  Following lower endoscopy (colonoscopy or flexible sigmoidoscopy):  Excessive amounts of blood in the stool  Significant tenderness or worsening of abdominal pains  Swelling of the abdomen that is new, acute  Fever of 100F or higher   For urgent or emergent issues, a gastroenterologist can be reached at any hour by calling [phone]. Do not use MyChart messaging for urgent concerns.    DIET:  We do recommend a small meal at  first, but then you may proceed to your regular diet.  Drink plenty of fluids but you should avoid alcoholic beverages for 24 hours.  ACTIVITY:  You should plan to take it easy for the rest of today and you should NOT DRIVE or use heavy machinery until tomorrow (because of the sedation medicines used during the test).    FOLLOW UP: Our staff will call the number listed on your records the next business day following your procedure.  We will call around 7:15- 8:00 am to check on you and address any questions or concerns that you may have regarding the information given to you following your procedure. If we do not reach you, we will leave a message.     If any biopsies were taken you will be contacted by phone or by letter within the next 1-3 weeks.  Please call us  at [phone] if you have not heard about the biopsies in 3 weeks.    SIGNATURES/CONFIDENTIALITY: You and/or your care partner have signed paperwork which will be entered into your electronic medical record.  These signatures attest to the fact that that the information above on your After Visit Summary has been reviewed and is understood.  Full responsibility of the confidentiality of this discharge information lies with you and/or your care-partner.

## 2024-02-06 NOTE — Progress Notes (Signed)
 GASTROENTEROLOGY PROCEDURE H&P NOTE   Primary Care Physician: Candise Aleene DEL, MD    Reason for Procedure:  Colon polyp surveillance  Plan:    Colonoscopy  Patient is appropriate for endoscopic procedure(s) in the ambulatory ([facility]) setting.  The nature of the procedure, as well as the risks, benefits, and alternatives were carefully and thoroughly reviewed with the patient. Ample time for discussion and questions allowed. The patient understood, was satisfied, and agreed to proceed.     HPI: Mark Blankenship is a 68 y.o. male who presents for colonoscopy for ongoing colon polyp surveillance and colon cancer screening.   Last colonoscopy was 09/2020 and notable for 9 subcentimeter polyps (path: Tubular adenomas and sessile serrated polyps) scattered throughout the colon with recommendation to repeat in 3 years.  Separately, has been having changes in bowel habits with worsening diarrhea.  Worse since starting Trulicity  last year.  Has been holding Xarelto  x 2 days for procedures today.  Otherwise, no significant changes in clinical history since last OV on 12/12/2023.  No new labs or imaging for review.  Chronic mild thrombocytopenia with PLT 137 in April.  Past Medical History:  Diagnosis Date   Allergy 2000p   Arthritis    Ascending aorta dilatation (HCC)    a. Ascending aorta mildly dilated by echo 09/03/15.   Bladder cancer [facility]) 2012   Mass removed, then BCG by urologist (Dr. Renda).  Recurrence documented on cysto + bladder washings 10/14/15--getting more maintenance BCG.  Cysto 01/20/16 OK, then more BCG given.  Cysto 04/13/16 c/w just having finished BCG but cytology/FISH ok.  Cysto 07/27/2016 c/w small area of BCG related inflamm, BCG stopped due to severe sx's. 12/2020 cysto benign   Bladder cancer (HCC)    Surveillance cysto 11/02/16 cysto showed 1.5 cm raised erythematous lesion->bx benign. Subsequent cystoscopies unchanged, most recent 07/2019 and 04/2021.   BPH (benign  prostatic hyperplasia)    with hx of acute prostatitis   CAD (coronary artery disease)    a. '05 MI > DES to circumflex. b. NSTEMI 08/2015 with total occlusion of the mid LAD along with 20% mid-distal Cx, 100% dCX, 40% D1, 50% pCx, 50% m-dRCA, 80% dRCA.Given >12 hours out from symptom onset, medical therapy recommended.   Diabetes mellitus type 2, controlled (HCC)    HbA1c 02/2011 was 6.6%   DVT (deep venous thrombosis) (HCC)    Elevated PSA 09/2011   with prostate nodule.  Prostate bx ALL BENIGN 10/10/11 (Dr. Lena followed by Urol--improved to 1.18 Jun 2017.  Nodule NOT palpable anymore on DRE at [facility] 01/2018 office f/u. Stable as of 08/2019. Slight incease 12/2020, urol to rpt 1 yr.   GERD (gastroesophageal reflux disease)    occasional   Headache(784.0)    cluster headaches a long time ago   History of adenomatous polyp of colon 09/23/2020   recall 09/2025   History of bronchitis    History of vertigo    HTN (hypertension)    Hyperlipidemia    Ischemic cardiomyopathy    a. 2D Echo 09/03/15: mild LVH, EF 40-45%, diffuse HK, akinesis of apical myocardium, grade 1 DD, aortic root dimention 38mm, ascending aorta mildly dilated, mildly dilated RV/RA.    Myocardial infarction [facility]) 2005; 08/2015   followed by Dr. Pietro   PAD (peripheral artery disease) (HCC) 06/2019   R LL abnl ABI and doppler->see PSH section of EMR for details.   Thrombocytopenia (HCC)    Stable 2012-2017   Tobacco abuse  Wolf-Parkinson-White syndrome     Past Surgical History:  Procedure Laterality Date   BLADDER TUMOR EXCISION  11/08/10; 05/2015   urothelial carcinoma (Dr. Renda).  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   CARDIAC CATHETERIZATION  2005   with stent placement   CARDIAC CATHETERIZATION N/A 09/01/2015   Procedure: Left Heart Cath and Coronary Angiography;  Surgeon: Victory LELON Sharps, MD;  Location: [facility] INVASIVE CV LAB;  Service: Cardiovascular;  Laterality:  N/A;   COLONOSCOPY  2010; 09/23/20   2010 Dr. Lorinda. 09/2020 Dr. Dal 09/2025.   CYSTOSCOPY W/ RETROGRADES Bilateral 06/22/2015   Procedure: CYSTOSCOPY WITH RETROGRADE PYELOGRAM;  Surgeon: Gretel Renda, MD;  Location: [facility];  Service: Urology;  Laterality: Bilateral;   CYSTOSCOPY W/ RETROGRADES N/A 11/17/2016   Procedure: CYSTOSCOPY WITH RETROGRADE PYELOGRAM;  Surgeon: Gretel Renda, MD;  Location: [facility];  Service: Urology;  Laterality: N/A;   CYSTOSCOPY WITH RETROGRADE PYELOGRAM, URETEROSCOPY AND STENT PLACEMENT N/A 06/06/2013   Procedure: CYSTOSCOPY WITH RETROGRADE PYELOGRAM;  Surgeon: Noretta Renda, MD;  Location: [facility];  Service: Urology;  Laterality: N/A;   LE ABI vascular studies  06/2019   R LL claudication + evidence of PAD (ABI very low AND popliteal and peroneal artery occlusion on dopler u/s) in R leg (L leg fine)->referred to Dr. Darron.  Conservative mgmt with walking program and low dose xarelto  first, then consider revascularization.   lexiscan   2012   Normal/negative   LUMBAR SPINE SURGERY  1989 & 1991   Neurosurgeon in [place] ([facility])-no trouble since last surgery   myocardial perfusion study  2009   Neg ischemia, EF 52%   TRANSTHORACIC ECHOCARDIOGRAM     09/2022 EF 50-55%, apical hypokinesis, indeterm diast fxn, AV sclerosis w/out stenosis.  Ascending aorta dilation to 38 mm   TRANSURETHRAL RESECTION OF BLADDER TUMOR N/A 06/06/2013   Procedure: TRANSURETHRAL RESECTION OF BLADDER TUMOR (TURBT);  Surgeon: Noretta Renda, MD;  Location: [facility];  Service: Urology;  Laterality: N/A;.  PATH: squamous metaplasia, fibrosis with chronic inflammation, no malignancy.   TRANSURETHRAL RESECTION OF BLADDER TUMOR N/A 06/22/2015   Carcinoma in situ: no invasive malignancy.  Procedure: TRANSURETHRAL RESECTION OF BLADDER TUMOR (TURBT);  Surgeon: Gretel Renda, MD;  Location: [facility];  Service: Urology;  Laterality: N/A;  **BLADDER BIOPSY*RESECTION**   TRANSURETHRAL RESECTION OF  BLADDER TUMOR N/A 11/17/2016   Path: BENIGN UROTHELIUM WITH INFLAMMATION--no malignancy. Procedure: TRANSURETHRAL RESECTION OF BLADDER TUMOR (TURBT);  Surgeon: Gretel Renda, MD;  Location: [facility];  Service: Urology;  Laterality: N/A;   VASECTOMY      Prior to Admission medications   Medication Sig Start Date End Date Taking? Authorizing Provider  acetaminophen  (TYLENOL ) 500 MG tablet Take 1,000 mg by mouth daily as needed for mild pain or headache.   Yes [provider]  calcium  carbonate (TUMS - DOSED IN MG ELEMENTAL CALCIUM ) 500 MG chewable tablet Chew 1 tablet by mouth daily as needed for indigestion or heartburn.   Yes [provider]  lisinopril  (ZESTRIL ) 40 MG tablet Take 1 tablet (40 mg total) by mouth daily. 12/19/23  Yes Pietro Redell RAMAN, MD  metFORMIN  (GLUCOPHAGE ) 1000 MG tablet Take 1 tablet (1,000 mg total) by mouth 2 (two) times daily with a meal. 11/15/23  Yes McGowen, Aleene DEL, MD  metoprolol  succinate (TOPROL -XL) 50 MG 24 hr tablet TAKE 1 TABLET(50 MG) BY MOUTH DAILY WITH OR IMMEDIATELY FOLLOWING A MEAL 08/23/23  Yes Pietro Redell RAMAN, MD  tamsulosin  (FLOMAX ) 0.4 MG  CAPS capsule TAKE 1 CAPSULE BY MOUTH EVERY DAY AFTER SUPPER 11/15/23  Yes McGowen, Aleene DEL, MD  aspirin  EC 81 MG tablet Take 81 mg by mouth daily.    [provider]  atorvastatin  (LIPITOR ) 80 MG tablet Take 1 tablet (80 mg total) by mouth every evening. 11/22/23   West, Katlyn D, NP  Dulaglutide  (TRULICITY ) 1.5 MG/0.5ML SOAJ Inject 1.5 mg into the skin once a week. 11/15/23   McGowen, Aleene DEL, MD  loratadine (CLARITIN) 10 MG tablet Take 10 mg by mouth as needed for allergies. 10/21/21   [provider]  nitroGLYCERIN  (NITROSTAT ) 0.4 MG SL tablet PLACE 1 TABLET UNDER THE TONGUE EVERY 5 (FIVE) MINUTES AS NEEDED FOR CHEST PAIN (UP TO 3 DOSES). Patient not taking: Reported on 02/06/2024 10/24/23   West, Katlyn D, NP  XARELTO  2.5 MG TABS tablet TAKE 1 TABLET(2.5 MG) BY MOUTH TWICE DAILY 08/23/23    Darron Deatrice LABOR, MD    Current Outpatient Medications  Medication Sig Dispense Refill   acetaminophen  (TYLENOL ) 500 MG tablet Take 1,000 mg by mouth daily as needed for mild pain or headache.     calcium  carbonate (TUMS - DOSED IN MG ELEMENTAL CALCIUM ) 500 MG chewable tablet Chew 1 tablet by mouth daily as needed for indigestion or heartburn.     lisinopril  (ZESTRIL ) 40 MG tablet Take 1 tablet (40 mg total) by mouth daily. 90 tablet 3   metFORMIN  (GLUCOPHAGE ) 1000 MG tablet Take 1 tablet (1,000 mg total) by mouth 2 (two) times daily with a meal. 180 tablet 3   metoprolol  succinate (TOPROL -XL) 50 MG 24 hr tablet TAKE 1 TABLET(50 MG) BY MOUTH DAILY WITH OR IMMEDIATELY FOLLOWING A MEAL 90 tablet 3   tamsulosin  (FLOMAX ) 0.4 MG CAPS capsule TAKE 1 CAPSULE BY MOUTH EVERY DAY AFTER SUPPER 90 capsule 3   aspirin  EC 81 MG tablet Take 81 mg by mouth daily.     atorvastatin  (LIPITOR ) 80 MG tablet Take 1 tablet (80 mg total) by mouth every evening. 90 tablet 3   Dulaglutide  (TRULICITY ) 1.5 MG/0.5ML SOAJ Inject 1.5 mg into the skin once a week. 2 mL 5   loratadine (CLARITIN) 10 MG tablet Take 10 mg by mouth as needed for allergies.     nitroGLYCERIN  (NITROSTAT ) 0.4 MG SL tablet PLACE 1 TABLET UNDER THE TONGUE EVERY 5 (FIVE) MINUTES AS NEEDED FOR CHEST PAIN (UP TO 3 DOSES). (Patient not taking: Reported on 02/06/2024) 25 tablet 3   XARELTO  2.5 MG TABS tablet TAKE 1 TABLET(2.5 MG) BY MOUTH TWICE DAILY 180 tablet 1   Current Facility-Administered Medications  Medication Dose Route Frequency Provider Last Rate Last Admin   0.9 %  sodium chloride  infusion  500 mL Intravenous Once Lanson Randle V, DO        Allergies as of 02/06/2024   (No Known Allergies)    Family History  Problem Relation Age of Onset   Hypertension Mother    Stroke Mother    Cancer Father        colon and prostate, mets to lungs.   Hypertension Father    Heart disease Brother        d. 66 MI   Multiple sclerosis Brother     Diabetes Brother    Heart disease Paternal Uncle    Diabetes Sister    Early death Brother    Colon cancer Neg Hx    Colon polyps Neg Hx    Esophageal cancer Neg Hx  Stomach cancer Neg Hx     Social History   Socioeconomic History   Marital status: Married    Spouse name: Not on file   Number of children: Not on file   Years of education: Not on file   Highest education level: 12th grade  Occupational History   Not on file  Tobacco Use   Smoking status: Former    Current packs/day: 0.00    Average packs/day: 0.3 packs/day for 40.0 years (10.0 ttl pk-yrs)    Types: Cigarettes    Start date: 09/05/1975    Quit date: 09/05/2015    Years since quitting: 8.4   Smokeless tobacco: Never  Vaping Use   Vaping status: Never Used  Substance and Sexual Activity   Alcohol use: No    Alcohol/week: 0.0 standard drinks of alcohol   Drug use: No   Sexual activity: Not on file  Other Topics Concern   Not on file  Social History Narrative   Married, 2 children, 4 grandchildren--[place].   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   Tob: 30 pack-yr hx, quit 08/2011.   No alcohol or drug use.   Active on farm but no formal exercise regimen.   Social Drivers of Corporate investment banker Strain: Low Risk  (09/20/2023)   Overall Financial Resource Strain (CARDIA)    Difficulty of Paying Living Expenses: Not hard at all  Food Insecurity: No Food Insecurity (09/20/2023)   Hunger Vital Sign    Worried About Running Out of Food in the Last Year: Never true    Ran Out of Food in the Last Year: Never true  Transportation Needs: No Transportation Needs (09/20/2023)   PRAPARE - Administrator, Civil Service (Medical): No    Lack of Transportation (Non-Medical): No  Physical Activity: Insufficiently Active (09/20/2023)   Exercise Vital Sign    Days of Exercise per  Week: 5 days    Minutes of Exercise per Session: 20 min  Stress: No Stress Concern Present (09/20/2023)   [employer] of Occupational Health - Occupational Stress Questionnaire    Feeling of Stress : Only a little  Social Connections: Moderately Integrated (09/20/2023)   Social Connection and Isolation Panel    Frequency of Communication with Friends and Family: More than three times a week    Frequency of Social Gatherings with Friends and Family: More than three times a week    Attends Religious Services: 1 to 4 times per year    Active Member of [employer] or Organizations: No    Attends Banker Meetings: Never    Marital Status: Married  Catering manager Violence: Not At Risk (09/20/2023)   Humiliation, Afraid, Rape, and Kick questionnaire    Fear of Current or Ex-Partner: No    Emotionally Abused: No    Physically Abused: No    Sexually Abused: No    Physical Exam: Vital signs in last 24 hours: @BP  119/78   Pulse 64   Temp (!) 97.3 F (36.3 C) (Temporal)   Ht 5' 7 (1.702 m)   Wt 170 lb (77.1 kg)   SpO2 96%   BMI 26.63 kg/m  GEN: NAD EYE: Sclerae anicteric ENT: MMM CV: Non-tachycardic Pulm: CTA b/l GI: Soft, NT/ND NEURO:  Alert & Oriented x 3   Sandor Flatter, DO [facility]   02/06/2024 10:19 AM

## 2024-02-07 ENCOUNTER — Telehealth: Payer: Self-pay | Admitting: *Deleted

## 2024-02-07 NOTE — Telephone Encounter (Signed)
  Follow up Call-     02/06/2024    9:44 AM  Call back number  Post procedure Call Back phone  # [phone]  Permission to leave phone message Yes     Patient questions:  Do you have a fever, pain , or abdominal swelling? No. Pain Score  0 *  Have you tolerated food without any problems? Yes.    Have you been able to return to your normal activities? Yes.    Do you have any questions about your discharge instructions: Diet   No. Medications  No. Follow up visit  No.  Do you have questions or concerns about your Care? No.  Actions: * If pain score is 4 or above: No action needed, pain <4.

## 2024-02-08 LAB — SURGICAL PATHOLOGY

## 2024-02-10 ENCOUNTER — Ambulatory Visit: Payer: Self-pay | Admitting: Cardiology

## 2024-02-14 ENCOUNTER — Encounter: Payer: Self-pay | Admitting: Family Medicine

## 2024-02-14 ENCOUNTER — Ambulatory Visit: Payer: Self-pay | Admitting: Cardiology

## 2024-02-14 ENCOUNTER — Ambulatory Visit (INDEPENDENT_AMBULATORY_CARE_PROVIDER_SITE_OTHER): Admitting: Family Medicine

## 2024-02-14 VITALS — BP 119/74 | HR 78 | Temp 97.9°F | Ht 67.0 in | Wt 159.4 lb

## 2024-02-14 DIAGNOSIS — I10 Essential (primary) hypertension: Secondary | ICD-10-CM | POA: Diagnosis not present

## 2024-02-14 DIAGNOSIS — E119 Type 2 diabetes mellitus without complications: Secondary | ICD-10-CM

## 2024-02-14 DIAGNOSIS — Z7984 Long term (current) use of oral hypoglycemic drugs: Secondary | ICD-10-CM | POA: Diagnosis not present

## 2024-02-14 DIAGNOSIS — K591 Functional diarrhea: Secondary | ICD-10-CM | POA: Diagnosis not present

## 2024-02-14 DIAGNOSIS — E7800 Pure hypercholesterolemia, unspecified: Secondary | ICD-10-CM

## 2024-02-14 DIAGNOSIS — R809 Proteinuria, unspecified: Secondary | ICD-10-CM

## 2024-02-14 LAB — POCT GLYCOSYLATED HEMOGLOBIN (HGB A1C)
HbA1c POC (<> result, manual entry): 7.2 % (ref 4.0–5.6)
HbA1c, POC (controlled diabetic range): 7.2 % — AB (ref 0.0–7.0)
HbA1c, POC (prediabetic range): 7.2 % — AB (ref 5.7–6.4)
Hemoglobin A1C: 7.2 % — AB (ref 4.0–5.6)

## 2024-02-14 LAB — BASIC METABOLIC PANEL WITH GFR
BUN: 14 mg/dL (ref 6–23)
CO2: 25 meq/L (ref 19–32)
Calcium: 9.4 mg/dL (ref 8.4–10.5)
Chloride: 106 meq/L (ref 96–112)
Creatinine, Ser: 1.15 mg/dL (ref 0.40–1.50)
GFR: 65.31 mL/min (ref >60.00)
Glucose, Bld: 155 mg/dL — ABNORMAL HIGH (ref 70–99)
Potassium: 4.3 meq/L (ref 3.5–5.1)
Sodium: 139 meq/L (ref 135–145)

## 2024-02-14 NOTE — Progress Notes (Signed)
 OFFICE VISIT  02/14/2024  CC:  Chief Complaint  Patient presents with   Medical Management of Chronic Issues    Pt is fasting    Patient is a 68 y.o. male who presents for 65-month follow-up diabetes, hypertension, and hyperlipidemia. A/P as of last visit: 1 diabetes without complication, control has worsened the last 6 months. POC Hba1c 7.3% today. He prefers to get back on track with his diet and activity level rather than add medications. Continue trulicity  1.5 mg subcu weekly. Continue metformin  1000 mg twice a day. Electrolytes and renal function were normal 3 weeks ago. Feet exam normal today.   2.  Hypertension, doing well on lisinopril  40 mg a day and Toprol -XL 50 mg a day. Electrolytes and creatinine normal 3 weeks ago.   #3 hypercholesterolemia. LDL goal less than 70.  Last LDL was 45 about 6 months ago. Continue atorvastatin  80 mg daily. Check lipids and hepatic panel today.  INTERIM HX: He got colonoscopy 02/06/2024. He had skipped his dose of Trulicity  prior to the procedure. About 3 or 4 days ago he started to get urgent loose BMs, a few per day, greenish-brown.  No blood in stool, no abdominal pain, no nocturnal stools. He has not had antibiotics recently.  He has not taken any antidiarrhea medication.  No home glucose monitoring.  Past Medical History:  Diagnosis Date   Allergy 2000p   Arthritis    Ascending aorta dilatation (HCC)    a. Ascending aorta mildly dilated by echo 09/03/15.   Bladder cancer [facility]) 2012   Mass removed, then BCG by urologist (Dr. Renda).  Recurrence documented on cysto + bladder washings 10/14/15--getting more maintenance BCG.  Cysto 01/20/16 OK, then more BCG given.  Cysto 04/13/16 c/w just having finished BCG but cytology/FISH ok.  Cysto 07/27/2016 c/w small area of BCG related inflamm, BCG stopped due to severe sx's. 12/2020 cysto benign   Bladder cancer (HCC)    Surveillance cysto 11/02/16 cysto showed 1.5 cm raised erythematous  lesion->bx benign. Subsequent cystoscopies unchanged, most recent 07/2019 and 04/2021.   BPH (benign prostatic hyperplasia)    with hx of acute prostatitis   CAD (coronary artery disease)    a. '05 MI > DES to circumflex. b. NSTEMI 08/2015 with total occlusion of the mid LAD along with 20% mid-distal Cx, 100% dCX, 40% D1, 50% pCx, 50% m-dRCA, 80% dRCA.Given >12 hours out from symptom onset, medical therapy recommended.   Diabetes mellitus type 2, controlled (HCC)    HbA1c 02/2011 was 6.6%   DVT (deep venous thrombosis) (HCC)    Elevated PSA 09/2011   with prostate nodule.  Prostate bx ALL BENIGN 10/10/11 (Dr. Lena followed by Urol--improved to 1.18 Jun 2017.  Nodule NOT palpable anymore on DRE at [facility] 01/2018 office f/u. Stable as of 08/2019. Slight incease 12/2020, urol to rpt 1 yr.   GERD (gastroesophageal reflux disease)    occasional   Headache(784.0)    cluster headaches a long time ago   History of adenomatous polyp of colon 09/23/2020   recall 09/2025   History of bronchitis    History of vertigo    HTN (hypertension)    Hyperlipidemia    Ischemic cardiomyopathy    a. 2D Echo 09/03/15: mild LVH, EF 40-45%, diffuse HK, akinesis of apical myocardium, grade 1 DD, aortic root dimention 38mm, ascending aorta mildly dilated, mildly dilated RV/RA.    Myocardial infarction [facility]) 2005; 08/2015   followed by Dr. Pietro   PAD (peripheral artery  disease) (HCC) 06/2019   R LL abnl ABI and doppler->see PSH section of EMR for details.   Thrombocytopenia (HCC)    Stable 2012-2017   Tobacco abuse    Wolf-Parkinson-White syndrome     Past Surgical History:  Procedure Laterality Date   BLADDER TUMOR EXCISION  11/08/10; 05/2015   urothelial carcinoma (Dr. Renda).  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   CARDIAC CATHETERIZATION  2005   with stent placement   CARDIAC CATHETERIZATION N/A 09/01/2015   Procedure: Left Heart Cath and Coronary  Angiography;  Surgeon: Victory LELON Sharps, MD;  Location: [facility] INVASIVE CV LAB;  Service: Cardiovascular;  Laterality: N/A;   COLONOSCOPY  2010; 09/23/20   2010 Dr. Lorinda. 09/2020 Dr. Dal 09/2025.   CYSTOSCOPY W/ RETROGRADES Bilateral 06/22/2015   Procedure: CYSTOSCOPY WITH RETROGRADE PYELOGRAM;  Surgeon: Gretel Renda, MD;  Location: [facility];  Service: Urology;  Laterality: Bilateral;   CYSTOSCOPY W/ RETROGRADES N/A 11/17/2016   Procedure: CYSTOSCOPY WITH RETROGRADE PYELOGRAM;  Surgeon: Gretel Renda, MD;  Location: [facility];  Service: Urology;  Laterality: N/A;   CYSTOSCOPY WITH RETROGRADE PYELOGRAM, URETEROSCOPY AND STENT PLACEMENT N/A 06/06/2013   Procedure: CYSTOSCOPY WITH RETROGRADE PYELOGRAM;  Surgeon: Noretta Renda, MD;  Location: [facility];  Service: Urology;  Laterality: N/A;   LE ABI vascular studies  06/2019   R LL claudication + evidence of PAD (ABI very low AND popliteal and peroneal artery occlusion on dopler u/s) in R leg (L leg fine)->referred to Dr. Darron.  Conservative mgmt with walking program and low dose xarelto  first, then consider revascularization.   lexiscan   2012   Normal/negative   LUMBAR SPINE SURGERY  1989 & 1991   Neurosurgeon in [place] ([facility])-no trouble since last surgery   myocardial perfusion study  2009   Neg ischemia, EF 52%   TRANSTHORACIC ECHOCARDIOGRAM     09/2022 EF 50-55%, apical hypokinesis, indeterm diast fxn, AV sclerosis w/out stenosis.  Ascending aorta dilation to 38 mm   TRANSURETHRAL RESECTION OF BLADDER TUMOR N/A 06/06/2013   Procedure: TRANSURETHRAL RESECTION OF BLADDER TUMOR (TURBT);  Surgeon: Noretta Renda, MD;  Location: [facility];  Service: Urology;  Laterality: N/A;.  PATH: squamous metaplasia, fibrosis with chronic inflammation, no malignancy.   TRANSURETHRAL RESECTION OF BLADDER TUMOR N/A 06/22/2015   Carcinoma in situ: no invasive malignancy.  Procedure: TRANSURETHRAL RESECTION OF BLADDER TUMOR (TURBT);  Surgeon: Gretel Renda, MD;   Location: [facility];  Service: Urology;  Laterality: N/A;  **BLADDER BIOPSY*RESECTION**   TRANSURETHRAL RESECTION OF BLADDER TUMOR N/A 11/17/2016   Path: BENIGN UROTHELIUM WITH INFLAMMATION--no malignancy. Procedure: TRANSURETHRAL RESECTION OF BLADDER TUMOR (TURBT);  Surgeon: Gretel Renda, MD;  Location: [facility];  Service: Urology;  Laterality: N/A;   VASECTOMY      Outpatient Medications Prior to Visit  Medication Sig Dispense Refill   acetaminophen  (TYLENOL ) 500 MG tablet Take 1,000 mg by mouth daily as needed for mild pain or headache.     aspirin  EC 81 MG tablet Take 81 mg by mouth daily.     atorvastatin  (LIPITOR ) 80 MG tablet Take 1 tablet (80 mg total) by mouth every evening. 90 tablet 3   calcium  carbonate (TUMS - DOSED IN MG ELEMENTAL CALCIUM ) 500 MG chewable tablet Chew 1 tablet by mouth daily as needed for indigestion or heartburn.     Dulaglutide  (TRULICITY ) 1.5 MG/0.5ML SOAJ Inject 1.5 mg into the skin once a week. 2 mL 5   lisinopril  (ZESTRIL ) 40 MG tablet Take  1 tablet (40 mg total) by mouth daily. 90 tablet 3   loratadine (CLARITIN) 10 MG tablet Take 10 mg by mouth as needed for allergies.     metFORMIN  (GLUCOPHAGE ) 1000 MG tablet Take 1 tablet (1,000 mg total) by mouth 2 (two) times daily with a meal. 180 tablet 3   metoprolol  succinate (TOPROL -XL) 50 MG 24 hr tablet TAKE 1 TABLET(50 MG) BY MOUTH DAILY WITH OR IMMEDIATELY FOLLOWING A MEAL 90 tablet 3   nitroGLYCERIN  (NITROSTAT ) 0.4 MG SL tablet PLACE 1 TABLET UNDER THE TONGUE EVERY 5 (FIVE) MINUTES AS NEEDED FOR CHEST PAIN (UP TO 3 DOSES). (Patient not taking: Reported on 02/06/2024) 25 tablet 3   tamsulosin  (FLOMAX ) 0.4 MG CAPS capsule TAKE 1 CAPSULE BY MOUTH EVERY DAY AFTER SUPPER 90 capsule 3   XARELTO  2.5 MG TABS tablet TAKE 1 TABLET(2.5 MG) BY MOUTH TWICE DAILY 180 tablet 1   No facility-administered medications prior to visit.    No Known Allergies  Review of Systems As per HPI  PE:    02/14/2024    9:09 AM 02/06/2024    11:16 AM 02/06/2024   11:06 AM  Vitals with BMI  Height 5' 7    Weight 159 lbs 6 oz    BMI 24.96    Systolic 119 117 888  Diastolic 74 73 73  Pulse 78 70 63     Physical Exam  Gen: Alert, well appearing.  Patient is oriented to person, place, time, and situation. AFFECT: pleasant, lucid thought and speech. CV: RRR, no m/r/g.   LUNGS: CTA bilat, nonlabored resps, good aeration in all lung fields. ABD: Soft, nontender and nondistended.  Bowel sounds normal. Extremities: No edema  LABS:  Last CBC Lab Results  Component Value Date   WBC 8.3 10/24/2023   HGB 15.9 10/24/2023   HCT 46.7 10/24/2023   MCV 93 10/24/2023   MCH 31.7 10/24/2023   RDW 13.1 10/24/2023   PLT 137 (L) 10/24/2023   Last metabolic panel Lab Results  Component Value Date   GLUCOSE 141 (H) 10/24/2023   NA 138 10/24/2023   K 5.0 10/24/2023   CL 98 10/24/2023   CO2 23 10/24/2023   BUN 15 10/24/2023   CREATININE 1.16 10/24/2023   EGFR 69 10/24/2023   CALCIUM  9.5 10/24/2023   PROT 6.8 11/15/2023   ALBUMIN 4.4 11/15/2023   BILITOT 1.2 11/15/2023   ALKPHOS 74 11/15/2023   AST 17 11/15/2023   ALT 14 11/15/2023   ANIONGAP 9 11/08/2016   Last lipids Lab Results  Component Value Date   CHOL 88 11/15/2023   HDL 26.80 (L) 11/15/2023   LDLCALC 38 11/15/2023   LDLDIRECT 87.2 09/03/2010   TRIG 115.0 11/15/2023   CHOLHDL 3 11/15/2023   Last hemoglobin A1c Lab Results  Component Value Date   HGBA1C 7.2 (A) 02/14/2024   HGBA1C 7.2 02/14/2024   HGBA1C 7.2 (A) 02/14/2024   HGBA1C 7.2 (A) 02/14/2024   Last thyroid  functions Lab Results  Component Value Date   TSH 0.96 08/26/2015   IMPRESSION AND PLAN:  #1 diabetes without complication, control has worsened the last 9 months. POC Hba1c stable at 7.2% today. He prefers to get back on track with his diet and activity level rather than add medications. Continue trulicity  1.5 mg subcu weekly. Continue metformin  1000 mg twice a day. Check renal  function today.  2.  Hypertension, doing well on lisinopril  40 mg a day and Toprol -XL 50 mg a day. Electrolytes and creatinine  monitoring today.  #3 hypercholesterolemia. LDL goal less than 70.  Last LDL was 38 about 3 months ago. Continue atorvastatin  80 mg daily. Rpt lipids 3 mo.  #4 acute diarrhea. At this point we will give it some time, assuming he may have have some recent gastrointestinal hyperreactivity from his colonoscopy and recent restart of the Trulicity .  An After Visit Summary was printed and given to the patient.  FOLLOW UP: Return in about 3 months (around 05/16/2024) for routine chronic illness f/u. Next cpe 10/2024 Signed:  Gerlene Hockey, MD           02/14/2024

## 2024-02-15 ENCOUNTER — Other Ambulatory Visit

## 2024-02-15 ENCOUNTER — Encounter: Payer: Self-pay | Admitting: Cardiology

## 2024-02-15 LAB — MICROALBUMIN / CREATININE URINE RATIO
Creatinine,U: 107.5 mg/dL
Microalb Creat Ratio: 233.5 mg/g — ABNORMAL HIGH (ref 0.0–30.0)
Microalb, Ur: 25.1 mg/dL — ABNORMAL HIGH (ref 0.0–1.9)

## 2024-03-09 ENCOUNTER — Other Ambulatory Visit: Payer: Self-pay | Admitting: Cardiovascular Disease

## 2024-03-09 DIAGNOSIS — I2510 Atherosclerotic heart disease of native coronary artery without angina pectoris: Secondary | ICD-10-CM

## 2024-03-18 ENCOUNTER — Other Ambulatory Visit

## 2024-03-18 DIAGNOSIS — R809 Proteinuria, unspecified: Secondary | ICD-10-CM

## 2024-03-19 ENCOUNTER — Encounter: Payer: Self-pay | Admitting: Cardiovascular Disease

## 2024-03-21 LAB — MICROALBUMIN / CREATININE URINE RATIO
Creatinine, Urine: 123.5 mg/dL
Microalb/Creat Ratio: 137 mg/g{creat} — ABNORMAL HIGH (ref 0–29)
Microalbumin, Urine: 169 ug/mL

## 2024-03-22 MED ORDER — LISINOPRIL 5 MG PO TABS: 5.0000 mg | ORAL_TABLET | Freq: Every day | ORAL | 1 refills | Status: DC

## 2024-03-22 NOTE — Telephone Encounter (Signed)
 Please note 5 patient that there is still protein in his urine, although the amount has decreased since last check. Please send in a prescription for lisinopril  5 mg, 1 tab daily, #90, refill x 1. Needs basic metabolic panel in 1 week, diagnosis microalbuminuria. Also, please order referral to nephrology, same diagnosis.

## 2024-03-22 NOTE — Progress Notes (Signed)
 Continue with the 40 mg lisinopril .  Do not add the 5 mg. We will proceed with referral to nephrology to see if they recommend anything additional. Please order referral.

## 2024-03-27 ENCOUNTER — Other Ambulatory Visit (INDEPENDENT_AMBULATORY_CARE_PROVIDER_SITE_OTHER)

## 2024-03-27 DIAGNOSIS — R809 Proteinuria, unspecified: Secondary | ICD-10-CM | POA: Diagnosis not present

## 2024-03-27 NOTE — Telephone Encounter (Signed)
 Pt advised of recommendations.

## 2024-03-28 LAB — BASIC METABOLIC PANEL WITH GFR
BUN/Creatinine Ratio: 15 (ref 10–24)
BUN: 16 mg/dL (ref 8–27)
CO2: 20 mmol/L (ref 20–29)
Calcium: 9.8 mg/dL (ref 8.6–10.2)
Chloride: 103 mmol/L (ref 96–106)
Creatinine, Ser: 1.07 mg/dL (ref 0.76–1.27)
Glucose: 144 mg/dL — ABNORMAL HIGH (ref 70–99)
Potassium: 4.6 mmol/L (ref 3.5–5.2)
Sodium: 138 mmol/L (ref 134–144)
eGFR: 76 mL/min/1.73 (ref >59)

## 2024-05-01 DIAGNOSIS — N182 Chronic kidney disease, stage 2 (mild): Secondary | ICD-10-CM | POA: Diagnosis not present

## 2024-05-01 DIAGNOSIS — I739 Peripheral vascular disease, unspecified: Secondary | ICD-10-CM | POA: Diagnosis not present

## 2024-05-01 DIAGNOSIS — I129 Hypertensive chronic kidney disease with stage 1 through stage 4 chronic kidney disease, or unspecified chronic kidney disease: Secondary | ICD-10-CM | POA: Diagnosis not present

## 2024-05-01 DIAGNOSIS — N400 Enlarged prostate without lower urinary tract symptoms: Secondary | ICD-10-CM | POA: Diagnosis not present

## 2024-05-16 ENCOUNTER — Encounter: Payer: Self-pay | Admitting: Family Medicine

## 2024-05-16 ENCOUNTER — Ambulatory Visit: Payer: Self-pay | Admitting: Family Medicine

## 2024-05-16 ENCOUNTER — Ambulatory Visit: Admitting: Family Medicine

## 2024-05-16 VITALS — BP 128/75 | HR 70 | Temp 97.6°F | Ht 67.0 in | Wt 164.4 lb

## 2024-05-16 DIAGNOSIS — M170 Bilateral primary osteoarthritis of knee: Secondary | ICD-10-CM

## 2024-05-16 DIAGNOSIS — Z7985 Long-term (current) use of injectable non-insulin antidiabetic drugs: Secondary | ICD-10-CM | POA: Diagnosis not present

## 2024-05-16 DIAGNOSIS — Z7984 Long term (current) use of oral hypoglycemic drugs: Secondary | ICD-10-CM | POA: Diagnosis not present

## 2024-05-16 DIAGNOSIS — R809 Proteinuria, unspecified: Secondary | ICD-10-CM | POA: Diagnosis not present

## 2024-05-16 DIAGNOSIS — M25562 Pain in left knee: Secondary | ICD-10-CM

## 2024-05-16 DIAGNOSIS — I10 Essential (primary) hypertension: Secondary | ICD-10-CM | POA: Diagnosis not present

## 2024-05-16 DIAGNOSIS — G8929 Other chronic pain: Secondary | ICD-10-CM

## 2024-05-16 DIAGNOSIS — E7800 Pure hypercholesterolemia, unspecified: Secondary | ICD-10-CM

## 2024-05-16 DIAGNOSIS — E119 Type 2 diabetes mellitus without complications: Secondary | ICD-10-CM | POA: Diagnosis not present

## 2024-05-16 DIAGNOSIS — E1129 Type 2 diabetes mellitus with other diabetic kidney complication: Secondary | ICD-10-CM | POA: Diagnosis not present

## 2024-05-16 DIAGNOSIS — M25561 Pain in right knee: Secondary | ICD-10-CM

## 2024-05-16 LAB — POCT GLYCOSYLATED HEMOGLOBIN (HGB A1C)
HbA1c POC (<> result, manual entry): 6.6 % (ref 4.0–5.6)
HbA1c, POC (controlled diabetic range): 6.6 % (ref 0.0–7.0)
HbA1c, POC (prediabetic range): 6.6 % — AB (ref 5.7–6.4)
Hemoglobin A1C: 6.6 % — AB (ref 4.0–5.6)

## 2024-05-16 LAB — LIPID PANEL
Cholesterol: 92 mg/dL (ref 0–200)
HDL: 33.5 mg/dL — ABNORMAL LOW (ref >39.00)
LDL Cholesterol: 41 mg/dL (ref 0–99)
NonHDL: 58.3
Total CHOL/HDL Ratio: 3
Triglycerides: 85 mg/dL (ref 0.0–149.0)
VLDL: 17 mg/dL (ref 0.0–40.0)

## 2024-05-16 LAB — COMPREHENSIVE METABOLIC PANEL WITH GFR
ALT: 15 U/L (ref 0–53)
AST: 18 U/L (ref 0–37)
Albumin: 4.3 g/dL (ref 3.5–5.2)
Alkaline Phosphatase: 67 U/L (ref 39–117)
BUN: 16 mg/dL (ref 6–23)
CO2: 27 meq/L (ref 19–32)
Calcium: 9.4 mg/dL (ref 8.4–10.5)
Chloride: 103 meq/L (ref 96–112)
Creatinine, Ser: 1.02 mg/dL (ref 0.40–1.50)
GFR: 75.29 mL/min (ref >60.00)
Glucose, Bld: 137 mg/dL — ABNORMAL HIGH (ref 70–99)
Potassium: 4.8 meq/L (ref 3.5–5.1)
Sodium: 138 meq/L (ref 135–145)
Total Bilirubin: 0.7 mg/dL (ref 0.2–1.2)
Total Protein: 6.4 g/dL (ref 6.0–8.3)

## 2024-05-16 LAB — MICROALBUMIN / CREATININE URINE RATIO
Creatinine,U: 118.6 mg/dL
Microalb Creat Ratio: 241.4 mg/g — ABNORMAL HIGH (ref 0.0–30.0)
Microalb, Ur: 28.6 mg/dL — ABNORMAL HIGH (ref 0.0–1.9)

## 2024-05-16 NOTE — Progress Notes (Signed)
 OFFICE VISIT  05/16/2024  CC:  Chief Complaint  Patient presents with   Medical Management of Chronic Issues    Pt is fasting    Patient is a 68 y.o. male who presents for 79-month follow-up diabetes, hypertension, and hypercholesterolemia. A/P as of last visit: #1 diabetes without complication, control has worsened the last 9 months. POC Hba1c stable at 7.2% today. He prefers to get back on track with his diet and activity level rather than add medications. Continue trulicity  1.5 mg subcu weekly. Continue metformin  1000 mg twice a day. Check renal function today.   2.  Hypertension, doing well on lisinopril  40 mg a day and Toprol -XL 50 mg a day. Electrolytes and creatinine monitoring today.   #3 hypercholesterolemia. LDL goal less than 70.  Last LDL was 38 about 3 months ago. Continue atorvastatin  80 mg daily. Rpt lipids 3 mo.   #4 acute diarrhea. At this point we will give it some time, assuming he may have have some recent gastrointestinal hyperreactivity from his colonoscopy and recent restart of the Trulicity .  INTERIM HX: Feeling pretty well other than chronic bilateral knee pain.  ROS --> no fevers, no CP, no SOB, no wheezing, no cough, no dizziness, no HAs, no rashes, no melena/hematochezia.  No polyuria or polydipsia.   No focal weakness  or tremors.  No acute vision or hearing abnormalities.  No dysuria or unusual/new urinary urgency or frequency.  No recent changes in lower legs. No n/v/d or abd pain.  No palpitations.    Past Medical History:  Diagnosis Date   Allergy 2000p   Arthritis    Ascending aorta dilatation    a. Ascending aorta mildly dilated by echo 09/03/15.   Bladder cancer [facility]) 2012   Mass removed, then BCG by urologist (Dr. Renda).  Recurrence documented on cysto + bladder washings 10/14/15--getting more maintenance BCG.  Cysto 01/20/16 OK, then more BCG given.  Cysto 04/13/16 c/w just having finished BCG but cytology/FISH ok.  Cysto 07/27/2016 c/w  small area of BCG related inflamm, BCG stopped due to severe sx's. 12/2020 cysto benign   Bladder cancer (HCC)    Surveillance cysto 11/02/16 cysto showed 1.5 cm raised erythematous lesion->bx benign. Subsequent cystoscopies unchanged, most recent 07/2019 and 04/2021.   BPH (benign prostatic hyperplasia)    with hx of acute prostatitis   CAD (coronary artery disease)    a. '05 MI > DES to circumflex. b. NSTEMI 08/2015 with total occlusion of the mid LAD along with 20% mid-distal Cx, 100% dCX, 40% D1, 50% pCx, 50% m-dRCA, 80% dRCA.Given >12 hours out from symptom onset, medical therapy recommended.   Diabetes mellitus type 2, controlled (HCC)    HbA1c 02/2011 was 6.6%   DVT (deep venous thrombosis) (HCC)    Elevated PSA 09/2011   with prostate nodule.  Prostate bx ALL BENIGN 10/10/11 (Dr. Lena followed by Urol--improved to 1.18 Jun 2017.  Nodule NOT palpable anymore on DRE at [facility] 01/2018 office f/u. Stable as of 08/2019. Slight incease 12/2020, urol to rpt 1 yr.   GERD (gastroesophageal reflux disease)    occasional   History of adenomatous polyp of colon 09/23/2020   recall 09/2025   History of bronchitis    History of vertigo    HTN (hypertension)    Hyperlipidemia    Ischemic cardiomyopathy    a. 2D Echo 09/03/15: mild LVH, EF 40-45%, diffuse HK, akinesis of apical myocardium, grade 1 DD, aortic root dimention 38mm, ascending aorta mildly dilated, mildly  dilated RV/RA.    Myocardial infarction [facility]) 2005; 08/2015   followed by Dr. Pietro   PAD (peripheral artery disease) 06/2019   R LL abnl ABI and doppler->see PSH section of EMR for details.   Thrombocytopenia    Stable 2012-2017   Tobacco abuse    Wolf-Parkinson-White syndrome     Past Surgical History:  Procedure Laterality Date   BLADDER TUMOR EXCISION  11/08/10; 05/2015   urothelial carcinoma (Dr. Renda).  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   CARDIAC CATHETERIZATION  2005   with  stent placement   CARDIAC CATHETERIZATION N/A 09/01/2015   Procedure: Left Heart Cath and Coronary Angiography;  Surgeon: Victory LELON Sharps, MD;  Location: [facility] INVASIVE CV LAB;  Service: Cardiovascular;  Laterality: N/A;   COLONOSCOPY  2010; 09/23/20   2010 Dr. Lorinda. 09/2020 Dr. Dal 09/2025.   CYSTOSCOPY W/ RETROGRADES Bilateral 06/22/2015   Procedure: CYSTOSCOPY WITH RETROGRADE PYELOGRAM;  Surgeon: Gretel Renda, MD;  Location: [facility];  Service: Urology;  Laterality: Bilateral;   CYSTOSCOPY W/ RETROGRADES N/A 11/17/2016   Procedure: CYSTOSCOPY WITH RETROGRADE PYELOGRAM;  Surgeon: Gretel Renda, MD;  Location: [facility];  Service: Urology;  Laterality: N/A;   CYSTOSCOPY WITH RETROGRADE PYELOGRAM, URETEROSCOPY AND STENT PLACEMENT N/A 06/06/2013   Procedure: CYSTOSCOPY WITH RETROGRADE PYELOGRAM;  Surgeon: Noretta Renda, MD;  Location: [facility];  Service: Urology;  Laterality: N/A;   LE ABI vascular studies  06/2019   R LL claudication + evidence of PAD (ABI very low AND popliteal and peroneal artery occlusion on dopler u/s) in R leg (L leg fine)->referred to Dr. Darron.  Conservative mgmt with walking program and low dose xarelto  first, then consider revascularization.   lexiscan   2012   Normal/negative   LUMBAR SPINE SURGERY  1989 & 1991   Neurosurgeon in [place] ([facility]ritzer)-no trouble since last surgery   myocardial perfusion study  2009   Neg ischemia, EF 52%   TRANSTHORACIC ECHOCARDIOGRAM     09/2022 EF 50-55%, apical hypokinesis, indeterm diast fxn, AV sclerosis w/out stenosis.  Ascending aorta dilation to 38 mm   TRANSURETHRAL RESECTION OF BLADDER TUMOR N/A 06/06/2013   Procedure: TRANSURETHRAL RESECTION OF BLADDER TUMOR (TURBT);  Surgeon: Noretta Renda, MD;  Location: [facility];  Service: Urology;  Laterality: N/A;.  PATH: squamous metaplasia, fibrosis with chronic inflammation, no malignancy.   TRANSURETHRAL RESECTION OF BLADDER TUMOR N/A 06/22/2015   Carcinoma in situ: no invasive  malignancy.  Procedure: TRANSURETHRAL RESECTION OF BLADDER TUMOR (TURBT);  Surgeon: Gretel Renda, MD;  Location: [facility];  Service: Urology;  Laterality: N/A;  **BLADDER BIOPSY*RESECTION**   TRANSURETHRAL RESECTION OF BLADDER TUMOR N/A 11/17/2016   Path: BENIGN UROTHELIUM WITH INFLAMMATION--no malignancy. Procedure: TRANSURETHRAL RESECTION OF BLADDER TUMOR (TURBT);  Surgeon: Gretel Renda, MD;  Location: [facility];  Service: Urology;  Laterality: N/A;   VASECTOMY      Outpatient Medications Prior to Visit  Medication Sig Dispense Refill   acetaminophen  (TYLENOL ) 500 MG tablet Take 1,000 mg by mouth daily as needed for mild pain or headache.     aspirin  EC 81 MG tablet Take 81 mg by mouth daily.     atorvastatin  (LIPITOR ) 80 MG tablet Take 1 tablet (80 mg total) by mouth every evening. 90 tablet 3   calcium  carbonate (TUMS - DOSED IN MG ELEMENTAL CALCIUM ) 500 MG chewable tablet Chew 1 tablet by mouth daily as needed for indigestion or heartburn.     Dulaglutide  (TRULICITY ) 1.5 MG/0.5ML SOAJ Inject  1.5 mg into the skin once a week. 2 mL 5   lisinopril  (ZESTRIL ) 40 MG tablet Take 40 mg by mouth daily.     metFORMIN  (GLUCOPHAGE ) 1000 MG tablet Take 1 tablet (1,000 mg total) by mouth 2 (two) times daily with a meal. 180 tablet 3   metoprolol  succinate (TOPROL -XL) 50 MG 24 hr tablet TAKE 1 TABLET(50 MG) BY MOUTH DAILY WITH OR IMMEDIATELY FOLLOWING A MEAL 90 tablet 3   tamsulosin  (FLOMAX ) 0.4 MG CAPS capsule TAKE 1 CAPSULE BY MOUTH EVERY DAY AFTER SUPPER 90 capsule 3   XARELTO  2.5 MG TABS tablet TAKE 1 TABLET(2.5 MG) BY MOUTH TWICE DAILY 180 tablet 1   nitroGLYCERIN  (NITROSTAT ) 0.4 MG SL tablet PLACE 1 TABLET UNDER THE TONGUE EVERY 5 (FIVE) MINUTES AS NEEDED FOR CHEST PAIN (UP TO 3 DOSES). (Patient not taking: Reported on 05/16/2024) 25 tablet 3   lisinopril  (ZESTRIL ) 5 MG tablet Take 1 tablet (5 mg total) by mouth daily. 90 tablet 1   loratadine (CLARITIN) 10 MG tablet Take 10 mg by mouth as needed for  allergies.     No facility-administered medications prior to visit.    No Known Allergies  Review of Systems As per HPI  PE:    05/16/2024    8:28 AM 02/14/2024    9:09 AM 02/06/2024   11:16 AM  Vitals with BMI  Height 5' 7 5' 7   Weight 164 lbs 6 oz 159 lbs 6 oz   BMI 25.74 24.96   Systolic 128 119 882  Diastolic 75 74 73  Pulse 70 78 70     Physical Exam  General: Alert and well-appearing. Affect is pleasant, thought and speech are lucid. No further exam today  LABS:  Last CBC Lab Results  Component Value Date   WBC 8.3 10/24/2023   HGB 15.9 10/24/2023   HCT 46.7 10/24/2023   MCV 93 10/24/2023   MCH 31.7 10/24/2023   RDW 13.1 10/24/2023   PLT 137 (L) 10/24/2023   Last metabolic panel Lab Results  Component Value Date   GLUCOSE 144 (H) 03/27/2024   NA 138 03/27/2024   K 4.6 03/27/2024   CL 103 03/27/2024   CO2 20 03/27/2024   BUN 16 03/27/2024   CREATININE 1.07 03/27/2024   EGFR 76 03/27/2024   CALCIUM  9.8 03/27/2024   PROT 6.8 11/15/2023   ALBUMIN 4.4 11/15/2023   BILITOT 1.2 11/15/2023   ALKPHOS 74 11/15/2023   AST 17 11/15/2023   ALT 14 11/15/2023   ANIONGAP 9 11/08/2016   Last lipids Lab Results  Component Value Date   CHOL 88 11/15/2023   HDL 26.80 (L) 11/15/2023   LDLCALC 38 11/15/2023   LDLDIRECT 87.2 09/03/2010   TRIG 115.0 11/15/2023   CHOLHDL 3 11/15/2023   Last hemoglobin A1c Lab Results  Component Value Date   HGBA1C 6.6 (A) 05/16/2024   HGBA1C 6.6 05/16/2024   HGBA1C 6.6 (A) 05/16/2024   HGBA1C 6.6 05/16/2024   Last thyroid  functions Lab Results  Component Value Date   TSH 0.96 08/26/2015   IMPRESSION AND PLAN:  #1 diabetes with albuminuria. POC Hba1c today is 6.6%. His albuminuria has been pretty stable over the last couple of years. He did establish with nephrologist a few weeks ago.  No new medication was started but Bernadine was mentioned.  Patient wants to hold off on Jardiance right now.  If we did start  this we would likely cut back his dose of metformin . For now  continue metformin  1000 mg twice a day and Trulicity  1.5 mg weekly.  Also continue lisinopril  40 mg a day. Monitor renal function today and recheck urine microalbumin/creatinine.  2.  Hypertension, well-controlled on lisinopril  40 mg a day and Toprol -XL 50 mg daily. Electrolytes and creatinine today.  3.hypercholesterolemia. LDL goal less than 70.  Last LDL was 38 about 6 months ago. Continue atorvastatin  80 mg daily. Check lipid panel and hepatic panel today.  #4 chronic bilateral knee pain.  History of significant osteoarthritis.  He had established with an orthopedist years ago and says the plan was for knee replacement but his insurer would not cover it. He would like to reestablish with an orthopedist--> referral today.  An After Visit Summary was printed and given to the patient.  FOLLOW UP: Return in about 3 months (around 08/16/2024) for routine chronic illness f/u. Next CPE for 2026 Signed:  Gerlene Hockey, MD           05/16/2024

## 2024-05-21 ENCOUNTER — Other Ambulatory Visit: Payer: Self-pay

## 2024-05-21 ENCOUNTER — Other Ambulatory Visit: Payer: Self-pay | Admitting: Cardiovascular Disease

## 2024-05-21 DIAGNOSIS — I2510 Atherosclerotic heart disease of native coronary artery without angina pectoris: Secondary | ICD-10-CM

## 2024-05-21 MED ORDER — TRULICITY 1.5 MG/0.5ML ~~LOC~~ SOAJ: 1.5000 mg | SUBCUTANEOUS | 5 refills | Status: DC

## 2024-05-21 NOTE — Telephone Encounter (Addendum)
 Prescription refill request for Xarelto  received.  Indication: PAD Last office visit: 10/24/23  Mark Mose NP Weight: 77.5kg Age: 68 Scr: 1.02 on 05/16/24  Epic CrCl: 75.98  Based on above findings Xarelto  2.5 twice daily is the appropriate dose.  Refill approved.

## 2024-06-03 DIAGNOSIS — M170 Bilateral primary osteoarthritis of knee: Secondary | ICD-10-CM | POA: Diagnosis not present

## 2024-06-04 ENCOUNTER — Ambulatory Visit: Admitting: Cardiovascular Disease

## 2024-06-10 NOTE — Progress Notes (Unsigned)
 Cardiology Office Note   Date:  06/11/2024   ID:  Mark Blankenship, DOB July 27, 1955, MRN [id number]  PCP:  Candise Aleene DEL, MD  Cardiologist:   Dr. Pietro  No chief complaint on file.      History of Present Illness: Mark Blankenship is a 68 y.o. male who is here today for follow-up visit regarding peripheral arterial disease.   The patient has known history of coronary artery disease with previous myocardial infarction with an occluded LAD with late presentation, ischemic cardiomyopathy with an EF of 40 to 45%, type 2 diabetes for at least 20 years, essential hypertension and hyperlipidemia.  He is not a smoker but does have strong family history of coronary artery disease.   He is followed for right calf claudication with moderately reduced ABI due to an occluded popliteal artery. He has been treated medically and his symptoms improved with walking.  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   He has minimal right calf claudication at this point.  He denies any chest pain or shortness of breath.  He does report limitations related to knee arthritis requiring recent steroid injection.   Past Medical History:  Diagnosis Date   Allergy 2000p   Arthritis    Ascending aorta dilatation    a. Ascending aorta mildly dilated by echo 09/03/15.   Bladder cancer [facility]) 2012   Mass removed, then BCG by urologist (Dr. Renda).  Recurrence documented on cysto + bladder washings 10/14/15--getting more maintenance BCG.  Cysto 01/20/16 OK, then more BCG given.  Cysto 04/13/16 c/w just having finished BCG but cytology/FISH ok.  Cysto 07/27/2016 c/w small area of BCG related inflamm, BCG stopped due to severe sx's. 12/2020 cysto benign   Bladder cancer (HCC)    Surveillance cysto 11/02/16 cysto showed 1.5 cm raised erythematous lesion->bx benign. Subsequent cystoscopies unchanged, most recent 07/2019 and 04/2021.   BPH (benign prostatic hyperplasia)    with hx of acute prostatitis   CAD (coronary artery  disease)    a. '05 MI > DES to circumflex. b. NSTEMI 08/2015 with total occlusion of the mid LAD along with 20% mid-distal Cx, 100% dCX, 40% D1, 50% pCx, 50% m-dRCA, 80% dRCA.Given >12 hours out from symptom onset, medical therapy recommended.   Diabetes mellitus type 2, controlled (HCC)    HbA1c 02/2011 was 6.6%   DVT (deep venous thrombosis) (HCC)    Elevated PSA 09/2011   with prostate nodule.  Prostate bx ALL BENIGN 10/10/11 (Dr. Lena followed by Urol--improved to 1.18 Jun 2017.  Nodule NOT palpable anymore on DRE at [facility] 01/2018 office f/u. Stable as of 08/2019. Slight incease 12/2020, urol to rpt 1 yr.   GERD (gastroesophageal reflux disease)    occasional   History of adenomatous polyp of colon 09/23/2020   recall 09/2025   History of bronchitis    History of vertigo    HTN (hypertension)    Hyperlipidemia    Ischemic cardiomyopathy    a. 2D Echo 09/03/15: mild LVH, EF 40-45%, diffuse HK, akinesis of apical myocardium, grade 1 DD, aortic root dimention 38mm, ascending aorta mildly dilated, mildly dilated RV/RA.    Myocardial infarction [facility]) 2005; 08/2015   followed by Dr. Pietro   PAD (peripheral artery disease) 06/2019   R LL abnl ABI and doppler->see PSH section of EMR for details.   Thrombocytopenia    Stable 2012-2017   Tobacco abuse    Wolf-Parkinson-White syndrome     Past Surgical History:  Procedure Laterality Date   BLADDER TUMOR EXCISION  11/08/10; 05/2015   urothelial carcinoma (Dr. Renda).  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   CARDIAC CATHETERIZATION  2005   with stent placement   CARDIAC CATHETERIZATION N/A 09/01/2015   Procedure: Left Heart Cath and Coronary Angiography;  Surgeon: Victory LELON Sharps, MD;  Location: [facility] INVASIVE CV LAB;  Service: Cardiovascular;  Laterality: N/A;   COLONOSCOPY  2010; 09/23/20   2010 Dr. Lorinda. 09/2020 Dr. Dal 09/2025.   CYSTOSCOPY W/ RETROGRADES Bilateral  06/22/2015   Procedure: CYSTOSCOPY WITH RETROGRADE PYELOGRAM;  Surgeon: Gretel Renda, MD;  Location: [facility];  Service: Urology;  Laterality: Bilateral;   CYSTOSCOPY W/ RETROGRADES N/A 11/17/2016   Procedure: CYSTOSCOPY WITH RETROGRADE PYELOGRAM;  Surgeon: Gretel Renda, MD;  Location: [facility];  Service: Urology;  Laterality: N/A;   CYSTOSCOPY WITH RETROGRADE PYELOGRAM, URETEROSCOPY AND STENT PLACEMENT N/A 06/06/2013   Procedure: CYSTOSCOPY WITH RETROGRADE PYELOGRAM;  Surgeon: Noretta Renda, MD;  Location: [facility];  Service: Urology;  Laterality: N/A;   LE ABI vascular studies  06/2019   R LL claudication + evidence of PAD (ABI very low AND popliteal and peroneal artery occlusion on dopler u/s) in R leg (L leg fine)->referred to Dr. Darron.  Conservative mgmt with walking program and low dose xarelto  first, then consider revascularization.   lexiscan   2012   Normal/negative   LUMBAR SPINE SURGERY  1989 & 1991   Neurosurgeon in [place] ([facility])-no trouble since last surgery   myocardial perfusion study  2009   Neg ischemia, EF 52%   TRANSTHORACIC ECHOCARDIOGRAM     09/2022 EF 50-55%, apical hypokinesis, indeterm diast fxn, AV sclerosis w/out stenosis.  Ascending aorta dilation to 38 mm   TRANSURETHRAL RESECTION OF BLADDER TUMOR N/A 06/06/2013   Procedure: TRANSURETHRAL RESECTION OF BLADDER TUMOR (TURBT);  Surgeon: Noretta Renda, MD;  Location: [facility];  Service: Urology;  Laterality: N/A;.  PATH: squamous metaplasia, fibrosis with chronic inflammation, no malignancy.   TRANSURETHRAL RESECTION OF BLADDER TUMOR N/A 06/22/2015   Carcinoma in situ: no invasive malignancy.  Procedure: TRANSURETHRAL RESECTION OF BLADDER TUMOR (TURBT);  Surgeon: Gretel Renda, MD;  Location: [facility];  Service: Urology;  Laterality: N/A;  **BLADDER BIOPSY*RESECTION**   TRANSURETHRAL RESECTION OF BLADDER TUMOR N/A 11/17/2016   Path: BENIGN UROTHELIUM WITH INFLAMMATION--no malignancy. Procedure: TRANSURETHRAL RESECTION OF BLADDER TUMOR  (TURBT);  Surgeon: Gretel Renda, MD;  Location: [facility];  Service: Urology;  Laterality: N/A;   VASECTOMY       Current Outpatient Medications  Medication Sig Dispense Refill   acetaminophen  (TYLENOL ) 500 MG tablet Take 1,000 mg by mouth daily as needed for mild pain or headache.     aspirin  EC 81 MG tablet Take 81 mg by mouth daily.     atorvastatin  (LIPITOR ) 80 MG tablet Take 1 tablet (80 mg total) by mouth every evening. 90 tablet 3   calcium  carbonate (TUMS - DOSED IN MG ELEMENTAL CALCIUM ) 500 MG chewable tablet Chew 1 tablet by mouth daily as needed for indigestion or heartburn.     Dulaglutide  (TRULICITY ) 1.5 MG/0.5ML SOAJ Inject 1.5 mg into the skin once a week. 2 mL 5   lisinopril  (ZESTRIL ) 40 MG tablet Take 40 mg by mouth daily.     metFORMIN  (GLUCOPHAGE ) 1000 MG tablet Take 1 tablet (1,000 mg total) by mouth 2 (two) times daily with a meal. 180 tablet 3   metoprolol  succinate (TOPROL -XL) 50 MG 24 hr tablet TAKE 1  TABLET(50 MG) BY MOUTH DAILY WITH OR IMMEDIATELY FOLLOWING A MEAL 90 tablet 3   nitroGLYCERIN  (NITROSTAT ) 0.4 MG SL tablet PLACE 1 TABLET UNDER THE TONGUE EVERY 5 (FIVE) MINUTES AS NEEDED FOR CHEST PAIN (UP TO 3 DOSES). 25 tablet 3   rivaroxaban  (XARELTO ) 2.5 MG TABS tablet TAKE 1 TABLET(2.5 MG) BY MOUTH TWICE DAILY 180 tablet 1   tamsulosin  (FLOMAX ) 0.4 MG CAPS capsule TAKE 1 CAPSULE BY MOUTH EVERY DAY AFTER SUPPER 90 capsule 3   No current facility-administered medications for this visit.    Allergies:   Patient has no known allergies.    Social History:  The patient  reports that he quit smoking about 8 years ago. His smoking use included cigarettes. He started smoking about 48 years ago. He has a 10 pack-year smoking history. He has never used smokeless tobacco. He reports that he does not drink alcohol and does not use drugs.   Family History:  The patient's family history includes Cancer in his father; Diabetes in his brother and sister; Early death in his brother;  Heart disease in his brother and paternal uncle; Hypertension in his father and mother; Multiple sclerosis in his brother; Stroke in his mother.    ROS:  Please see the history of present illness.   Otherwise, review of systems are positive for none.   All other systems are reviewed and negative.    PHYSICAL EXAM: VS:  BP 127/80 (BP Location: Right Arm, Patient Position: Sitting, Cuff Size: Normal)   Pulse 70   Ht 5' 6 (1.676 m)   SpO2 96%   BMI 26.53 kg/m  , BMI Body mass index is 26.53 kg/m. GEN: Well nourished, well developed, in no acute distress  HEENT: normal  Neck: no JVD, carotid bruits, or masses Cardiac: RRR; no murmurs, rubs, or gallops,no edema  Respiratory:  clear to auscultation bilaterally, normal work of breathing GI: soft, nontender, nondistended, + BS MS: no deformity or atrophy  Skin: warm and dry, no rash Neuro:  Strength and sensation are intact Psych: euthymic mood, full affect    EKG:  EKG is ordered today. EKG showed : Normal sinus rhythm Left axis deviation When compared with ECG of 13-Jun-2023 09:36, No significant change was found      Recent Labs: 10/24/2023: Hemoglobin 15.9; Platelets 137 05/16/2024: ALT 15; BUN 16; Creatinine, Ser 1.02; Potassium 4.8; Sodium 138    Lipid Panel    Component Value Date/Time   CHOL 92 05/16/2024 0902   TRIG 85.0 05/16/2024 0902   HDL 33.50 (L) 05/16/2024 0902   CHOLHDL 3 05/16/2024 0902   VLDL 17.0 05/16/2024 0902   LDLCALC 41 05/16/2024 0902   LDLDIRECT 87.2 09/03/2010 1110      Wt Readings from Last 3 Encounters:  05/16/24 164 lb 6.4 oz (74.6 kg)  02/14/24 159 lb 6.4 oz (72.3 kg)  02/06/24 170 lb (77.1 kg)            No data to display            ASSESSMENT AND PLAN:  1.  Peripheral arterial disease with right calf claudication: due to an occluded right popliteal artery.  He reports minimal right calf claudication which is not lifestyle limiting.  Continue medical therapy.    He is  on long-term treatment with low-dose Xarelto  which seems to be well-tolerated.  If cost becomes an issue, we can consider switching to clopidogrel .  2.  Coronary artery disease involving native coronary arteries without angina:  Continue medical therapy.  3.  Mild ischemic cardiomyopathy: No signs of volume overload.  4.  Hyperlipidemia: Continue atorvastatin .  Most recent lipid profile last month showed an LDL of 41.    Disposition:   FU with me in 12 months  Signed,  Deatrice Cage, MD  06/11/2024 10:45 AM    [facility]

## 2024-06-11 ENCOUNTER — Ambulatory Visit: Attending: Cardiovascular Disease | Admitting: Cardiovascular Disease

## 2024-06-11 VITALS — BP 127/80 | HR 70 | Ht 66.0 in

## 2024-06-11 DIAGNOSIS — I739 Peripheral vascular disease, unspecified: Secondary | ICD-10-CM | POA: Diagnosis not present

## 2024-06-11 DIAGNOSIS — E785 Hyperlipidemia, unspecified: Secondary | ICD-10-CM | POA: Diagnosis not present

## 2024-06-11 DIAGNOSIS — I2510 Atherosclerotic heart disease of native coronary artery without angina pectoris: Secondary | ICD-10-CM | POA: Diagnosis not present

## 2024-06-11 DIAGNOSIS — I255 Ischemic cardiomyopathy: Secondary | ICD-10-CM | POA: Diagnosis not present

## 2024-06-11 NOTE — Patient Instructions (Signed)
 Medication Instructions:  No changes *If you need a refill on your cardiac medications before your next appointment, please call your pharmacy*  Lab Work: None ordered If you have labs (blood work) drawn today and your tests are completely normal, you will receive your results only by: MyChart Message (if you have MyChart) OR A paper copy in the mail If you have any lab test that is abnormal or we need to change your treatment, we will call you to review the results.  Testing/Procedures: None ordered  Follow-Up: At [facility], you and your health needs are our priority.  As part of our continuing mission to provide you with exceptional heart care, our providers are all part of one team.  This team includes your primary Cardiologist (physician) and Advanced Practice Providers or APPs (Physician Assistants and Nurse Practitioners) who all work together to provide you with the care you need, when you need it.  Your next appointment:   12 month(s)  Provider:   Dr. Alvenia Aus  We recommend signing up for the patient portal called "MyChart".  Sign up information is provided on this After Visit Summary.  MyChart is used to connect with patients for Virtual Visits (Telemedicine).  Patients are able to view lab/test results, encounter notes, upcoming appointments, etc.  Non-urgent messages can be sent to your provider as well.   To learn more about what you can do with MyChart, go to [email or web address].

## 2024-07-01 DIAGNOSIS — M170 Bilateral primary osteoarthritis of knee: Secondary | ICD-10-CM | POA: Diagnosis not present

## 2024-07-23 ENCOUNTER — Encounter: Payer: Self-pay | Admitting: Pharmacist

## 2024-07-23 NOTE — Progress Notes (Signed)
 Pharmacy Quality Measure Review  This patient is appearing on the insurance-providing list for being at risk of failing the adherence measure for Statin Therapy for Patients with Cardiovascular Disease [facility]) medications this calendar year.   Patient has filled atorvastatin  on time in 2025. He is taking appropriate strength for CV Disease - atorvastatin  80mg  daily.  Checked [employer] and patient is still showing as having an open SPC gap.    Dispenses   Dispensed Days Supply Quantity Provider Pharmacy  ATORVASTATIN  80MG  TABLETS 05/23/2024 90 90 each West, Katlyn D, NP [facility] #...  ATORVASTATIN  80MG  TABLETS 02/24/2024 90 90 each West, Katlyn D, NP [facility] #...  ATORVASTATIN  80MG  TABLETS 11/22/2023 90 90 each West, Katlyn D, NP [facility] #...  ATORVASTATIN  80MG  TABLETS 08/26/2023 90 90 each Crenshaw, Redell RAMAN, MD [facility] #...        No additional intervention needed at this time. Insurance report is not up to date  Madelin Ray, PharmD Clinical Pharmacist [facility]  Population Health [phone]

## 2024-07-30 ENCOUNTER — Telehealth: Payer: Self-pay

## 2024-07-30 ENCOUNTER — Other Ambulatory Visit (HOSPITAL_COMMUNITY): Payer: Self-pay

## 2024-07-30 NOTE — Telephone Encounter (Addendum)
 Copied from CRM [id number]. Topic: Clinical - Medical Advice >> Jul 30, 2024 12:37 PM Charolett L wrote: Reason for CRM: [employer] team [employer] is calling for the diagnoses for medication Dulaglutide  (TRULICITY ) 1.5 MG/0.5ML SOAJ..  CB# [phone] option#2   Spoke with representative, Prior Auth approved from July 30 2024 until July 30 2025.

## 2024-08-01 ENCOUNTER — Other Ambulatory Visit (HOSPITAL_COMMUNITY): Payer: Self-pay

## 2024-08-12 ENCOUNTER — Other Ambulatory Visit: Payer: Self-pay

## 2024-08-12 ENCOUNTER — Encounter: Payer: Self-pay | Admitting: Family Medicine

## 2024-08-12 ENCOUNTER — Encounter: Payer: Self-pay | Admitting: Cardiology

## 2024-08-12 DIAGNOSIS — I255 Ischemic cardiomyopathy: Secondary | ICD-10-CM

## 2024-08-12 MED ORDER — TRULICITY 1.5 MG/0.5ML ~~LOC~~ SOAJ: 1.5000 mg | SUBCUTANEOUS | 0 refills | Status: AC

## 2024-08-13 MED ORDER — METOPROLOL SUCCINATE ER 50 MG PO TB24: 50.0000 mg | ORAL_TABLET | Freq: Every day | ORAL | 3 refills | Status: DC

## 2024-08-16 ENCOUNTER — Ambulatory Visit: Admitting: Family Medicine

## 2024-08-16 ENCOUNTER — Encounter: Payer: Self-pay | Admitting: Family Medicine

## 2024-08-16 VITALS — BP 113/71 | HR 67 | Temp 96.6°F | Ht 66.0 in | Wt 166.2 lb

## 2024-08-16 DIAGNOSIS — E1129 Type 2 diabetes mellitus with other diabetic kidney complication: Secondary | ICD-10-CM | POA: Diagnosis not present

## 2024-08-16 DIAGNOSIS — E119 Type 2 diabetes mellitus without complications: Secondary | ICD-10-CM | POA: Diagnosis not present

## 2024-08-16 DIAGNOSIS — Z7984 Long term (current) use of oral hypoglycemic drugs: Secondary | ICD-10-CM | POA: Diagnosis not present

## 2024-08-16 DIAGNOSIS — E7800 Pure hypercholesterolemia, unspecified: Secondary | ICD-10-CM

## 2024-08-16 DIAGNOSIS — R809 Proteinuria, unspecified: Secondary | ICD-10-CM | POA: Diagnosis not present

## 2024-08-16 DIAGNOSIS — I10 Essential (primary) hypertension: Secondary | ICD-10-CM | POA: Diagnosis not present

## 2024-08-16 LAB — POCT GLYCOSYLATED HEMOGLOBIN (HGB A1C)
HbA1c POC (<> result, manual entry): 6.9 %
HbA1c, POC (controlled diabetic range): 6.9 % (ref 0.0–7.0)
HbA1c, POC (prediabetic range): 6.9 % — AB (ref 5.7–6.4)
Hemoglobin A1C: 6.9 % — AB (ref 4.0–5.6)

## 2024-08-16 NOTE — Progress Notes (Signed)
 OFFICE VISIT  08/16/2024  CC:  Chief Complaint  Patient presents with   Medical Management of Chronic Issues    Pt is fasting    Patient is a 69 y.o. male who presents for 73-month follow-up diabetes, hypertension, and hypercholesterolemia. A/P as of last visit: 1 diabetes with albuminuria. POC Hba1c today is 6.6%. His albuminuria has been pretty stable over the last couple of years. He did establish with nephrologist a few weeks ago.  No new medication was started but Bernadine was mentioned.  Patient wants to hold off on Jardiance right now.  If we did start this we would likely cut back his dose of metformin . For now continue metformin  1000 mg twice a day and Trulicity  1.5 mg weekly.  Also continue lisinopril  40 mg a day. Monitor renal function today and recheck urine microalbumin/creatinine.   2.  Hypertension, well-controlled on lisinopril  40 mg a day and Toprol -XL 50 mg daily. Electrolytes and creatinine today.   3.hypercholesterolemia. LDL goal less than 70.  Last LDL was 38 about 6 months ago. Continue atorvastatin  80 mg daily. Check lipid panel and hepatic panel today.   #4 chronic bilateral knee pain.  History of significant osteoarthritis.  He had established with an orthopedist years ago and says the plan was for knee replacement but his insurer would not cover it. He would like to reestablish with an orthopedist--> referral today.  INTERIM HX: [place] is doing well.  No acute concerns.  ROS --> no fevers, no CP, no SOB, no wheezing, no cough, no dizziness, no HAs, no rashes, no melena/hematochezia.  No polyuria or polydipsia.  No focal weakness, paresthesias, or tremors.  No acute vision or hearing abnormalities.  No dysuria or unusual/new urinary urgency or frequency.  No recent changes in lower legs. No n/v/d or abd pain.  No palpitations.    Past Medical History:  Diagnosis Date   Allergy 2000p   Arthritis    Ascending aorta dilatation    a. Ascending aorta  mildly dilated by echo 09/03/15.   Bladder cancer [facility]) 2012   Mass removed, then BCG by urologist (Dr. Renda).  Recurrence documented on cysto + bladder washings 10/14/15--getting more maintenance BCG.  Cysto 01/20/16 OK, then more BCG given.  Cysto 04/13/16 c/w just having finished BCG but cytology/FISH ok.  Cysto 07/27/2016 c/w small area of BCG related inflamm, BCG stopped due to severe sx's. 12/2020 cysto benign   Bladder cancer (HCC)    Surveillance cysto 11/02/16 cysto showed 1.5 cm raised erythematous lesion->bx benign. Subsequent cystoscopies unchanged, most recent 07/2019 and 04/2021.   BPH (benign prostatic hyperplasia)    with hx of acute prostatitis   CAD (coronary artery disease)    a. '05 MI > DES to circumflex. b. NSTEMI 08/2015 with total occlusion of the mid LAD along with 20% mid-distal Cx, 100% dCX, 40% D1, 50% pCx, 50% m-dRCA, 80% dRCA.Given >12 hours out from symptom onset, medical therapy recommended.   Diabetes mellitus type 2, controlled (HCC)    HbA1c 02/2011 was 6.6%   DVT (deep venous thrombosis) (HCC)    Elevated PSA 09/2011   with prostate nodule.  Prostate bx ALL BENIGN 10/10/11 (Dr. Lena followed by Urol--improved to 1.18 Jun 2017.  Nodule NOT palpable anymore on DRE at [facility] 01/2018 office f/u. Stable as of 08/2019. Slight incease 12/2020, urol to rpt 1 yr.   GERD (gastroesophageal reflux disease)    occasional   History of adenomatous polyp of colon 09/23/2020   recall  09/2025   History of bronchitis    History of vertigo    HTN (hypertension)    Hyperlipidemia    Ischemic cardiomyopathy    a. 2D Echo 09/03/15: mild LVH, EF 40-45%, diffuse HK, akinesis of apical myocardium, grade 1 DD, aortic root dimention 38mm, ascending aorta mildly dilated, mildly dilated RV/RA.    Myocardial infarction [facility]) 2005; 08/2015   followed by Dr. Pietro   PAD (peripheral artery disease) 06/2019   R LL abnl ABI and doppler->see PSH section of EMR for details.   Thrombocytopenia     Stable 2012-2017   Tobacco abuse    Wolf-Parkinson-White syndrome     Past Surgical History:  Procedure Laterality Date   BLADDER TUMOR EXCISION  11/08/10; 05/2015   urothelial carcinoma (Dr. Renda).  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   CARDIAC CATHETERIZATION  2005   with stent placement   CARDIAC CATHETERIZATION N/A 09/01/2015   Procedure: Left Heart Cath and Coronary Angiography;  Surgeon: Victory LELON Sharps, MD;  Location: [facility] INVASIVE CV LAB;  Service: Cardiovascular;  Laterality: N/A;   COLONOSCOPY  2010; 09/23/20   2010 Dr. Lorinda. 09/2020 Dr. Dal 09/2025.   CYSTOSCOPY W/ RETROGRADES Bilateral 06/22/2015   Procedure: CYSTOSCOPY WITH RETROGRADE PYELOGRAM;  Surgeon: Gretel Renda, MD;  Location: [facility] ORS;  Service: Urology;  Laterality: Bilateral;   CYSTOSCOPY W/ RETROGRADES N/A 11/17/2016   Procedure: CYSTOSCOPY WITH RETROGRADE PYELOGRAM;  Surgeon: Gretel Renda, MD;  Location: [facility];  Service: Urology;  Laterality: N/A;   CYSTOSCOPY WITH RETROGRADE PYELOGRAM, URETEROSCOPY AND STENT PLACEMENT N/A 06/06/2013   Procedure: CYSTOSCOPY WITH RETROGRADE PYELOGRAM;  Surgeon: Noretta Renda, MD;  Location: [facility];  Service: Urology;  Laterality: N/A;   LE ABI vascular studies  06/2019   R LL claudication + evidence of PAD (ABI very low AND popliteal and peroneal artery occlusion on dopler u/s) in R leg (L leg fine)->referred to Dr. Darron.  Conservative mgmt with walking program and low dose xarelto  first, then consider revascularization.   lexiscan   2012   Normal/negative   LUMBAR SPINE SURGERY  1989 & 1991   Neurosurgeon in [place] ([place])-no trouble since last surgery   myocardial perfusion study  2009   Neg ischemia, EF 52%   TRANSTHORACIC ECHOCARDIOGRAM     09/2022 EF 50-55%, apical hypokinesis, indeterm diast fxn, AV sclerosis w/out stenosis.  Ascending aorta dilation to 38 mm   TRANSURETHRAL RESECTION OF BLADDER TUMOR N/A  06/06/2013   Procedure: TRANSURETHRAL RESECTION OF BLADDER TUMOR (TURBT);  Surgeon: Noretta Renda, MD;  Location: [facility];  Service: Urology;  Laterality: N/A;.  PATH: squamous metaplasia, fibrosis with chronic inflammation, no malignancy.   TRANSURETHRAL RESECTION OF BLADDER TUMOR N/A 06/22/2015   Carcinoma in situ: no invasive malignancy.  Procedure: TRANSURETHRAL RESECTION OF BLADDER TUMOR (TURBT);  Surgeon: Gretel Renda, MD;  Location: [facility];  Service: Urology;  Laterality: N/A;  **BLADDER BIOPSY*RESECTION**   TRANSURETHRAL RESECTION OF BLADDER TUMOR N/A 11/17/2016   Path: BENIGN UROTHELIUM WITH INFLAMMATION--no malignancy. Procedure: TRANSURETHRAL RESECTION OF BLADDER TUMOR (TURBT);  Surgeon: Gretel Renda, MD;  Location: [facility];  Service: Urology;  Laterality: N/A;   VASECTOMY      Outpatient Medications Prior to Visit  Medication Sig Dispense Refill   acetaminophen  (TYLENOL ) 500 MG tablet Take 1,000 mg by mouth daily as needed for mild pain or headache.     aspirin  EC 81 MG tablet Take 81 mg by mouth daily.  atorvastatin  (LIPITOR ) 80 MG tablet Take 1 tablet (80 mg total) by mouth every evening. 90 tablet 3   calcium  carbonate (TUMS - DOSED IN MG ELEMENTAL CALCIUM ) 500 MG chewable tablet Chew 1 tablet by mouth daily as needed for indigestion or heartburn.     Dulaglutide  (TRULICITY ) 1.5 MG/0.5ML SOAJ Inject 1.5 mg into the skin once a week. 2 mL 0   lisinopril  (ZESTRIL ) 40 MG tablet Take 40 mg by mouth daily.     metFORMIN  (GLUCOPHAGE ) 1000 MG tablet Take 1 tablet (1,000 mg total) by mouth 2 (two) times daily with a meal. 180 tablet 3   metoprolol  succinate (TOPROL -XL) 50 MG 24 hr tablet Take 1 tablet (50 mg total) by mouth daily. Take with or immediately following a meal. 90 tablet 3   nitroGLYCERIN  (NITROSTAT ) 0.4 MG SL tablet PLACE 1 TABLET UNDER THE TONGUE EVERY 5 (FIVE) MINUTES AS NEEDED FOR CHEST PAIN (UP TO 3 DOSES). 25 tablet 3   rivaroxaban  (XARELTO ) 2.5 MG TABS tablet TAKE 1  TABLET(2.5 MG) BY MOUTH TWICE DAILY 180 tablet 1   tamsulosin  (FLOMAX ) 0.4 MG CAPS capsule TAKE 1 CAPSULE BY MOUTH EVERY DAY AFTER SUPPER 90 capsule 3   No facility-administered medications prior to visit.    Allergies[1]  Review of Systems As per HPI  PE:    08/16/2024   10:28 AM 06/11/2024   10:26 AM 05/16/2024    8:28 AM  Vitals with BMI  Height 5' 6 5' 6 5' 7  Weight 166 lbs 3 oz  164 lbs 6 oz  BMI 26.84  25.74  Systolic 113 127 871  Diastolic 71 80 75  Pulse 67 70 70     Physical Exam  Gen: Alert, well appearing.  Patient is oriented to person, place, time, and situation. AFFECT: pleasant, lucid thought and speech. CV: RRR, no m/r/g.   LUNGS: CTA bilat, nonlabored resps, good aeration in all lung fields. Extremities: No edema.  LABS:  Last CBC Lab Results  Component Value Date   WBC 8.3 10/24/2023   HGB 15.9 10/24/2023   HCT 46.7 10/24/2023   MCV 93 10/24/2023   MCH 31.7 10/24/2023   RDW 13.1 10/24/2023   PLT 137 (L) 10/24/2023   Last metabolic panel Lab Results  Component Value Date   GLUCOSE 137 (H) 05/16/2024   NA 138 05/16/2024   K 4.8 05/16/2024   CL 103 05/16/2024   CO2 27 05/16/2024   BUN 16 05/16/2024   CREATININE 1.02 05/16/2024   GFR 75.29 05/16/2024   CALCIUM  9.4 05/16/2024   PROT 6.4 05/16/2024   ALBUMIN 4.3 05/16/2024   BILITOT 0.7 05/16/2024   ALKPHOS 67 05/16/2024   AST 18 05/16/2024   ALT 15 05/16/2024   ANIONGAP 9 11/08/2016   Last lipids Lab Results  Component Value Date   CHOL 92 05/16/2024   HDL 33.50 (L) 05/16/2024   LDLCALC 41 05/16/2024   LDLDIRECT 87.2 09/03/2010   TRIG 85.0 05/16/2024   CHOLHDL 3 05/16/2024   Last hemoglobin A1c Lab Results  Component Value Date   HGBA1C 6.9 (A) 08/16/2024   HGBA1C 6.9 08/16/2024   HGBA1C 6.9 (A) 08/16/2024   HGBA1C 6.9 08/16/2024   Last thyroid  functions Lab Results  Component Value Date   TSH 0.96 08/26/2015   IMPRESSION AND PLAN:  1 diabetes with  albuminuria. POC Hba1c today is 6.9%. His albuminuria has been pretty stable over the last couple of years. He did establish with nephrologist a few weeks  ago.  No new medication was started but Bernadine was mentioned.  He held off at that time and continues to defer at this time.  If we did start this we would likely cut back his dose of metformin . For now continue metformin  1000 mg twice a day and Trulicity  1.5 mg weekly.  Also continue lisinopril  40 mg a day. Monitor renal function and recheck urine microalbumin/creatinine at next follow-up visit in 3 months.   2.  Hypertension, well-controlled on lisinopril  40 mg a day and Toprol -XL 50 mg daily.   3.hypercholesterolemia. LDL goal less than 70.  Last LDL was 41 about 3 months ago. Continue atorvastatin  80 mg daily. Check lipid panel and hepatic panel in 3 months.   #4 chronic bilateral knee pain.  History of significant osteoarthritis.  Since I last saw him he has reestablished with an orthopedist and says an injection in each knee has helped.   Electrolytes and creatinine have been consistently normal over the last few years, most recently 05/16/2024.  Will wait and repeat labs when we see him back in 3 months.  An After Visit Summary was printed and given to the patient.  FOLLOW UP: Return in about 4 months (around 12/14/2024) for annual CPE (fasting).  Next cpe 10/2024  Signed:  Gerlene Hockey, MD           08/16/2024     [1] No Known Allergies

## 2024-08-21 ENCOUNTER — Other Ambulatory Visit: Payer: Self-pay | Admitting: Cardiology

## 2024-08-21 DIAGNOSIS — I255 Ischemic cardiomyopathy: Secondary | ICD-10-CM

## 2024-08-26 MED ORDER — METOPROLOL SUCCINATE ER 50 MG PO TB24: 50.0000 mg | ORAL_TABLET | Freq: Every day | ORAL | 3 refills | Status: AC

## 2024-09-25 ENCOUNTER — Ambulatory Visit: Payer: Medicare Other

## 2024-10-29 ENCOUNTER — Ambulatory Visit: Payer: Self-pay | Admitting: Cardiology

## 2024-11-11 ENCOUNTER — Ambulatory Visit: Admitting: Family Medicine
